# Patient Record
Sex: Female | Born: 1985
Health system: Southern US, Community
[De-identification: ages and names within clinical notes are randomized; demographics above are authoritative.]

## PROBLEM LIST (undated history)

## (undated) DIAGNOSIS — T7840XA Allergy, unspecified, initial encounter: Secondary | ICD-10-CM

## (undated) DIAGNOSIS — C801 Malignant (primary) neoplasm, unspecified: Secondary | ICD-10-CM

## (undated) DIAGNOSIS — M2550 Pain in unspecified joint: Secondary | ICD-10-CM

## (undated) DIAGNOSIS — G43109 Migraine with aura, not intractable, without status migrainosus: Secondary | ICD-10-CM

## (undated) DIAGNOSIS — A64 Unspecified sexually transmitted disease: Secondary | ICD-10-CM

## (undated) DIAGNOSIS — Z862 Personal history of diseases of the blood and blood-forming organs and certain disorders involving the immune mechanism: Secondary | ICD-10-CM

## (undated) DIAGNOSIS — F419 Anxiety disorder, unspecified: Secondary | ICD-10-CM

## (undated) DIAGNOSIS — Z973 Presence of spectacles and contact lenses: Secondary | ICD-10-CM

## (undated) DIAGNOSIS — D649 Anemia, unspecified: Secondary | ICD-10-CM

## (undated) DIAGNOSIS — K638219 Small intestinal bacterial overgrowth, unspecified: Secondary | ICD-10-CM

## (undated) DIAGNOSIS — R87619 Unspecified abnormal cytological findings in specimens from cervix uteri: Secondary | ICD-10-CM

## (undated) DIAGNOSIS — Z803 Family history of malignant neoplasm of breast: Secondary | ICD-10-CM

## (undated) DIAGNOSIS — R7989 Other specified abnormal findings of blood chemistry: Secondary | ICD-10-CM

## (undated) DIAGNOSIS — Q796 Ehlers-Danlos syndrome, unspecified: Secondary | ICD-10-CM

## (undated) DIAGNOSIS — N946 Dysmenorrhea, unspecified: Secondary | ICD-10-CM

## (undated) DIAGNOSIS — R7303 Prediabetes: Secondary | ICD-10-CM

## (undated) DIAGNOSIS — D72829 Elevated white blood cell count, unspecified: Secondary | ICD-10-CM

## (undated) DIAGNOSIS — F329 Major depressive disorder, single episode, unspecified: Secondary | ICD-10-CM

## (undated) DIAGNOSIS — F32A Depression, unspecified: Secondary | ICD-10-CM

## (undated) DIAGNOSIS — Z9189 Other specified personal risk factors, not elsewhere classified: Secondary | ICD-10-CM

## (undated) DIAGNOSIS — Z8051 Family history of malignant neoplasm of kidney: Secondary | ICD-10-CM

## (undated) DIAGNOSIS — D3910 Neoplasm of uncertain behavior of unspecified ovary: Secondary | ICD-10-CM

## (undated) DIAGNOSIS — S73191A Other sprain of right hip, initial encounter: Secondary | ICD-10-CM

## (undated) DIAGNOSIS — N83209 Unspecified ovarian cyst, unspecified side: Secondary | ICD-10-CM

## (undated) DIAGNOSIS — J45909 Unspecified asthma, uncomplicated: Secondary | ICD-10-CM

## (undated) HISTORY — DX: Anxiety disorder, unspecified: F41.9

## (undated) HISTORY — DX: Unspecified sexually transmitted disease: A64

## (undated) HISTORY — DX: Major depressive disorder, single episode, unspecified: F32.9

## (undated) HISTORY — DX: Family history of malignant neoplasm of breast: Z80.3

## (undated) HISTORY — DX: Allergy, unspecified, initial encounter: T78.40XA

## (undated) HISTORY — DX: Malignant (primary) neoplasm, unspecified: C80.1

## (undated) HISTORY — DX: Migraine with aura, not intractable, without status migrainosus: G43.109

## (undated) HISTORY — PX: ADENOIDECTOMY: SUR15

## (undated) HISTORY — DX: Ehlers-Danlos syndrome, unspecified: Q79.60

## (undated) HISTORY — PX: TONSILLECTOMY: SUR1361

## (undated) HISTORY — DX: Other specified abnormal findings of blood chemistry: R79.89

## (undated) HISTORY — DX: Family history of malignant neoplasm of kidney: Z80.51

## (undated) HISTORY — DX: Anemia, unspecified: D64.9

## (undated) HISTORY — DX: Depression, unspecified: F32.A

## (undated) HISTORY — PX: UPPER GI ENDOSCOPY: SHX6162

## (undated) HISTORY — DX: Unspecified abnormal cytological findings in specimens from cervix uteri: R87.619

## (undated) HISTORY — PX: COLPOSCOPY: SHX161

## (undated) HISTORY — DX: Neoplasm of uncertain behavior of unspecified ovary: D39.10

## (undated) HISTORY — DX: Small intestinal bacterial overgrowth, unspecified: K63.8219

## (undated) HISTORY — DX: Other sprain of right hip, initial encounter: S73.191A

## (undated) HISTORY — DX: Other specified personal risk factors, not elsewhere classified: Z91.89

---
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
## 2004-04-03 ENCOUNTER — Other Ambulatory Visit: Admission: RE | Admit: 2004-04-03 | Discharge: 2004-04-03 | Payer: Self-pay | Admitting: Obstetrics and Gynecology

## 2005-06-25 ENCOUNTER — Emergency Department (HOSPITAL_COMMUNITY): Admission: EM | Admit: 2005-06-25 | Discharge: 2005-06-25 | Payer: Self-pay | Admitting: Emergency Medicine

## 2005-07-15 ENCOUNTER — Other Ambulatory Visit: Admission: RE | Admit: 2005-07-15 | Discharge: 2005-07-15 | Payer: Self-pay | Admitting: Obstetrics and Gynecology

## 2006-09-19 ENCOUNTER — Other Ambulatory Visit: Admission: RE | Admit: 2006-09-19 | Discharge: 2006-09-19 | Payer: Self-pay | Admitting: Obstetrics & Gynecology

## 2007-06-13 ENCOUNTER — Encounter: Admission: RE | Admit: 2007-06-13 | Discharge: 2007-06-13 | Payer: Self-pay | Admitting: Gastroenterology

## 2007-06-14 ENCOUNTER — Encounter (INDEPENDENT_AMBULATORY_CARE_PROVIDER_SITE_OTHER): Payer: Self-pay | Admitting: Gastroenterology

## 2007-08-23 ENCOUNTER — Emergency Department (HOSPITAL_COMMUNITY): Admission: EM | Admit: 2007-08-23 | Discharge: 2007-08-23 | Payer: Self-pay | Admitting: Emergency Medicine

## 2007-09-25 ENCOUNTER — Other Ambulatory Visit: Admission: RE | Admit: 2007-09-25 | Discharge: 2007-09-25 | Payer: Self-pay | Admitting: Obstetrics and Gynecology

## 2007-11-27 ENCOUNTER — Other Ambulatory Visit: Admission: RE | Admit: 2007-11-27 | Discharge: 2007-11-27 | Payer: Self-pay | Admitting: Obstetrics and Gynecology

## 2008-11-26 ENCOUNTER — Other Ambulatory Visit: Admission: RE | Admit: 2008-11-26 | Discharge: 2008-11-26 | Payer: Self-pay | Admitting: Obstetrics and Gynecology

## 2012-01-03 DIAGNOSIS — J189 Pneumonia, unspecified organism: Secondary | ICD-10-CM

## 2012-01-03 HISTORY — DX: Pneumonia, unspecified organism: J18.9

## 2012-01-05 ENCOUNTER — Ambulatory Visit: Payer: Self-pay | Admitting: Emergency Medicine

## 2012-01-05 ENCOUNTER — Ambulatory Visit: Payer: Self-pay

## 2012-01-05 VITALS — BP 106/61 | HR 94 | Temp 99.6°F | Resp 16 | Ht 68.0 in | Wt 137.0 lb

## 2012-01-05 DIAGNOSIS — R509 Fever, unspecified: Secondary | ICD-10-CM

## 2012-01-05 DIAGNOSIS — R059 Cough, unspecified: Secondary | ICD-10-CM

## 2012-01-05 DIAGNOSIS — R05 Cough: Secondary | ICD-10-CM

## 2012-01-05 DIAGNOSIS — J189 Pneumonia, unspecified organism: Secondary | ICD-10-CM

## 2012-01-05 DIAGNOSIS — J158 Pneumonia due to other specified bacteria: Secondary | ICD-10-CM

## 2012-01-05 LAB — POCT CBC
Granulocyte percent: 75.5 %G (ref 37–80)
HCT, POC: 35.4 % — AB (ref 37.7–47.9)
Hemoglobin: 11.8 g/dL — AB (ref 12.2–16.2)
Lymph, poc: 4.5 — AB (ref 0.6–3.4)
MCH, POC: 28.4 pg (ref 27–31.2)
MCHC: 33.3 g/dL (ref 31.8–35.4)
MCV: 85 fL (ref 80–97)
MID (cbc): 2.1 — AB (ref 0–0.9)
MPV: 9 fL (ref 0–99.8)
POC Granulocyte: 20.5 — AB (ref 2–6.9)
POC LYMPH PERCENT: 16.6 %L (ref 10–50)
POC MID %: 7.9 %M (ref 0–12)
Platelet Count, POC: 234 10*3/uL (ref 142–424)
RBC: 4.16 M/uL (ref 4.04–5.48)
RDW, POC: 13.9 %
WBC: 27.1 10*3/uL — AB (ref 4.6–10.2)

## 2012-01-05 MED ORDER — CEFTRIAXONE SODIUM 1 G IJ SOLR: 1.0000 g | Freq: Once | INTRAMUSCULAR | Status: AC

## 2012-01-05 MED ORDER — LEVOFLOXACIN 750 MG PO TABS: 750.0000 mg | ORAL_TABLET | Freq: Every day | ORAL | Status: AC

## 2012-01-05 NOTE — Progress Notes (Signed)
  Subjective:    Patient ID: Kathryn Park, female    DOB: 12-26-1985, 26 y.o.   MRN: [id number]  HPI 26 y/o WF presents with two week history of productive cough.  Now with fever Tmax 101F today.  Feels achy, tired.  Pain in left rib cage when breathing, but without dyspnea or wheeze.  No other URI symptoms.  Recent airplane travel to [place] for work.  Denies leg pain, swelling or redness.  Not on OCPs.  No FHx of DVT or clotting abnormality.     Review of Systems  Constitutional: Positive for fever and chills.  HENT: Positive for ear pain (fullness). Negative for nosebleeds, congestion and rhinorrhea.   Respiratory: Positive for cough. Negative for shortness of breath and wheezing.   Cardiovascular: Positive for chest pain (left posterior ribs when breathing).  Musculoskeletal: Negative.   Neurological: Negative.   Hematological: Negative.        Objective:   Physical Exam  Constitutional:       Appears acutely ill, skin warm, febrile  HENT:  Head: Normocephalic and atraumatic.  Right Ear: External ear normal.  Left Ear: External ear normal.  Mouth/Throat: Oropharynx is clear and moist.  Eyes: Pupils are equal, round, and reactive to light.  Neck: Normal range of motion. Neck supple.  Cardiovascular: Regular rhythm.   Pulmonary/Chest: Effort normal. No respiratory distress. She has no wheezes. She has rales (LLL).  Lymphadenopathy:    She has cervical adenopathy (shotty PC lymphadenopathy).    Results for orders placed in visit on 01/05/12  POCT CBC      Component Value Range   WBC 27.1 (*) 4.6 - 10.2 (K/uL)   Lymph, poc 4.5 (*) 0.6 - 3.4    POC LYMPH PERCENT 16.6  10 - 50 (%L)   MID (cbc) 2.1 (*) 0 - 0.9    POC MID % 7.9  0 - 12 (%M)   POC Granulocyte 20.5 (*) 2 - 6.9    Granulocyte percent 75.5  37 - 80 (%G)   RBC 4.16  4.04 - 5.48 (M/uL)   Hemoglobin 11.8 (*) 12.2 - 16.2 (g/dL)   HCT, POC 40.9 (*) 81.1 - 47.9 (%)   MCV 85.0  80 - 97 (fL)   MCH, POC 28.4  27 -  31.2 (pg)   MCHC 33.3  31.8 - 35.4 (g/dL)   RDW, POC 91.4     Platelet Count, POC 234  142 - 424 (K/uL)   MPV 9.0  0 - 99.8 (fL)   [facility] reading (PRIMARY) by  Dr Cleta Alberts [facility] Infiltrate       Assessment & Plan:  Cough.  Fever.  Myalgias.  Leukocytosis. Likely pneumococcal pneumonia.  Seen and discussed with Dr. Cleta Alberts. Treat with rocephin 1g IM now.  Levaquin 750mg  daily for seven days.  Return to clinic in 48 hours for recheck with Dr. Cleta Alberts, sooner if symptoms worsen.

## 2012-01-05 NOTE — Patient Instructions (Signed)

## 2012-01-07 ENCOUNTER — Ambulatory Visit: Payer: Self-pay | Admitting: Emergency Medicine

## 2012-01-07 VITALS — BP 114/73 | HR 67 | Temp 97.9°F | Resp 16 | Ht 65.5 in | Wt 138.2 lb

## 2012-01-07 DIAGNOSIS — J189 Pneumonia, unspecified organism: Secondary | ICD-10-CM

## 2012-01-07 NOTE — Progress Notes (Signed)
  Subjective:    Patient ID: Kathryn Park, female    DOB: 05/11/1986, 26 y.o.   MRN: [id number]  HPI she had followup pneumonia of her left lower lobe. A white count was significantly elevated. Her chest x-ray showed a consolidated left lower lobe pneumonia. She's feeling better. She has decreased the color of the sputum. She is not running fever today.    Review of Systems     Objective:   Physical Exam physical exam neck was supple chest exam reveals a few faint rales in the left base but much improved from 2 days ago. Cardiac exam is unremarkable.        Assessment & Plan:  Assessment is lateral pneumonia improved after Rocephin and Levaquin. We'll recheck x-ray in 10-14 days.

## 2013-06-18 ENCOUNTER — Emergency Department (INDEPENDENT_AMBULATORY_CARE_PROVIDER_SITE_OTHER)
Admission: EM | Admit: 2013-06-18 | Discharge: 2013-06-18 | Disposition: A | Discharge status: Home / Self Care | Payer: Self-pay | Source: Home / Self Care | Attending: Family Medicine | Admitting: Family Medicine

## 2013-06-18 ENCOUNTER — Encounter (HOSPITAL_COMMUNITY): Payer: Self-pay | Admitting: Emergency Medicine

## 2013-06-18 DIAGNOSIS — B009 Herpesviral infection, unspecified: Secondary | ICD-10-CM

## 2013-06-18 DIAGNOSIS — B001 Herpesviral vesicular dermatitis: Secondary | ICD-10-CM

## 2013-06-18 MED ORDER — VALACYCLOVIR HCL 1 G PO TABS: ORAL_TABLET | ORAL | Status: DC

## 2013-06-18 NOTE — ED Notes (Signed)
C/o recurrent cold sores over the past month. States this is the third outbreak. Pt has tried abreva with no relief in symptoms.

## 2013-06-18 NOTE — ED Provider Notes (Signed)
CSN: [id number]     Arrival date & time 06/18/13  0912 History   First MD Initiated Contact with Patient 06/18/13 [phone]     Chief Complaint  Patient presents with  . Mouth Lesions    recurrent over the past month. abreeva not working.    (Consider location/radiation/quality/duration/timing/severity/associated sxs/prior Treatment) HPI Comments: 27 year old female presents complaining of recurrent cold sores over the past 2 months. She has a long history of cold sores that she usually treats by taking oral lysine and by covering the sore with Abreva. She has been doing this each time. She usually has this only one time yearly, but she has had 3 times over the past 2 months and has just another one pop up this morning. She is a small blister on her upper lip and she feels all her facial nerves tingling. She states this is always the beginning of what leads to a cold sore. She states these are no different from a cold sore she has ever had in the past. In the past, she is taking Valtrex which was helpful. She says she is here specifically because she has a cold sore, but specifically because she keeps having recurrent cold sores. She's never had this happen in the past. She denies any other symptoms apart from a cold sores.  Patient is a 27 y.o. female presenting with mouth sores.  Mouth Lesions Associated symptoms: no fever and no rash     History reviewed. No pertinent past medical history. History reviewed. No pertinent past surgical history. History reviewed. No pertinent family history. History  Substance Use Topics  . Smoking status: Current Some Day Smoker -- 1.00 packs/day    Types: Cigarettes  . Smokeless tobacco: Not on file  . Alcohol Use: Yes   OB History   Grav Para Term Preterm Abortions TAB SAB Ect Mult Living                 Review of Systems  Constitutional: Negative for fever and chills.  HENT: Positive for mouth sores.   Eyes: Negative for visual disturbance.    Respiratory: Negative for cough and shortness of breath.   Cardiovascular: Negative for chest pain, palpitations and leg swelling.  Gastrointestinal: Negative for nausea, vomiting and abdominal pain.  Endocrine: Negative for polydipsia and polyuria.  Genitourinary: Negative for dysuria, urgency and frequency.  Musculoskeletal: Negative for myalgias and arthralgias.  Skin: Negative for rash.  Neurological: Negative for dizziness, weakness and light-headedness.    Allergies  Latex and Sulfa antibiotics  Home Medications   Current Outpatient Rx  Name  Route  Sig  Dispense  Refill  . valACYclovir (VALTREX) 1000 MG tablet      1 tab PO TID for 7 days, then 1 tab PO QD for suppression   42 tablet   0    BP 117/75  Pulse 68  Temp(Src) 98.2 F (36.8 C)  Resp 16  SpO2 100%  LMP 06/16/2013 Physical Exam  Nursing note and vitals reviewed. Constitutional: She is oriented to person, place, and time. Vital signs are normal. She appears well-developed and well-nourished. No distress.  HENT:  Head: Normocephalic and atraumatic.  Mouth/Throat:    Pulmonary/Chest: Effort normal. No respiratory distress.  Neurological: She is alert and oriented to person, place, and time. She has normal strength. Coordination normal.  Skin: Skin is warm and dry. No rash noted. She is not diaphoretic.  Psychiatric: She has a normal mood and affect. Judgment normal.  ED Course  Procedures (including critical care time) Labs Review Labs Reviewed - No data to display Imaging Review No results found.  MDM   1. Herpes simplex labialis    Treating this with Valtrex 3 times a day, then continue Valtrex for 3 more weeks once daily for suppression. Continue to use Abreva and lysine. If this does not result in resolution of her recurrent cold sores, she will followup with her primary care doctor for evaluation, possible immune deficiency.   Meds ordered this encounter  Medications  . valACYclovir  (VALTREX) 1000 MG tablet    Sig: 1 tab PO TID for 7 days, then 1 tab PO QD for suppression    Dispense:  42 tablet    Refill:  0       Graylon Good, PA-C 06/18/13 1045

## 2013-06-19 NOTE — ED Provider Notes (Signed)
Medical screening examination/treatment/procedure(s) were performed by a resident physician or non-physician practitioner and as the supervising physician I was immediately available for consultation/collaboration.  Evan Corey, MD    Evan S Corey, MD 06/19/13 1335 

## 2013-10-04 DIAGNOSIS — R7303 Prediabetes: Secondary | ICD-10-CM

## 2013-10-04 HISTORY — DX: Prediabetes: R73.03

## 2018-03-28 ENCOUNTER — Other Ambulatory Visit (HOSPITAL_COMMUNITY)
Admission: RE | Admit: 2018-03-28 | Discharge: 2018-03-28 | Disposition: A | Discharge status: Home / Self Care | Payer: No Typology Code available for payment source | Source: Ambulatory Visit | Attending: Obstetrics and Gynecology | Admitting: Obstetrics and Gynecology

## 2018-03-28 ENCOUNTER — Ambulatory Visit: Payer: No Typology Code available for payment source | Admitting: Obstetrics and Gynecology

## 2018-03-28 ENCOUNTER — Other Ambulatory Visit: Payer: Self-pay

## 2018-03-28 ENCOUNTER — Encounter: Payer: Self-pay | Admitting: Obstetrics and Gynecology

## 2018-03-28 VITALS — BP 120/68 | HR 70 | Resp 16 | Ht 67.25 in | Wt 121.0 lb

## 2018-03-28 DIAGNOSIS — Z832 Family history of diseases of the blood and blood-forming organs and certain disorders involving the immune mechanism: Secondary | ICD-10-CM | POA: Diagnosis not present

## 2018-03-28 DIAGNOSIS — Z01419 Encounter for gynecological examination (general) (routine) without abnormal findings: Secondary | ICD-10-CM | POA: Diagnosis not present

## 2018-03-28 DIAGNOSIS — N6310 Unspecified lump in the right breast, unspecified quadrant: Secondary | ICD-10-CM

## 2018-03-28 MED ORDER — VALACYCLOVIR HCL 1 G PO TABS: ORAL_TABLET | ORAL | 1 refills | Status: DC

## 2018-03-28 NOTE — Progress Notes (Signed)
32 y.o. G15P0000 Married Caucasian female here for annual exam.    Lump in her right breast for one week. No pain where the lump is but has pain on the other side of the same breast.  Hx right breast cyst since 2014, but this is in a different location.   Felt sad when she took OCPs in the past.   Lives in [place] for 3 years.   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   PCP:   None.   Patient's last menstrual period was 03/04/2018 (exact date).           Sexually active: Yes.    The current method of family planning is condoms all the time.  Satisfied with this.  Exercising: Yes.    yoga Smoker:  vapes  Health Maintenance: Pap:  2015 neg? History of abnormal Pap:  In the past.  Biopsy done and thinks it was maybe low grade. Follow ups normal.   MMG:  none Colonoscopy:  none BMD:   n/a  Result  n/a TDaP:  2017 Gardasil:   yes HIV: not done Hep C: not done Screening Labs: ---   reports that she has quit smoking. Her smoking use included cigarettes. She smoked 1.00 pack per day. She has never used smokeless tobacco. She reports that she drinks about 2.4 oz of alcohol per week. She reports that she does not use drugs.  Past Medical History:  Diagnosis Date  . Abnormal Pap smear of cervix   . Anxiety   . Depression   . STD (sexually transmitted disease)    HSV1    Past Surgical History:  Procedure Laterality Date  . COLPOSCOPY    . TONSILLECTOMY      Current Outpatient Medications  Medication Sig Dispense Refill  . valACYclovir (VALTREX) 1000 MG tablet 1 tab PO TID for 7 days, then 1 tab PO QD for suppression 42 tablet 0   No current facility-administered medications for this visit.     Family History  Problem Relation Age of Onset  . Diabetes Mother   . Hypertension Mother   . Stroke Mother        TIA  . Factor V Leiden deficiency Mother   . Other Father        kidney tumor  . Cancer Father        cancerous tumor  . Crohn's disease  Father   . Breast cancer Maternal Grandmother   . Stroke Maternal Grandfather     Review of Systems  Constitutional: Negative.   HENT: Negative.   Eyes: Negative.   Respiratory: Negative.   Cardiovascular: Negative.   Gastrointestinal: Negative.   Endocrine: Negative.   Genitourinary: Negative.   Musculoskeletal: Negative.   Skin:       Breast lump rt side  Allergic/Immunologic: Negative.   Neurological: Negative.   Hematological: Negative.   Psychiatric/Behavioral: Negative.     Exam:   BP 120/68   Pulse 70   Resp 16   Ht 5' 7.25" (1.708 m)   Wt 121 lb (54.9 kg)   LMP 03/04/2018 (Exact Date)   BMI 18.81 kg/m     General appearance: alert, cooperative and appears stated age Head: Normocephalic, without obvious abnormality, atraumatic Neck: no adenopathy, supple, symmetrical, trachea midline and thyroid normal to inspection and palpation Lungs: clear to auscultation bilaterally Breasts: normal appearance, no masses or tenderness on left.  Questionable right breast mass at 10:00 1.5 cm, mobile, No nipple retraction  or dimpling, No nipple discharge or bleeding, No axillary or supraclavicular adenopathy Heart: regular rate and rhythm Abdomen: soft, non-tender; no masses, no organomegaly Extremities: extremities normal, atraumatic, no cyanosis or edema Skin: Skin color, texture, turgor normal. No rashes or lesions Lymph nodes: Cervical, supraclavicular, and axillary nodes normal. No abnormal inguinal nodes palpated Neurologic: Grossly normal  Pelvic: External genitalia:  no lesions              Urethra:  normal appearing urethra with no masses, tenderness or lesions              Bartholins and Skenes: normal                 Vagina: normal appearing vagina with normal color and discharge, no lesions              Cervix: no lesions              Pap taken: Yes.   Bimanual Exam:  Uterus:  normal size, contour, position, consistency, mobility, non-tender              Adnexa:  no mass, fullness, tenderness             Chaperone was present for exam.  Assessment:   Well woman visit with normal exam. Questionable right breat lump. Hx abnormal pap in remote past.  HSV 1. Mother with Factor V Leiden.  Patient has not been tested.   Plan: Mammogram bilateral dx and right breast US.  Recommended self breast awareness. Pap and HR HPV as above. Guidelines for Calcium, Vitamin D, regular exercise program including cardiovascular and weight bearing exercise. Routine labs.  Refill of Valtrex.  Referral to see Hematology for Factor V Leiden testing.  Follow up annually and prn.   After visit summary provided.

## 2018-03-28 NOTE — Patient Instructions (Signed)

## 2018-03-28 NOTE — Progress Notes (Signed)
Scheduled patient while in office for bilateral diagnostic mammogram and [facility] at 1 pm. Patient is agreeable to date and time. Placed in mammogram hold.

## 2018-03-29 ENCOUNTER — Telehealth: Payer: Self-pay | Admitting: *Deleted

## 2018-03-29 ENCOUNTER — Ambulatory Visit
Admission: RE | Admit: 2018-03-29 | Discharge: 2018-03-29 | Disposition: A | Discharge status: Home / Self Care | Payer: No Typology Code available for payment source | Source: Ambulatory Visit | Attending: Obstetrics and Gynecology | Admitting: Obstetrics and Gynecology

## 2018-03-29 ENCOUNTER — Other Ambulatory Visit: Payer: Self-pay | Admitting: Obstetrics and Gynecology

## 2018-03-29 DIAGNOSIS — N6310 Unspecified lump in the right breast, unspecified quadrant: Secondary | ICD-10-CM

## 2018-03-29 DIAGNOSIS — R928 Other abnormal and inconclusive findings on diagnostic imaging of breast: Secondary | ICD-10-CM

## 2018-03-29 HISTORY — DX: Elevated white blood cell count, unspecified: D72.829

## 2018-03-29 LAB — COMPREHENSIVE METABOLIC PANEL
ALT: 9 IU/L (ref 0–32)
AST: 16 IU/L (ref 0–40)
Albumin/Globulin Ratio: 2.2 (ref 1.2–2.2)
Albumin: 4.6 g/dL (ref 3.5–5.5)
Alkaline Phosphatase: 47 IU/L (ref 39–117)
BUN/Creatinine Ratio: 23 (ref 9–23)
BUN: 15 mg/dL (ref 6–20)
Bilirubin Total: 0.2 mg/dL (ref 0.0–1.2)
CO2: 20 mmol/L (ref 20–29)
Calcium: 9.2 mg/dL (ref 8.7–10.2)
Chloride: 106 mmol/L (ref 96–106)
Creatinine, Ser: 0.65 mg/dL (ref 0.57–1.00)
GFR calc Af Amer: 136 mL/min/{1.73_m2} (ref >59)
GFR calc non Af Amer: 118 mL/min/{1.73_m2} (ref >59)
Globulin, Total: 2.1 g/dL (ref 1.5–4.5)
Glucose: 96 mg/dL (ref 65–99)
Potassium: 4.6 mmol/L (ref 3.5–5.2)
Sodium: 138 mmol/L (ref 134–144)
Total Protein: 6.7 g/dL (ref 6.0–8.5)

## 2018-03-29 LAB — LIPID PANEL
Chol/HDL Ratio: 2 ratio (ref 0.0–4.4)
Cholesterol, Total: 138 mg/dL (ref 100–199)
HDL: 69 mg/dL (ref >39)
LDL Calculated: 51 mg/dL (ref 0–99)
Triglycerides: 88 mg/dL (ref 0–149)
VLDL Cholesterol Cal: 18 mg/dL (ref 5–40)

## 2018-03-29 LAB — CBC
Hematocrit: 40.2 % (ref 34.0–46.6)
Hemoglobin: 13.7 g/dL (ref 11.1–15.9)
MCH: 29.7 pg (ref 26.6–33.0)
MCHC: 34.1 g/dL (ref 31.5–35.7)
MCV: 87 fL (ref 79–97)
Platelets: 222 10*3/uL (ref 150–450)
RBC: 4.61 x10E6/uL (ref 3.77–5.28)
RDW: 14.1 % (ref 12.3–15.4)
WBC: 11.2 10*3/uL — ABNORMAL HIGH (ref 3.4–10.8)

## 2018-03-29 NOTE — Telephone Encounter (Signed)
-----   Message from Nunzio Cobbs, MD sent at 03/29/2018  5:21 PM EDT ----- Please report results of testing to patient.  She had mildly elevated white blood cells.  This was quite elevated many years ago on chart review, and I am uncertain of the reason.   Otherwise, her blood work show normal blood chemistries and cholesterol. I have placed a referral for the patient to see a hematologist for her FH of factor V Leiden mutation.  At the same time, the hematologist can recheck her CBC.   Her pap is pending.

## 2018-03-29 NOTE — Telephone Encounter (Signed)
Call to patient to review lab results. Per ROI can leave message on voice mail. Call log confirms correct number. Left message calling to review results. Nothing wrong, no emergency. Call in am and ask for triage nurse.

## 2018-03-30 LAB — CYTOLOGY - PAP
Diagnosis: NEGATIVE
HPV: NOT DETECTED

## 2018-03-30 NOTE — Telephone Encounter (Signed)
Call to patient. Advised of test results as directed by Dr Quincy Simmonds. Hematology referral is in place. Advised to call back if no appointment info received in one week.  Patient reports that breast ultrasound revealed inflamed lymph node and perhaps this is related to elevated WBC. Advised will update Dr Quincy Simmonds. Proceed with referral as planned.   Routing to provider for final review.  Will close encounter.

## 2018-03-30 NOTE — Telephone Encounter (Signed)
Patient returning call for triage nurse.

## 2018-04-10 ENCOUNTER — Telehealth: Payer: Self-pay | Admitting: Obstetrics and Gynecology

## 2018-04-10 DIAGNOSIS — Z832 Family history of diseases of the blood and blood-forming organs and certain disorders involving the immune mechanism: Secondary | ICD-10-CM

## 2018-04-10 NOTE — Telephone Encounter (Signed)
Left message to call Jaevin Medearis at [phone].  

## 2018-04-10 NOTE — Telephone Encounter (Signed)
Spoke with patient, advised as seen below per Dr. Quincy Simmonds. Patient request to return to [facility] for labs, has not established new PCP in [place]. Lab appt scheduled for 04/17/18 at 11:30am. Patient verbalizes understanding and is agreeable.   Future lab order placed for Factor V Leiden and pended.   Dr. Quincy Simmonds -please review pended lab.

## 2018-04-10 NOTE — Telephone Encounter (Signed)
Ok for testing in our office for Factor V Leiden. Please send through the order, and I will sign it.

## 2018-04-10 NOTE — Telephone Encounter (Signed)
Please let patient know that Dr. Marin Olp from Hematology/Oncology is recommending she have Factor V Leiden testing done through her PCP.  I am happy to have [place] do it here, but she lives in [place].  She may wish to have a PCP do it there.  If she tests positive, then a consultation with the Hematologist/Oncologist would be indicated.

## 2018-04-11 NOTE — Telephone Encounter (Signed)
Lab order placed. Encounter closed.

## 2018-04-17 ENCOUNTER — Other Ambulatory Visit (INDEPENDENT_AMBULATORY_CARE_PROVIDER_SITE_OTHER): Payer: No Typology Code available for payment source

## 2018-04-17 DIAGNOSIS — Z832 Family history of diseases of the blood and blood-forming organs and certain disorders involving the immune mechanism: Secondary | ICD-10-CM

## 2018-04-21 LAB — FACTOR 5 LEIDEN

## 2018-04-23 ENCOUNTER — Encounter: Payer: Self-pay | Admitting: Obstetrics and Gynecology

## 2018-05-11 ENCOUNTER — Telehealth: Payer: Self-pay | Admitting: Obstetrics and Gynecology

## 2018-05-11 NOTE — Telephone Encounter (Signed)
Left message for patient to reschedule appointment.

## 2018-10-10 ENCOUNTER — Ambulatory Visit
Admission: RE | Admit: 2018-10-10 | Discharge: 2018-10-10 | Disposition: A | Discharge status: Home / Self Care | Payer: No Typology Code available for payment source | Source: Ambulatory Visit | Attending: Obstetrics and Gynecology | Admitting: Obstetrics and Gynecology

## 2018-10-10 DIAGNOSIS — R928 Other abnormal and inconclusive findings on diagnostic imaging of breast: Secondary | ICD-10-CM

## 2019-04-12 ENCOUNTER — Ambulatory Visit: Payer: No Typology Code available for payment source | Admitting: Obstetrics and Gynecology

## 2019-08-28 ENCOUNTER — Telehealth: Payer: Self-pay

## 2019-08-28 NOTE — Telephone Encounter (Signed)
Please contact patient to schedule 6 month breast imaging. Recall date of 04/08/19.

## 2019-09-12 NOTE — Telephone Encounter (Signed)
Left message to call Enslee Bibbins, CMA. °

## 2019-09-20 ENCOUNTER — Other Ambulatory Visit: Payer: Self-pay | Admitting: Obstetrics and Gynecology

## 2019-09-20 DIAGNOSIS — N6489 Other specified disorders of breast: Secondary | ICD-10-CM

## 2019-09-20 NOTE — Telephone Encounter (Signed)
Spoke with patient, she will call today to schedule follow up MMG. She states she hasn't had insurance, but will have new policy after [id number]. She is having other health issues and would like to see Dr.Silva the same day of MMG since she lives in [place], [place]. She will call back after has date of MMG and we will try to schedule appt.with Dr.Silva for the same day.

## 2019-09-20 NOTE — Telephone Encounter (Signed)
Patient returned call

## 2019-09-21 NOTE — Telephone Encounter (Signed)
Per review of Epic, patient is scheduled for Dx breast imaging on 10/11/19 at 11am.   Call to patient, AEX scheduled for 10/11/19 at 9:30am with Dr. Quincy Simmonds. [id number] precautions reviewed. Patient verbalizes understanding and is agreeable.   Routing to provider for final review. Patient is agreeable to disposition. Will close encounter.

## 2019-09-21 NOTE — Telephone Encounter (Signed)
Orders faxed to [facility].

## 2019-09-21 NOTE — Telephone Encounter (Signed)
Please contact patient in follow up to her breast imaging order I just signed.   She also needs to schedule an annual exam with me.

## 2019-09-26 NOTE — Telephone Encounter (Signed)
Moved from current recall to 04/2019 MMG recall.BS/TF/ald

## 2019-10-05 DIAGNOSIS — C569 Malignant neoplasm of unspecified ovary: Secondary | ICD-10-CM

## 2019-10-05 HISTORY — DX: Malignant neoplasm of unspecified ovary: C56.9

## 2019-10-10 ENCOUNTER — Other Ambulatory Visit: Payer: Self-pay

## 2019-10-10 NOTE — Progress Notes (Signed)
34 y.o. G0P0000 Married [place] female here for annual exam.    She has noticed some lymph nodes in her groin.  She is having joint pain.  Lost hair.  Dry skin, mouth, and eye. Having mood swings and feeling very low, like she felt on birth control.  Not have HSV oral outbreaks.   Menses are regular but have changed.  Bleeds for a total of 6 days.  Has one day of debilitating cramps. She vomits.  Use heating pad.  Ibuprofen helps some.  Denies painful intercourse.  Not working for the last year.   Mom passed 01/2019 from brain bleed.  Patient lives in [place].   PCP: None  Patient's last menstrual period was 10/02/2019 (exact date).           Sexually active: Yes.    The current method of family planning is condoms everytime.    Exercising: Yes.    yoga Smoker:  no  Health Maintenance: Pap: 03-28-18 Neg:Neg HR HPV, 2015 ?Neg History of abnormal Pap:  Yes.in her early 20's had colposcopy but no treatment. MMG: 10-10-18 Rt.Diag./density D/stable benign-appearing Rt.Br.asymmetry./Neg/rec.Bil.Diag in 6 mo.to demonstrate 1 yr.stability/BiRads3--appt.10-11-19 Colonoscopy:  n/a BMD:   n/a  Result  n/a TDaP:  2017 Gardasil:   yes HIV:no Hep C:no Screening Labs:  Today.  Flu vaccine:  Completed.   reports that she has quit smoking. Her smoking use included cigarettes. She smoked 1.00 pack per day. She has never used smokeless tobacco. She reports previous alcohol use. She reports that she does not use drugs.  Past Medical History:  Diagnosis Date  . Abnormal Pap smear of cervix   . Anxiety   . Depression   . Leukocytosis   . STD (sexually transmitted disease)    HSV1    Past Surgical History:  Procedure Laterality Date  . COLPOSCOPY    . TONSILLECTOMY      Current Outpatient Medications  Medication Sig Dispense Refill  . valACYclovir (VALTREX) 1000 MG tablet Take 2 tablets (2000 mg) by mouth twice a day for 24 hours. 30 tablet 1   No current facility-administered  medications for this visit.    Family History  Problem Relation Age of Onset  . Diabetes Mother   . Hypertension Mother   . Stroke Mother        TIA  . Factor V Leiden deficiency Mother   . Other Father        kidney tumor  . Cancer Father        cancerous tumor of kidney  . Crohn's disease Father   . Breast cancer Maternal Grandmother   . Stroke Maternal Grandfather     Review of Systems  Constitutional:       Hair loss Increased acne on face, chest and back  Eyes:       Dry eyes  Gastrointestinal: Positive for constipation.       Bloating  Genitourinary:       Slow to start stream to urinate  Musculoskeletal: Positive for arthralgias.       Joint aches  Skin:       Dry skin    Exam:   BP 100/60   Pulse 66   Temp (!) 97.1 F (36.2 C) (Temporal)   Resp 20   Ht 5\' 7"  (1.702 m)   Wt 133 lb 9.6 oz (60.6 kg)   LMP 10/02/2019 (Exact Date)   BMI 20.92 kg/m     General appearance: alert, cooperative and appears  stated age Head: normocephalic, without obvious abnormality, atraumatic Neck: no adenopathy, supple, symmetrical, trachea midline and thyroid normal to inspection and palpation Lungs: clear to auscultation bilaterally Breasts: normal appearance, no masses or tenderness, No nipple retraction or dimpling, No nipple discharge or bleeding, No axillary adenopathy Heart: regular rate and rhythm Abdomen: soft, non-tender; no masses, no organomegaly Extremities: extremities normal, atraumatic, no cyanosis or edema Skin: skin color, texture, turgor normal. No rashes or lesions Lymph nodes: cervical, supraclavicular, and axillary nodes normal. Neurologic: grossly normal Inguinal nodes:  Bilateral node on each side about 7 mm, nontender.  Pelvic: External genitalia:  no lesions              No abnormal inguinal nodes palpated.              Urethra:  normal appearing urethra with no masses, tenderness or lesions              Bartholins and Skenes: normal                  Vagina: normal appearing vagina with normal color and discharge, no lesions              Cervix: no lesions              Pap taken: No. Bimanual Exam:  Uterus:  normal size, contour, position, consistency, mobility, non-tender              Adnexa: no mass, fullness, tenderness on right.  Mass in cul de sac, feels heavy, tender to touch.                 Chaperone was present for exam.  Assessment:   Well woman visit with normal exam. Malaise.  Dysmenorrhea.  Pelvic mass. Hx HSV.  FH Factor V Leiden.  Patient is negative. Bereavement.   Plan: Mammogram today. Self breast awareness reviewed. Pap and HR HPV as above. Guidelines for Calcium, Vitamin D, regular exercise program including cardiovascular and weight bearing exercise. Routine comprehensive labs.  Return for pelvic US.  We did discuss ovarian cysts and fibroids as a cause for a pelvic mass. Support give for the loss of the patient's mother.  We discussed Hospice a resource for her. Follow up annually and prn.   After visit summary provided.

## 2019-10-11 ENCOUNTER — Ambulatory Visit: Payer: BC Managed Care – PPO | Admitting: Obstetrics and Gynecology

## 2019-10-11 ENCOUNTER — Ambulatory Visit
Admission: RE | Admit: 2019-10-11 | Discharge: 2019-10-11 | Disposition: A | Discharge status: Home / Self Care | Payer: BC Managed Care – PPO | Source: Ambulatory Visit | Attending: Obstetrics and Gynecology | Admitting: Obstetrics and Gynecology

## 2019-10-11 ENCOUNTER — Ambulatory Visit: Payer: Self-pay

## 2019-10-11 ENCOUNTER — Encounter: Payer: Self-pay | Admitting: Obstetrics and Gynecology

## 2019-10-11 ENCOUNTER — Telehealth: Payer: Self-pay | Admitting: Obstetrics and Gynecology

## 2019-10-11 VITALS — BP 100/60 | HR 66 | Temp 97.1°F | Resp 20 | Ht 67.0 in | Wt 133.6 lb

## 2019-10-11 DIAGNOSIS — N946 Dysmenorrhea, unspecified: Secondary | ICD-10-CM

## 2019-10-11 DIAGNOSIS — N6489 Other specified disorders of breast: Secondary | ICD-10-CM

## 2019-10-11 DIAGNOSIS — Z01419 Encounter for gynecological examination (general) (routine) without abnormal findings: Secondary | ICD-10-CM | POA: Diagnosis not present

## 2019-10-11 DIAGNOSIS — R1900 Intra-abdominal and pelvic swelling, mass and lump, unspecified site: Secondary | ICD-10-CM | POA: Diagnosis not present

## 2019-10-11 NOTE — Telephone Encounter (Signed)
Call placed to patient to review benefit and schedule recommended ultrasound with Dr Quincy Simmonds. Left voicemail message requesting a return call.

## 2019-10-11 NOTE — Telephone Encounter (Signed)
Patient returned call. Reviewed benefit for recommended ultrasound. Patient acknowledges understanding of information presented. Patient is scheduled 10/18/2019 with Dr Quincy Simmonds. Patient is aware of appointment date, arrival time and cancellation policy. No further questions. Will close encounter

## 2019-10-11 NOTE — Patient Instructions (Signed)

## 2019-10-12 LAB — CBC WITH DIFFERENTIAL/PLATELET
Basophils Absolute: 0 10*3/uL (ref 0.0–0.2)
Basos: 1 %
EOS (ABSOLUTE): 0.2 10*3/uL (ref 0.0–0.4)
Eos: 2 %
Hematocrit: 41.2 % (ref 34.0–46.6)
Hemoglobin: 14.2 g/dL (ref 11.1–15.9)
Immature Grans (Abs): 0 10*3/uL (ref 0.0–0.1)
Immature Granulocytes: 0 %
Lymphocytes Absolute: 2.7 10*3/uL (ref 0.7–3.1)
Lymphs: 42 %
MCH: 29.8 pg (ref 26.6–33.0)
MCHC: 34.5 g/dL (ref 31.5–35.7)
MCV: 86 fL (ref 79–97)
Monocytes Absolute: 0.5 10*3/uL (ref 0.1–0.9)
Monocytes: 7 %
Neutrophils Absolute: 3 10*3/uL (ref 1.4–7.0)
Neutrophils: 48 %
Platelets: 232 10*3/uL (ref 150–450)
RBC: 4.77 x10E6/uL (ref 3.77–5.28)
RDW: 11.6 % — ABNORMAL LOW (ref 11.7–15.4)
WBC: 6.3 10*3/uL (ref 3.4–10.8)

## 2019-10-12 LAB — COMPREHENSIVE METABOLIC PANEL
ALT: 9 IU/L (ref 0–32)
AST: 11 IU/L (ref 0–40)
Albumin/Globulin Ratio: 2.1 (ref 1.2–2.2)
Albumin: 4.6 g/dL (ref 3.8–4.8)
Alkaline Phosphatase: 54 IU/L (ref 39–117)
BUN/Creatinine Ratio: 14 (ref 9–23)
BUN: 12 mg/dL (ref 6–20)
Bilirubin Total: 0.2 mg/dL (ref 0.0–1.2)
CO2: 23 mmol/L (ref 20–29)
Calcium: 9.2 mg/dL (ref 8.7–10.2)
Chloride: 103 mmol/L (ref 96–106)
Creatinine, Ser: 0.83 mg/dL (ref 0.57–1.00)
GFR calc Af Amer: 106 mL/min/{1.73_m2} (ref >59)
GFR calc non Af Amer: 92 mL/min/{1.73_m2} (ref >59)
Globulin, Total: 2.2 g/dL (ref 1.5–4.5)
Glucose: 93 mg/dL (ref 65–99)
Potassium: 4.4 mmol/L (ref 3.5–5.2)
Sodium: 139 mmol/L (ref 134–144)
Total Protein: 6.8 g/dL (ref 6.0–8.5)

## 2019-10-12 LAB — LIPID PANEL
Chol/HDL Ratio: 2.2 ratio (ref 0.0–4.4)
Cholesterol, Total: 142 mg/dL (ref 100–199)
HDL: 64 mg/dL (ref >39)
LDL Chol Calc (NIH): 70 mg/dL (ref 0–99)
Triglycerides: 29 mg/dL (ref 0–149)
VLDL Cholesterol Cal: 8 mg/dL (ref 5–40)

## 2019-10-12 LAB — VITAMIN D 25 HYDROXY (VIT D DEFICIENCY, FRACTURES): Vit D, 25-Hydroxy: 23.6 ng/mL — ABNORMAL LOW (ref 30.0–100.0)

## 2019-10-12 LAB — TSH: TSH: 2.38 u[IU]/mL (ref 0.450–4.500)

## 2019-10-14 ENCOUNTER — Encounter: Payer: Self-pay | Admitting: Obstetrics and Gynecology

## 2019-10-17 ENCOUNTER — Other Ambulatory Visit: Payer: Self-pay

## 2019-10-18 ENCOUNTER — Encounter: Payer: Self-pay | Admitting: Obstetrics and Gynecology

## 2019-10-18 ENCOUNTER — Other Ambulatory Visit: Payer: Self-pay

## 2019-10-18 ENCOUNTER — Ambulatory Visit (INDEPENDENT_AMBULATORY_CARE_PROVIDER_SITE_OTHER): Payer: BC Managed Care – PPO

## 2019-10-18 ENCOUNTER — Ambulatory Visit: Payer: BC Managed Care – PPO | Admitting: Obstetrics and Gynecology

## 2019-10-18 VITALS — BP 110/74 | HR 64 | Temp 97.3°F | Ht 67.0 in

## 2019-10-18 DIAGNOSIS — N941 Unspecified dyspareunia: Secondary | ICD-10-CM

## 2019-10-18 DIAGNOSIS — N946 Dysmenorrhea, unspecified: Secondary | ICD-10-CM

## 2019-10-18 DIAGNOSIS — N83202 Unspecified ovarian cyst, left side: Secondary | ICD-10-CM

## 2019-10-18 DIAGNOSIS — R1900 Intra-abdominal and pelvic swelling, mass and lump, unspecified site: Secondary | ICD-10-CM

## 2019-10-18 MED ORDER — NORETHINDRONE 0.35 MG PO TABS: 1.0000 | ORAL_TABLET | Freq: Every day | ORAL | 0 refills | Status: DC

## 2019-10-18 NOTE — Progress Notes (Signed)
GYNECOLOGY  VISIT   HPI: 34 y.o.   Married  Caucasian  female   [id number] with Patient's last menstrual period was 10/02/2019 (exact date).   here for pelvic ultrasound.    She had a cul de sac mass with her pelvic exam on 10/10/18.  Her pelvic exam was very uncomfortable.  She has dysmenorrhea.   She has symptoms for a year.  Has pressure with intercourse, which is now painful in most positions.  She has some bladder retention for some months.   She has painful periods for one year.   Not planning on carrying a pregnancy.   She used birth control pills in the past during her teens and 50s.  She does better with lose dose pills.   Quit smoking 9 - 10 months ago.   Living in [place].  Recently obtained insurance.  Recovering from the death of her mother.  GYNECOLOGIC HISTORY: Patient's last menstrual period was 10/02/2019 (exact date). Contraception: condoms everytime Menopausal hormone therapy:  none Last mammogram:  10-10-18 Rt.Diag./density D/stable benign-appearing Rt.Br.asymmetry./Neg/rec.Bil.Diag in 6 mo.to demonstrate 1 yr.stability/BiRads3--appt.10-11-19 Last pap smear:  03-28-18 Neg:Neg HR HPV, 2015 ?Neg        OB History    Gravida  0   Para  0   Term  0   Preterm  0   AB  0   Living  0     SAB  0   TAB  0   Ectopic  0   Multiple  0   Live Births  0              There are no problems to display for this patient.   Past Medical History:  Diagnosis Date  . Abnormal Pap smear of cervix   . Anxiety   . Depression   . Leukocytosis   . Low vitamin D level   . Migraine with aura   . STD (sexually transmitted disease)    HSV1    Past Surgical History:  Procedure Laterality Date  . COLPOSCOPY    . TONSILLECTOMY      Current Outpatient Medications  Medication Sig Dispense Refill  . valACYclovir (VALTREX) 1000 MG tablet Take 2 tablets (2000 mg) by mouth twice a day for 24 hours. 30 tablet 1   No current facility-administered medications for  this visit.     ALLERGIES: Latex and Sulfa antibiotics  Family History  Problem Relation Age of Onset  . Diabetes Mother   . Hypertension Mother   . Stroke Mother        TIA  . Factor V Leiden deficiency Mother   . Other Father        kidney tumor  . Cancer Father        cancerous tumor of kidney  . Crohn's disease Father   . Breast cancer Maternal Grandmother   . Stroke Maternal Grandfather     Social History   Socioeconomic History  . Marital status: Married    Spouse name: Not on file  . Number of children: Not on file  . Years of education: Not on file  . Highest education level: Not on file  Occupational History  . Not on file  Tobacco Use  . Smoking status: Former Smoker    Packs/day: 1.00    Types: Cigarettes  . Smokeless tobacco: Never Used  . Tobacco comment: vaping now occ  Substance and Sexual Activity  . Alcohol use: Not Currently  . Drug use: No  .  Sexual activity: Yes    Partners: Male    Birth control/protection: Condom    Comment: everytime  Other Topics Concern  . Not on file  Social History Narrative  . Not on file   Social Determinants of Health   Financial Resource Strain:   . Difficulty of Paying Living Expenses: Not on file  Food Insecurity:   . Worried About Charity fundraiser in the Last Year: Not on file  . Ran Out of Food in the Last Year: Not on file  Transportation Needs:   . Lack of Transportation (Medical): Not on file  . Lack of Transportation (Non-Medical): Not on file  Physical Activity:   . Days of Exercise per Week: Not on file  . Minutes of Exercise per Session: Not on file  Stress:   . Feeling of Stress : Not on file  Social Connections:   . Frequency of Communication with Friends and Family: Not on file  . Frequency of Social Gatherings with Friends and Family: Not on file  . Attends Religious Services: Not on file  . Active Member of Clubs or Organizations: Not on file  . Attends Archivist  Meetings: Not on file  . Marital Status: Not on file  Intimate Partner Violence:   . Fear of Current or Ex-Partner: Not on file  . Emotionally Abused: Not on file  . Physically Abused: Not on file  . Sexually Abused: Not on file    Review of Systems  All other systems reviewed and are negative.   PHYSICAL EXAMINATION:    BP 110/74   Pulse 64   Temp (!) 97.3 F (36.3 C) (Temporal)   Ht 5\' 7"  (1.702 m)   LMP 10/02/2019 (Exact Date)   BMI 20.92 kg/m     General appearance: alert, cooperative and appears stated age  Pelvic US  Uterus no masses.  EMS 11.40 mm. Left ovary with 11 x 7 x 9 cm cyst with thin septation.  Right ovary with small CL.  No free fluid.  ASSESSMENT  Dysmenorrhea.  Dyspareunia. Large left ovarian cyst versus paratubal cyst. Vaping. FH factor V Leiden.  Patient is personally negative.   PLAN  We dicussed her large cyst and potential endometriosis as causes for her dyspareunia and dysmenorrhea.  I reviewed signs and symptoms of torsion.  Micronor.  Repeat US in  6 weeks.  I discussed laparoscopic ovarian cystectomy and possible oophorectomy with collection of pelvic washings. Risks and benefits reviewed.  She will consider her options and plan for at least a follow up US as above.    An After Visit Summary was printed and given to the patient.  __25____ minutes face to face time of which over 50% was spent in counseling.

## 2019-10-18 NOTE — Patient Instructions (Signed)
Ovarian Cyst     An ovarian cyst is a fluid-filled sac that forms on an ovary. The ovaries are small organs that produce eggs in women. Various types of cysts can form on the ovaries. Some may cause symptoms and require treatment. Most ovarian cysts go away on their own, are not cancerous (are benign), and do not cause problems. Common types of ovarian cysts include:  Functional (follicle) cysts. ? Occur during the menstrual cycle, and usually go away with the next menstrual cycle if you do not get pregnant. ? Usually cause no symptoms.  Endometriomas. ? Are cysts that form from the tissue that lines the uterus (endometrium). ? Are sometimes called "chocolate cysts" because they become filled with blood that turns brown. ? Can cause pain in the lower abdomen during intercourse and during your period.  Cystadenoma cysts. ? Develop from cells on the outside surface of the ovary. ? Can get very large and cause lower abdomen pain and pain with intercourse. ? Can cause severe pain if they twist or break open (rupture).  Dermoid cysts. ? Are sometimes found in both ovaries. ? May contain different kinds of body tissue, such as skin, teeth, hair, or cartilage. ? Usually do not cause symptoms unless they get very big.  Theca lutein cysts. ? Occur when too much of a certain hormone (human chorionic gonadotropin) is produced and overstimulates the ovaries to produce an egg. ? Are most common after having procedures used to assist with the conception of a baby (in vitro fertilization). What are the causes? Ovarian cysts may be caused by:  Ovarian hyperstimulation syndrome. This is a condition that can develop from taking fertility medicines. It causes multiple large ovarian cysts to form.  Polycystic ovarian syndrome (PCOS). This is a common hormonal disorder that can cause ovarian cysts, as well as problems with your period or fertility. What increases the risk? The following factors may  make you more likely to develop ovarian cysts:  Being overweight or obese.  Taking fertility medicines.  Taking certain forms of hormonal birth control.  Smoking. What are the signs or symptoms? Many ovarian cysts do not cause symptoms. If symptoms are present, they may include:  Pelvic pain or pressure.  Pain in the lower abdomen.  Pain during sex.  Abdominal swelling.  Abnormal menstrual periods.  Increasing pain with menstrual periods. How is this diagnosed? These cysts are commonly found during a routine pelvic exam. You may have tests to find out more about the cyst, such as:  Ultrasound.  X-ray of the pelvis.  CT scan.  MRI.  Blood tests. How is this treated? Many ovarian cysts go away on their own without treatment. Your health care provider may want to check your cyst regularly for 2-3 months to see if it changes. If you are in menopause, it is especially important to have your cyst monitored closely because menopausal women have a higher rate of ovarian cancer. When treatment is needed, it may include:  Medicines to help relieve pain.  A procedure to drain the cyst (aspiration).  Surgery to remove the whole cyst.  Hormone treatment or birth control pills. These methods are sometimes used to help dissolve a cyst. Follow these instructions at home:  Take over-the-counter and prescription medicines only as told by your health care provider.  Do not drive or use heavy machinery while taking prescription pain medicine.  Get regular pelvic exams and Pap tests as often as told by your health care provider.    Return to your normal activities as told by your health care provider. Ask your health care provider what activities are safe for you.  Do not use any products that contain nicotine or tobacco, such as cigarettes and e-cigarettes. If you need help quitting, ask your health care provider.  Keep all follow-up visits as told by your health care provider.  This is important. Contact a health care provider if:  Your periods are late, irregular, or painful, or they stop.  You have pelvic pain that does not go away.  You have pressure on your bladder or trouble emptying your bladder completely.  You have pain during sex.  You have any of the following in your abdomen: ? A feeling of fullness. ? Pressure. ? Discomfort. ? Pain that does not go away. ? Swelling.  You feel generally ill.  You become constipated.  You lose your appetite.  You develop severe acne.  You start to have more body hair and facial hair.  You are gaining weight or losing weight without changing your exercise and eating habits.  You think you may be pregnant. Get help right away if:  You have abdominal pain that is severe or gets worse.  You cannot eat or drink without vomiting.  You suddenly develop a fever.  Your menstrual period is much heavier than usual. This information is not intended to replace advice given to you by your health care provider. Make sure you discuss any questions you have with your health care provider. Document Revised: 12/19/2017 Document Reviewed: 02/22/2016 Elsevier Patient Education  2020 [place]. Diagnostic Laparoscopy Diagnostic laparoscopy is a procedure to diagnose diseases in the abdomen. It might be done for a variety of reasons, such as to look for scar tissue, cancer, or a reason for abdomen (abdominal) pain. During the procedure, a thin, flexible tube that has a light and a camera on the end (laparoscope) is inserted through an incision in the abdomen. The image from the camera is shown on a monitor to help your surgeon see inside your body. Tell a health care provider about:  Any allergies you have.  All medicines you are taking, including vitamins, herbs, eye drops, creams, and over-the-counter medicines.  Any problems you or family members have had with anesthetic medicines.  Any blood disorders you  have.  Any surgeries you have had.  Any medical conditions you have. What are the risks? Generally, this is a safe procedure. However, problems may occur, including:  Infection.  Bleeding.  Allergic reactions to medicines or dyes.  Damage to abdominal structures or organs, such as the intestines, liver, stomach, or spleen. What happens before the procedure? Medicines  Ask your health care provider about: ? Changing or stopping your regular medicines. This is especially important if you are taking diabetes medicines or blood thinners. ? Taking medicines such as aspirin and ibuprofen. These medicines can thin your blood. Do not take these medicines unless your health care provider tells you to take them. ? Taking over-the-counter medicines, vitamins, herbs, and supplements.  You may be given antibiotic medicine to help prevent infection. Staying hydrated Follow instructions from your health care provider about hydration, which may include:  Up to 2 hours before the procedure - you may continue to drink clear liquids, such as water, clear fruit juice, black coffee, and plain tea. Eating and drinking restrictions Follow instructions from your health care provider about eating and drinking, which may include:  8 hours before the procedure - stop eating  heavy meals or foods such as meat, fried foods, or fatty foods.  6 hours before the procedure - stop eating light meals or foods, such as toast or cereal.  6 hours before the procedure - stop drinking milk or drinks that contain milk.  2 hours before the procedure - stop drinking clear liquids. General instructions  Ask your health care provider how your surgical site will be marked or identified.  You may be asked to shower with a germ-killing soap.  Plan to have someone take you home from the hospital or clinic.  Plan to have a responsible adult care for you for at least 24 hours after you leave the hospital or clinic. This  is important. What happens during the procedure?   To lower your risk of infection: ? Your health care team will wash or sanitize their hands. ? Hair may be removed from the surgical area. ? Your skin will be washed with soap.  An IV will be inserted into one of your veins.  You will be given a medicine to make you fall asleep (general anesthetic). You may also be given a medicine to help you relax (sedative).  A breathing tube will be placed down your throat to help you breathe during the procedure.  Your abdomen will be filled with an air-like gas so it expands. This will give the surgeon more room to operate and will make your organs easier to see.  Many small incisions will be made in your abdomen.  A laparoscope and other surgical instruments will be inserted into your abdomen through the incisions.  A tissue sample may be removed from an organ for examination (biopsy). This will depend on the reason why you are having this procedure.  The laparoscope and other instruments will be removed from your abdomen.  The gas will be released.  Your incisions will be closed with stitches (sutures) and covered with a bandage (dressing).  Your breathing tube will be removed. The procedure may vary among health care providers and hospitals. What happens after the procedure?   Your blood pressure, heart rate, breathing rate, and blood oxygen level will be monitored until the medicines you were given have worn off.  Do not drive for 24 hours if you were given a sedative during your procedure.  It is up to you to get the results of your procedure. Ask your health care provider, or the department that is doing the procedure, when your results will be ready. Summary  Diagnostic laparoscopy is a way to look for problems in the abdomen using small incisions.  Follow instructions from your health care provider about how to prepare for the procedure.  Plan to have a responsible adult care  for you for at least 24 hours after you leave the hospital or clinic. This is important. This information is not intended to replace advice given to you by your health care provider. Make sure you discuss any questions you have with your health care provider. Document Revised: 09/02/2017 Document Reviewed: 03/16/2017 Elsevier Patient Education  [place].

## 2019-10-18 NOTE — Progress Notes (Signed)
Encounter reviewed by Dr. Emre Stock Amundson C. Silva.  

## 2019-10-23 ENCOUNTER — Telehealth: Payer: Self-pay | Admitting: Obstetrics and Gynecology

## 2019-10-23 NOTE — Telephone Encounter (Signed)
Patient returned call

## 2019-10-23 NOTE — Telephone Encounter (Signed)
Thank you for the update.   The procedure will be left ovarian cystectomy, possible left oophorectomy, and collection of pelvic washings.

## 2019-10-23 NOTE — Telephone Encounter (Signed)
Spoke with patient. Patient request to proceed with laparoscopic ovarian cystectomy and possible oophorectomy with collection of pelvic washings discussed at 10/18/19 OV with Dr. Quincy Simmonds.   Advised patient I will update Dr. Quincy Simmonds of her decision. Our business office will be contact regarding benefits and Verline Lema, RN will return call to discuss scheduling options. Patient verbalizes understanding and is agreeable.   Routing to Dr. Quincy Simmonds  Cc: Lerry Liner, Reesa Chew, RN

## 2019-10-23 NOTE — Telephone Encounter (Signed)
Thank you for the update!

## 2019-10-23 NOTE — Telephone Encounter (Signed)
Patient is following up on scheduling a procedure she discussed with Dr Quincy Simmonds.

## 2019-10-23 NOTE — Telephone Encounter (Signed)
Spoke with patient. Patient would like to proceed with surgery on 11/13/2019 if possible. Advised will tentatively place her for 11/13/2019. Will have Suzy review her benefits and contact her. Once this is complete we can proceed with scheduling.

## 2019-10-23 NOTE — Telephone Encounter (Signed)
Left message to call Sharee Pimple, RN at [place].    OV on 10/18/19 with Dr. Quincy Simmonds

## 2019-10-25 NOTE — Telephone Encounter (Signed)
Spoke with patient. Surgery scheduled for 11/13/2019 at [facility] for 10:30 am with 8:30 am arrival. COVID test scheduled for 11/09/2019 at 10:35 am at [facility] location. 2 week post op scheduled for 11/20/2019 at 10 am with Dr.Silva. Patient is agreeable to all appointment dates and times. Surgery instructions reviewed and mailed to patient. Patient verbalizes understanding.  Routing to provider and will close encounter.

## 2019-10-25 NOTE — Telephone Encounter (Signed)
Spoke with patient. Patient would like to proceed with scheduling surgery on 11/13/2019. Advised will request this with [facility] and return call once this date and time have been confirmed. At that time we will review pre surgery instructions and schedule COVID testing and post op appointment.

## 2019-10-25 NOTE — Telephone Encounter (Signed)
Spoke with patient in regards to benefits for surgery. Patient acknowledges understanding of information presented. Patient is aware this is the professional benefit only. Patient is aware once the surgery is scheduled, she will be contacted by the hospital with their benefit information. Patient is aware of the surgery cancellation policy. Patient has confirmed and is ready to proceed with scheduling. Forwarding to Conservation officer, historic buildings for scheduling.  Routing to [employer], Therapist, sports

## 2019-10-26 NOTE — Telephone Encounter (Signed)
Please add possible left salpingectomy to her surgical procedure.  Thank you.   I am highlighting this message so you know that I added additional information.

## 2019-10-29 NOTE — Telephone Encounter (Signed)
Spoke with Central Scheduling this has been added to the patient's case for 11/13/2019.  Cc: Lerry Liner

## 2019-10-30 ENCOUNTER — Telehealth: Payer: Self-pay | Admitting: Obstetrics and Gynecology

## 2019-10-30 NOTE — Telephone Encounter (Signed)
Spoke to pt. Pt informed of Dr Quincy Simmonds recommendations on Micronor. Pt aware and will let Dr Quincy Simmonds know if she takes or not.   Will route to Dr Quincy Simmonds for review and will close encounter.

## 2019-10-30 NOTE — Telephone Encounter (Signed)
Patient choice if she would like to take the Micronor for pregnancy prevention.  This medication is not necessary for her to take prior to surgery.

## 2019-10-30 NOTE — Telephone Encounter (Signed)
Patient is calling with questions regarding procedure.

## 2019-10-30 NOTE — Telephone Encounter (Signed)
Spoke to pt. Pt wanting to know if should start taking Micronor before surgery ? Pt has not starting taking since PUS on 10/18/2019. Should pt start now and take til surgery date? Will review with Dr Quincy Simmonds and return call to pt. Pt agreeable.   Pt also wanting to know if needs to call [facility] for pre op appt or will they call her? Surgery with Dr Quincy Simmonds scheduled for 11/13/2019. Pt informed that she will be called in a week out from surgery from [facility] to scheduled pre op appt. Pt verbalized understanding.   Routing to Dr Quincy Simmonds for recommendations on Micronor Rx.

## 2019-11-01 NOTE — H&P (Signed)
Office Visit  10/18/2019 [place] Silva, Everardo All, MD Obstetrics and Gynecology  Left ovarian cyst +2 more Dx  Pelvic ultrasound  Reason for Visit  Additional Documentation  Vitals:    BP 110/74  Pulse 64  Temp 97.3 F (36.3 C)  (Temporal)  Ht 5\' 7"  (1.702 m)  LMP 10/02/2019 (Exact Date)  BMI 20.92 kg/m  BSA 1.69 m    More Vitals  Flowsheets:    NEWS,  MEWS Score,  Method of Visit    Encounter Info:   Billing Info,  History,  Allergies,  Detailed Report    All Notes   Progress Notes by Nunzio Cobbs, MD at 10/18/2019 11:00 AM Author: Nunzio Cobbs, MD Author Type: Physician Filed: 10/21/2019 10:06 PM  Note Status: Signed Cosign: Cosign Not Required Encounter Date: 10/18/2019  Editor: Nunzio Cobbs, MD (Physician)  Prior Versions: 1. Orion Crook, RDMS (Technician) at 10/18/2019 11:13 AM - Sign when Signing Visit    Encounter reviewed by Dr. Aundria Rud.              Progress Notes by Nunzio Cobbs, MD at 10/18/2019 11:00 AM Author: Nunzio Cobbs, MD Author Type: Physician Filed: 10/21/2019 10:06 PM  Note Status: Signed Cosign: Cosign Not Required Encounter Date: 10/18/2019  Editor: Nunzio Cobbs, MD (Physician)  Prior Versions: 1. Nunzio Cobbs, MD (Physician) at 10/18/2019 11:43 AM - Sign when Signing Visit   2. Lowella Fairy, CMA (Certified Medical Assistant) at 10/18/2019 11:30 AM - Incomplete    GYNECOLOGY  VISIT   HPI: 34 y.o.   Married  Caucasian  female   [id number] with Patient's last menstrual period was 10/02/2019 (exact date).   here for pelvic ultrasound.     She had a cul de sac mass with her pelvic exam on 10/10/18.  Her pelvic exam was very uncomfortable.  She has dysmenorrhea.    She has symptoms for a year.  Has pressure with intercourse, which is now painful in most positions.  She has some  bladder retention for some months.    She has painful periods for one year.    Not planning on carrying a pregnancy.    She used birth control pills in the past during her teens and 62s.  She does better with lose dose pills.    Quit smoking 9 - 10 months ago.   Living in [place].  Recently obtained insurance.  Recovering from the death of her mother.   GYNECOLOGIC HISTORY: Patient's last menstrual period was 10/02/2019 (exact date). Contraception: condoms everytime Menopausal hormone therapy:  none Last mammogram:  10-10-18 Rt.Diag./density D/stable benign-appearing Rt.Br.asymmetry./Neg/rec.Bil.Diag in 6 mo.to demonstrate 1 yr.stability/BiRads3--appt.10-11-19 Last pap smear:  03-28-18 Neg:Neg HR HPV, 2015 ?Neg                OB History     Gravida  0   Para  0   Term  0   Preterm  0   AB  0   Living  0      SAB  0   TAB  0   Ectopic  0   Multiple  0   Live Births  0                 There are no problems to display for this patient.  Past Medical History:  Diagnosis Date  . Abnormal Pap smear of cervix    . Anxiety    . Depression    . Leukocytosis    . Low vitamin D level    . Migraine with aura    . STD (sexually transmitted disease)      HSV1           Past Surgical History:  Procedure Laterality Date  . COLPOSCOPY      . TONSILLECTOMY                Current Outpatient Medications  Medication Sig Dispense Refill  . valACYclovir (VALTREX) 1000 MG tablet Take 2 tablets (2000 mg) by mouth twice a day for 24 hours. 30 tablet 1    No current facility-administered medications for this visit.      ALLERGIES: Latex and Sulfa antibiotics        Family History  Problem Relation Age of Onset  . Diabetes Mother    . Hypertension Mother    . Stroke Mother          TIA  . Factor V Leiden deficiency Mother    . Other Father          kidney tumor  . Cancer Father          cancerous tumor of kidney  . Crohn's disease Father    .  Breast cancer Maternal Grandmother    . Stroke Maternal Grandfather        Social History         Socioeconomic History  . Marital status: Married      Spouse name: Not on file  . Number of children: Not on file  . Years of education: Not on file  . Highest education level: Not on file  Occupational History  . Not on file  Tobacco Use  . Smoking status: Former Smoker      Packs/day: 1.00      Types: Cigarettes  . Smokeless tobacco: Never Used  . Tobacco comment: vaping now occ  Substance and Sexual Activity  . Alcohol use: Not Currently  . Drug use: No  . Sexual activity: Yes      Partners: Male      Birth control/protection: Condom      Comment: everytime  Other Topics Concern  . Not on file  Social History Narrative  . Not on file    Social Determinants of Health       Financial Resource Strain:   . Difficulty of Paying Living Expenses: Not on file  Food Insecurity:   . Worried About Charity fundraiser in the Last Year: Not on file  . Ran Out of Food in the Last Year: Not on file  Transportation Needs:   . Lack of Transportation (Medical): Not on file  . Lack of Transportation (Non-Medical): Not on file  Physical Activity:   . Days of Exercise per Week: Not on file  . Minutes of Exercise per Session: Not on file  Stress:   . Feeling of Stress : Not on file  Social Connections:   . Frequency of Communication with Friends and Family: Not on file  . Frequency of Social Gatherings with Friends and Family: Not on file  . Attends Religious Services: Not on file  . Active Member of Clubs or Organizations: Not on file  . Attends Archivist Meetings: Not on file  . Marital Status: Not on file  Intimate  Partner Violence:   . Fear of Current or Ex-Partner: Not on file  . Emotionally Abused: Not on file  . Physically Abused: Not on file  . Sexually Abused: Not on file      Review of Systems  All other systems reviewed and are negative.     PHYSICAL  EXAMINATION:     BP 110/74   Pulse 64   Temp (!) 97.3 F (36.3 C) (Temporal)   Ht 5\' 7"  (1.702 m)   LMP 10/02/2019 (Exact Date)   BMI 20.92 kg/m     General appearance: alert, cooperative and appears stated age   Pelvic US  Uterus no masses.  EMS 11.40 mm. Left ovary with 11 x 7 x 9 cm cyst with thin septation.  Right ovary with small CL.  No free fluid.   ASSESSMENT   Dysmenorrhea.  Dyspareunia. Large left ovarian cyst versus paratubal cyst. Vaping. FH factor V Leiden.  Patient is personally negative.    PLAN   We dicussed her large cyst and potential endometriosis as causes for her dyspareunia and dysmenorrhea.  I reviewed signs and symptoms of torsion.  Micronor.  Repeat US in  6 weeks.  I discussed laparoscopic ovarian cystectomy and possible oophorectomy with collection of pelvic washings. Risks and benefits reviewed.  She will consider her options and plan for at least a follow up US as above.    An After Visit Summary was printed and given to the patient.   __25____ minutes face to face time of which over 50% was spent in counseling.

## 2019-11-05 HISTORY — PX: LEFT OOPHORECTOMY: SHX1961

## 2019-11-07 ENCOUNTER — Other Ambulatory Visit: Payer: Self-pay

## 2019-11-07 ENCOUNTER — Encounter (HOSPITAL_BASED_OUTPATIENT_CLINIC_OR_DEPARTMENT_OTHER): Payer: Self-pay | Admitting: Obstetrics and Gynecology

## 2019-11-07 NOTE — Progress Notes (Signed)
PCP - N/A Cardiologist -N/A   Chest x-ray - N/A EKG - N/A Stress Test - N/A ECHO - N/A Cardiac Cath - N/A  Sleep Study -N/A  CPAP - N/A  Fasting Blood Sugar - N/A Checks Blood Sugar __N/A___ times a day  Blood Thinner Instructions:  N/A Aspirin Instructions: N/A Last Dose:  N/A  Anesthesia review: N/A  Patient denies shortness of breath, fever, cough and chest pain at PAT appointment   Patient verbalized understanding of instructions that were given to them at the PAT appointment. Patient was also instructed that they will need to review over the PAT instructions again at home before surgery.

## 2019-11-07 NOTE — Progress Notes (Signed)
Spoke with: Ailene Ravel NPO:  After Midnight, no gum, candy, or mints  Arrival time: 0815 AM Labs: UPT (CBC, T&S 11/09/2019) AM medications: None Pre op orders: Yes Ride home: Wilfred Lacy (husband) [phone]

## 2019-11-09 ENCOUNTER — Other Ambulatory Visit (HOSPITAL_COMMUNITY)
Admission: RE | Admit: 2019-11-09 | Discharge: 2019-11-09 | Disposition: A | Discharge status: Home / Self Care | Payer: BC Managed Care – PPO | Source: Ambulatory Visit | Attending: Obstetrics and Gynecology | Admitting: Obstetrics and Gynecology

## 2019-11-09 ENCOUNTER — Encounter (HOSPITAL_COMMUNITY)
Admission: RE | Admit: 2019-11-09 | Discharge: 2019-11-09 | Disposition: A | Discharge status: Home / Self Care | Payer: BC Managed Care – PPO | Source: Ambulatory Visit | Attending: Obstetrics and Gynecology | Admitting: Obstetrics and Gynecology

## 2019-11-09 ENCOUNTER — Other Ambulatory Visit: Payer: Self-pay

## 2019-11-09 DIAGNOSIS — Z01812 Encounter for preprocedural laboratory examination: Secondary | ICD-10-CM | POA: Insufficient documentation

## 2019-11-09 DIAGNOSIS — Z20822 Contact with and (suspected) exposure to covid-19: Secondary | ICD-10-CM | POA: Diagnosis not present

## 2019-11-09 LAB — CBC
HCT: 43.3 % (ref 36.0–46.0)
Hemoglobin: 14 g/dL (ref 12.0–15.0)
MCH: 28.9 pg (ref 26.0–34.0)
MCHC: 32.3 g/dL (ref 30.0–36.0)
MCV: 89.5 fL (ref 80.0–100.0)
Platelets: 237 10*3/uL (ref 150–400)
RBC: 4.84 MIL/uL (ref 3.87–5.11)
RDW: 12.6 % (ref 11.5–15.5)
WBC: 7.8 10*3/uL (ref 4.0–10.5)
nRBC: 0 % (ref 0.0–0.2)

## 2019-11-09 LAB — ABO/RH: ABO/RH(D): O POS

## 2019-11-09 LAB — SARS CORONAVIRUS 2 (TAT 6-24 HRS): SARS Coronavirus 2: NEGATIVE

## 2019-11-13 ENCOUNTER — Encounter (HOSPITAL_BASED_OUTPATIENT_CLINIC_OR_DEPARTMENT_OTHER)
Admission: RE | Disposition: A | Discharge status: Home / Self Care | Payer: Self-pay | Source: Home / Self Care | Attending: Obstetrics and Gynecology

## 2019-11-13 ENCOUNTER — Ambulatory Visit (HOSPITAL_BASED_OUTPATIENT_CLINIC_OR_DEPARTMENT_OTHER)
Admission: RE | Admit: 2019-11-13 | Discharge: 2019-11-13 | Disposition: A | Discharge status: Home / Self Care | Payer: BC Managed Care – PPO | Attending: Obstetrics and Gynecology | Admitting: Obstetrics and Gynecology

## 2019-11-13 ENCOUNTER — Other Ambulatory Visit: Payer: Self-pay

## 2019-11-13 ENCOUNTER — Ambulatory Visit (HOSPITAL_BASED_OUTPATIENT_CLINIC_OR_DEPARTMENT_OTHER): Payer: BC Managed Care – PPO | Admitting: Anesthesiology

## 2019-11-13 ENCOUNTER — Encounter (HOSPITAL_BASED_OUTPATIENT_CLINIC_OR_DEPARTMENT_OTHER): Payer: Self-pay | Admitting: Obstetrics and Gynecology

## 2019-11-13 DIAGNOSIS — N838 Other noninflammatory disorders of ovary, fallopian tube and broad ligament: Secondary | ICD-10-CM | POA: Insufficient documentation

## 2019-11-13 DIAGNOSIS — K660 Peritoneal adhesions (postprocedural) (postinfection): Secondary | ICD-10-CM | POA: Diagnosis not present

## 2019-11-13 DIAGNOSIS — D3912 Neoplasm of uncertain behavior of left ovary: Secondary | ICD-10-CM | POA: Insufficient documentation

## 2019-11-13 DIAGNOSIS — Z91040 Latex allergy status: Secondary | ICD-10-CM | POA: Diagnosis not present

## 2019-11-13 DIAGNOSIS — N946 Dysmenorrhea, unspecified: Secondary | ICD-10-CM | POA: Diagnosis not present

## 2019-11-13 DIAGNOSIS — Z87891 Personal history of nicotine dependence: Secondary | ICD-10-CM | POA: Insufficient documentation

## 2019-11-13 DIAGNOSIS — N941 Unspecified dyspareunia: Secondary | ICD-10-CM | POA: Diagnosis not present

## 2019-11-13 DIAGNOSIS — Z79899 Other long term (current) drug therapy: Secondary | ICD-10-CM | POA: Insufficient documentation

## 2019-11-13 DIAGNOSIS — Z882 Allergy status to sulfonamides status: Secondary | ICD-10-CM | POA: Diagnosis not present

## 2019-11-13 DIAGNOSIS — D271 Benign neoplasm of left ovary: Secondary | ICD-10-CM

## 2019-11-13 DIAGNOSIS — R339 Retention of urine, unspecified: Secondary | ICD-10-CM | POA: Insufficient documentation

## 2019-11-13 DIAGNOSIS — Z881 Allergy status to other antibiotic agents status: Secondary | ICD-10-CM | POA: Diagnosis not present

## 2019-11-13 DIAGNOSIS — N83202 Unspecified ovarian cyst, left side: Secondary | ICD-10-CM | POA: Diagnosis not present

## 2019-11-13 DIAGNOSIS — N736 Female pelvic peritoneal adhesions (postinfective): Secondary | ICD-10-CM

## 2019-11-13 HISTORY — DX: Dysmenorrhea, unspecified: N94.6

## 2019-11-13 HISTORY — DX: Presence of spectacles and contact lenses: Z97.3

## 2019-11-13 HISTORY — DX: Pain in unspecified joint: M25.50

## 2019-11-13 HISTORY — PX: LAPAROSCOPIC OVARIAN CYSTECTOMY: SHX6248

## 2019-11-13 HISTORY — DX: Unspecified asthma, uncomplicated: J45.909

## 2019-11-13 HISTORY — DX: Personal history of diseases of the blood and blood-forming organs and certain disorders involving the immune mechanism: Z86.2

## 2019-11-13 HISTORY — DX: Unspecified ovarian cyst, unspecified side: N83.209

## 2019-11-13 LAB — TYPE AND SCREEN
ABO/RH(D): O POS
Antibody Screen: NEGATIVE

## 2019-11-13 LAB — PREGNANCY, URINE: Preg Test, Ur: NEGATIVE

## 2019-11-13 LAB — POCT PREGNANCY, URINE: Preg Test, Ur: NEGATIVE

## 2019-11-13 SURGERY — EXCISION, CYST, OVARY, LAPAROSCOPIC
Anesthesia: General | Site: Abdomen | Laterality: Left

## 2019-11-13 MED ORDER — FENTANYL CITRATE (PF) 250 MCG/5ML IJ SOLN
INTRAMUSCULAR | Status: AC
  Filled 2019-11-13: qty 5

## 2019-11-13 MED ORDER — MEPERIDINE HCL 25 MG/ML IJ SOLN
6.2500 mg | INTRAMUSCULAR | Status: DC | PRN
  Filled 2019-11-13: qty 1

## 2019-11-13 MED ORDER — MIDAZOLAM HCL 2 MG/2ML IJ SOLN
INTRAMUSCULAR | Status: AC
  Filled 2019-11-13: qty 2

## 2019-11-13 MED ORDER — LIDOCAINE 2% (20 MG/ML) 5 ML SYRINGE
INTRAMUSCULAR | Status: AC
  Filled 2019-11-13: qty 5

## 2019-11-13 MED ORDER — PROMETHAZINE HCL 25 MG/ML IJ SOLN
6.2500 mg | INTRAMUSCULAR | Status: DC | PRN
  Filled 2019-11-13: qty 1

## 2019-11-13 MED ORDER — KETOROLAC TROMETHAMINE 30 MG/ML IJ SOLN
INTRAMUSCULAR | Status: DC | PRN
  Administered 2019-11-13: 30 mg via INTRAVENOUS

## 2019-11-13 MED ORDER — ONDANSETRON HCL 4 MG/2ML IJ SOLN
INTRAMUSCULAR | Status: DC | PRN
  Administered 2019-11-13: 4 mg via INTRAVENOUS

## 2019-11-13 MED ORDER — DEXMEDETOMIDINE HCL IN NACL 200 MCG/50ML IV SOLN
INTRAVENOUS | Status: AC
  Filled 2019-11-13: qty 50

## 2019-11-13 MED ORDER — ROCURONIUM BROMIDE 10 MG/ML (PF) SYRINGE
PREFILLED_SYRINGE | INTRAVENOUS | Status: DC | PRN
  Administered 2019-11-13: 10 mg via INTRAVENOUS
  Administered 2019-11-13: 50 mg via INTRAVENOUS

## 2019-11-13 MED ORDER — HYDROCODONE-ACETAMINOPHEN 5-325 MG PO TABS: 1.0000 | ORAL_TABLET | ORAL | 0 refills | Status: DC | PRN

## 2019-11-13 MED ORDER — SUGAMMADEX SODIUM 200 MG/2ML IV SOLN
INTRAVENOUS | Status: DC | PRN
  Administered 2019-11-13: 150 mg via INTRAVENOUS

## 2019-11-13 MED ORDER — SODIUM CHLORIDE 0.9 % IR SOLN
Status: DC | PRN
  Administered 2019-11-13: 3000 mL

## 2019-11-13 MED ORDER — ROCURONIUM BROMIDE 10 MG/ML (PF) SYRINGE
PREFILLED_SYRINGE | INTRAVENOUS | Status: AC
  Filled 2019-11-13: qty 10

## 2019-11-13 MED ORDER — PROPOFOL 10 MG/ML IV BOLUS
INTRAVENOUS | Status: DC | PRN
  Administered 2019-11-13: 200 mg via INTRAVENOUS

## 2019-11-13 MED ORDER — DEXAMETHASONE SODIUM PHOSPHATE 10 MG/ML IJ SOLN
INTRAMUSCULAR | Status: AC
  Filled 2019-11-13: qty 1

## 2019-11-13 MED ORDER — HYDROMORPHONE HCL 1 MG/ML IJ SOLN
0.2500 mg | INTRAMUSCULAR | Status: DC | PRN
  Filled 2019-11-13: qty 0.5

## 2019-11-13 MED ORDER — LIDOCAINE 2% (20 MG/ML) 5 ML SYRINGE
INTRAMUSCULAR | Status: DC | PRN
  Administered 2019-11-13: 40 mg via INTRAVENOUS
  Administered 2019-11-13: 60 mg via INTRAVENOUS

## 2019-11-13 MED ORDER — GLYCOPYRROLATE PF 0.2 MG/ML IJ SOSY
PREFILLED_SYRINGE | INTRAMUSCULAR | Status: DC | PRN
  Administered 2019-11-13: .2 mg via INTRAVENOUS

## 2019-11-13 MED ORDER — FENTANYL CITRATE (PF) 100 MCG/2ML IJ SOLN
INTRAMUSCULAR | Status: DC | PRN
  Administered 2019-11-13 (×3): 50 ug via INTRAVENOUS
  Administered 2019-11-13: 25 ug via INTRAVENOUS

## 2019-11-13 MED ORDER — ACETAMINOPHEN 500 MG PO TABS
ORAL_TABLET | ORAL | Status: AC
  Filled 2019-11-13: qty 2

## 2019-11-13 MED ORDER — MIDAZOLAM HCL 2 MG/2ML IJ SOLN
INTRAMUSCULAR | Status: DC | PRN
  Administered 2019-11-13: 2 mg via INTRAVENOUS

## 2019-11-13 MED ORDER — IBUPROFEN 800 MG PO TABS: 800.0000 mg | ORAL_TABLET | Freq: Three times a day (TID) | ORAL | 1 refills | Status: DC | PRN

## 2019-11-13 MED ORDER — ACETAMINOPHEN 500 MG PO TABS
1000.0000 mg | ORAL_TABLET | Freq: Once | ORAL | Status: AC
  Administered 2019-11-13: 1000 mg via ORAL
  Filled 2019-11-13: qty 2

## 2019-11-13 MED ORDER — OXYCODONE HCL 5 MG PO TABS
5.0000 mg | ORAL_TABLET | Freq: Once | ORAL | Status: DC | PRN
  Filled 2019-11-13: qty 1

## 2019-11-13 MED ORDER — OXYCODONE HCL 5 MG/5ML PO SOLN
5.0000 mg | Freq: Once | ORAL | Status: DC | PRN
  Filled 2019-11-13: qty 5

## 2019-11-13 MED ORDER — SCOPOLAMINE 1 MG/3DAYS TD PT72
1.0000 | MEDICATED_PATCH | TRANSDERMAL | Status: DC
  Administered 2019-11-13: 1.5 mg via TRANSDERMAL
  Filled 2019-11-13: qty 1

## 2019-11-13 MED ORDER — DEXMEDETOMIDINE HCL 200 MCG/2ML IV SOLN
INTRAVENOUS | Status: DC | PRN
  Administered 2019-11-13: 4 ug via INTRAVENOUS
  Administered 2019-11-13: 8 ug via INTRAVENOUS
  Administered 2019-11-13: 4 ug via INTRAVENOUS

## 2019-11-13 MED ORDER — BUPIVACAINE HCL (PF) 0.25 % IJ SOLN
INTRAMUSCULAR | Status: DC | PRN
  Administered 2019-11-13: 10 mL

## 2019-11-13 MED ORDER — LACTATED RINGERS IV SOLN
INTRAVENOUS | Status: DC
  Administered 2019-11-13: 1000 mL via INTRAVENOUS
  Filled 2019-11-13: qty 1000

## 2019-11-13 MED ORDER — DEXAMETHASONE SODIUM PHOSPHATE 10 MG/ML IJ SOLN
INTRAMUSCULAR | Status: DC | PRN
  Administered 2019-11-13: 8 mg via INTRAVENOUS

## 2019-11-13 MED ORDER — NITROFURANTOIN MONOHYD MACRO 100 MG PO CAPS: 100.0000 mg | ORAL_CAPSULE | Freq: Two times a day (BID) | ORAL | 0 refills | Status: AC

## 2019-11-13 MED ORDER — KETOROLAC TROMETHAMINE 30 MG/ML IJ SOLN
INTRAMUSCULAR | Status: AC
  Filled 2019-11-13: qty 1

## 2019-11-13 MED ORDER — KETOROLAC TROMETHAMINE 30 MG/ML IJ SOLN
30.0000 mg | Freq: Once | INTRAMUSCULAR | Status: DC | PRN
  Filled 2019-11-13: qty 1

## 2019-11-13 MED ORDER — PROPOFOL 10 MG/ML IV BOLUS
INTRAVENOUS | Status: AC
  Filled 2019-11-13: qty 20

## 2019-11-13 MED ORDER — ONDANSETRON HCL 4 MG/2ML IJ SOLN
INTRAMUSCULAR | Status: AC
  Filled 2019-11-13: qty 2

## 2019-11-13 MED ORDER — SCOPOLAMINE 1 MG/3DAYS TD PT72
MEDICATED_PATCH | TRANSDERMAL | Status: AC
  Filled 2019-11-13: qty 1

## 2019-11-13 SURGICAL SUPPLY — 49 items
ADH SKN CLS APL DERMABOND .7 (GAUZE/BANDAGES/DRESSINGS) ×2
BAG LAPAROSCOPIC 12 15 PORT 16 (BASKET) ×1 IMPLANT
BAG RETRIEVAL 10MM (BASKET)
BAG RETRIEVAL 12/15 (BASKET) ×3
BAG RETRIEVAL 12/15MM (BASKET) ×1
CABLE HIGH FREQUENCY MONO STRZ (ELECTRODE) IMPLANT
CANISTER SUCT 3000ML PPV (MISCELLANEOUS) ×4 IMPLANT
COVER WAND RF STERILE (DRAPES) ×4 IMPLANT
DERMABOND ADVANCED (GAUZE/BANDAGES/DRESSINGS) ×2
DERMABOND ADVANCED .7 DNX12 (GAUZE/BANDAGES/DRESSINGS) ×2 IMPLANT
DRSG COVADERM PLUS 2X2 (GAUZE/BANDAGES/DRESSINGS) IMPLANT
DRSG OPSITE POSTOP 3X4 (GAUZE/BANDAGES/DRESSINGS) ×6 IMPLANT
DURAPREP 26ML APPLICATOR (WOUND CARE) ×4 IMPLANT
GAUZE 4X4 16PLY RFD (DISPOSABLE) ×4 IMPLANT
GLOVE BIO SURGEON STRL SZ 6.5 (GLOVE) ×6 IMPLANT
GLOVE BIO SURGEONS STRL SZ 6.5 (GLOVE) ×2
GOWN STRL REUS W/TWL LRG LVL3 (GOWN DISPOSABLE) ×8 IMPLANT
GOWN STRL REUS W/TWL XL LVL3 (GOWN DISPOSABLE) ×4 IMPLANT
HOLDER FOLEY CATH W/STRAP (MISCELLANEOUS) ×4 IMPLANT
IV NS IRRIG 3000ML ARTHROMATIC (IV SOLUTION) ×3 IMPLANT
LIGASURE VESSEL 5MM BLUNT TIP (ELECTROSURGICAL) ×4 IMPLANT
NEEDLE INSUFFLATION 120MM (ENDOMECHANICALS) ×4 IMPLANT
NS IRRIG 1000ML POUR BTL (IV SOLUTION) ×4 IMPLANT
PACK LAPAROSCOPY BASIN (CUSTOM PROCEDURE TRAY) ×4 IMPLANT
PACK TRENDGUARD 450 HYBRID PRO (MISCELLANEOUS) ×2 IMPLANT
POUCH LAPAROSCOPIC INSTRUMENT (MISCELLANEOUS) ×4 IMPLANT
PROTECTOR NERVE ULNAR (MISCELLANEOUS) ×8 IMPLANT
SCISSORS LAP 5X35 DISP (ENDOMECHANICALS) IMPLANT
SET IRRIG TUBING LAPAROSCOPIC (IRRIGATION / IRRIGATOR) ×4 IMPLANT
SET TUBE SMOKE EVAC HIGH FLOW (TUBING) ×4 IMPLANT
SHEARS HARMONIC ACE PLUS 36CM (ENDOMECHANICALS) IMPLANT
SLEEVE ADV FIXATION 5X100MM (TROCAR) IMPLANT
SUT PLAIN 3 0 FS 2 27 (SUTURE) IMPLANT
SUT VIC AB 4-0 PS2 27 (SUTURE) ×3 IMPLANT
SUT VIC AB 4-0 SH 27 (SUTURE) ×4
SUT VIC AB 4-0 SH 27XANBCTRL (SUTURE) ×2 IMPLANT
SUT VICRYL 0 AB UR-6 (SUTURE) ×3 IMPLANT
SUT VICRYL 0 UR6 27IN ABS (SUTURE) IMPLANT
SYS BAG RETRIEVAL 10MM (BASKET)
SYSTEM BAG RETRIEVAL 10MM (BASKET) IMPLANT
SYSTEM CARTER THOMASON II (TROCAR) ×3 IMPLANT
TOWEL OR 17X26 10 PK STRL BLUE (TOWEL DISPOSABLE) ×5 IMPLANT
TRAY FOLEY W/BAG SLVR 14FR (SET/KITS/TRAYS/PACK) ×4 IMPLANT
TRENDGUARD 450 HYBRID PRO PACK (MISCELLANEOUS) ×4
TROCAR ADV FIXATION 5X100MM (TROCAR) ×4 IMPLANT
TROCAR BLADELESS 15MM (ENDOMECHANICALS) ×3 IMPLANT
TROCAR XCEL NON-BLD 11X100MML (ENDOMECHANICALS) IMPLANT
TROCAR XCEL NON-BLD 5MMX100MML (ENDOMECHANICALS) ×4 IMPLANT
WARMER LAPAROSCOPE (MISCELLANEOUS) ×4 IMPLANT

## 2019-11-13 NOTE — Discharge Instructions (Signed)
Kathryn Park,  I found a large cyst on your left ovary.  I did remove the entire ovary.  You had a small cyst on your right tube, and I drained that cyst.  I did remove some adhesions from your right large intestine.  Everything went well.   Your urine looked like you could have some current infection.  I recommend you start taking an antibiotic called Macrobid.  You will take this twice a day for 5 days.  I will let you know when your urine culture test has returned.   I will see you in the office in one week!  Josefa Half, MD   Diagnostic Laparoscopy, Care After This sheet gives you information about how to care for yourself after your procedure. Your health care provider may also give you more specific instructions. If you have problems or questions, contact your health care provider. What can I expect after the procedure? After the procedure, it is common to have:  Mild discomfort in the abdomen.  Sore throat. Women who have laparoscopy with pelvic examination may have mild cramping and fluid coming from the vagina for a few days after the procedure. Follow these instructions at home: Medicines  Take over-the-counter and prescription medicines only as told by your health care provider.  If you were prescribed an antibiotic medicine, take it as told by your health care provider. Do not stop taking the antibiotic even if you start to feel better. Driving  Do not drive for 24 hours if you were given a medicine to help you relax (sedative) during your procedure.  Do not drive or use heavy machinery while taking prescription pain medicine. Bathing  Do not take baths, swim, or use a hot tub until your health care provider approves. You may take showers. Incision care   Follow instructions from your health care provider about how to take care of your incisions. Make sure you: ? Wash your hands with soap and water before you change your bandage (dressing). If soap and water are not  available, use hand sanitizer. ? Change your dressing as told by your health care provider. ? Leave stitches (sutures), skin glue, or adhesive strips in place. These skin closures may need to stay in place for 2 weeks or longer. If adhesive strip edges start to loosen and curl up, you may trim the loose edges. Do not remove adhesive strips completely unless your health care provider tells you to do that.  Check your incision areas every day for signs of infection. Check for: ? Redness, swelling, or pain. ? Fluid or blood. ? Warmth. ? Pus or a bad smell. Activity  Return to your normal activities as told by your health care provider. Ask your health care provider what activities are safe for you.  Do not lift anything that is heavier than 10 lb (4.5 kg), or the limit that you are told, until your health care provider says that it is safe. General instructions  To prevent or treat constipation while you are taking prescription pain medicine, your health care provider may recommend that you: ? Drink enough fluid to keep your urine pale yellow. ? Take over-the-counter or prescription medicines. ? Eat foods that are high in fiber, such as fresh fruits and vegetables, whole grains, and beans. ? Limit foods that are high in fat and processed sugars, such as fried and sweet foods.  Do not use any products that contain nicotine or tobacco, such as cigarettes and e-cigarettes. If you need  help quitting, ask your health care provider.  Keep all follow-up visits as told by your health care provider. This is important. Contact a health care provider if:  You develop shoulder pain.  You feel lightheaded or faint.  You are unable to pass gas or have a bowel movement.  You feel nauseous or you vomit.  You develop a rash.  You have redness, swelling, or pain around any incision.  You have fluid or blood coming from any incision.  Any incision feels warm to the touch.  You have pus or a bad  smell coming from any incision.  You have a fever or chills. Get help right away if:  You have severe pain.  You have vomiting that does not go away.  You have heavy bleeding from the vagina.  Any incision opens.  You have trouble breathing.  You have chest pain. Summary  After the procedure, it is common to have mild discomfort in the abdomen and a sore throat.  Check your incision areas every day for signs of infection.  Return to your normal activities as told by your health care provider. Ask your health care provider what activities are safe for you. This information is not intended to replace advice given to you by your health care provider. Make sure you discuss any questions you have with your health care provider. Document Revised: 09/02/2017 Document Reviewed: 03/16/2017   Post Anesthesia Home Care Instructions  Activity: Get plenty of rest for the remainder of the day. A responsible adult should stay with you for 24 hours following the procedure.  For the next 24 hours, DO NOT: -Drive a car -Paediatric nurse -Drink alcoholic beverages -Take any medication unless instructed by your physician -Make any legal decisions or sign important papers.  Meals: Start with liquid foods such as gelatin or soup. Progress to regular foods as tolerated. Avoid greasy, spicy, heavy foods. If nausea and/or vomiting occur, drink only clear liquids until the nausea and/or vomiting subsides. Call your physician if vomiting continues.  Special Instructions/Symptoms: Your throat may feel dry or sore from the anesthesia or the breathing tube placed in your throat during surgery. If this causes discomfort, gargle with warm salt water. The discomfort should disappear within 24 hours.  If you had a scopolamine patch placed behind your ear for the management of post- operative nausea and/or vomiting:  1. The medication in the patch is effective for 72 hours, after which it should be  removed.  Wrap patch in a tissue and discard in the trash. Wash hands thoroughly with soap and water. 2. You may remove the patch earlier than 72 hours if you experience unpleasant side effects which may include dry mouth, dizziness or visual disturbances. 3. Avoid touching the patch. Wash your hands with soap and water after contact with the patch.    Elsevier Patient Education  [employer].

## 2019-11-13 NOTE — Anesthesia Postprocedure Evaluation (Signed)
Anesthesia Post Note  Patient: Kathryn Park  Procedure(s) Performed: LAPAROSCOPIC OVARIAN CYSTECTOMY/POSSIBLE LEFT OOPHORECTOMY/ COLLECTION OF PELVIC WASHINGS/ks (Left Abdomen)     Patient location during evaluation: PACU Anesthesia Type: General Level of consciousness: awake and alert, oriented and patient cooperative Pain management: pain level controlled Vital Signs Assessment: post-procedure vital signs reviewed and stable Respiratory status: spontaneous breathing, nonlabored ventilation and respiratory function stable Cardiovascular status: blood pressure returned to baseline and stable Postop Assessment: no apparent nausea or vomiting Anesthetic complications: no    Last Vitals:  Vitals:   11/13/19 0936 11/13/19 1300  BP: 119/72 119/72  Pulse: 63 72  Resp: 14 15  Temp: 36.8 C 36.4 C  SpO2: 100% 100%    Last Pain:  Vitals:   11/13/19 1300  TempSrc:   PainSc: [place]

## 2019-11-13 NOTE — Progress Notes (Signed)
Update to History and Physical  Patient wishes to do laparoscopic removal of left pelvic mass, thought to be a large left ovarian cyst from her office ultrasound.  Will proceed with a laparoscopy with collection of pelvic washings, left ovarian cystectomy, possible left oophorectomy, possible left salpingectomy.   Risks, benefits, and alternatives reviewed with the patient who wishes to proceed.  Surgical care and recovery expectations reviewed.  Patient examined.  OK to proceed with surgery.

## 2019-11-13 NOTE — Transfer of Care (Signed)
   Last Vitals:  Vitals Value Taken Time  BP 119/72 11/13/19 1300  Temp    Pulse 65 11/13/19 1303  Resp 17 11/13/19 1303  SpO2 100 % 11/13/19 1303  Vitals shown include unvalidated device data.  Last Pain:  Vitals:   11/13/19 0936  TempSrc: Oral  PainSc: 2       Patients Stated Pain Goal: 5 (11/13/19 0936)  Immediate Anesthesia Transfer of Care Note  Patient: Kathryn Park  Procedure(s) Performed: Procedure(s) (LRB): LAPAROSCOPIC OVARIAN CYSTECTOMY/POSSIBLE LEFT OOPHORECTOMY/ COLLECTION OF PELVIC WASHINGS/ks (Left)  Patient Location: PACU  Anesthesia Type: General  Level of Consciousness: awake, alert  and oriented  Airway & Oxygen Therapy: Patient Spontanous Breathing and Patient connected to nasal cannula oxygen  Post-op Assessment: Report given to PACU RN and Post -op Vital signs reviewed and stable  Post vital signs: Reviewed and stable  Complications: No apparent anesthesia complications

## 2019-11-13 NOTE — Anesthesia Procedure Notes (Signed)
Procedure Name: Intubation Date/Time: 11/13/2019 11:33 AM Performed by: Mechele Claude, CRNA Pre-anesthesia Checklist: Patient identified, Emergency Drugs available, Suction available and Patient being monitored Patient Re-evaluated:Patient Re-evaluated prior to induction Oxygen Delivery Method: Circle system utilized Preoxygenation: Pre-oxygenation with 100% oxygen Induction Type: IV induction Ventilation: Mask ventilation without difficulty Laryngoscope Size: Mac and 3 Grade View: Grade I Tube type: Oral Tube size: 7.0 mm Number of attempts: 1 Airway Equipment and Method: Stylet and Oral airway Placement Confirmation: ETT inserted through vocal cords under direct vision,  positive ETCO2 and breath sounds checked- equal and bilateral Secured at: 21 cm Tube secured with: Tape Dental Injury: Teeth and Oropharynx as per pre-operative assessment

## 2019-11-13 NOTE — Op Note (Signed)
OPERATIVE REPORT   PREOPERATIVE DIAGNOSES:   Left ovarian cyst.  POSTOPERATIVE DIAGNOSES:   Left ovarian cyst, ascending colon adhesions.   PROCEDURES:   Laparoscopic left oophorectomy with collection of pelvic washings, lysis of adhesions.  SURGEON:  Lenard Galloway, M.D.  ASSISTANT:    Megan Salon, M.D.  ANESTHESIA:  General endotracheal, local with 0.5 % Marcaine  EBL:  10 cc.   URINE OUTPUT:   150 cc.   IV FLUIDS:   1000 cc.   COMPLICATIONS:  None.  INDICATIONS FOR THE PROCEDURE:  The patient is a 34 year old Gravida 29 Caucasian female who presented with a one year history of dysmenorrhea and dyspareunia.  She had a large pelvic mass on examination and pelvic ultrasound confirmed the presence of left ovarian cyst measuring 11 x 7 x 9 cm with a thin septation.    The  patient desires surgical care, and a plan is made to proceed now with a laparoscopic left ovarian cystectomy, possible left oophorectomy, and possible left salpingectomy with collection of pelvic washings.  Risks, benefits, and alternatives are reviewed with the patient, who wishes to proceed.  FINDINGS:  Examination under anesthesia revealed large pelvic mass filling the posterior cul de sac.   Laparoscopy demonstrated a 12 cm left ovarian cyst.  The left fallopian tube, right ovary and uterus were normal.  The right tube contained 2 paratubal cysts, one that was 5 mm and one that was 1.5 cm. The 1.5 cm paratubal cyst was drained.  The liver was adherent to the falciform ligament and the left chest wall.  The gall bladder, appendix, omentum, and peritoneal surfaces were normal. The ascending colon was adherent to the left anterior and left sidewall.  There was no evidence of ascites.  The ureters were noted to peristalse along the left pelvic sidewall.   SPECIMENS:  Pelvic washing were sent to pathology as one specimen, and the left ovary was sent as a second specimen.  A urine culture was sent from the urine in  the Foley bag.  DESCRIPTION OF PROCEDURE:  The patient was reidentified in the preoperative hold area.  She received TED hose and PAS stockings for DVT prophylaxis.  The patient was transferred to the operating room where she was placed in the dorsal lithotomy position with Allen stirrups.  General endotracheal anesthesia was induced.  The Trendgard was used to support the patient on the OR table.   An examination under anesthesia was performed.  See the findings above.  The patient's lower abdomen, vagina and perineum were then sterilely prepped and she was draped.  A Foley catheter was sterilely placed inside the bladder and left to gravity drainage throughout the procedure and was removed at the termination of the procedure.  A single tooth tenaculum was placed on the anterior cervical lip, and then a Hulka tenaculum was placed in the uterus and was removed at the end of the case.  Attention was turned to the abdomen where the umbilical region was injected with 0.5 % Marcaine and a small incision created.  Penetrating towel clips were used to raise the anterior abdominal wall.  A Veress needle was then used to insufflate the abdomen with CO2 gas after a saline drop test was performed and the fluid flowed freely.   A 5 mm camera port was then placed using the Optiview.  A 10 mm incision was created in the left lower quadrant, and a 5 mm incision was created in the right  lower abdomen after the skin was injected locally with 0.5 % Marcaine. Respective trocars were then placed under visualization of the laparoscope.      The patient was placed in Trendelenburg position.  An inspection of the abdomen and pelvis was performed. The findings are as noted above.   Pelvic washings were performed with the Nezhat suction irrigator and sent to pathology.  The left ureter was identified along the left pelvic sidewall.  The left fallopian tube was dissected away from the left ovary by using the Harmonic  scalpel.  The left utero-ovarian ligament was then grasped, cauterized, and bisected with the Ligasure.   The left left infundibulopelvic ligament was then similarly grasped,cauterized, and cut with the same instrument.    The left lower quadrant incision was enlarged with a scalpel, and a 15 mm laparoscopic port was placed under visualization of the laparoscope. The laparoscopic endosurgical bag was placed inside the peritoneal cavity, and the ovary as placed inside the bag in an intact state. The bag and trocar were then pulled up through the incision.  The ovary was incised inside the bag, and the Nezhat device was used the suction out the cyst fluid.  The ovary was removed from the bag and sent to pathology.    The trocar was replaced under direct visualization of the laparoscope.   Attention was turned to the patient's right fallopian tube, and the larger Hydatid cyst was drained with the Harmonic scalpel.  The adhesions which held the ascending colon to the right anterior abdominal wall were lysed with the Harmonic scalpel.  The bowel was respected during this dissection.   The pneumoperitoneum was decreased, and hemostasis was good.    The right lower abdominal trocar was removed and the fascia was closed with a running suture of 0/0 Vicryl.  The laparoscope was removed.  The pneumoperitoneum was released, and the patient received manual breaths to remove any remaining CO2 gas.  The remaining trocars were removed.   The trocar site incisions were closed with subcuticular sutures of 4/0 Vicryl.  All incisions were then closed with Dermabond.   The Hulka tenaculum was removed from the uterus, and the Foley catheter was removed.  The urine was noted to be cloudy, and this was sent for a urine culture from the operating room.   The patient was awakened and extubated, and escorted to the recovery room in stable condition.  There were no complications.  All needle, instrument, and sponge counts  were correct.   Lenard Galloway, M.D.

## 2019-11-13 NOTE — Anesthesia Preprocedure Evaluation (Addendum)
Anesthesia Evaluation  Patient identified by MRN, date of birth, ID band Patient awake  General Assessment Comment:Admits to some emergence delirium in the past   Reviewed: Allergy & Precautions, NPO status , Patient's Chart, lab work & pertinent test results  Airway Mallampati: II  TM Distance: >3 FB Neck ROM: Full    Dental no notable dental hx.    Pulmonary asthma , former smoker,  Asthma as a child   Pulmonary exam normal breath sounds clear to auscultation       Cardiovascular negative cardio ROS Normal cardiovascular exam Rhythm:Regular Rate:Normal     Neuro/Psych  Headaches, PSYCHIATRIC DISORDERS Anxiety Depression    GI/Hepatic negative GI ROS, Neg liver ROS,   Endo/Other  negative endocrine ROS  Renal/GU negative Renal ROS  Female GU complaint Left ovarian cyst    Musculoskeletal negative musculoskeletal ROS (+)   Abdominal   Peds negative pediatric ROS (+)  Hematology negative hematology ROS (+)   Anesthesia Other Findings   Reproductive/Obstetrics negative OB ROS                            Anesthesia Physical Anesthesia Plan  ASA: II  Anesthesia Plan: General   Post-op Pain Management:    Induction: Intravenous  PONV Risk Score and Plan: 4 or greater and Ondansetron, Dexamethasone, Midazolam, Scopolamine patch - Pre-op and Treatment may vary due to age or medical condition  Airway Management Planned: Oral ETT  Additional Equipment: None  Intra-op Plan:   Post-operative Plan: Extubation in OR  Informed Consent: I have reviewed the patients History and Physical, chart, labs and discussed the procedure including the risks, benefits and alternatives for the proposed anesthesia with the patient or authorized representative who has indicated his/her understanding and acceptance.     Dental advisory given  Plan Discussed with: CRNA  Anesthesia Plan Comments:  (precedex for emergence delirium )       Anesthesia Quick Evaluation

## 2019-11-14 LAB — URINE CULTURE: Culture: NO GROWTH

## 2019-11-14 LAB — CYTOLOGY - NON PAP

## 2019-11-15 ENCOUNTER — Telehealth: Payer: Self-pay | Admitting: Obstetrics and Gynecology

## 2019-11-15 ENCOUNTER — Telehealth: Payer: Self-pay

## 2019-11-15 DIAGNOSIS — D271 Benign neoplasm of left ovary: Secondary | ICD-10-CM

## 2019-11-15 DIAGNOSIS — D3912 Neoplasm of uncertain behavior of left ovary: Secondary | ICD-10-CM

## 2019-11-15 LAB — SURGICAL PATHOLOGY

## 2019-11-15 NOTE — Telephone Encounter (Signed)
Patient is returning call to Jill. °

## 2019-11-15 NOTE — Telephone Encounter (Signed)
-----   Message from Nunzio Cobbs, MD sent at 11/15/2019  3:20 PM EST ----- Results discussed with the patient personally by phone.  Please set up referral to [place], first available at the [facility].  Hi Kathryn Park,   We just discussed results by phone.  I will be referring you to a subspecialist called a gynecologic oncologist at the [facility].   I will see you for your post op visit next week.   Please contact the office for any questions.   Josefa Half, MD

## 2019-11-15 NOTE — Telephone Encounter (Signed)
Left message to call Kemper Heupel, RN at [facility] [phone].   

## 2019-11-15 NOTE — Telephone Encounter (Signed)
Patient want to discuss wound care and when she can take a shower.

## 2019-11-15 NOTE — Telephone Encounter (Signed)
Spoke with patient. Patient is s/p LAPAROSCOPIC OVARIAN CYSTECTOMY/POSSIBLE LEFT OOPHORECTOMY/ COLLECTION OF PELVIC WASHINGS on 11/13/19. Patient is asking when she can shower?   Advised patient she may shower now. Use mild soap such as Dial, no scrubbing, pat dry incisions. No tub baths until f/u with Dr. Quincy Simmonds on 2/16. Questions answered.   Routing to provider for final review. Patient is agreeable to disposition. Will close encounter.

## 2019-11-16 ENCOUNTER — Telehealth: Payer: Self-pay | Admitting: *Deleted

## 2019-11-16 NOTE — Telephone Encounter (Signed)
Called the patient and scheduled a new patient appt. Gave the address and phone number of the clinic. Gave the policy for mask, visitors and parking

## 2019-11-16 NOTE — Telephone Encounter (Signed)
Spoke with Kathryn Park Dr.Rossi's office. Records sent to provider for review and scheduling. Patient will be notified of appointment date and time by Dr.Rossi's office. Kathryn Park will notify our office of appointment date and time once this is scheduled. Patient has been notified of this and is agreeable.  Cc: Kathryn Park

## 2019-11-19 ENCOUNTER — Other Ambulatory Visit: Payer: Self-pay

## 2019-11-19 NOTE — Progress Notes (Signed)
GYNECOLOGY  VISIT   HPI: 34 y.o.   Married  Caucasian  female   [id number] with Patient's last menstrual period was 11/05/2019 (exact date).   here for 1 week follow up LAPAROSCOPIC OVARIAN CYSTECTOMY/POSSIBLE LEFT OOPHORECTOMY/ COLLECTION OF PELVIC WASHINGS/ks (Left Abdomen).  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  Pelvic washings negative.   Feeling tired but otherwise ok.   She will see Dr. Berline Lopes, GYN ONC, in 2 days.     GYNECOLOGIC HISTORY: Patient's last menstrual period was 11/05/2019 (exact date). Contraception:  None. Menopausal hormone therapy:  none Last mammogram: 10-11-19 Diag.Bil./Stable probably benign right breast asymmetry/density D/bil.Diag.in 82Yr./BiRads3 Last pap smear: 03-28-18 Neg:Neg HR HPV, 2015 ?Neg        OB History    Gravida  0   Para  0   Term  0   Preterm  0   AB  0   Living  0     SAB  0   TAB  0   Ectopic  0   Multiple  0   Live Births  0              There are no problems to display for this patient.   Past Medical History:  Diagnosis Date  . Abnormal Pap smear of cervix   . Anxiety   . Asthma    childhood  . Depression   . Dysmenorrhea   . History of iron deficiency anemia    age 43  . Joint pain   . Leukocytosis   . Low vitamin D level   . Migraine with aura   . Ovarian cyst    Left  . Pneumonia 01/2012    left lower lobe  . Pre-diabetes   . STD (sexually transmitted disease)    HSV1  . Wears contact lenses   . Wears glasses     Past Surgical History:  Procedure Laterality Date  . COLPOSCOPY    . LAPAROSCOPIC OVARIAN CYSTECTOMY Left 11/13/2019   Procedure: LAPAROSCOPIC OVARIAN CYSTECTOMY/POSSIBLE LEFT OOPHORECTOMY/ COLLECTION OF PELVIC WASHINGS/ks;  Surgeon: Nunzio Cobbs, MD;  Location: [facility];  Service: Gynecology;  Laterality: Left;  Possible left oophorectomy Colection of pelvic washings To follow first case of the day  . TONSILLECTOMY     . UPPER GI ENDOSCOPY      Current Outpatient Medications  Medication Sig Dispense Refill  . Cholecalciferol (VITAMIN D-3) 25 MCG (1000 UT) CAPS Take by mouth. 50 mcg    . ibuprofen (ADVIL) 800 MG tablet Take 1 tablet (800 mg total) by mouth every 8 (eight) hours as needed for mild pain. 30 tablet 1  . nitrofurantoin, macrocrystal-monohydrate, (MACROBID) 100 MG capsule Take 1 capsule (100 mg total) by mouth 2 (two) times daily for 7 days. 10 capsule 0  . valACYclovir (VALTREX) 1000 MG tablet Take 2 tablets (2000 mg) by mouth twice a day for 24 hours. (Patient taking differently: as needed. Take 2 tablets (2000 mg) by mouth twice a day for 24 hours.) 30 tablet 1  . HYDROcodone-acetaminophen (NORCO/VICODIN) 5-325 MG tablet Take 1 tablet by mouth every 4 (four) hours as needed for moderate pain. (Patient not taking: Reported on 11/20/2019) 20 tablet 0   No current facility-administered medications for this visit.     ALLERGIES: Shellfish allergy, Latex, and Sulfa antibiotics  Family History  Problem Relation Age of Onset  . Diabetes Mother   . Hypertension Mother   .  Stroke Mother        TIA  . Factor V Leiden deficiency Mother   . Other Father        kidney tumor  . Cancer Father        cancerous tumor of kidney  . Crohn's disease Father   . Breast cancer Maternal Grandmother   . Stroke Maternal Grandfather     Social History   Socioeconomic History  . Marital status: Married    Spouse name: Not on file  . Number of children: Not on file  . Years of education: Not on file  . Highest education level: Not on file  Occupational History  . Not on file  Tobacco Use  . Smoking status: Former Smoker    Packs/day: 1.00    Types: Cigarettes  . Smokeless tobacco: Never Used  Substance and Sexual Activity  . Alcohol use: Not Currently  . Drug use: No  . Sexual activity: Yes    Partners: Male    Birth control/protection: Condom, Pill    Comment: everytime  Other Topics  Concern  . Not on file  Social History Narrative  . Not on file   Social Determinants of Health   Financial Resource Strain:   . Difficulty of Paying Living Expenses: Not on file  Food Insecurity:   . Worried About Charity fundraiser in the Last Year: Not on file  . Ran Out of Food in the Last Year: Not on file  Transportation Needs:   . Lack of Transportation (Medical): Not on file  . Lack of Transportation (Non-Medical): Not on file  Physical Activity:   . Days of Exercise per Week: Not on file  . Minutes of Exercise per Session: Not on file  Stress:   . Feeling of Stress : Not on file  Social Connections:   . Frequency of Communication with Friends and Family: Not on file  . Frequency of Social Gatherings with Friends and Family: Not on file  . Attends Religious Services: Not on file  . Active Member of Clubs or Organizations: Not on file  . Attends Archivist Meetings: Not on file  . Marital Status: Not on file  Intimate Partner Violence:   . Fear of Current or Ex-Partner: Not on file  . Emotionally Abused: Not on file  . Physically Abused: Not on file  . Sexually Abused: Not on file    Review of Systems  All other systems reviewed and are negative.   PHYSICAL EXAMINATION:    BP 104/62   Pulse 88   Temp (!) 97.2 F (36.2 C) (Temporal)   Ht 5\' 7"  (1.702 m)   Wt 134 lb 3.2 oz (60.9 kg)   LMP 11/05/2019 (Exact Date)   BMI 21.02 kg/m     General appearance: alert, cooperative and appears stated age   Abdomen: soft, non-tender, no masses,  no organomegaly.  Incisions intact.    Pelvic: External genitalia:  no lesions              Urethra:  normal appearing urethra with no masses, tenderness or lesions              Bartholins and Skenes: normal                 Vagina: normal appearing vagina with normal color and discharge, no lesions              Cervix: no lesions  Bimanual Exam:  Uterus:  normal size, contour, position, consistency,  mobility, non-tender              Adnexa: no mass, fullness, tenderness              Chaperone was present for exam.  ASSESSMENT  Status post laparoscopic right oophorectomy, drainage of right paratubal cyst, collection of pelvic washings, lysis of adhesions.  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   PLAN  We reviewed her surgical findings, care,  and pathology report.  She will se GYN ONC this week.  No lifting over 10 - 15 pounds for at least 4 weeks.  Fu for annual exam and prn.    An After Visit Summary was printed and given to the patient.

## 2019-11-19 NOTE — Progress Notes (Signed)
GYNECOLOGIC ONCOLOGY NEW PATIENT CONSULTATION   Patient Name: Kathryn Park  Patient Age: 34 y.o. Date of Service: 10/22/19 Referring Provider: No referring provider defined for this encounter.   Primary Care Provider: Patient, No Pcp Per Consulting Provider: Jeral Pinch, MD   Assessment/Plan:  [phone] with surgically unstaged granulosa cell tumor of the left ovary.  I spent some time discussing with Aaima and her husband the diagnosis and typical treatment of GCT. While she did not undergo staging at the time of her oophorectomy, based on normal pelvic/abdominal survey as well as negative washings, I am presuming that she has stage IA disease. We discussed the rarity of this tumor type and the overall excellent survival for stage I disease.   Given the low risk of lymph node involvement, routine lymphadenectomy is not recommended if LNs are not enlarged or abnormal appearing. I think that the likelihood of peritoneal biopsies or omentectomy will show metastatic disease is low, especially in the setting of normal imaging. I recommend that we proceed with CT of the abdomen and pelvis. If negative for any evidence of metastatic disease, I think the morbidity of a second surgery outweighs the benefit of completing traditional staging (omentectomy and peritoneal biopsies).   Giulia and her husband had previously decided not to pursue pregnancy which was confirmed in our conversation today. While the surgical treatment in older patients would include BSO and hysterectomy, I recommend that we delay removal of the other ovary and uterus until Anastasiya is 40-45. While she understands the risk of recurrence, I increased morbidity and mortality associated with putting her into surgical menopause at such a young age. Given the possible production of estrogen by GCT, we reviewed the importance of letting me know if she has changes in her menses (which would prompt endometrial sampling). Malay  also asked about the increased area of density in her breast (being followed radiographically) - given that GCT commonly secrete estrogen, we discussed that it is possible that an increase in estrogen could have cause or contributed to these breast changes.   Although inhibin B has a very short half life, we will plan to get a baseline inhibin B and AMH today, which can both be elevated and useful tumor markers in granulosa cell tumor.   I will call Azumi with the results of her CT and we will plan for surveillance visits every 3 months as long as there are no concerning findings on her imaging.    A copy of this note was sent to the patient's referring provider.   55 minutes of total time was spent for this patient encounter, including preparation, face-to-face counseling with the patient and coordination of care, and documentation of the encounter.   Jeral Pinch, MD  Division of Gynecologic Oncology  Department of Obstetrics and Gynecology  [facility]  ___________________________________________  Chief Complaint: Chief Complaint  Patient presents with  . Granulosa cell tumor of left ovary    History of Present Illness:  Kathryn Park is a 34 y.o. y.o. female who is seen in consultation at the request of No ref. provider found for an evaluation of granulosa cell tumor diagnosed on final pathology after the patient underwent laparoscopic left oophorectomy for a symptomatic adnexal mass.  Kathryn Park reports overall doing well since surgery.  She saw Dr. Quincy Simmonds on Tuesday for follow-up.  She endorses a good appetite without nausea or emesis now that she is off of Vicodin.  She is continuing to have  some constipation for which she uses MiraLAX.  She denies any urinary symptoms.  She denies any fevers or chills.  Prior to surgery, her mother got sick in November 2019 and she was with her helping to care for her until her death in 02-17-19.  Sometime  during that 73-month time period,  She began to have urinary retention, frequency, incomplete bladder emptying, and a heaviness in her pelvis.  She also began noticing abdominal bloating.  Additionally, for at least the last year, she has had increasing dysmenorrhea every other month with associated nausea and emesis.  She has had some change to the length of her bleeding but denies any intermenstrual bleeding.  Her periods continue to be regular.  PAST MEDICAL HISTORY:  Past Medical History:  Diagnosis Date  . Abnormal Pap smear of cervix   . Anxiety   . Asthma    childhood  . Depression   . Dysmenorrhea   . History of iron deficiency anemia    age 83  . Joint pain   . Leukocytosis   . Low vitamin D level   . Migraine with aura   . Ovarian cyst    Left  . Pneumonia 02/17/2012    left lower lobe  . Pre-diabetes   . STD (sexually transmitted disease)    HSV1  . Wears contact lenses   . Wears glasses      PAST SURGICAL HISTORY:  Past Surgical History:  Procedure Laterality Date  . COLPOSCOPY    . LAPAROSCOPIC OVARIAN CYSTECTOMY Left 11/13/2019   Procedure: LAPAROSCOPIC OVARIAN CYSTECTOMY/POSSIBLE LEFT OOPHORECTOMY/ COLLECTION OF PELVIC WASHINGS/ks;  Surgeon: Nunzio Cobbs, MD;  Location[facility];  Service: Gynecology;  Laterality: Left;  Possible left oophorectomy Colection of pelvic washings To follow first case of the day  . TONSILLECTOMY    . UPPER GI ENDOSCOPY      OB/GYN HISTORY:  OB History  Gravida Para Term Preterm AB Living  0 0 0 0 0 0  SAB TAB Ectopic Multiple Live Births  0 0 0 0 0    Patient's last menstrual period was 11/05/2019 (exact date).  Age at menarche: 57  Age at menopause: n/a Hx of STDs: denies Last pap: 03/2018, negative  SCREENING STUDIES:  Last mammogram: 10/2019    MEDICATIONS: Outpatient Encounter Medications as of 11/22/2019  Medication Sig  . Cholecalciferol (VITAMIN D-3) 25 MCG (1000 UT) CAPS Take by  mouth. 50 mcg  . ibuprofen (ADVIL) 800 MG tablet Take 1 tablet (800 mg total) by mouth every 8 (eight) hours as needed for mild pain.  . valACYclovir (VALTREX) 1000 MG tablet Take 2 tablets (2000 mg) by mouth twice a day for 24 hours. (Patient taking differently: as needed. Take 2 tablets (2000 mg) by mouth twice a day for 24 hours.)  . [EXPIRED] nitrofurantoin, macrocrystal-monohydrate, (MACROBID) 100 MG capsule Take 1 capsule (100 mg total) by mouth 2 (two) times daily for 7 days.  . [DISCONTINUED] HYDROcodone-acetaminophen (NORCO/VICODIN) 5-325 MG tablet Take 1 tablet by mouth every 4 (four) hours as needed for moderate pain. (Patient not taking: Reported on 11/20/2019)   No facility-administered encounter medications on file as of 11/22/2019.    ALLERGIES:  Allergies  Allergen Reactions  . Shellfish Allergy Anaphylaxis    Throat closes  . Latex Rash  . Sulfa Antibiotics Rash     FAMILY HISTORY:  Family History  Problem Relation Age of Onset  . Diabetes Mother   .  Hypertension Mother   . Stroke Mother        TIA  . Factor V Leiden deficiency Mother   . Other Father        kidney tumor  . Cancer Father        cancerous tumor of kidney  . Crohn's disease Father   . Breast cancer Maternal Grandmother   . Stroke Maternal Grandfather   . Ovarian cancer Neg Hx   . Endometrial cancer Neg Hx   . Colon cancer Neg Hx      SOCIAL HISTORY:    Social Connections:   . Frequency of Communication with Friends and Family: Not on file  . Frequency of Social Gatherings with Friends and Family: Not on file  . Attends Religious Services: Not on file  . Active Member of Clubs or Organizations: Not on file  . Attends Archivist Meetings: Not on file  . Marital Status: Not on file    REVIEW OF SYSTEMS:  + anxiety, fatigue Denies appetite changes, fevers, chills, unexplained weight changes. Denies hearing loss, neck lumps or masses, mouth sores, ringing in ears or voice  changes. Denies cough or wheezing.  Denies shortness of breath. Denies chest pain or palpitations. Denies leg swelling. Denies abdominal distention, pain, blood in stools, constipation, diarrhea, nausea, vomiting, or early satiety. Denies pain with intercourse, dysuria, frequency, hematuria or incontinence. Denies hot flashes, pelvic pain, vaginal bleeding or vaginal discharge.   Denies joint pain, back pain or muscle pain/cramps. Denies itching, rash, or wounds. Denies dizziness, headaches, numbness or seizures. Denies swollen lymph nodes or glands, denies easy bruising or bleeding. Denies depression, confusion, or decreased concentration.  Physical Exam:  Vital Signs for this encounter:  Blood pressure (!) 159/92, pulse 78, temperature 97.8 F (36.6 C), temperature source Temporal, resp. rate 20, height 5\' 7"  (1.702 m), weight 140 lb (63.5 kg), last menstrual period 11/05/2019, SpO2 100 %. Body mass index is 21.93 kg/m. General: Alert, oriented, no acute distress.  HEENT: Normocephalic, atraumatic. Sclera anicteric.  Chest: Clear to auscultation bilaterally. No wheezes, rhonchi, or rales. Cardiovascular: Regular rate and rhythm, no murmurs, rubs, or gallops.  Extremities: Grossly normal range of motion. Warm, well perfused. No edema bilaterally.  Skin: No rashes or lesions.  GU: Deferred.  LABORATORY AND RADIOLOGIC DATA:  Outside medical records were reviewed to synthesize the above history, along with the history and physical obtained during the visit.   Lab Results  Component Value Date   WBC 7.8 11/09/2019   HGB 14.0 11/09/2019   HCT 43.3 11/09/2019   PLT 237 11/09/2019   GLUCOSE 93 10/11/2019   CHOL 142 10/11/2019   TRIG 29 10/11/2019   HDL 64 10/11/2019   LDLCALC 70 10/11/2019   ALT 9 10/11/2019   AST 11 10/11/2019   NA 139 10/11/2019   K 4.4 10/11/2019   CL 103 10/11/2019   CREATININE 0.83 10/11/2019   BUN 12 10/11/2019   CO2 23 10/11/2019   TSH 2.380  10/11/2019   11/13/19: FINAL MICROSCOPIC DIAGNOSIS:   A. OVARY, LEFT, OOPHORECTOMY:  - Granulosa cell tumor, 2.1 cm.  - Cystic mucinous neoplasm consistent with mucinous cystadenoma.  - See oncology table and comment.   ONCOLOGY TABLE:  OVARY or FALLOPIAN TUBE or PRIMARY PERITONEUM:  Procedure: Left oophorectomy.  Specimen Integrity: Intact, collapsed cyst.   Tumor Site: Left ovary.  Ovarian Surface Involvement: Not identified.  Tumor Size: 2.1 x 1.4 x 1 cm.  Histologic Type: Granulosa cell tumor,  adult type.  Histologic Grade: Low-grade.  Other Tissue/ Organ Involvement: Not identified.  Peritoneal/Ascitic Fluid: No malignant cells.  Treatment Effect: No known presurgical therapy.  Regional Lymph Nodes: No lymph nodes submitted.  Pathologic Stage Classification (pTNM, AJCC 8th Edition): pT1a, pNX  Representative Tumor Block: A5, A6 and A7.   Comment(s): The ovary shows a large collapsed cystic mucinous neoplasm  with features consistent with mucinous cystadenoma. In the wall of the  cyst there is a 2.1 cm neoplasm characterized by cells with scant  cytoplasm and angulated nuclei with nuclear grooves. There are a few  foci with Call-Exner bodies. Immunohistochemistry shows the tumor is  positive with inhibin, cytokeratin AE1/AE3 and CD56 with focal  positivity for WT1. Calretinin shows focal, patchy positivity. The  tumor is negative with cytokeratin 7, cytokeratin 20, chromogranin,  synaptophysin and CDX2. The morphology and immunophenotype are  consistent with adult type granulosa cell tumor.    Pelvic ultrasound from [facility] on 1/14: Uterus 10 x 5.7 x 4.33 cm with endometrial thickness of 11.59mm. Left ovary measures 8.7 x 7.6 x 7.1 cm. Cyst measures 11.2 x 7.1 x 9.3 cm. Cyst is avascular, echo free with thin septation.  Right ovary measures 3.5 x 3.5 x 1.5 cm. No free fluid seen.

## 2019-11-20 ENCOUNTER — Ambulatory Visit (INDEPENDENT_AMBULATORY_CARE_PROVIDER_SITE_OTHER): Payer: BC Managed Care – PPO | Admitting: Obstetrics and Gynecology

## 2019-11-20 ENCOUNTER — Encounter: Payer: Self-pay | Admitting: Obstetrics and Gynecology

## 2019-11-20 VITALS — BP 104/62 | HR 88 | Temp 97.2°F | Ht 67.0 in | Wt 134.2 lb

## 2019-11-20 DIAGNOSIS — D271 Benign neoplasm of left ovary: Secondary | ICD-10-CM

## 2019-11-20 DIAGNOSIS — Z9889 Other specified postprocedural states: Secondary | ICD-10-CM

## 2019-11-20 DIAGNOSIS — D3912 Neoplasm of uncertain behavior of left ovary: Secondary | ICD-10-CM

## 2019-11-21 ENCOUNTER — Telehealth: Payer: Self-pay | Admitting: *Deleted

## 2019-11-21 NOTE — Telephone Encounter (Signed)
Called the patient and confirmed that she is keeping her appt for tomorrow. Explained that we may have to call tomorrow and reschedule if Dr Berline Lopes can't make it in.

## 2019-11-22 ENCOUNTER — Encounter: Payer: Self-pay | Admitting: Gynecologic Oncology

## 2019-11-22 ENCOUNTER — Inpatient Hospital Stay: Payer: BC Managed Care – PPO

## 2019-11-22 ENCOUNTER — Inpatient Hospital Stay: Payer: BC Managed Care – PPO | Attending: Gynecologic Oncology | Admitting: Gynecologic Oncology

## 2019-11-22 ENCOUNTER — Other Ambulatory Visit: Payer: Self-pay

## 2019-11-22 VITALS — BP 159/92 | HR 78 | Temp 97.8°F | Resp 20 | Ht 67.0 in | Wt 140.0 lb

## 2019-11-22 DIAGNOSIS — D3912 Neoplasm of uncertain behavior of left ovary: Secondary | ICD-10-CM

## 2019-11-22 NOTE — Patient Instructions (Signed)
It was great to meet you today.  I will call you with the results of your CT scan once they are back.  As long as there is nothing concerning on the imaging, I will plan to see you back in clinic in 3 months.  Please not hesitate to call if anything changes before then at [phone].

## 2019-11-26 ENCOUNTER — Other Ambulatory Visit: Payer: Self-pay | Admitting: Oncology

## 2019-11-26 NOTE — Progress Notes (Signed)
Gynecologic Oncology Multi-Disciplinary Disposition Conference Note  Date of the Conference: 11/26/2019  Patient Name: Kathryn Park  Referring Provider: Dr. Quincy Simmonds Primary GYN Oncologist: Dr. Berline Lopes  Stage/Disposition:  Presumed stage Ia granulosa cell of the left ovary. Disposition is to CT imaging and close surveillance.   This Multidisciplinary conference took place involving physicians from [place], [place], Radiation Oncology, Pathology, Radiology along with the Gynecologic Oncology Nurse Practitioner and RN.  Comprehensive assessment of the patient's malignancy, staging, need for surgery, chemotherapy, radiation therapy, and need for further testing were reviewed. Supportive measures, both inpatient and following discharge were also discussed. The recommended plan of care is documented. Greater than 35 minutes were spent correlating and coordinating this patient's care.

## 2019-11-27 LAB — INHIBIN B: Inhibin B: 83 pg/mL

## 2019-11-28 LAB — ANTI MULLERIAN HORMONE: ANTI-MULLERIAN HORMONE (AMH): 1.07 ng/mL

## 2019-11-29 ENCOUNTER — Other Ambulatory Visit: Payer: Self-pay

## 2019-11-29 ENCOUNTER — Telehealth: Payer: Self-pay | Admitting: Oncology

## 2019-11-29 ENCOUNTER — Ambulatory Visit (HOSPITAL_COMMUNITY)
Admission: RE | Admit: 2019-11-29 | Discharge: 2019-11-29 | Disposition: A | Discharge status: Home / Self Care | Payer: BC Managed Care – PPO | Source: Ambulatory Visit | Attending: Gynecologic Oncology | Admitting: Gynecologic Oncology

## 2019-11-29 DIAGNOSIS — D3912 Neoplasm of uncertain behavior of left ovary: Secondary | ICD-10-CM | POA: Diagnosis not present

## 2019-11-29 MED ORDER — IOHEXOL 300 MG/ML  SOLN
100.0000 mL | Freq: Once | INTRAMUSCULAR | Status: AC | PRN
  Administered 2019-11-29: 100 mL via INTRAVENOUS

## 2019-11-29 NOTE — Telephone Encounter (Signed)
Called [facility] and scheduled genetic counseling for 12/04/19 at 9 am.

## 2019-12-04 ENCOUNTER — Ambulatory Visit (HOSPITAL_BASED_OUTPATIENT_CLINIC_OR_DEPARTMENT_OTHER): Payer: BC Managed Care – PPO | Admitting: Genetic Counselor

## 2019-12-04 ENCOUNTER — Encounter: Payer: Self-pay | Admitting: Genetic Counselor

## 2019-12-04 DIAGNOSIS — Z8051 Family history of malignant neoplasm of kidney: Secondary | ICD-10-CM

## 2019-12-04 DIAGNOSIS — Z803 Family history of malignant neoplasm of breast: Secondary | ICD-10-CM

## 2019-12-04 DIAGNOSIS — Z1379 Encounter for other screening for genetic and chromosomal anomalies: Secondary | ICD-10-CM

## 2019-12-04 DIAGNOSIS — D3912 Neoplasm of uncertain behavior of left ovary: Secondary | ICD-10-CM

## 2019-12-04 NOTE — Progress Notes (Signed)
REFERRING PROVIDER: Dorothyann Gibbs, NP [place],  [place] [address]  PRIMARY PROVIDER:  Patient, No Pcp Per  PRIMARY REASON FOR VISIT:  1. Granulosa cell tumor of left ovary   2. Family history of breast cancer   3. Family history of kidney cancer      I connected with Kathryn Park on 12/04/2019 at 9:00am EDT by [place] video conference and verified that I am speaking with the correct person using two identifiers.   Patient location: Home Provider location: [facility] office  HISTORY OF PRESENT ILLNESS:   Kathryn Park, a 34 y.o. female, was seen for a [place] cancer genetics consultation at the request of Dr. Elinor Parkinson due to a personal history of granulosa cell tumor of the left ovary, and a family history of breast and kidney cancer.  Kathryn Park presents to clinic today to discuss the possibility of a hereditary predisposition to cancer, genetic testing, and to further clarify her future cancer risks, as well as potential cancer risks for family members.   In 2021, at the age of 34, Kathryn Park was diagnosed with granulosa cell tumor, adult type, of the left ovary. The treatment plan included a left oophorectomy, which was performed on 11/13/2019.    CANCER HISTORY:  Oncology History   No history exists.     RISK FACTORS:  Menarche was at age 31.  No live births.  OCP use for approximately 6-7 years.  Ovaries intact: no - left ovary removed 11/13/19.  Hysterectomy: no.  Menopausal status: premenopausal.  HRT use: 0 years. Colonoscopy: no; upper endoscopy normal in 2008. Mammogram within the last year: yes. Number of breast biopsies: 0. Any excessive radiation exposure in the past: no  Past Medical History:  Diagnosis Date  . Abnormal Pap smear of cervix   . Anxiety   . Asthma    childhood  . Depression   . Dysmenorrhea   . Family history of breast cancer   . Family history of kidney cancer   . History of iron deficiency anemia    age 75  . Joint pain    . Leukocytosis   . Low vitamin D level   . Migraine with aura   . Ovarian cyst    Left  . Pneumonia 01/2012    left lower lobe  . Pre-diabetes   . STD (sexually transmitted disease)    HSV1  . Wears contact lenses   . Wears glasses     Past Surgical History:  Procedure Laterality Date  . COLPOSCOPY    . LAPAROSCOPIC OVARIAN CYSTECTOMY Left 11/13/2019   Procedure: LAPAROSCOPIC OVARIAN CYSTECTOMY/POSSIBLE LEFT OOPHORECTOMY/ COLLECTION OF PELVIC WASHINGS/ks;  Surgeon: Nunzio Cobbs, MD;  Location: [facility];  Service: Gynecology;  Laterality: Left;  Possible left oophorectomy Colection of pelvic washings To follow first case of the day  . TONSILLECTOMY    . UPPER GI ENDOSCOPY      Social History   Socioeconomic History  . Marital status: Married    Spouse name: Not on file  . Number of children: Not on file  . Years of education: Not on file  . Highest education level: Not on file  Occupational History  . Not on file  Tobacco Use  . Smoking status: Former Smoker    Packs/day: 1.00    Types: Cigarettes  . Smokeless tobacco: Never Used  Substance and Sexual Activity  . Alcohol use: Not Currently  . Drug use: No  .  Sexual activity: Yes    Partners: Male    Birth control/protection: Condom, Pill    Comment: everytime  Other Topics Concern  . Not on file  Social History Narrative  . Not on file   Social Determinants of Health   Financial Resource Strain:   . Difficulty of Paying Living Expenses: Not on file  Food Insecurity:   . Worried About Charity fundraiser in the Last Year: Not on file  . Ran Out of Food in the Last Year: Not on file  Transportation Needs:   . Lack of Transportation (Medical): Not on file  . Lack of Transportation (Non-Medical): Not on file  Physical Activity:   . Days of Exercise per Week: Not on file  . Minutes of Exercise per Session: Not on file  Stress:   . Feeling of Stress : Not on file  Social  Connections:   . Frequency of Communication with Friends and Family: Not on file  . Frequency of Social Gatherings with Friends and Family: Not on file  . Attends Religious Services: Not on file  . Active Member of Clubs or Organizations: Not on file  . Attends Archivist Meetings: Not on file  . Marital Status: Not on file     FAMILY HISTORY:  We obtained a detailed, 4-generation family history.  Significant diagnoses are listed below: Family History  Problem Relation Age of Onset  . Diabetes Mother   . Hypertension Mother   . Stroke Mother        TIA  . Factor V Leiden deficiency Mother   . Other Mother        pituitary tumor  . Crohn's disease Father   . Kidney cancer Father        cancerous tumor of kidney  . Breast cancer Maternal Grandmother        dx. 59s  . Stroke Maternal Grandfather   . Other Brother        ear tumor, dx. age 77-6  . Ovarian cancer Neg Hx   . Endometrial cancer Neg Hx   . Colon cancer Neg Hx    Kathryn Park does not have children. She has one brother who is 9 and had a history of a tumor in his left ear removed when he was 56 or 35 years old, but no other tumors or cancer since.  Kathryn Park's mother died in Feb 05, 2019 at the age of 24. She did not have cancer, although she did have a pituitary tumor identified around the age of 29. Kathryn Park has one maternal uncle who is approximately 50 and has not had cancer. Her maternal grandmother is currently living in her 49s and has a history of breast cancer diagnosed in her early 32s, which was treated with surgery. Her maternal grandfather died in his 44s and did not have cancer, although he did have a history of multiple strokes.  Kathryn Park's father Herbie [place] "Karn Pickler" Fore) is currently living at age 30, and had a history of kidney cancer diagnosed a couple years ago at age 41. This kidney cancer was confined to one kidney and Kathryn Park is unaware of its histology type. Kathryn Park  does not have any paternal aunts or uncles. Her paternal grandmother died in her 40s, and her grandfather died in his 18s. Neither had a history of cancer or other tumors.  Kathryn Park is unaware of previous family history of genetic testing for hereditary cancer risks. Her maternal  ancestors are of [place] descent, and paternal ancestors are of [place] and [place] descent. There is no reported Ashkenazi Jewish ancestry. There is no known consanguinity.  GENETIC COUNSELING ASSESSMENT: Ms. Garno is a 34 y.o. female with a personal history of granulosa cell tumor of the ovary and a family history of breast and kidney cancers diagnosed at older ages, which is not strongly suggestive of a hereditary cancer syndrome. We, therefore, discussed and recommended the following at today's visit.   DISCUSSION: We discussed that, in general, most cancers and tumors are not inherited in families, but instead is sporadic or familial. Sporadic cancers occur by chance and typically happen at older ages (>50 years) as this type of cancer is caused by genetic changes acquired during an individual's lifetime. Approximately 5-10% of cancer is hereditary, meaning that it is due to a mutation in a single gene that is passed down from generation to generation in a family. We discussed that granulosa cell tumors of the ovaries can be associated with a rare genetic condition called Peutz-Jeghers syndrome; however, there are typically other features present in individuals with this syndrome. Characteristic features of Peutz-Jeghers syndrome include hamartomatous gastrointestinal polyps and mucocutaneous pigmentation. The absence of these features in Kathryn Park greatly reduces the likelihood that she has Peutz-Jeghers syndrome.  We reviewed the characteristics, features and inheritance patterns of hereditary cancer syndromes. We discussed with Kathryn Park that, based on what we currently know about her family history of cancer  and tumors, the family history does not meet insurance or NCCN criteria for genetic testing. Therefore, her personal and family history is not highly consistent with a familial hereditary cancer syndrome.  We feel she is at low risk to harbor a gene mutation associated with such a condition. Thus, we did not recommend any genetic testing at this time, and recommended Kathryn Park continue to follow the cancer screening guidelines given by her primary healthcare provider.  Based on the patient's personal and family history, a statistical model Midwife) was used to estimate her risk of developing breast cancer. This estimates her lifetime risk of developing breast cancer to be approximately 19.0%. This estimation does not consider any genetic testing results.  The patient's lifetime breast cancer risk is a preliminary estimate based on available information using one of several models endorsed by the [place] (ACS). The ACS recommends consideration of breast MRI screening as an adjunct to mammography for patients at high risk (defined as 20% or greater lifetime risk).     PLAN:  Kathryn Park will try to obtain a copy of her father's pathology results to better determine if her family would benefit from genetic testing for hereditary cancer risks. We will then review these results and discuss whether genetic testing may be beneficial at that time. In the meantime, we recommend Kathryn Park continue to follow the cancer screening guidelines given by her primary healthcare provider.  Kathryn Park's questions were answered to her satisfaction today. Our contact information was provided should additional questions or concerns arise. Thank you for the referral and allowing [place] to share in the care of your patient.   Clint Guy, MS, [facility] Genetic Counselor [email or web address] Phone: [phone]  The patient was seen for a total of 30 minutes in face-to-face genetic  counseling.  This patient was discussed with Drs. Magrinat, Lindi Adie and/or Burr Medico who agrees with the above.    _______________________________________________________________________ For Office Staff:  Number of people involved in session: 1 Was an Intern/ student involved with  case: no

## 2020-01-15 ENCOUNTER — Telehealth: Payer: Self-pay

## 2020-01-15 NOTE — Telephone Encounter (Signed)
Left ovarian cyst and ovary removed 11/13/19 by Dr Quincy Simmonds due to Granulosa cell tumor dx by Dr Berline Lopes from pathology. AEX 10/11/19 with Dr Quincy Simmonds. Family hx of breast and kidney cancer.   Spoke with pt. Pt reports having intermittent cramping x 1 month, bladder retention with incomplete urine emptying and lower abd/hip pressure and urinary urgency for last 2-3 days and mainly on right side. Pt states " feels like it did when cyst was on left side"  Denies fever, chills, nausea, vomiting, vaginal bleeding or pain.   Pt has next follow up with Dr Berline Lopes for 3 month follow up on 02/20/2020.Advised pt to have OV for further evaluation. Pt agreeable. Pt scheduled to see Dr Talbert Nan on 01/16/2020 at 8 am in Dr Elza Rafter absence. Pt verbalized understanding of date and time of appt. Pt declines earlier work-in appt. Pt not in distress. ER precautions given. Pt agreeable. CPS neg.   Routing to Dr Talbert Nan for review, covering provider for Dr Quincy Simmonds.  Encounter closed.

## 2020-01-15 NOTE — Telephone Encounter (Signed)
Patient called regarding symptoms she is having. Patient stated it is the same symptoms she had before her surgery of cyst removal that contained tumor. Patient would like advice on who to schedule an appointment with to be seen [facility] or oncologist.

## 2020-01-16 ENCOUNTER — Ambulatory Visit: Payer: BC Managed Care – PPO | Admitting: Obstetrics and Gynecology

## 2020-01-16 ENCOUNTER — Other Ambulatory Visit: Payer: Self-pay

## 2020-01-16 ENCOUNTER — Encounter: Payer: Self-pay | Admitting: Obstetrics and Gynecology

## 2020-01-16 VITALS — BP 138/80 | HR 68 | Temp 97.9°F | Ht 66.0 in | Wt 132.0 lb

## 2020-01-16 DIAGNOSIS — R339 Retention of urine, unspecified: Secondary | ICD-10-CM

## 2020-01-16 DIAGNOSIS — R3915 Urgency of urination: Secondary | ICD-10-CM

## 2020-01-16 DIAGNOSIS — R102 Pelvic and perineal pain: Secondary | ICD-10-CM

## 2020-01-16 DIAGNOSIS — N949 Unspecified condition associated with female genital organs and menstrual cycle: Secondary | ICD-10-CM | POA: Diagnosis not present

## 2020-01-16 LAB — POCT URINALYSIS DIPSTICK
Bilirubin, UA: NEGATIVE
Blood, UA: NEGATIVE
Glucose, UA: NEGATIVE
Ketones, UA: NEGATIVE
Nitrite, UA: NEGATIVE
Protein, UA: NEGATIVE
Urobilinogen, UA: NEGATIVE E.U./dL — AB
pH, UA: 7 (ref 5.0–8.0)

## 2020-01-16 NOTE — Progress Notes (Signed)
GYNECOLOGY  VISIT   HPI: 34 y.o.   Married White or Caucasian Not Hispanic or Latino  female   [id number] with Patient's last menstrual period was 01/01/2020.   here for  having intermittent cramping x 1 month but is feeling better.  Bladder retention with incomplete urine emptying and lower abd/hip pressure mainly on right side. Urinary urgency for last 2-3 days and  Pt states " feels like it did when cyst was on left side"  Denies fever, chills, nausea, vomiting, vaginal bleeding or pain.   In 2/21 she had a laparoscopic left oophorectomy. 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 She has seen GYN oncology and had a negative pelvic/abdominal CT.   Back in January she was having issues with the sensation of bladder retention, it got better after her surgery in 2/21. It's never been completely normal since January, worse in the last few days. She is having urinary urgency and frequency. No dysuria. She has pressure in her bladder and in her right pelvis. She is voiding small to large amounts.   She had a day of intermittent cramping in the left pelvis a few days ago. Currently gone.  Normal BM qam.     GYNECOLOGIC HISTORY: Patient's last menstrual period was 01/01/2020. Contraception: condoms Menopausal hormone therapy: none         OB History    Gravida  0   Para  0   Term  0   Preterm  0   AB  0   Living  0     SAB  0   TAB  0   Ectopic  0   Multiple  0   Live Births  0              Patient Active Problem List   Diagnosis Date Noted  . Family history of breast cancer   . Family history of kidney cancer   . 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    Past Medical History:  Diagnosis Date  . Abnormal Pap smear of cervix   . Anxiety   . Asthma    childhood  . Depression   . Dysmenorrhea   . Family history of breast cancer   . Family history of kidney cancer   . History of iron deficiency anemia    age 63  . Joint pain   .  Leukocytosis   . Low vitamin D level   . Migraine with aura   . Ovarian cyst    Left  . Pneumonia 01/2012    left lower lobe  . Pre-diabetes   . STD (sexually transmitted disease)    HSV1  . Wears contact lenses   . Wears glasses     Past Surgical History:  Procedure Laterality Date  . COLPOSCOPY    . LAPAROSCOPIC OVARIAN CYSTECTOMY Left 11/13/2019   Procedure: LAPAROSCOPIC OVARIAN CYSTECTOMY/POSSIBLE LEFT OOPHORECTOMY/ COLLECTION OF PELVIC WASHINGS/ks;  Surgeon: Nunzio Cobbs, MD;  Location: [facility];  Service: Gynecology;  Laterality: Left;  Possible left oophorectomy Colection of pelvic washings To follow first case of the day  . TONSILLECTOMY    . UPPER GI ENDOSCOPY      Current Outpatient Medications  Medication Sig Dispense Refill  . Cholecalciferol (VITAMIN D-3) 25 MCG (1000 UT) CAPS Take by mouth. 50 mcg    . ibuprofen (ADVIL) 800 MG tablet Take 1 tablet (800 mg total) by mouth every 8 (eight) hours as needed  for mild pain. 30 tablet 1  . valACYclovir (VALTREX) 1000 MG tablet Take 2 tablets (2000 mg) by mouth twice a day for 24 hours. (Patient taking differently: as needed. Take 2 tablets (2000 mg) by mouth twice a day for 24 hours.) 30 tablet 1   No current facility-administered medications for this visit.     ALLERGIES: Shellfish allergy, Latex, and Sulfa antibiotics  Family History  Problem Relation Age of Onset  . Diabetes Mother   . Hypertension Mother   . Stroke Mother        TIA  . Factor V Leiden deficiency Mother   . Other Mother        pituitary tumor  . Crohn's disease Father   . Kidney cancer Father        cancerous tumor of kidney  . Breast cancer Maternal Grandmother        dx. 3s  . Stroke Maternal Grandfather   . Other Brother        ear tumor, dx. age 17-6  . Ovarian cancer Neg Hx   . Endometrial cancer Neg Hx   . Colon cancer Neg Hx     Social History   Socioeconomic History  . Marital status: Married     Spouse name: Not on file  . Number of children: Not on file  . Years of education: Not on file  . Highest education level: Not on file  Occupational History  . Not on file  Tobacco Use  . Smoking status: Former Smoker    Packs/day: 1.00    Types: Cigarettes  . Smokeless tobacco: Never Used  Substance and Sexual Activity  . Alcohol use: Not Currently  . Drug use: No  . Sexual activity: Yes    Partners: Male    Birth control/protection: Condom, Pill    Comment: everytime  Other Topics Concern  . Not on file  Social History Narrative  . Not on file   Social Determinants of Health   Financial Resource Strain:   . Difficulty of Paying Living Expenses:   Food Insecurity:   . Worried About Charity fundraiser in the Last Year:   . Arboriculturist in the Last Year:   Transportation Needs:   . Film/video editor (Medical):   Marland Kitchen Lack of Transportation (Non-Medical):   Physical Activity:   . Days of Exercise per Week:   . Minutes of Exercise per Session:   Stress:   . Feeling of Stress :   Social Connections:   . Frequency of Communication with Friends and Family:   . Frequency of Social Gatherings with Friends and Family:   . Attends Religious Services:   . Active Member of Clubs or Organizations:   . Attends Archivist Meetings:   Marland Kitchen Marital Status:   Intimate Partner Violence:   . Fear of Current or Ex-Partner:   . Emotionally Abused:   Marland Kitchen Physically Abused:   . Sexually Abused:     Review of Systems  Genitourinary: Positive for frequency and urgency.    PHYSICAL EXAMINATION:    BP 138/80   Pulse 68   Temp 97.9 F (36.6 C)   Ht 5\' 6"  (1.676 m)   Wt 132 lb (59.9 kg)   LMP 01/01/2020   SpO2 100%   BMI 21.31 kg/m     General appearance: alert, cooperative and appears stated age Abdomen: soft, mildly tender in the region of the ascending colon, stool palpated in her  ascending colon. Mild suprapubic tenderness.  Non distended, no masses,  no  organomegaly  Pelvic: External genitalia:  no lesions              Urethra:  normal appearing urethra with no masses, tenderness or lesions              Bartholins and Skenes: normal                 Cervix: no cervical motion tenderness              Bimanual Exam:  Uterus:  normal size, contour, position, consistency, mobility, non-tender and anteverted              Adnexa: fullness and tenderness on the right, suspect stool               Chaperone was present for exam.  Urine dip: trace leuk.  St cath ua performed in the usual sterile fashion. PVR 60. Dip again with trace leuk.  ASSESSMENT Urinary frequency/urgency. No dysuria Pelvic pressure/discomort. Most prominent in the region of the ascending colon Adnexal fullness on the right, suspect stool Sensation of urinary retention, PVR slightly over normal at 60 cc    PLAN Send urine for ua, c&s Can try over the counter AZO for 1-2 days. Call with worsening symptoms Return early next week for a repeat pelvic exam, if still with adnexal fullness will set up an ultrasound She can try tylenol or ibuprofen as needed.  If bladder symptoms persist will refer to Urology

## 2020-01-17 LAB — URINALYSIS, MICROSCOPIC ONLY
Bacteria, UA: NONE SEEN
Casts: NONE SEEN /lpf
RBC, Urine: NONE SEEN /hpf (ref 0–2)
WBC, UA: NONE SEEN /hpf (ref 0–5)

## 2020-01-18 LAB — URINE CULTURE: Organism ID, Bacteria: NO GROWTH

## 2020-01-21 ENCOUNTER — Other Ambulatory Visit: Payer: Self-pay

## 2020-01-22 ENCOUNTER — Encounter: Payer: Self-pay | Admitting: Obstetrics and Gynecology

## 2020-01-22 ENCOUNTER — Ambulatory Visit: Payer: BC Managed Care – PPO | Admitting: Obstetrics and Gynecology

## 2020-01-22 VITALS — BP 110/64 | HR 74 | Temp 98.2°F | Ht 66.0 in | Wt 133.2 lb

## 2020-01-22 DIAGNOSIS — R339 Retention of urine, unspecified: Secondary | ICD-10-CM | POA: Diagnosis not present

## 2020-01-22 DIAGNOSIS — R3915 Urgency of urination: Secondary | ICD-10-CM

## 2020-01-22 DIAGNOSIS — Z0000 Encounter for general adult medical examination without abnormal findings: Secondary | ICD-10-CM

## 2020-01-22 NOTE — Progress Notes (Signed)
GYNECOLOGY  VISIT   HPI: 34 y.o.   Married White or Caucasian Not Hispanic or Latino  female   G0P0000 with Patient's last menstrual period was 01/01/2020.   here for follow up exam.  She was seen last week with urinary c/o and right pelvic discomfort. She had a negative urine culture, a PVR of 60 and fullness was noted in the right adnexa (suspected stool). Patient states that she continues to have urinary symptoms, but her right pelvic discomfort has improved.  She c/o a mild constant bladder pressure and sensation that she has to void and isn't emptying (symptoms for over 4 months). She has avoided caffeine for a few days, no help. Tried Azo and didn't help.   In 2/21 she had a laparoscopic left oophorectomy. 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 She has seen GYN oncology and had a negative pelvic/abdominal CT.   GYNECOLOGIC HISTORY: Patient's last menstrual period was 01/01/2020. Contraception: condoms  Menopausal hormone therapy: none         OB History    Gravida  0   Para  0   Term  0   Preterm  0   AB  0   Living  0     SAB  0   TAB  0   Ectopic  0   Multiple  0   Live Births  0              Patient Active Problem List   Diagnosis Date Noted  . Family history of breast cancer   . Family history of kidney cancer   . 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    Past Medical History:  Diagnosis Date  . Abnormal Pap smear of cervix   . Anxiety   . Asthma    childhood  . Depression   . Dysmenorrhea   . Family history of breast cancer   . Family history of kidney cancer   . History of iron deficiency anemia    age 43  . Joint pain   . Leukocytosis   . Low vitamin D level   . Migraine with aura   . Ovarian cyst    Left  . Pneumonia 01/2012    left lower lobe  . Pre-diabetes   . STD (sexually transmitted disease)    HSV1  . Wears contact lenses   . Wears glasses     Past Surgical History:  Procedure  Laterality Date  . COLPOSCOPY    . LAPAROSCOPIC OVARIAN CYSTECTOMY Left 11/13/2019   Procedure: LAPAROSCOPIC OVARIAN CYSTECTOMY/POSSIBLE LEFT OOPHORECTOMY/ COLLECTION OF PELVIC WASHINGS/ks;  Surgeon: Nunzio Cobbs, MD;  Location: [facility];  Service: Gynecology;  Laterality: Left;  Possible left oophorectomy Colection of pelvic washings To follow first case of the day  . TONSILLECTOMY    . UPPER GI ENDOSCOPY      Current Outpatient Medications  Medication Sig Dispense Refill  . Cholecalciferol (VITAMIN D-3) 25 MCG (1000 UT) CAPS Take by mouth. 50 mcg    . Fexofenadine-Pseudoephedrine (ALLEGRA-D 24 HOUR PO)     . ibuprofen (ADVIL) 800 MG tablet Take 1 tablet (800 mg total) by mouth every 8 (eight) hours as needed for mild pain. 30 tablet 1  . valACYclovir (VALTREX) 1000 MG tablet Take 2 tablets (2000 mg) by mouth twice a day for 24 hours. (Patient taking differently: as needed. Take 2 tablets (2000 mg) by mouth twice a day for 24 hours.)  30 tablet 1   No current facility-administered medications for this visit.     ALLERGIES: Shellfish allergy, Latex, and Sulfa antibiotics  Family History  Problem Relation Age of Onset  . Diabetes Mother   . Hypertension Mother   . Stroke Mother        TIA  . Factor V Leiden deficiency Mother   . Other Mother        pituitary tumor  . Crohn's disease Father   . Kidney cancer Father        cancerous tumor of kidney  . Breast cancer Maternal Grandmother        dx. 3s  . Stroke Maternal Grandfather   . Other Brother        ear tumor, dx. age 25-6  . Ovarian cancer Neg Hx   . Endometrial cancer Neg Hx   . Colon cancer Neg Hx     Social History   Socioeconomic History  . Marital status: Married    Spouse name: Not on file  . Number of children: Not on file  . Years of education: Not on file  . Highest education level: Not on file  Occupational History  . Not on file  Tobacco Use  . Smoking status: Former  Smoker    Packs/day: 1.00    Types: Cigarettes  . Smokeless tobacco: Never Used  Substance and Sexual Activity  . Alcohol use: Not Currently  . Drug use: No  . Sexual activity: Yes    Partners: Male    Birth control/protection: Condom, Pill    Comment: everytime  Other Topics Concern  . Not on file  Social History Narrative  . Not on file   Social Determinants of Health   Financial Resource Strain:   . Difficulty of Paying Living Expenses:   Food Insecurity:   . Worried About Charity fundraiser in the Last Year:   . Arboriculturist in the Last Year:   Transportation Needs:   . Film/video editor (Medical):   Marland Kitchen Lack of Transportation (Non-Medical):   Physical Activity:   . Days of Exercise per Week:   . Minutes of Exercise per Session:   Stress:   . Feeling of Stress :   Social Connections:   . Frequency of Communication with Friends and Family:   . Frequency of Social Gatherings with Friends and Family:   . Attends Religious Services:   . Active Member of Clubs or Organizations:   . Attends Archivist Meetings:   Marland Kitchen Marital Status:   Intimate Partner Violence:   . Fear of Current or Ex-Partner:   . Emotionally Abused:   Marland Kitchen Physically Abused:   . Sexually Abused:     Review of Systems  Constitutional: Negative.   HENT: Negative.   Eyes: Negative.   Respiratory: Negative.   Cardiovascular: Negative.   Gastrointestinal: Negative.   Genitourinary: Positive for urgency.  Musculoskeletal: Negative.   Skin: Negative.   Neurological: Negative.   Endo/Heme/Allergies: Positive for environmental allergies.  Psychiatric/Behavioral: Negative.     PHYSICAL EXAMINATION:    BP 110/64   Pulse 74   Temp 98.2 F (36.8 C)   Ht 5\' 6"  (1.676 m)   Wt 133 lb 3.2 oz (60.4 kg)   LMP 01/01/2020   SpO2 100%   BMI 21.50 kg/m     General appearance: alert, cooperative and appears stated age   Pelvic: External genitalia:  no lesions  Urethra:   normal appearing urethra with no masses, tenderness or lesions              Bartholins and Skenes: normal                 Cervix: no cervical motion tenderness              Bimanual Exam:  Uterus:  normal size, contour, position, consistency, mobility, non-tender              Adnexa: no mass, fullness, tenderness              Rectovaginal: Yes.  .  Confirms.              Anus:  normal sphincter tone, no lesions  Chaperone was present for exam.  ASSESSMENT Adnexal fullness has resolved Continued urinary c/o. PVR from last week was 60 cc, urine culture was negative    PLAN Recommended referral to Urology She is living in [place] and will try and find a Urologist there. She will contact our office if she needs [place] to place a referral for her.

## 2020-01-23 ENCOUNTER — Telehealth: Payer: Self-pay

## 2020-01-23 DIAGNOSIS — R3915 Urgency of urination: Secondary | ICD-10-CM

## 2020-01-23 DIAGNOSIS — N949 Unspecified condition associated with female genital organs and menstrual cycle: Secondary | ICD-10-CM

## 2020-01-23 DIAGNOSIS — R339 Retention of urine, unspecified: Secondary | ICD-10-CM

## 2020-01-23 DIAGNOSIS — R102 Pelvic and perineal pain: Secondary | ICD-10-CM

## 2020-01-23 NOTE — Telephone Encounter (Signed)
Spoke with pt. Pt calling to give update from OV to Dr Talbert Nan on referral to urologist.  ** Recommended referral to Urology She is living in [place] and will try and find a Urologist there. She will contact our office if she needs [place] to place a referral for her.   Pt states would like to come to [place] to be seen, no female providers in [place] where she lives.   Referral placed to Dr Claudia Desanctis at [facility].   Routing to Dr Talbert Nan for review. Encounter closed.  Cc: Rosa for referral.

## 2020-01-23 NOTE — Telephone Encounter (Signed)
Patient is calling in regards to wanting a referral to a Urologist per Dr. Talbert Nan suggestion on (01/22/20). Patient prefers a female Dealer and is okay with traveling to [place].

## 2020-02-19 ENCOUNTER — Ambulatory Visit: Payer: BC Managed Care – PPO | Admitting: Gynecologic Oncology

## 2020-02-19 NOTE — Progress Notes (Signed)
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  No free fluid.  Right ovary normal in appearance.   11/13/2019 Surgery   Laparoscopic left oophorectomy with collection of pelvic washings, lysis of adhesion.  Findings at the time of surgery were a 12 cm left ovarian cyst.  No ascites or intra-abdominal/pelvic disease.  Ovary was removed in a contained fashion and in an Endo Catch bag.   11/13/2019 Pathology Results   Left ovary showing granulosa cell tumor, 2.1 cm.  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 Pelvic washings negative for malignant cells.   11/13/2019 Initial Diagnosis   Granulosa cell tumor of left ovary   11/29/2019 Imaging   CT A/P: No acute findings in the abdomen or pelvis.  Specifically, no evidence for metastatic disease in the abdomen or pelvis. Trace free fluid in the cul-de-sac.  This can be physiologic in a premenopausal female.   12/27/2019 Tumor Marker   Patient's blood was tested for the following markers: Inh B, AMH Results of the tumor marker test revealed: 83, 1.07.     Interval History: Overall doing well. Has been seen several times by her GYN's office for urinary symptoms - is scheduled to see Dr. Claudia Desanctis (Urology) in mid-June. She describes that early fall of 2020 having bladder symptoms including retention and abdominal pressure. These symptoms fluctuate based on the day and week to week. After surgery, she thinks her symptoms improved some but did not resolve. She is unsure whether this is because she was distracted by pain and different symptoms that she experienced in the post-operative period. She has now started tracking her fluid intake, times she voids and  the day of her menstrual cycle. She avoided caffeine for 2 weeks and did not notice any difference. She has not noticed a relation to intake of certain foods. Her work-up has included urine studies to r/o a UTI and an I&O catheterization to check for PVR. She took Azo for a couple of days without change in her symptoms.  Since surgery, she denies pelvic pain. Her cycles have continued to be normal, but are less painful. She denies any intermenstrual bleeding. She has a good appetite without nausea. Her bowel function has been normal until the last few weeks, but she thinks this is related to change in diet.  Past Medical/Surgical History: Past Medical History:  Diagnosis Date  . Abnormal Pap smear of cervix   . Anxiety   . Asthma    childhood  . Depression   . Dysmenorrhea   . Family history of breast cancer   . Family history of kidney cancer   . History of iron deficiency anemia    age 34  . Joint pain   . Leukocytosis   . Low vitamin D level   . Migraine with aura   . Ovarian cyst    Left  . Pneumonia 01/2012    left lower lobe  . Pre-diabetes   . STD (sexually transmitted disease)    HSV1  . Wears contact lenses   . Wears glasses     Past Surgical History:  Procedure Laterality Date  . COLPOSCOPY    . LAPAROSCOPIC OVARIAN CYSTECTOMY Left 11/13/2019  Procedure: LAPAROSCOPIC OVARIAN CYSTECTOMY/POSSIBLE LEFT OOPHORECTOMY/ COLLECTION OF PELVIC WASHINGS/ks;  Surgeon: Nunzio Cobbs, MD;  Location: [facility];  Service: Gynecology;  Laterality: Left;  Possible left oophorectomy Colection of pelvic washings To follow first case of the day  . TONSILLECTOMY    . UPPER GI ENDOSCOPY      Family History  Problem Relation Age of Onset  . Diabetes Mother   . Hypertension Mother   . Stroke Mother        TIA  . Factor V Leiden deficiency Mother   . Other Mother        pituitary tumor  . Crohn's disease Father   . Kidney cancer Father         cancerous tumor of kidney  . Breast cancer Maternal Grandmother        dx. 2s  . Stroke Maternal Grandfather   . Other Brother        ear tumor, dx. age 28-6  . Ovarian cancer Neg Hx   . Endometrial cancer Neg Hx   . Colon cancer Neg Hx     Social History   Socioeconomic History  . Marital status: Married    Spouse name: Not on file  . Number of children: Not on file  . Years of education: Not on file  . Highest education level: Not on file  Occupational History  . Not on file  Tobacco Use  . Smoking status: Former Smoker    Packs/day: 1.00    Types: Cigarettes  . Smokeless tobacco: Never Used  Substance and Sexual Activity  . Alcohol use: Not Currently  . Drug use: No  . Sexual activity: Yes    Partners: Male    Birth control/protection: Condom, Pill    Comment: everytime  Other Topics Concern  . Not on file  Social History Narrative  . Not on file   Social Determinants of Health   Financial Resource Strain:   . Difficulty of Paying Living Expenses:   Food Insecurity:   . Worried About Charity fundraiser in the Last Year:   . Arboriculturist in the Last Year:   Transportation Needs:   . Film/video editor (Medical):   Marland Kitchen Lack of Transportation (Non-Medical):   Physical Activity:   . Days of Exercise per Week:   . Minutes of Exercise per Session:   Stress:   . Feeling of Stress :   Social Connections:   . Frequency of Communication with Friends and Family:   . Frequency of Social Gatherings with Friends and Family:   . Attends Religious Services:   . Active Member of Clubs or Organizations:   . Attends Archivist Meetings:   Marland Kitchen Marital Status:     Current Medications:  Current Outpatient Medications:  .  Cholecalciferol (VITAMIN D-3) 25 MCG (1000 UT) CAPS, Take by mouth. 50 mcg, Disp: , Rfl:  .  Fexofenadine-Pseudoephedrine (ALLEGRA-D 24 HOUR PO), , Disp: , Rfl:  .  ibuprofen (ADVIL) 800 MG tablet, Take 1 tablet (800 mg total) by mouth  every 8 (eight) hours as needed for mild pain., Disp: 30 tablet, Rfl: 1 .  valACYclovir (VALTREX) 1000 MG tablet, Take 2 tablets (2000 mg) by mouth twice a day for 24 hours. (Patient taking differently: as needed. Take 2 tablets (2000 mg) by mouth twice a day for 24 hours.), Disp: 30 tablet, Rfl: 1  Review of Systems: +urinary symptoms including frequency, recently swollen  lymph nodes associated with ingrown vulvar hair Denies appetite changes, fevers, chills, fatigue, unexplained weight changes. Denies hearing loss, neck lumps or masses, mouth sores, ringing in ears or voice changes. Denies cough or wheezing.  Denies shortness of breath. Denies chest pain or palpitations. Denies leg swelling. Denies abdominal distention, pain, blood in stools, constipation, diarrhea, nausea, vomiting, or early satiety. Denies pain with intercourse, dysuria, hematuria or incontinence. Denies hot flashes, pelvic pain, vaginal bleeding or vaginal discharge.   Denies joint pain, back pain or muscle pain/cramps. Denies itching, rash, or wounds. Denies dizziness, headaches, numbness or seizures. Denies easy bruising or bleeding. Denies anxiety, depression, confusion, or decreased concentration.  Physical Exam: BP 133/84 (BP Location: Left Arm, Patient Position: Sitting)   Pulse 90   Temp 98.7 F (37.1 C) (Oral)   Resp 16   Ht 5\' 6"  (1.676 m)   Wt 130 lb (59 kg)   SpO2 100%   BMI 20.98 kg/m  General: Alert, oriented, no acute distress. HEENT: Normocephalic, sclera anicteric. Chest: Unlabored breathing on room air.  Abdomen: soft, nontender.  Normoactive bowel sounds.  No masses or hepatosplenomegaly appreciated.  Well-healed lsc incisions. Extremities: Grossly normal range of motion.  Warm, well perfused.  No edema bilaterally. Skin: No rashes or lesions noted. Lymphatics: No cervical, supraclavicular, or inguinal adenopathy. GU: Normal appearing external genitalia without erythema, excoriation, or  lesions.  Speculum exam reveals normal cervix without lesions.  Bimanual exam reveals small mobile uterus, no adnexal masses appreciated.    Laboratory & Radiologic Studies: None new  Assessment & Plan: Kathryn Park is a 34 y.o. 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  Discussed her urinary symptoms and although it seems that they are perhaps unrelated to her prior mass, the timing temporal association raises the possibility that they may be. I don't feel an adnexal mass on exam, but I recommend getting a pelvic ultrasound to further evaluate. Her CT in February was negative for any pathology.  If imaging negative, she will see Urology in June. We discussed details of the diagnosis again and I answered questions that she had about recurrence risk, time for recurrence, and treatment strategies if she were to have a recurrence. We also discussed again the possibility of completion surgery once she is closer to natural menopause.  Per SGO surveillance recommendations, we will plan on q 6 month follow-up visits and labs (Inh B and AMH - unknown if either is a marker for her given incidental diagnosis after surgery). We would image if symptoms or exam prompted further evaluation.  25 minutes of total time was spent for this patient encounter, including preparation, face-to-face counseling with the patient and coordination of care, and documentation of the encounter.  Jeral Pinch, MD  Division of Gynecologic Oncology  Department of Obstetrics and Gynecology  [facility]

## 2020-02-20 ENCOUNTER — Encounter: Payer: Self-pay | Admitting: Gynecologic Oncology

## 2020-02-20 ENCOUNTER — Other Ambulatory Visit: Payer: Self-pay

## 2020-02-20 ENCOUNTER — Other Ambulatory Visit: Payer: BC Managed Care – PPO

## 2020-02-20 ENCOUNTER — Inpatient Hospital Stay: Payer: BC Managed Care – PPO | Attending: Gynecologic Oncology | Admitting: Gynecologic Oncology

## 2020-02-20 ENCOUNTER — Inpatient Hospital Stay: Payer: BC Managed Care – PPO

## 2020-02-20 VITALS — BP 133/84 | HR 90 | Temp 98.7°F | Resp 16 | Ht 66.0 in | Wt 130.0 lb

## 2020-02-20 DIAGNOSIS — Z833 Family history of diabetes mellitus: Secondary | ICD-10-CM | POA: Insufficient documentation

## 2020-02-20 DIAGNOSIS — Z803 Family history of malignant neoplasm of breast: Secondary | ICD-10-CM | POA: Diagnosis not present

## 2020-02-20 DIAGNOSIS — Z79899 Other long term (current) drug therapy: Secondary | ICD-10-CM | POA: Insufficient documentation

## 2020-02-20 DIAGNOSIS — D3912 Neoplasm of uncertain behavior of left ovary: Secondary | ICD-10-CM

## 2020-02-20 DIAGNOSIS — Z8051 Family history of malignant neoplasm of kidney: Secondary | ICD-10-CM | POA: Diagnosis not present

## 2020-02-20 DIAGNOSIS — Z87891 Personal history of nicotine dependence: Secondary | ICD-10-CM | POA: Insufficient documentation

## 2020-02-20 DIAGNOSIS — Z90721 Acquired absence of ovaries, unilateral: Secondary | ICD-10-CM | POA: Diagnosis not present

## 2020-02-20 DIAGNOSIS — J45909 Unspecified asthma, uncomplicated: Secondary | ICD-10-CM | POA: Insufficient documentation

## 2020-02-20 DIAGNOSIS — Z8249 Family history of ischemic heart disease and other diseases of the circulatory system: Secondary | ICD-10-CM | POA: Diagnosis not present

## 2020-02-20 NOTE — Patient Instructions (Signed)
Things are very reassuring on your exam today.  I will release the lab tests once they are back either later today or tomorrow.  I will also contact you with the results of the ultrasound after that has been done.  If all of the above are normal, then I will see you in 6 months for follow-up.  If you develop any new symptoms in the meantime, please do not hesitate to call me at [phone].

## 2020-02-22 ENCOUNTER — Telehealth: Payer: Self-pay | Admitting: *Deleted

## 2020-02-22 NOTE — Telephone Encounter (Signed)
Patient called to confirm her appt for Monday and make sure she is to enter through the main hospital. Patient also wanted to make sure it was ok to still have the [place] since she started her menses. Explained that is was ok to still have her [place] scan and to enter through the main hospital.

## 2020-02-25 ENCOUNTER — Ambulatory Visit (HOSPITAL_COMMUNITY)
Admission: RE | Admit: 2020-02-25 | Discharge: 2020-02-25 | Disposition: A | Discharge status: Home / Self Care | Payer: BC Managed Care – PPO | Source: Ambulatory Visit | Attending: Gynecologic Oncology | Admitting: Gynecologic Oncology

## 2020-02-25 ENCOUNTER — Other Ambulatory Visit: Payer: Self-pay

## 2020-02-25 DIAGNOSIS — D3912 Neoplasm of uncertain behavior of left ovary: Secondary | ICD-10-CM | POA: Diagnosis present

## 2020-02-25 LAB — ANTI MULLERIAN HORMONE: ANTI-MULLERIAN HORMONE (AMH): 2.08 ng/mL

## 2020-02-26 LAB — INHIBIN B: Inhibin B: 14.9 pg/mL

## 2020-03-20 ENCOUNTER — Telehealth: Payer: Self-pay

## 2020-03-20 NOTE — Telephone Encounter (Signed)
Patient returned call and had concerns about brown spotting 8 days before her cycle.  She reports that it lasted until her cycle started and cycle began 2 days earlier than usual.  Per MD, no reason for concern at this time.  Monitor cycles and if new issues arise call the office or send message through my chart.  Patient voiced understanding of above.

## 2020-03-20 NOTE — Telephone Encounter (Signed)
Attempted to reach patient returning her call.  Left voice mail on personal mail box to call us back.

## 2020-04-03 HISTORY — PX: BREAST BIOPSY: SHX20

## 2020-04-14 ENCOUNTER — Telehealth: Payer: Self-pay | Admitting: Obstetrics and Gynecology

## 2020-04-14 NOTE — Progress Notes (Signed)
GYNECOLOGY  VISIT   HPI: 34 y.o.   Married  Caucasian  female   G0P0000 with Patient's last menstrual period was 04/15/2020 (exact date).   here for Rt.breast lump x2 weeks. It feels like the exact same thing she had 2 years ago and had imaging.    She is having a little discomfort and pain in the same area.  She has a history of benign intra mammary lymph node 7 mm at 10:00 position.  She also has a cystic area in the lower, midline breast.   She will see Dr. Berline Lopes in November, 2021 for follow up of her granulosa cell tumor of the ovary.   Moving back to [place].   She had her Covid vaccine in March and April.  Moderna.  Left arm.   GYNECOLOGIC HISTORY: Patient's last menstrual period was 04/15/2020 (exact date). Contraception:  condoms Menopausal hormone therapy:  none Last mammogram: 10-11-19 Diag.Bil./Stable probably benign right breast asymmetry/density D/Bil.Diag.in 1-2 years to ensure stability/BiRads3 Last pap smear: 03-28-18 Neg:Neg HR HPV, 2015 ?Neg        OB History    Gravida  0   Para  0   Term  0   Preterm  0   AB  0   Living  0     SAB  0   TAB  0   Ectopic  0   Multiple  0   Live Births  0              Patient Active Problem List   Diagnosis Date Noted  . Family history of breast cancer   . Family history of kidney cancer   . 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    Past Medical History:  Diagnosis Date  . Abnormal Pap smear of cervix   . Anxiety   . Asthma    childhood  . Depression   . Dysmenorrhea   . Family history of breast cancer   . Family history of kidney cancer   . History of iron deficiency anemia    age 80  . Joint pain   . Leukocytosis   . Low vitamin D level   . Migraine with aura   . Ovarian cyst    Left  . Pneumonia 01/2012    left lower lobe  . Pre-diabetes   . STD (sexually transmitted disease)    HSV1  . Wears contact lenses   . Wears glasses     Past Surgical History:  Procedure Laterality  Date  . COLPOSCOPY    . LAPAROSCOPIC OVARIAN CYSTECTOMY Left 11/13/2019   Procedure: LAPAROSCOPIC OVARIAN CYSTECTOMY/POSSIBLE LEFT OOPHORECTOMY/ COLLECTION OF PELVIC WASHINGS/ks;  Surgeon: Nunzio Cobbs, MD;  Location: [facility];  Service: Gynecology;  Laterality: Left;  Possible left oophorectomy Colection of pelvic washings To follow first case of the day  . TONSILLECTOMY    . UPPER GI ENDOSCOPY      Current Outpatient Medications  Medication Sig Dispense Refill  . Cholecalciferol (VITAMIN D-3) 25 MCG (1000 UT) CAPS Take by mouth. 50 mcg    . Fexofenadine-Pseudoephedrine (ALLEGRA-D 24 HOUR PO)     . ibuprofen (ADVIL) 800 MG tablet Take 1 tablet (800 mg total) by mouth every 8 (eight) hours as needed for mild pain. 30 tablet 1  . valACYclovir (VALTREX) 1000 MG tablet Take 2 tablets (2000 mg) by mouth twice a day for 24 hours. (Patient taking differently: as needed. Take 2 tablets (2000  mg) by mouth twice a day for 24 hours.) 30 tablet 1   No current facility-administered medications for this visit.     ALLERGIES: Shellfish allergy, Latex, and Sulfa antibiotics  Family History  Problem Relation Age of Onset  . Diabetes Mother   . Hypertension Mother   . Stroke Mother        TIA  . Factor V Leiden deficiency Mother   . Other Mother        pituitary tumor  . Crohn's disease Father   . Kidney cancer Father        cancerous tumor of kidney  . Breast cancer Maternal Grandmother        dx. 3s  . Stroke Maternal Grandfather   . Other Brother        ear tumor, dx. age 11-6  . Ovarian cancer Neg Hx   . Endometrial cancer Neg Hx   . Colon cancer Neg Hx     Social History   Socioeconomic History  . Marital status: Married    Spouse name: Not on file  . Number of children: Not on file  . Years of education: Not on file  . Highest education level: Not on file  Occupational History  . Not on file  Tobacco Use  . Smoking status: Former Smoker     Packs/day: 1.00    Types: Cigarettes  . Smokeless tobacco: Never Used  Vaping Use  . Vaping Use: Never used  Substance and Sexual Activity  . Alcohol use: Not Currently  . Drug use: No  . Sexual activity: Yes    Partners: Male    Birth control/protection: Condom, Pill    Comment: everytime  Other Topics Concern  . Not on file  Social History Narrative  . Not on file   Social Determinants of Health   Financial Resource Strain:   . Difficulty of Paying Living Expenses:   Food Insecurity:   . Worried About Charity fundraiser in the Last Year:   . Arboriculturist in the Last Year:   Transportation Needs:   . Film/video editor (Medical):   Marland Kitchen Lack of Transportation (Non-Medical):   Physical Activity:   . Days of Exercise per Week:   . Minutes of Exercise per Session:   Stress:   . Feeling of Stress :   Social Connections:   . Frequency of Communication with Friends and Family:   . Frequency of Social Gatherings with Friends and Family:   . Attends Religious Services:   . Active Member of Clubs or Organizations:   . Attends Archivist Meetings:   Marland Kitchen Marital Status:   Intimate Partner Violence:   . Fear of Current or Ex-Partner:   . Emotionally Abused:   Marland Kitchen Physically Abused:   . Sexually Abused:     Review of Systems  All other systems reviewed and are negative.   PHYSICAL EXAMINATION:    BP 102/66   Pulse 70   Ht 5\' 7"  (1.702 m)   Wt 123 lb 3.2 oz (55.9 kg)   LMP 04/15/2020 (Exact Date)   BMI 19.30 kg/m     General appearance: alert, cooperative and appears stated age   Breasts:  Left - normal appearance, no masses or tenderness, No nipple retraction or dimpling, No nipple discharge or bleeding, No axillary or supraclavicular adenopathy Right - normal appearance, 5 mm mass near axilla and 1 cm mass at 6:00 below the areola.   No  nipple retraction or dimpling, No nipple discharge or bleeding, No axillary or supraclavicular adenopathy  Chaperone  was present for exam.  ASSESSMENT  Right breast masses.  Hx right axillary intramammary node. FH breast cancer.  Maternal grandmother.  Granulosa cell tumor of the the left ovary.   PLAN  Will proceed with right dx mammogram and right breast and axillary [place].  FU with GYN ONC in November, 2021.   36 minutes consultation.

## 2020-04-14 NOTE — Telephone Encounter (Signed)
Spoke with patient. Patient reports lump on the side of her right breast, noticed it 1.5 -2wks ago. Non tender. Denies skin changes, nipple d/c, redness, swelling. No recent vaccines. Menses is due to start any day now. Patient will be traveling from [place].  Covid 19 prescreen completed, precautions reviewed.   OV scheduled for 7/13 at 11:30am with Dr. Quincy Simmonds. Advised if breast imaging is recommended, our office will assist with scheduling. Advised can not guarantee imaging can be completed on the same day. Patient verbalizes understanding and is agreeable.  Last AEX 10/11/19 Last MMG  -bilateral Dx MMG 10/11/19  Routing to provider for final review. Patient is agreeable to disposition. Will close encounter.

## 2020-04-14 NOTE — Telephone Encounter (Signed)
Patient has a right breast lump.

## 2020-04-15 ENCOUNTER — Encounter: Payer: Self-pay | Admitting: Obstetrics and Gynecology

## 2020-04-15 ENCOUNTER — Ambulatory Visit: Payer: BC Managed Care – PPO | Admitting: Obstetrics and Gynecology

## 2020-04-15 ENCOUNTER — Other Ambulatory Visit: Payer: Self-pay

## 2020-04-15 ENCOUNTER — Telehealth: Payer: Self-pay | Admitting: Obstetrics and Gynecology

## 2020-04-15 VITALS — BP 102/66 | HR 70 | Ht 67.0 in | Wt 123.2 lb

## 2020-04-15 DIAGNOSIS — D3912 Neoplasm of uncertain behavior of left ovary: Secondary | ICD-10-CM

## 2020-04-15 DIAGNOSIS — N6310 Unspecified lump in the right breast, unspecified quadrant: Secondary | ICD-10-CM

## 2020-04-15 DIAGNOSIS — Z803 Family history of malignant neoplasm of breast: Secondary | ICD-10-CM

## 2020-04-15 DIAGNOSIS — N63 Unspecified lump in unspecified breast: Secondary | ICD-10-CM

## 2020-04-15 NOTE — Telephone Encounter (Signed)
Spoke with Langley Gauss at [facility] Patient scheduled for right breast Dx MMG and [place], if needed, 04/16/20 at 2:50pm, arrive at 2:30pm. Patient is agreeable to date and time.  Langley Gauss will review with supervisor to determine if earlier appt is available, TBC will contact the patient directly if earlier appt is available, confirmed telephone number on file. Patient is agreeable to plan.   Patient placed in MMG hold.   Routing to provider for final review. Patient is agreeable to disposition. Will close encounter.

## 2020-04-15 NOTE — Telephone Encounter (Signed)
Please schedule a dx right mammogram and right breast and axillary Korea.   She has a 5 mm mass near the axilla and a 1 cm mass at 6:00 below the areola.   She has been treated for an ovarian granulosa tumor.

## 2020-04-16 ENCOUNTER — Ambulatory Visit
Admission: RE | Admit: 2020-04-16 | Discharge: 2020-04-16 | Disposition: A | Discharge status: Home / Self Care | Payer: BC Managed Care – PPO | Source: Ambulatory Visit | Attending: Obstetrics and Gynecology | Admitting: Obstetrics and Gynecology

## 2020-04-16 ENCOUNTER — Other Ambulatory Visit: Payer: BC Managed Care – PPO

## 2020-04-16 ENCOUNTER — Other Ambulatory Visit: Payer: Self-pay | Admitting: Obstetrics and Gynecology

## 2020-04-16 DIAGNOSIS — Z803 Family history of malignant neoplasm of breast: Secondary | ICD-10-CM

## 2020-04-16 DIAGNOSIS — N63 Unspecified lump in unspecified breast: Secondary | ICD-10-CM

## 2020-04-16 DIAGNOSIS — D3912 Neoplasm of uncertain behavior of left ovary: Secondary | ICD-10-CM

## 2020-04-17 ENCOUNTER — Ambulatory Visit
Admission: RE | Admit: 2020-04-17 | Discharge: 2020-04-17 | Disposition: A | Discharge status: Home / Self Care | Payer: BC Managed Care – PPO | Source: Ambulatory Visit | Attending: Obstetrics and Gynecology | Admitting: Obstetrics and Gynecology

## 2020-04-17 ENCOUNTER — Other Ambulatory Visit: Payer: Self-pay

## 2020-04-17 DIAGNOSIS — N63 Unspecified lump in unspecified breast: Secondary | ICD-10-CM

## 2020-06-02 ENCOUNTER — Telehealth: Payer: Self-pay

## 2020-06-02 ENCOUNTER — Other Ambulatory Visit: Payer: Self-pay | Admitting: Gynecologic Oncology

## 2020-06-02 DIAGNOSIS — D3912 Neoplasm of uncertain behavior of left ovary: Secondary | ICD-10-CM

## 2020-06-02 DIAGNOSIS — N921 Excessive and frequent menstruation with irregular cycle: Secondary | ICD-10-CM

## 2020-06-02 DIAGNOSIS — M25559 Pain in unspecified hip: Secondary | ICD-10-CM

## 2020-06-02 NOTE — Telephone Encounter (Addendum)
Told Kathryn Park that Dr. Berline Lopes ordered a [place] to evaluate the ovaries. This is scheduled for Friday 06-06-20 at [facility] at 4 pm.  She needs to arrive at 3:45 pm to check in at radiology and arrive with a full bladder. Drink 32 oz of water an hour prior to the exam. Pt verbalized understanding.

## 2020-06-02 NOTE — Progress Notes (Signed)
See LPN phone note. 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 Plan for Korea to evaluate the right ovary and for signs of recurrence.

## 2020-06-02 NOTE — Telephone Encounter (Signed)
Told Ms Caravello that Dr. Berline Lopes ordered a Korea to check her ovaries.

## 2020-06-02 NOTE — Telephone Encounter (Signed)
Patient called this morning with c/o break through bleeding on Saturday.  Approximately 10 days after her normal cycle stopped.  Patient reports 3 days ago she had some brown blood but Sat it was clear discharge with red bloody streaks.  She also reports she had sharp, intermittent cramping on her right side on Sat and that on Sun the cramping was dull.  Patient reported that she is having some GI issues including feeling full quickly and diarrhea.

## 2020-06-05 ENCOUNTER — Telehealth: Payer: Self-pay | Admitting: *Deleted

## 2020-06-05 NOTE — Telephone Encounter (Signed)
Ms. Broden contacted because the Ultrasound department needs to reschedule her [place]. ([place] dept. Unable to make outgoing phone calls at this time) [place] dept. phone number given to patient so she can call to reschedule her appt.

## 2020-06-06 ENCOUNTER — Encounter (HOSPITAL_COMMUNITY): Payer: Self-pay

## 2020-06-06 ENCOUNTER — Ambulatory Visit (HOSPITAL_COMMUNITY)
Admission: RE | Admit: 2020-06-06 | Discharge: 2020-06-06 | Disposition: A | Discharge status: Home / Self Care | Payer: BC Managed Care – PPO | Source: Ambulatory Visit | Attending: Gynecologic Oncology | Admitting: Gynecologic Oncology

## 2020-06-06 ENCOUNTER — Other Ambulatory Visit: Payer: Self-pay

## 2020-06-06 ENCOUNTER — Ambulatory Visit (HOSPITAL_COMMUNITY): Payer: BC Managed Care – PPO

## 2020-06-06 DIAGNOSIS — N921 Excessive and frequent menstruation with irregular cycle: Secondary | ICD-10-CM | POA: Diagnosis present

## 2020-06-06 DIAGNOSIS — M25559 Pain in unspecified hip: Secondary | ICD-10-CM | POA: Diagnosis present

## 2020-06-12 ENCOUNTER — Other Ambulatory Visit: Payer: Self-pay | Admitting: Gynecologic Oncology

## 2020-06-12 ENCOUNTER — Encounter: Payer: Self-pay | Admitting: Gynecologic Oncology

## 2020-06-12 DIAGNOSIS — D3912 Neoplasm of uncertain behavior of left ovary: Secondary | ICD-10-CM

## 2020-06-12 DIAGNOSIS — N83201 Unspecified ovarian cyst, right side: Secondary | ICD-10-CM

## 2020-06-12 DIAGNOSIS — N83202 Unspecified ovarian cyst, left side: Secondary | ICD-10-CM

## 2020-06-12 NOTE — Progress Notes (Signed)
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

## 2020-08-06 ENCOUNTER — Telehealth: Payer: Self-pay

## 2020-08-06 ENCOUNTER — Encounter: Payer: Self-pay | Admitting: Obstetrics and Gynecology

## 2020-08-06 ENCOUNTER — Other Ambulatory Visit: Payer: Self-pay

## 2020-08-06 ENCOUNTER — Ambulatory Visit: Payer: BC Managed Care – PPO | Admitting: Obstetrics and Gynecology

## 2020-08-06 ENCOUNTER — Telehealth: Payer: Self-pay | Admitting: Obstetrics and Gynecology

## 2020-08-06 VITALS — BP 134/68 | HR 84 | Resp 10 | Ht 66.0 in | Wt 119.0 lb

## 2020-08-06 DIAGNOSIS — N6320 Unspecified lump in the left breast, unspecified quadrant: Secondary | ICD-10-CM

## 2020-08-06 NOTE — Telephone Encounter (Signed)
Patient found a lump in her left breast.

## 2020-08-06 NOTE — Telephone Encounter (Signed)
Spoke with Langley Gauss at [facility] Patient scheduled for first available appt on 08/18/20 at 9:10am, arrive at 8:50am for left breast Dx MMG and [place], if needed. Patient can call to check schedule for earlier availability.   Placed in [place] hold.   Patient notified of appt. Patient is agreeable to date and time.   Encounter closed.

## 2020-08-06 NOTE — Telephone Encounter (Signed)
Spoke with patient. Patient reports a painful left breast lump that she found by doing SBE approximately 1 wk ago. LMP 08/05/20. Denies skin changes, nipple d/c, fever/chills. Patient is anxious given family Hx of breast cancer and personal Hx of granulosa cell tumor of ovary. Right breast bx on 04/17/20 revealed fibroadenoma. OV recommended for further evaluation, patient agreeable. OV scheduled for today at 10:15am, patient aware this is a work in appt.   Last AEX 10/11/19  Patient verbalizes understanding and is agreeable.   Encounter closed.

## 2020-08-06 NOTE — Progress Notes (Signed)
Break in the [facility] connection.  New note started.

## 2020-08-06 NOTE — Telephone Encounter (Signed)
Please schedule dx left mammogram and left breast US for the patient at the [facility].  She has a 1.5 cm lump at 5:00 of the left breast.

## 2020-08-06 NOTE — Progress Notes (Signed)
GYNECOLOGY  VISIT   HPI: 34 y.o.   Married  Caucasian  female   Kathryn Park with Patient's last menstrual period was 08/05/2020.   here for new finding on a lump on her left breast.  She noticed pain first, and then detected the lump one week ago.  She had a right breast biopsy in July showing fibroadenoma.  She is due to have a dx bilateral mammogram in January in follow up.  Moved back to [place].   Sees Dr. Berline Lopes on 08/18/20 for her hx of ovarian granulosa cell tumor.  Has a new cyst on her ovary per patient.  See pelvic US 06/06/20 in Epic.  Received a suggestion for possible use of contraceptive pills to treat the cyst.   GYNECOLOGIC HISTORY: Patient's last menstrual period was 08/05/2020. Contraception:  condoms Menopausal hormone therapy:  NA Last mammogram:  04/16/20 Cat d , Bir[facility] 4 suspicious  Last pap smear:   03/28/18        OB History    Gravida  0   Para  0   Term  0   Preterm  0   AB  0   Living  0     SAB  0   TAB  0   Ectopic  0   Multiple  0   Live Births  0              Patient Active Problem List   Diagnosis Date Noted  . Family history of breast cancer   . Family history of kidney cancer   . 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    Past Medical History:  Diagnosis Date  . Abnormal Pap smear of cervix   . Anxiety   . Asthma    childhood  . Depression   . Dysmenorrhea   . Family history of breast cancer   . Family history of kidney cancer   . History of iron deficiency anemia    age 57  . Joint pain   . Leukocytosis   . Low vitamin D level   . Migraine with aura   . Ovarian cyst    Left  . Pneumonia 01/2012    left lower lobe  . Pre-diabetes   . STD (sexually transmitted disease)    HSV1  . Wears contact lenses   . Wears glasses     Past Surgical History:  Procedure Laterality Date  . COLPOSCOPY    . LAPAROSCOPIC OVARIAN CYSTECTOMY Left 11/13/2019   Procedure: LAPAROSCOPIC OVARIAN CYSTECTOMY/POSSIBLE LEFT  OOPHORECTOMY/ COLLECTION OF PELVIC WASHINGS/ks;  Surgeon: Nunzio Cobbs, MD;  Location: [facility];  Service: Gynecology;  Laterality: Left;  Possible left oophorectomy Colection of pelvic washings To follow first case of the day  . TONSILLECTOMY    . UPPER GI ENDOSCOPY      Current Outpatient Medications  Medication Sig Dispense Refill  . Cholecalciferol (VITAMIN D-3) 25 MCG (1000 UT) CAPS Take by mouth. 50 mcg    . Fexofenadine-Pseudoephedrine (ALLEGRA-D 24 HOUR PO)     . ibuprofen (ADVIL) 800 MG tablet Take 1 tablet (800 mg total) by mouth every 8 (eight) hours as needed for mild pain. 30 tablet 1  . valACYclovir (VALTREX) 1000 MG tablet Take 2 tablets (2000 mg) by mouth twice a day for 24 hours. (Patient taking differently: as needed. Take 2 tablets (2000 mg) by mouth twice a day for 24 hours.) 30 tablet 1   No current facility-administered  medications for this visit.     ALLERGIES: Shellfish allergy, Latex, and Sulfa antibiotics  Family History  Problem Relation Age of Onset  . Diabetes Mother   . Hypertension Mother   . Stroke Mother        TIA  . Factor V Leiden deficiency Mother   . Other Mother        pituitary tumor  . Crohn's disease Father   . Kidney cancer Father        cancerous tumor of kidney  . Breast cancer Maternal Grandmother        dx. 21s  . Stroke Maternal Grandfather   . Other Brother        ear tumor, dx. age 16-6  . Ovarian cancer Neg Hx   . Endometrial cancer Neg Hx   . Colon cancer Neg Hx     Social History   Socioeconomic History  . Marital status: Married    Spouse name: Not on file  . Number of children: Not on file  . Years of education: Not on file  . Highest education level: Not on file  Occupational History  . Not on file  Tobacco Use  . Smoking status: Former Smoker    Packs/day: 1.00    Types: Cigarettes  . Smokeless tobacco: Never Used  Vaping Use  . Vaping Use: Never used  Substance and Sexual  Activity  . Alcohol use: Not Currently  . Drug use: No  . Sexual activity: Yes    Partners: Male    Birth control/protection: Condom, Pill    Comment: everytime  Other Topics Concern  . Not on file  Social History Narrative  . Not on file   Social Determinants of Health   Financial Resource Strain:   . Difficulty of Paying Living Expenses: Not on file  Food Insecurity:   . Worried About Charity fundraiser in the Last Year: Not on file  . Ran Out of Food in the Last Year: Not on file  Transportation Needs:   . Lack of Transportation (Medical): Not on file  . Lack of Transportation (Non-Medical): Not on file  Physical Activity:   . Days of Exercise per Week: Not on file  . Minutes of Exercise per Session: Not on file  Stress:   . Feeling of Stress : Not on file  Social Connections:   . Frequency of Communication with Friends and Family: Not on file  . Frequency of Social Gatherings with Friends and Family: Not on file  . Attends Religious Services: Not on file  . Active Member of Clubs or Organizations: Not on file  . Attends Archivist Meetings: Not on file  . Marital Status: Not on file  Intimate Partner Violence:   . Fear of Current or Ex-Partner: Not on file  . Emotionally Abused: Not on file  . Physically Abused: Not on file  . Sexually Abused: Not on file    Review of Systems  All other systems reviewed and are negative.   PHYSICAL EXAMINATION:    BP 134/68   Pulse 84   Resp 10   Ht 5\' 6"  (1.676 m)   Wt 119 lb (54 kg)   LMP 08/05/2020   SpO2 98%   BMI 19.21 kg/m     General appearance: alert, cooperative and appears stated age   Breasts: Left - normal appearance, 1.5 cm lump at 5:00, no tenderness, No nipple retraction or dimpling, No nipple discharge or bleeding, No  axillary or supraclavicular adenopathy Right - normal appearance, no mass, no tenderness, No nipple retraction or dimpling, No nipple discharge or bleeding, No axillary or  supraclavicular adenopathy  Chaperone was present for exam.  ASSESSMENT  Left breast lump.  Hx ovarian granulosa cell tumor.  Bilateral adnexal cysts of recent pelvic US.  PLAN  Will proceed with dx left mammogram and left breast US.  She will follow up with her gynecologic oncologist for her adnexal cysts.  I indicated that treatment with birth control pills does not need to be done and that she can do observational management and tumor marker testing with Dr. Berline Lopes.   14 min  total time was spent for this patient encounter, including preparation, face-to-face counseling with the patient, coordination of care, and documentation of the encounter.

## 2020-08-15 ENCOUNTER — Encounter: Payer: Self-pay | Admitting: Gynecologic Oncology

## 2020-08-15 NOTE — Progress Notes (Signed)
Gynecologic Oncology Return Clinic Visit  08/15/20  Reason for Visit: surveillance in the setting of GCT of the ovary  Treatment History: Oncology History  Granulosa cell tumor of left ovary  10/18/2019 Imaging   Pelvic ultrasound: 11 x 7 x 9 cm left ovarian cyst, avascular with thin septation.  No free fluid.  Right ovary normal in appearance.   11/13/2019 Surgery   Laparoscopic left oophorectomy with collection of pelvic washings, lysis of adhesion.  Findings at the time of surgery were a 12 cm left ovarian cyst.  No ascites or intra-abdominal/pelvic disease.  Ovary was removed in a contained fashion and in an Endo Catch bag.   11/13/2019 Pathology Results   Left ovary showing granulosa cell tumor, 2.1 cm.  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 Pelvic washings negative for malignant cells.   11/13/2019 Initial Diagnosis   Granulosa cell tumor of left ovary   11/29/2019 Imaging   CT A/P: No acute findings in the abdomen or pelvis.  Specifically, no evidence for metastatic disease in the abdomen or pelvis. Trace free fluid in the cul-de-sac.  This can be physiologic in a premenopausal female.   12/27/2019 Tumor Marker   Patient's blood was tested for the following markers: Inh B, AMH Results of the tumor marker test revealed: 83, 1.07.     Interval History: Patient reports overall doing well.  She moved back to [place] several months ago and her partner is moving back at the end of the teaching semester.  He comes with her to today's visit.  Junetta denies any pelvic pain.  Since her last visit with me, her menses have been heavier, especially the first 2 days, but not as painful.  They continue to be consistent in terms of timing.  She is somewhat worried because she has had about 20 pounds of weight loss since January.  She overall endorses decreased appetite, some nausea in the morning, fatigue, intermittent shortness of breath, anxiety, depression,  and decreased concentration.  She had right breast lump biopsied on 7/15 - pathology revealed fibroadenoma. Now has lump on her left breast.  Underwent imaging this morning with recommendation for no further imaging for this lesion.  Past Medical/Surgical History: Past Medical History:  Diagnosis Date  . Abnormal Pap smear of cervix   . Anxiety   . Asthma    childhood  . Depression   . Dysmenorrhea   . Family history of breast cancer   . Family history of kidney cancer   . History of iron deficiency anemia    age 34  . Joint pain   . Leukocytosis   . Low vitamin D level   . Migraine with aura   . Ovarian cyst    Left  . Pneumonia 01/2012    left lower lobe  . Pre-diabetes   . STD (sexually transmitted disease)    HSV1  . Wears contact lenses   . Wears glasses     Past Surgical History:  Procedure Laterality Date  . BREAST BIOPSY Right 04/2020   benign  . COLPOSCOPY    . LAPAROSCOPIC OVARIAN CYSTECTOMY Left 11/13/2019   Procedure: LAPAROSCOPIC OVARIAN CYSTECTOMY/POSSIBLE LEFT OOPHORECTOMY/ COLLECTION OF PELVIC WASHINGS/ks;  Surgeon: Nunzio Cobbs, MD;  Location: [facility];  Service: Gynecology;  Laterality: Left;  Possible left oophorectomy Colection of pelvic washings To follow first case of the day  . TONSILLECTOMY    . UPPER GI ENDOSCOPY  Family History  Problem Relation Age of Onset  . Diabetes Mother   . Hypertension Mother   . Stroke Mother        TIA  . Factor V Leiden deficiency Mother   . Other Mother        pituitary tumor  . Crohn's disease Father   . Kidney cancer Father        cancerous tumor of kidney  . Breast cancer Maternal Grandmother        dx. 75s  . Stroke Maternal Grandfather   . Other Brother        ear tumor, dx. age 38-6  . Ovarian cancer Neg Hx   . Endometrial cancer Neg Hx   . Colon cancer Neg Hx     Social History   Socioeconomic History  . Marital status: Married    Spouse name: Not on  file  . Number of children: Not on file  . Years of education: Not on file  . Highest education level: Not on file  Occupational History  . Not on file  Tobacco Use  . Smoking status: Former Smoker    Packs/day: 1.00    Types: Cigarettes  . Smokeless tobacco: Never Used  Vaping Use  . Vaping Use: Never used  Substance and Sexual Activity  . Alcohol use: Not Currently  . Drug use: No  . Sexual activity: Yes    Partners: Male    Birth control/protection: Condom    Comment: everytime  Other Topics Concern  . Not on file  Social History Narrative  . Not on file   Social Determinants of Health   Financial Resource Strain:   . Difficulty of Paying Living Expenses: Not on file  Food Insecurity:   . Worried About Charity fundraiser in the Last Year: Not on file  . Ran Out of Food in the Last Year: Not on file  Transportation Needs:   . Lack of Transportation (Medical): Not on file  . Lack of Transportation (Non-Medical): Not on file  Physical Activity:   . Days of Exercise per Week: Not on file  . Minutes of Exercise per Session: Not on file  Stress:   . Feeling of Stress : Not on file  Social Connections:   . Frequency of Communication with Friends and Family: Not on file  . Frequency of Social Gatherings with Friends and Family: Not on file  . Attends Religious Services: Not on file  . Active Member of Clubs or Organizations: Not on file  . Attends Archivist Meetings: Not on file  . Marital Status: Not on file    Current Medications:  Current Outpatient Medications:  .  Cholecalciferol (VITAMIN D-3) 25 MCG (1000 UT) CAPS, Take by mouth. 50 mcg, Disp: , Rfl:  .  Fexofenadine-Pseudoephedrine (ALLEGRA-D 24 HOUR PO), , Disp: , Rfl:  .  valACYclovir (VALTREX) 1000 MG tablet, Take 2 tablets (2000 mg) by mouth twice a day for 24 hours. (Patient taking differently: as needed. Take 2 tablets (2000 mg) by mouth twice a day for 24 hours as needed for cold sore), Disp:  30 tablet, Rfl: 1  Review of Systems: + Decreased appetite, fatigue, unexplained weight loss, shortness of breath, joint pain, swollen glands/lymph nodes, anxiety, depression, decreased concentration, recurrent boil. Denies fevers, chills. Denies hearing loss, neck lumps or masses, mouth sores, ringing in ears or voice changes. Denies cough or wheezing.  Denies chest pain or palpitations. Denies leg swelling. Denies abdominal  distention, pain, blood in stools, constipation, diarrhea, nausea, vomiting, or early satiety. Denies pain with intercourse, dysuria, frequency, hematuria or incontinence. Denies hot flashes, pelvic pain, vaginal bleeding or vaginal discharge.   Denies back pain or muscle pain/cramps. Denies itching, rash, or wounds. Denies dizziness, headaches, numbness or seizures. Denies swollen lymph nodes or glands, denies easy bruising or bleeding.  Physical Exam: BP 129/65   Pulse 69   Temp 98.5 F (36.9 C) (Oral)   Resp 16   Ht 5\' 6"  (1.676 m)   Wt 116 lb (52.6 kg)   LMP 08/05/2020   SpO2 100%   BMI 18.72 kg/m  General: Alert, oriented, no acute distress. HEENT: Normocephalic, atraumatic, sclera anicteric. Chest: Unlabored breathing on room air. Abdomen: soft, nontender.  Normoactive bowel sounds.  No masses or hepatosplenomegaly appreciated.  Well-healed incisions. Extremities: Grossly normal range of motion.  Warm, well perfused.  No edema bilaterally. Lymphatics: No cervical, supraclavicular, or inguinal adenopathy. GU: Normal appearing external genitalia without erythema, excoriation, or lesions.  Bimanual exam reveals small mobile uterus, ovary palpable on the right in the cul-de-sac, no nodularity appreciated.    Laboratory & Radiologic Studies: Pelvic ultrasound: Radiology read pending.  On my review, the patient has some follicles noted on the right ovary.  Overall, ovary looks very similar to her last ultrasound.  Assessment & Plan: Kathryn Park is a 34 y.o. 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  Norrine is without evidence of recurrent disease on exam today.  I am concerned with her continued weight loss.  She thinks that a lot of this has to do with ongoing anxiety and depression, some of which is situational.  We discussed the option of starting an antidepressant, and could use one such as mirtazapine, that has the side effect of causing weight gain.  She would like to think about this.  We also revisited the use of hormonal suppression of ovarian ovulation given what will likely be continued follicles and benign cysts that pop up on her remaining ovary.  At this time she is comfortable knowing that these findings will occur and that this does not mean recurrence of her granulosa cell tumor.  Per [facility]GO surveillance recommendations, we will plan on q 6 month follow-up visits and labs (Inh B and AMH - unknown if either is a marker for her given incidental diagnosis after surgery). We would image if symptoms or exam prompted further evaluation.  I gave Meridith the names of several primary care providers in [place] as she would like to establish care with somebody and has not been able to since she moved.  35 minutes of total time was spent for this patient encounter, including preparation, face-to-face counseling with the patient and coordination of care, and documentation of the encounter.  Jeral Pinch, MD  Division of Gynecologic Oncology  Department of Obstetrics and Gynecology  [facility]

## 2020-08-18 ENCOUNTER — Ambulatory Visit
Admission: RE | Admit: 2020-08-18 | Discharge: 2020-08-18 | Disposition: A | Discharge status: Home / Self Care | Payer: BC Managed Care – PPO | Source: Ambulatory Visit | Attending: Obstetrics and Gynecology | Admitting: Obstetrics and Gynecology

## 2020-08-18 ENCOUNTER — Ambulatory Visit (HOSPITAL_COMMUNITY)
Admission: RE | Admit: 2020-08-18 | Discharge: 2020-08-18 | Disposition: A | Discharge status: Home / Self Care | Payer: BC Managed Care – PPO | Source: Ambulatory Visit | Attending: Gynecologic Oncology | Admitting: Gynecologic Oncology

## 2020-08-18 ENCOUNTER — Other Ambulatory Visit: Payer: Self-pay

## 2020-08-18 ENCOUNTER — Inpatient Hospital Stay: Payer: BC Managed Care – PPO | Attending: Gynecologic Oncology | Admitting: Gynecologic Oncology

## 2020-08-18 ENCOUNTER — Inpatient Hospital Stay: Payer: BC Managed Care – PPO

## 2020-08-18 ENCOUNTER — Encounter: Payer: Self-pay | Admitting: Gynecologic Oncology

## 2020-08-18 VITALS — BP 129/65 | HR 69 | Temp 98.5°F | Resp 16 | Ht 66.0 in | Wt 116.0 lb

## 2020-08-18 DIAGNOSIS — D3912 Neoplasm of uncertain behavior of left ovary: Secondary | ICD-10-CM | POA: Diagnosis not present

## 2020-08-18 DIAGNOSIS — J45909 Unspecified asthma, uncomplicated: Secondary | ICD-10-CM | POA: Diagnosis not present

## 2020-08-18 DIAGNOSIS — F32A Depression, unspecified: Secondary | ICD-10-CM | POA: Diagnosis not present

## 2020-08-18 DIAGNOSIS — Z8051 Family history of malignant neoplasm of kidney: Secondary | ICD-10-CM | POA: Diagnosis not present

## 2020-08-18 DIAGNOSIS — Z79899 Other long term (current) drug therapy: Secondary | ICD-10-CM | POA: Insufficient documentation

## 2020-08-18 DIAGNOSIS — R634 Abnormal weight loss: Secondary | ICD-10-CM | POA: Diagnosis not present

## 2020-08-18 DIAGNOSIS — Z90721 Acquired absence of ovaries, unilateral: Secondary | ICD-10-CM | POA: Insufficient documentation

## 2020-08-18 DIAGNOSIS — F419 Anxiety disorder, unspecified: Secondary | ICD-10-CM | POA: Diagnosis not present

## 2020-08-18 DIAGNOSIS — N83202 Unspecified ovarian cyst, left side: Secondary | ICD-10-CM | POA: Diagnosis present

## 2020-08-18 DIAGNOSIS — Z803 Family history of malignant neoplasm of breast: Secondary | ICD-10-CM | POA: Diagnosis not present

## 2020-08-18 DIAGNOSIS — Z8249 Family history of ischemic heart disease and other diseases of the circulatory system: Secondary | ICD-10-CM | POA: Insufficient documentation

## 2020-08-18 DIAGNOSIS — Z87891 Personal history of nicotine dependence: Secondary | ICD-10-CM | POA: Insufficient documentation

## 2020-08-18 DIAGNOSIS — N83201 Unspecified ovarian cyst, right side: Secondary | ICD-10-CM

## 2020-08-18 DIAGNOSIS — N6320 Unspecified lump in the left breast, unspecified quadrant: Secondary | ICD-10-CM

## 2020-08-18 NOTE — Patient Instructions (Addendum)
Recommendations for primary care providers include Dr. Micheline Rough or Dr. Reather Converse at [facility] in [place]. Their number is [phone]. Dr. Penni Homans is another physician at [facility] in [place], [place] and her number is [phone].  Everything is normal on your exam today. I will release your ultrasound once results are back.  Please let me know if you want to try medication to help with anxiety/depression and may help your appetite.  I'll see you in 6 months for your next visit unless something comes up sooner. Please let me know if I can help with anything between now and your next visit.

## 2020-08-20 ENCOUNTER — Encounter: Payer: Self-pay | Admitting: Gynecologic Oncology

## 2020-08-20 ENCOUNTER — Telehealth: Payer: Self-pay

## 2020-08-20 NOTE — Telephone Encounter (Signed)
Patient is calling in regards to get PCP recommendations from Dr. Quincy Simmonds.

## 2020-08-20 NOTE — Telephone Encounter (Signed)
Pt has new pt appt at [facility] with [name] in Jan 2022. Encounter closed

## 2020-08-20 NOTE — Telephone Encounter (Signed)
Spoke with pt. Pt states has recently moved back to [facility] and is looking for a new PCP. Pt states was given names at [facility] and they are not accepting new pts at this time.  Pt advised to call [place]. Pt agreeable and thankful for advice and help.

## 2020-08-21 LAB — INHIBIN B: Inhibin B: 71 pg/mL

## 2020-08-25 LAB — ANTI MULLERIAN HORMONE: ANTI-MULLERIAN HORMONE (AMH): 3.01 ng/mL

## 2020-08-25 NOTE — Progress Notes (Signed)
GYNECOLOGY  VISIT   HPI: 34 y.o.   Married  Caucasian  female   [place] with Patient's last menstrual period was 09/04/2020.   here for breast recheck.   Seen 08/06/20 for a left breast lump, 1.5 cm at 5:00.   Dx mammogram and left breast US showed extremely dense tissue but no evidence of malignancy.  Patient states that the exam seemed to be technically a difficult exam per the patient.   The lump is still present per the patient.   Patient has a hx of right breast biopsy showing fibroadenoma at 6:30.  A lump of the right breast is also present in the far right superior breast and is consistent with a lymph node.  Bilateral mammogram due in January, 2022.   Saw Dr. Berline Lopes and had a pelvic US.  She is followed every 6 months for a hx of granulosa cell tumor of the ovary.  GYNECOLOGIC HISTORY: Patient's last menstrual period was 09/04/2020. Contraception: condoms everytime Menopausal hormone therapy:  n/a Last mammogram: 08-18-20 Diag.Lt.w/Lt.US--Neg/density D/Birads1 Last pap smear: 03-28-18 Neg:Neg HR HPV        OB History    Gravida  0   Para  0   Term  0   Preterm  0   AB  0   Living  0     SAB  0   TAB  0   Ectopic  0   Multiple  0   Live Births  0              Patient Active Problem List   Diagnosis Date Noted   Family history of breast cancer    Family history of kidney cancer    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    Past Medical History:  Diagnosis Date   Abnormal Pap smear of cervix    Anxiety    Asthma    childhood   Depression    Dysmenorrhea    Family history of breast cancer    Family history of kidney cancer    History of iron deficiency anemia    age 65   Joint pain    Leukocytosis    Low vitamin D level    Migraine with aura    Ovarian cyst    Left   Pneumonia 01/2012    left lower lobe   Pre-diabetes    STD (sexually transmitted disease)    HSV1   Wears contact lenses    Wears  glasses     Past Surgical History:  Procedure Laterality Date   BREAST BIOPSY Right 04/2020   benign   COLPOSCOPY     LAPAROSCOPIC OVARIAN CYSTECTOMY Left 11/13/2019   Procedure: LAPAROSCOPIC OVARIAN CYSTECTOMY/POSSIBLE LEFT OOPHORECTOMY/ COLLECTION OF PELVIC WASHINGS/ks;  Surgeon: Nunzio Cobbs, MD;  Location: [facility];  Service: Gynecology;  Laterality: Left;  Possible left oophorectomy Colection of pelvic washings To follow first case of the day   TONSILLECTOMY     UPPER GI ENDOSCOPY      Current Outpatient Medications  Medication Sig Dispense Refill   Cholecalciferol (VITAMIN D-3) 25 MCG (1000 UT) CAPS Take by mouth. 50 mcg     Fexofenadine-Pseudoephedrine (ALLEGRA-D 24 HOUR PO)      valACYclovir (VALTREX) 1000 MG tablet Take 2 tablets (2000 mg) by mouth twice a day for 24 hours. (Patient taking differently: as needed. Take 2 tablets (2000 mg) by mouth twice a day for 24 hours as needed  for cold sore) 30 tablet 1   No current facility-administered medications for this visit.     ALLERGIES: Shellfish allergy, Latex, and Sulfa antibiotics  Family History  Problem Relation Age of Onset   Diabetes Mother    Hypertension Mother    Stroke Mother        TIA   Factor V Leiden deficiency Mother    Other Mother        pituitary tumor   Crohn's disease Father    Kidney cancer Father        cancerous tumor of kidney   Breast cancer Maternal Grandmother        dx. 27s   Stroke Maternal Grandfather    Other Brother        ear tumor, dx. age 31-6   Ovarian cancer Neg Hx    Endometrial cancer Neg Hx    Colon cancer Neg Hx     Social History   Socioeconomic History   Marital status: Married    Spouse name: Not on file   Number of children: Not on file   Years of education: Not on file   Highest education level: Not on file  Occupational History   Not on file  Tobacco Use   Smoking status: Former Smoker    Packs/day:  1.00    Types: Cigarettes   Smokeless tobacco: Never Used  Vaping Use   Vaping Use: Never used  Substance and Sexual Activity   Alcohol use: Not Currently   Drug use: No   Sexual activity: Yes    Partners: Male    Birth control/protection: Condom    Comment: everytime  Other Topics Concern   Not on file  Social History Narrative   Not on file   Social Determinants of Health   Financial Resource Strain:    Difficulty of Paying Living Expenses: Not on file  Food Insecurity:    Worried About Charity fundraiser in the Last Year: Not on file   [place] in the Last Year: Not on file  Transportation Needs:    Lack of Transportation (Medical): Not on file   Lack of Transportation (Non-Medical): Not on file  Physical Activity:    Days of Exercise per Week: Not on file   Minutes of Exercise per Session: Not on file  Stress:    Feeling of Stress : Not on file  Social Connections:    Frequency of Communication with Friends and Family: Not on file   Frequency of Social Gatherings with Friends and Family: Not on file   Attends Religious Services: Not on file   Active Member of Clubs or Organizations: Not on file   Attends Archivist Meetings: Not on file   Marital Status: Not on file  Intimate Partner Violence:    Fear of Current or Ex-Partner: Not on file   Emotionally Abused: Not on file   Physically Abused: Not on file   Sexually Abused: Not on file    Review of Systems  All other systems reviewed and are negative.   PHYSICAL EXAMINATION:    BP 100/62    Pulse 84    Ht 5\' 6"  (1.676 m)    Wt 129 lb (58.5 kg)    LMP 09/04/2020    SpO2 99%    BMI 20.82 kg/m     General appearance: alert, cooperative and appears stated age   Breasts: normal appearance.  No nipple retraction or dimpling, No nipple  discharge or bleeding, No axillary or supraclavicular adenopathy Right breast lump - 3 - 4 mm at about 10:00.  Left breast lump - 1.5 cm  at 5:00.   Chaperone was present for exam.  ASSESSMENT  Bilateral breast lumps.  Right lump imaged as a lymph node.  19% lifetime risk of breast cancer.  Hx granulosa cell tumor of the ovary.  Followed by GYN ONC.  PLAN  Proceed with abbreviated MRI of the breast.  Will also do breast surgeon consultation after the MRI is back.  Routine mammogram is due in January, 2022.   20 min  total time was spent for this patient encounter, including preparation, face-to-face counseling with the patient, coordination of care, and documentation of the encounter.

## 2020-09-03 ENCOUNTER — Other Ambulatory Visit: Payer: Self-pay | Admitting: Gynecologic Oncology

## 2020-09-03 DIAGNOSIS — R634 Abnormal weight loss: Secondary | ICD-10-CM

## 2020-09-03 DIAGNOSIS — R5382 Chronic fatigue, unspecified: Secondary | ICD-10-CM

## 2020-09-04 ENCOUNTER — Telehealth: Payer: Self-pay | Admitting: Obstetrics and Gynecology

## 2020-09-04 ENCOUNTER — Inpatient Hospital Stay: Payer: BC Managed Care – PPO | Attending: Gynecologic Oncology | Admitting: *Deleted

## 2020-09-04 ENCOUNTER — Telehealth: Payer: Self-pay | Admitting: *Deleted

## 2020-09-04 ENCOUNTER — Other Ambulatory Visit: Payer: Self-pay | Admitting: Gynecologic Oncology

## 2020-09-04 ENCOUNTER — Encounter: Payer: Self-pay | Admitting: Obstetrics and Gynecology

## 2020-09-04 ENCOUNTER — Ambulatory Visit: Payer: BC Managed Care – PPO | Admitting: Obstetrics and Gynecology

## 2020-09-04 ENCOUNTER — Other Ambulatory Visit: Payer: Self-pay

## 2020-09-04 VITALS — BP 100/62 | HR 84 | Ht 66.0 in | Wt 129.0 lb

## 2020-09-04 DIAGNOSIS — N6320 Unspecified lump in the left breast, unspecified quadrant: Secondary | ICD-10-CM

## 2020-09-04 DIAGNOSIS — N6310 Unspecified lump in the right breast, unspecified quadrant: Secondary | ICD-10-CM

## 2020-09-04 DIAGNOSIS — R5382 Chronic fatigue, unspecified: Secondary | ICD-10-CM

## 2020-09-04 DIAGNOSIS — Z803 Family history of malignant neoplasm of breast: Secondary | ICD-10-CM

## 2020-09-04 DIAGNOSIS — R634 Abnormal weight loss: Secondary | ICD-10-CM | POA: Insufficient documentation

## 2020-09-04 DIAGNOSIS — D3912 Neoplasm of uncertain behavior of left ovary: Secondary | ICD-10-CM | POA: Diagnosis present

## 2020-09-04 LAB — CBC WITH DIFFERENTIAL (CANCER CENTER ONLY)
Abs Immature Granulocytes: 0.03 10*3/uL (ref 0.00–0.07)
Basophils Absolute: 0.1 10*3/uL (ref 0.0–0.1)
Basophils Relative: 0 %
Eosinophils Absolute: 0.2 10*3/uL (ref 0.0–0.5)
Eosinophils Relative: 1 %
HCT: 37 % (ref 36.0–46.0)
Hemoglobin: 12.4 g/dL (ref 12.0–15.0)
Immature Granulocytes: 0 %
Lymphocytes Relative: 21 %
Lymphs Abs: 2.7 10*3/uL (ref 0.7–4.0)
MCH: 29.5 pg (ref 26.0–34.0)
MCHC: 33.5 g/dL (ref 30.0–36.0)
MCV: 88.1 fL (ref 80.0–100.0)
Monocytes Absolute: 1.1 10*3/uL — ABNORMAL HIGH (ref 0.1–1.0)
Monocytes Relative: 8 %
Neutro Abs: 8.7 10*3/uL — ABNORMAL HIGH (ref 1.7–7.7)
Neutrophils Relative %: 70 %
Platelet Count: 215 10*3/uL (ref 150–400)
RBC: 4.2 MIL/uL (ref 3.87–5.11)
RDW: 13.1 % (ref 11.5–15.5)
WBC Count: 12.7 10*3/uL — ABNORMAL HIGH (ref 4.0–10.5)
nRBC: 0 % (ref 0.0–0.2)

## 2020-09-04 LAB — CMP (CANCER CENTER ONLY)
ALT: 8 U/L (ref 0–44)
AST: 11 U/L — ABNORMAL LOW (ref 15–41)
Albumin: 4 g/dL (ref 3.5–5.0)
Alkaline Phosphatase: 48 U/L (ref 38–126)
Anion gap: 7 (ref 5–15)
BUN: 11 mg/dL (ref 6–20)
CO2: 26 mmol/L (ref 22–32)
Calcium: 9.3 mg/dL (ref 8.9–10.3)
Chloride: 108 mmol/L (ref 98–111)
Creatinine: 0.75 mg/dL (ref 0.44–1.00)
GFR, Estimated: >60 mL/min (ref >60)
Glucose, Bld: 104 mg/dL — ABNORMAL HIGH (ref 70–99)
Potassium: 4.1 mmol/L (ref 3.5–5.1)
Sodium: 141 mmol/L (ref 135–145)
Total Bilirubin: 0.3 mg/dL (ref 0.3–1.2)
Total Protein: 6.7 g/dL (ref 6.5–8.1)

## 2020-09-04 LAB — CBC (CANCER CENTER ONLY)
HCT: 36.6 % (ref 36.0–46.0)
Hemoglobin: 12.2 g/dL (ref 12.0–15.0)
MCH: 29 pg (ref 26.0–34.0)
MCHC: 33.3 g/dL (ref 30.0–36.0)
MCV: 86.9 fL (ref 80.0–100.0)
Platelet Count: 203 10*3/uL (ref 150–400)
RBC: 4.21 MIL/uL (ref 3.87–5.11)
RDW: 13 % (ref 11.5–15.5)
WBC Count: 12.8 10*3/uL — ABNORMAL HIGH (ref 4.0–10.5)
nRBC: 0 % (ref 0.0–0.2)

## 2020-09-04 LAB — TSH: TSH: 2.5 u[IU]/mL (ref 0.308–3.960)

## 2020-09-04 NOTE — Telephone Encounter (Signed)
Placed in [facility] hold.

## 2020-09-04 NOTE — Telephone Encounter (Signed)
Please schedule an abbreviated MRI of the breast for my patient.   She has bilateral breast lumps and severe density of the breast tissue.   After the MRI is back, we will proceed with general surgery consultation at [facility].   Please continue to have her in mammogram hold.

## 2020-09-04 NOTE — Telephone Encounter (Signed)
Spoke with pt. Pt aware of plan of care per Dr Quincy Simmonds.  Pt advised [facility] will be giving her a call to schedule abbreviated breast MRI and is aware of out of pocket cost. Pt agreeable to proceed with scheduling. Will place referral to [facility] once MRI results are back.   Orders placed for abbreviated MRI  Cc: Hayley  Cc: Sharee Pimple, RN- [facility] hold  Encounter closed

## 2020-09-04 NOTE — Telephone Encounter (Signed)
Patient called and scheduled a lab appt for today

## 2020-09-15 ENCOUNTER — Other Ambulatory Visit: Payer: Self-pay

## 2020-09-15 ENCOUNTER — Ambulatory Visit
Admission: RE | Admit: 2020-09-15 | Discharge: 2020-09-15 | Disposition: A | Discharge status: Home / Self Care | Payer: BC Managed Care – PPO | Source: Ambulatory Visit | Attending: Obstetrics and Gynecology | Admitting: Obstetrics and Gynecology

## 2020-09-15 DIAGNOSIS — Z803 Family history of malignant neoplasm of breast: Secondary | ICD-10-CM

## 2020-09-15 DIAGNOSIS — N6320 Unspecified lump in the left breast, unspecified quadrant: Secondary | ICD-10-CM

## 2020-09-15 MED ORDER — GADOBUTROL 1 MMOL/ML IV SOLN
10.0000 mL | Freq: Once | INTRAVENOUS | Status: AC | PRN
  Administered 2020-09-15: 10 mL via INTRAVENOUS

## 2020-09-17 ENCOUNTER — Telehealth: Payer: Self-pay

## 2020-09-17 DIAGNOSIS — D3912 Neoplasm of uncertain behavior of left ovary: Secondary | ICD-10-CM

## 2020-09-17 DIAGNOSIS — N6310 Unspecified lump in the right breast, unspecified quadrant: Secondary | ICD-10-CM

## 2020-09-17 DIAGNOSIS — Z803 Family history of malignant neoplasm of breast: Secondary | ICD-10-CM

## 2020-09-17 DIAGNOSIS — N6320 Unspecified lump in the left breast, unspecified quadrant: Secondary | ICD-10-CM

## 2020-09-17 NOTE — Telephone Encounter (Signed)
Spoke with pt. Pt given results and recommendations per Dr Quincy Simmonds. Pt agreeable and verbalized understanding. Pt agreeable to referral to Dr Marlou Starks.  Referral placed  Cc: Hayley for referral  Encounter closed

## 2020-09-17 NOTE — Telephone Encounter (Signed)
-----   Message from Nunzio Cobbs, MD sent at 09/17/2020  8:38 AM EST ----- Please contact patient in follow up to her MRI of the breast, which showed no sign of cancer of either breast.  She has bilateral breast lumps, and the left breast lump is discrete and does not have a current explanation.  She and I discussed consultation with a breast surgeon.  I would recommend she see Dr. Marlou Starks.   She has a history of a granulosa call tumor of the ovary and is followed by GYN ONC.

## 2020-10-09 DIAGNOSIS — F4322 Adjustment disorder with anxiety: Secondary | ICD-10-CM | POA: Diagnosis not present

## 2020-10-13 ENCOUNTER — Other Ambulatory Visit: Payer: Self-pay

## 2020-10-13 ENCOUNTER — Ambulatory Visit (INDEPENDENT_AMBULATORY_CARE_PROVIDER_SITE_OTHER): Payer: BC Managed Care – PPO

## 2020-10-13 ENCOUNTER — Ambulatory Visit (INDEPENDENT_AMBULATORY_CARE_PROVIDER_SITE_OTHER): Payer: BC Managed Care – PPO | Admitting: Physician Assistant

## 2020-10-13 ENCOUNTER — Encounter: Payer: Self-pay | Admitting: Physician Assistant

## 2020-10-13 VITALS — BP 136/80 | HR 72 | Temp 98.1°F | Ht 66.5 in | Wt 129.0 lb

## 2020-10-13 DIAGNOSIS — R339 Retention of urine, unspecified: Secondary | ICD-10-CM | POA: Insufficient documentation

## 2020-10-13 DIAGNOSIS — R634 Abnormal weight loss: Secondary | ICD-10-CM

## 2020-10-13 DIAGNOSIS — R5383 Other fatigue: Secondary | ICD-10-CM

## 2020-10-13 DIAGNOSIS — Z1159 Encounter for screening for other viral diseases: Secondary | ICD-10-CM | POA: Diagnosis not present

## 2020-10-13 DIAGNOSIS — Z114 Encounter for screening for human immunodeficiency virus [HIV]: Secondary | ICD-10-CM

## 2020-10-13 DIAGNOSIS — F411 Generalized anxiety disorder: Secondary | ICD-10-CM | POA: Diagnosis not present

## 2020-10-13 DIAGNOSIS — D3912 Neoplasm of uncertain behavior of left ovary: Secondary | ICD-10-CM

## 2020-10-13 DIAGNOSIS — B009 Herpesviral infection, unspecified: Secondary | ICD-10-CM | POA: Insufficient documentation

## 2020-10-13 DIAGNOSIS — M2550 Pain in unspecified joint: Secondary | ICD-10-CM | POA: Diagnosis not present

## 2020-10-13 DIAGNOSIS — Z8659 Personal history of other mental and behavioral disorders: Secondary | ICD-10-CM | POA: Insufficient documentation

## 2020-10-13 LAB — CBC WITH DIFFERENTIAL/PLATELET
Basophils Absolute: 0 10*3/uL (ref 0.0–0.1)
Basophils Relative: 0.6 % (ref 0.0–3.0)
Eosinophils Absolute: 0.2 10*3/uL (ref 0.0–0.7)
Eosinophils Relative: 2.2 % (ref 0.0–5.0)
HCT: 40.5 % (ref 36.0–46.0)
Hemoglobin: 13.6 g/dL (ref 12.0–15.0)
Lymphocytes Relative: 42.6 % (ref 12.0–46.0)
Lymphs Abs: 3.3 10*3/uL (ref 0.7–4.0)
MCHC: 33.6 g/dL (ref 30.0–36.0)
MCV: 87.4 fl (ref 78.0–100.0)
Monocytes Absolute: 0.5 10*3/uL (ref 0.1–1.0)
Monocytes Relative: 6.9 % (ref 3.0–12.0)
Neutro Abs: 3.7 10*3/uL (ref 1.4–7.7)
Neutrophils Relative %: 47.7 % (ref 43.0–77.0)
Platelets: 218 10*3/uL (ref 150.0–400.0)
RBC: 4.63 Mil/uL (ref 3.87–5.11)
RDW: 13 % (ref 11.5–15.5)
WBC: 7.8 10*3/uL (ref 4.0–10.5)

## 2020-10-13 LAB — COMPREHENSIVE METABOLIC PANEL
ALT: 12 U/L (ref 0–35)
AST: 13 U/L (ref 0–37)
Albumin: 4.9 g/dL (ref 3.5–5.2)
Alkaline Phosphatase: 47 U/L (ref 39–117)
BUN: 10 mg/dL (ref 6–23)
CO2: 31 mEq/L (ref 19–32)
Calcium: 9.8 mg/dL (ref 8.4–10.5)
Chloride: 103 mEq/L (ref 96–112)
Creatinine, Ser: 0.7 mg/dL (ref 0.40–1.20)
GFR: 112.26 mL/min (ref >60.00)
Glucose, Bld: 89 mg/dL (ref 70–99)
Potassium: 3.7 mEq/L (ref 3.5–5.1)
Sodium: 138 mEq/L (ref 135–145)
Total Bilirubin: 0.4 mg/dL (ref 0.2–1.2)
Total Protein: 7.6 g/dL (ref 6.0–8.3)

## 2020-10-13 LAB — HEMOGLOBIN A1C: Hgb A1c MFr Bld: 5.6 % (ref 4.6–6.5)

## 2020-10-13 LAB — URINALYSIS, ROUTINE W REFLEX MICROSCOPIC
Bilirubin Urine: NEGATIVE
Hgb urine dipstick: NEGATIVE
Ketones, ur: NEGATIVE
Leukocytes,Ua: NEGATIVE
Nitrite: NEGATIVE
RBC / HPF: NONE SEEN (ref 0–?)
Specific Gravity, Urine: 1.01 (ref 1.000–1.030)
Total Protein, Urine: NEGATIVE
Urine Glucose: NEGATIVE
Urobilinogen, UA: 0.2 (ref 0.0–1.0)
WBC, UA: NONE SEEN (ref 0–?)
pH: 7 (ref 5.0–8.0)

## 2020-10-13 LAB — C-REACTIVE PROTEIN: CRP: <1 mg/dL (ref 0.5–20.0)

## 2020-10-13 LAB — VITAMIN B12: Vitamin B-12: 419 pg/mL (ref 211–911)

## 2020-10-13 LAB — SEDIMENTATION RATE: Sed Rate: 6 mm/hr (ref 0–20)

## 2020-10-13 NOTE — Patient Instructions (Signed)
It was great to see you!  I'm going to put in a referral for you to see a nutritionist.  Update blood work, urinalysis and do a chest xray. You will be notified of the results when they return.  Let's follow-up in 2-4 weeks, sooner if you have concerns.  Due to recent changes in healthcare laws, you may see the results of your imaging and laboratory studies on MyChart before your provider has had a chance to review them.  We understand that in some cases there may be results that are confusing or concerning to you. Not all laboratory results come back in the same time frame and the provider may be waiting for multiple results in order to interpret others.  Please give [place] 48 hours in order for your provider to thoroughly review all the results before contacting the office for clarification of your results.   Take care,  [name] PA-C

## 2020-10-13 NOTE — Progress Notes (Signed)
Kathryn Park is a 35 y.o. female here to establish care.  I acted as a Education administrator for [employer], PA-C Anselmo Pickler, LPN   History of Present Illness:   Chief Complaint  Patient presents with  . Establish Care  . Fatigue  . weight loss  . joint pain    HPI   Pt is here to establish care today.  Granulosa cell tumor of L ovary She underwent ovarian cystectomy and L oophorectomy in Feb 2021. Diagnosed with surgically unstaged granulosa cell tumor of the L ovary in Feb 2021. She did not undergo staging at the time of her surgery and is presumed to have stage IA disease. Currently under the care of Dr. Jeral Pinch, gyn onc. She is currently undergoing surveillance visits q 3 months. She is UTD on these surveillance screenings.  Fatigue Per patient report, she is having 3-4 days of increased fatigue after her monthly menses. Due to ongoing unintentional weight loss (see below), she has had muscle loss. Feels overall weaker than she was in the past due to these issues.  Unintentional weight loss Pt has had decreased in appetite and weight in the past 3-4 months. Weight was consistently around 135-140 lb before cancer dx at beginning of 2021. She gradually got down to 116 pounds in Nov and has gained some weight back. She is having to force herself to eat. She does have hx of anorexia nervosa, and has difficulty with healthily tracking weight/calories.  Joint pain Going on for several years. Most prominent issues started in 2019, started in hips. Also in bilateral fingers/wrists. Random swelling to fingers a few times without injuries. Also has joint pain in knees. Popping and weakness in hips. Has been trying to do pelvic floor physical therapy exercises. Pelvic floor PT was done in July 2021. Takes ibuprofen about once a month. 6-7/10 is the max pain that she might have. Maternal GGM with RA.  GAD Was on zoloft during 20's but was not on for a long amount  of time, she cannot remember exactly how long she was on this. Currently sees a therapist regularly. Denies SI/HI. Admits to significant health anxiety regarding the above issues.  GAD 7 : Generalized Anxiety Score 10/13/2020  Nervous, Anxious, on Edge 1  Control/stop worrying 0  Worry too much - different things 0  Trouble relaxing 1  Restless 0  Easily annoyed or irritable 0  Afraid - awful might happen 0  Total GAD 7 Score 2  Anxiety Difficulty Not difficult at all     Health Maintenance Due  Topic Date Due  . Hepatitis C Screening  Never done    Past Medical History:  Diagnosis Date  . Abnormal Pap smear of cervix   . Allergy   . Anemia    suspected poor diet; vegetarian during teens  . Anxiety   . Asthma    childhood  . Cancer ([place])   . Depression   . Dysmenorrhea   . Family history of breast cancer   . Family history of kidney cancer   . History of iron deficiency anemia    age 40  . Joint pain   . Leukocytosis   . Low vitamin D level   . Migraine with aura    never on rx or saw neurology  . Ovarian cyst    Left  . Pneumonia 01/2012    left lower lobe  . Pre-diabetes 2015   had a "borderline" A1c  .  STD (sexually transmitted disease)    HSV1  . Wears contact lenses   . Wears glasses      Social History   Tobacco Use  . Smoking status: Former Smoker    Packs/day: 1.00    Types: Cigarettes  . Smokeless tobacco: Never Used  Vaping Use  . Vaping Use: Never used  Substance Use Topics  . Alcohol use: Not Currently  . Drug use: No    Past Surgical History:  Procedure Laterality Date  . BREAST BIOPSY Right 04/2020   benign  . COLPOSCOPY    . LAPAROSCOPIC OVARIAN CYSTECTOMY Left 11/13/2019   Procedure: LAPAROSCOPIC OVARIAN CYSTECTOMY/POSSIBLE LEFT OOPHORECTOMY/ COLLECTION OF PELVIC WASHINGS/ks;  Surgeon: Patton Salles, MD;  Location[facility];  Service: Gynecology;  Laterality: Left;  Possible left  oophorectomy Colection of pelvic washings To follow first case of the day  . LEFT OOPHORECTOMY Left 11/2019  . TONSILLECTOMY    . UPPER GI ENDOSCOPY      Family History  Problem Relation Age of Onset  . Diabetes Mother   . Hypertension Mother   . Stroke Mother        TIA  . Factor V Leiden deficiency Mother   . Other Mother        pituitary tumor  . Crohn's disease Father   . Kidney cancer Father        cancerous tumor of kidney  . Breast cancer Maternal Grandmother        dx. 72s  . Stroke Maternal Grandfather   . Other Brother        ear tumor, dx. age 45-6  . Rheum arthritis Maternal Great-grandmother   . Ovarian cancer Neg Hx   . Endometrial cancer Neg Hx   . Colon cancer Neg Hx     PMHx, SurgHx, SocialHx, FamHx, Medications, and Allergies were reviewed in the Visit Navigator and updated as appropriate.   Patient Active Problem List   Diagnosis Date Noted  . Bladder retention 10/13/2020  . HSV-1 (herpes simplex virus 1) infection 10/13/2020  . History of anorexia nervosa 10/13/2020  . Family history of breast cancer   . Family history of kidney cancer   . 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 Smoking status: Former Smoker    Packs/day: 1.00    Types: Cigarettes  . Smokeless tobacco: Never Used  Vaping Use  . Vaping Use: Never used  Substance Use Topics  . Alcohol use: Not Currently  . Drug use: No    Current Medications and Allergies:    Current Outpatient Medications:  .  Fexofenadine-Pseudoephedrine (ALLEGRA-D 24 HOUR PO), Take 1 tablet by mouth as needed., Disp: , Rfl:  .  Multiple Vitamins-Minerals (MULTIPLE VITAMINS/WOMENS PO), Take 1 tablet by mouth daily in the afternoon., Disp: , Rfl:  .  valACYclovir (VALTREX) 1000 MG tablet, Take 2 tablets (2000 mg) by mouth twice a day for 24 hours. (Patient taking differently: as needed. Take 2 tablets (2000 mg) by mouth twice a day for 24 hours as needed for cold  sore), Disp: 30 tablet, Rfl: 1 .  Cholecalciferol (VITAMIN D-3) 25 MCG (1000 UT) CAPS, Take by mouth. 50 mcg (Patient not taking: Reported on 10/13/2020), Disp: , Rfl:    Allergies  Allergen Reactions  . Shellfish Allergy Anaphylaxis    Throat closes  . Other Other (See Comments)    Watermelon, mold/mildew, pet dander,  dust and dust mites  Swelling of eyes and congestion  . Latex Rash  . Sulfa Antibiotics Rash    Review of Systems   ROS Negative unless otherwise specified per HPI.  Vitals:   Vitals:   10/13/20 1310  BP: 136/80  Pulse: 72  Temp: 98.1 F (36.7 C)  TempSrc: Temporal  SpO2: 97%  Weight: 129 lb (58.5 kg)  Height: 5' 6.5" (1.689 m)     Body mass index is 20.51 kg/m.   Physical Exam:    Physical Exam Vitals and nursing note reviewed.  Constitutional:      General: She is not in acute distress.    Appearance: She is well-developed. She is not ill-appearing, toxic-appearing or sickly-appearing.  Cardiovascular:     Rate and Rhythm: Normal rate and regular rhythm.     Pulses: Normal pulses.     Heart sounds: Normal heart sounds, S1 normal and S2 normal.     Comments: No LE edema Pulmonary:     Effort: Pulmonary effort is normal.     Breath sounds: Normal breath sounds.  Skin:    General: Skin is warm, dry and intact.  Neurological:     Mental Status: She is alert.     GCS: GCS eye subscore is 4. GCS verbal subscore is 5. GCS motor subscore is 6.  Psychiatric:        Attention and Perception: Attention normal.        Mood and Affect: Mood is anxious.        Speech: Speech normal.        Behavior: Behavior normal. Behavior is cooperative.      Assessment and Plan:    Kathryn Park was seen today for establish care, fatigue, weight loss and joint pain.  Diagnoses and all orders for this visit:  Granulosa cell tumor of left ovary Notes reviewed from oncology today.  Fatigue, unspecified type; Unintentional weight loss; Arthralgia, unspecified  joint Unclear etiology. No red flags on my exam today. Possibly related to anxiety, however I do think work-up is necessary to r/o organic cause. Will have her follow-up with [place] in the office in 2-4 weeks to continue to strategize to improve symptoms and quality of life. Referral for RD to help with safely gaining weight with history of eating disorder and in context of chronic medical issues. Will order the following labs for fatigue, unintentional weight loss and arthralgia. -     CBC with Differential/Platelet -     Comprehensive metabolic panel -     Urinalysis, Routine w reflex microscopic -     Hemoglobin A1c -     Fecal occult blood, imunochemical; Future -     Sedimentation rate -     ANA -     Rheumatoid factor -     C-reactive protein -     DG Chest 2 View; Future -     Vitamin B12 -     Amb ref to Medical Nutrition Therapy-MNT  GAD (generalized anxiety disorder) GAD-7 score is 2 however patient admits to being quite uncontrolled. Continue talk therapy. Will address again at next visit. Consider medications based on results of labs and patient's clinical symptoms.  Encounter for screening for other viral diseases -     Hepatitis C Antibody  Screening for HIV (human immunodeficiency virus) -     HIV Antibody (routine testing w rflx)   CMA or LPN served as scribe during this visit. History, Physical, and Plan performed by  medical provider. The above documentation has been reviewed and is accurate and complete.  Time spent with patient today was 65 minutes which consisted of chart review, discussing diagnosis, work up, treatment answering questions and documentation.   [name], PA-C [place], [place] 10/13/2020  Follow-up: No follow-ups on file.

## 2020-10-14 ENCOUNTER — Other Ambulatory Visit: Payer: Self-pay

## 2020-10-14 ENCOUNTER — Encounter: Payer: BC Managed Care – PPO | Attending: Physician Assistant | Admitting: Registered"

## 2020-10-14 ENCOUNTER — Encounter: Payer: Self-pay | Admitting: Registered"

## 2020-10-14 DIAGNOSIS — R634 Abnormal weight loss: Secondary | ICD-10-CM | POA: Diagnosis not present

## 2020-10-14 NOTE — Patient Instructions (Signed)
-   Aim to have 3 meals + 3 snacks a day eating about every 3 hours.   - Set alarms on phone to help with remembering to eat:  6:30 am - Breakfast   9:30 am - Morning snack (Carnation Instant Breakfast + whole milk)  12:30 - 1 pm - Lunch  3:30 - 4 pm - Afternoon snack  (Carnation Instant Breakfast + whole    milk)  6:30 - 7 pm - Dinner   9:30 - 10 pm - Evening snack  (Carnation Instant Breakfast + whole    Milk)  - Balance each meal with a source of fruit + vegetable + protein + dairy + starch/grain. Choose whole grains when possible.

## 2020-10-14 NOTE — Progress Notes (Signed)
Appointment start time: 1:58  Appointment end time: 2:56  Patient was seen on 10/14/20 for nutrition counseling pertaining to disordered eating  Primary care provider: [name] Therapist: N/A  ROI: N/A Any other medical team members: oncology   Assessment  Pt arrives states she has been struggling with weight since ovarian cancer diagnosis in 11/2019. States she wants to get the reigns back on weight loss. Reports she was initially having a lot of bloating and unsure if cause was poor dieting or from her cyst. States she has participated in Whole 30. Noticed oatmeal was causing a lot of bloating for her as well. Reports in 08/2020 she was weighing 116 lbs; which is one of her lowest weights since recovering from anorexia nervosa. States she was told her oncologist to track daily weight and food intake; triggering for her related to her previous eating disorder. Reports her eating disorder was health triggered. Initiated around age [age] as vegetarian starting college with limited food choices that honored her dietary preferences. States she was drinking Ensure Max Protein after realizing weight was 116 lbs. Noticed her weight stopped dropping but discontinued nutritional supplements because of expense.   Reports her recent weight for today was 123 lbs. States she has had muscle loss and increased fatigue.   States she does not typically drink milk. Will consume yogurt and cheese as dairy options. States she feels bloated after eating a lot of carbohydrates. States she and her husband will use vegetable noodles and zoodles instead of regular pasta options.  States sometimes she forgets to eat lunch due to time blindness.   Reports history of ADHD, currently not taking medication for it. Also reports anxiety and discussing medication options with PCP.   Growth Metrics: Goal weight range based on growth chart data: 130-140 Goal rate of weight gain:  0.5-1.0 lb/week  Eating history: Length  of time: AN not current; began at age [age] Previous treatments: none Goals for RD meetings: improve cold intolerance, shakiness in the morning  Weight history:  Highest weight: 140   Lowest weight: 116 (98 lbs At age 43 when AN was present) Most consistent weight: 120-122 What would you like to weigh: How has weight changed in the past year: weight loss  Medical Information:  Changes in hair, skin, nails since ED started: dry, brittle hair Chewing/swallowing difficulties: no Reflux or heartburn: no Trouble with teeth: no LMP without the use of hormones: 1/2  Weight at that point: 120-122 Constipation, diarrhea: no, once a day Dizziness/lightheadedness: no; will be shaky sometimes in the mornings Headaches/body aches: no Heart racing/chest pain: heart feels jolty sometimes Mood: anxious sometimes, overall good Sleep: great; sleeps 7-9 hrs/night Focus/concentration: ADHD Cold intolerance: yes Vision changes: some blurry vision; may need adjusting   Mental health diagnosis: previous AN   Allergies: shellfish Dietary assessment: A typical day consists of 3 meals and 0-3 snacks  Safe foods include: fruit, vegetables, nuts,   Avoided foods include: beef, pork, fish  24 hour recall:  B: 1 c hot lemon water + smoothie (greens, hemp protein, flaxseed mill, spirallina, fruit) S: sometimes PB toast  L: [place] wrap (sprouted tortilla, [place], vegetables) + apple S: sometimes fruit or pistachios D: salad + frozen pizza (margherita pizza) S: sometimes ice cream or tea or nothing Beverages: hot lemon water (8 oz), tea, smoothies, water (1*32 oz; 32 oz), seltzer water   Physical activity: walking 3 mi 3-5 days/week, yoga 20-30 min, 6-7 days/week  What Methods Do You Use To  Control Your Weight (Compensatory behaviors)?  None   Estimated energy intake: ~1000-1100 kcal  Estimated energy needs: 2000-2200 kcal 275-300 g CHO 100-110 g pro 67-73 g fat  Nutrition Diagnosis: NB-1.1  Food and nutrition-related knowledge deficit As related to lack of prior nutrition-related education.  As evidenced by verablizes incomplete information.  Intervention/Goals: Pt was educated and counseled on eating to nourish the body, signs/symptoms of not being adequately nourished, ways to increase nourishment, Rule of 3's, and meal planning. Discussed how to have 3 snacks a day and what to include. Discussed potentially feeling bloated, gastroparesis, abdominal distention, and feelings of fullness when increasing intake. Pt was in agreement with goals listed. Goals: - Aim to have 3 meals + 3 snacks a day eating about every 3 hours.  - Set alarms on phone to help with remembering to eat:  6:30 am - Breakfast   9:30 am - Morning snack (Carnation Instant Breakfast +  whole milk)  12:30 - 1 pm - Lunch  3:30 - 4 pm - Afternoon snack  (Carnation Instant  Breakfast + whole milk)  6:30 - 7 pm - Dinner   9:30 - 10 pm - Evening snack  (Carnation Instant  Breakfast + whole milk) - Balance each meal with a source of fruit + vegetable + protein + dairy + starch/grain. Choose whole grains when possible.    Meal plan:    3 meals    3 snacks  Monitoring and Evaluation: Patient will follow up prn after next PCP visit in 2-4 weeks.

## 2020-10-15 LAB — HIV ANTIBODY (ROUTINE TESTING W REFLEX): HIV 1&2 Ab, 4th Generation: NONREACTIVE

## 2020-10-15 LAB — RHEUMATOID FACTOR: Rheumatoid fact SerPl-aCnc: <14 IU/mL (ref <14)

## 2020-10-15 LAB — ANTI-NUCLEAR AB-TITER (ANA TITER): ANA Titer 1: 1:80 {titer} — ABNORMAL HIGH

## 2020-10-15 LAB — HEPATITIS C ANTIBODY
Hepatitis C Ab: NONREACTIVE
SIGNAL TO CUT-OFF: 0.01 (ref <1.00)

## 2020-10-15 LAB — ANA: Anti Nuclear Antibody (ANA): POSITIVE — AB

## 2020-10-17 ENCOUNTER — Other Ambulatory Visit: Payer: BC Managed Care – PPO

## 2020-10-17 ENCOUNTER — Other Ambulatory Visit (INDEPENDENT_AMBULATORY_CARE_PROVIDER_SITE_OTHER): Payer: BC Managed Care – PPO

## 2020-10-17 DIAGNOSIS — M2550 Pain in unspecified joint: Secondary | ICD-10-CM | POA: Diagnosis not present

## 2020-10-17 DIAGNOSIS — R5383 Other fatigue: Secondary | ICD-10-CM | POA: Diagnosis not present

## 2020-10-17 DIAGNOSIS — R634 Abnormal weight loss: Secondary | ICD-10-CM | POA: Diagnosis not present

## 2020-10-17 LAB — FECAL OCCULT BLOOD, IMMUNOCHEMICAL: Fecal Occult Bld: NEGATIVE

## 2020-10-20 ENCOUNTER — Encounter: Payer: Self-pay | Admitting: Physician Assistant

## 2020-10-20 DIAGNOSIS — R768 Other specified abnormal immunological findings in serum: Secondary | ICD-10-CM

## 2020-10-23 DIAGNOSIS — F4322 Adjustment disorder with anxiety: Secondary | ICD-10-CM | POA: Diagnosis not present

## 2020-10-28 DIAGNOSIS — N644 Mastodynia: Secondary | ICD-10-CM | POA: Diagnosis not present

## 2020-11-04 ENCOUNTER — Telehealth: Payer: Self-pay | Admitting: Genetic Counselor

## 2020-11-04 NOTE — Telephone Encounter (Signed)
Returned Kathryn Park's call regarding her recent referral to genetics by Dr. Marlou Starks. We reviewed our discussion from our genetic counseling appointment on 12/04/2019. Kathryn Park had also reviewed the note from this visit in [place] and does not have any follow-up questions. The only new family history information includes a maternal great-aunt diagnosed with breast cancer in her 70s. This does not change our recommendations regarding genetic testing. Kathryn Park expressed that her main concern at this time is learning about breast cancer risk and risk-reduction given her recent breast concerns.   We reviewed breast cancer risk models with her, including the Oleh Genin (see data inputs and calculations below). The Baker Janus model estimates her 5-year breast cancer risk to be 0.6%. We discussed that consideration for chemoprevention is recommended for women who have a 5-year breast cancer risk of 1.7% or higher.   Tyrer-Cuzick estimates her lifetime risk of developing breast cancer to be approximately 23.3%. This lifetime breast cancer risk is a preliminary estimate based on available information using one of several models endorsed by the [place] (ACS). The ACS recommends consideration of breast MRI screening as an adjunct to mammography for patients at high risk (defined as 20% or greater lifetime risk). This risk estimate can change over time and that this calculation may be repeated to reflect new information in her personal or family history in the future.  Based on an estimated lifetime breast cancer risk of 23.3%, Kathryn Park has been determined to be at high risk for breast cancer. Therefore, we discussed that it is reasonable for her to be followed by a high-risk breast cancer clinic. She is interested and this referral will be placed.    Tyrer-Cuzick:    Baker Janus Model:

## 2020-11-05 ENCOUNTER — Encounter: Payer: Self-pay | Admitting: Physician Assistant

## 2020-11-05 ENCOUNTER — Other Ambulatory Visit: Payer: Self-pay | Admitting: Physician Assistant

## 2020-11-05 ENCOUNTER — Encounter: Payer: Self-pay | Admitting: Gynecologic Oncology

## 2020-11-05 ENCOUNTER — Other Ambulatory Visit: Payer: Self-pay

## 2020-11-05 ENCOUNTER — Ambulatory Visit (INDEPENDENT_AMBULATORY_CARE_PROVIDER_SITE_OTHER): Payer: BC Managed Care – PPO | Admitting: Physician Assistant

## 2020-11-05 ENCOUNTER — Ambulatory Visit (HOSPITAL_COMMUNITY)
Admission: RE | Admit: 2020-11-05 | Discharge: 2020-11-05 | Disposition: A | Discharge status: Home / Self Care | Payer: BC Managed Care – PPO | Source: Ambulatory Visit | Attending: Physician Assistant | Admitting: Physician Assistant

## 2020-11-05 VITALS — BP 120/70 | HR 96 | Temp 99.4°F | Ht 66.5 in | Wt 127.0 lb

## 2020-11-05 DIAGNOSIS — R591 Generalized enlarged lymph nodes: Secondary | ICD-10-CM

## 2020-11-05 DIAGNOSIS — R599 Enlarged lymph nodes, unspecified: Secondary | ICD-10-CM | POA: Diagnosis not present

## 2020-11-05 DIAGNOSIS — R634 Abnormal weight loss: Secondary | ICD-10-CM | POA: Diagnosis not present

## 2020-11-05 DIAGNOSIS — F411 Generalized anxiety disorder: Secondary | ICD-10-CM | POA: Diagnosis not present

## 2020-11-05 MED ORDER — BUSPIRONE HCL 5 MG PO TABS: 5.0000 mg | ORAL_TABLET | Freq: Three times a day (TID) | ORAL | 1 refills | Status: DC | PRN

## 2020-11-05 NOTE — Patient Instructions (Signed)
It was great to see you!  Start buspar 5 mg three times daily as needed to see if this helps with your anxiety.  I will reach out to Dr. Berline Lopes and her nurse about your lymphnode to see what they recommend. If you do not hear from me or them in a week regarding this, please message me!  Let's follow-up in 1 month to see how buspar is going and we can talk about adding in low dose ADHD medication to help with work performance if needed.  Take care,  [name] PA-C

## 2020-11-05 NOTE — Progress Notes (Signed)
Kathryn Park is a 35 y.o. female is here for follow up.  I acted as a Education administrator for [employer], PA-C Anselmo Pickler, LPN   History of Present Illness:   Chief Complaint  Patient presents with  . Weight Loss  . Fatigue  . Anxiety    HPI    Unintentional weight loss Pt is following up today, saw dietitian 3 weeks ago and has been following the diet she was given, going good. Has set alarms on her phone to do snacks 3 times daily, this is helping.  Wt Readings from Last 3 Encounters:  11/05/20 127 lb (57.6 kg)  10/13/20 129 lb (58.5 kg)  09/04/20 129 lb (58.5 kg)    Anxiety, Situational Pt has been seeing therapist and has said that her anxiety seems very situational. If she has doctor appt, awaiting lab results she gets anxious and it can take her several days until she can feel better/more relaxed. 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 Has tried Zoloft and Prozac in the past. Prozac was working at one point and then no longer became effective. Zoloft was possibly too sedating.   ADHD Uncontrolled. She reports that she was on a very low dose of adderall at one point and this was effective. She is hesitant about starting meds due to appetite suppression but she is also wanting better relief of her ADHD.    LAD Has R groin LAD that has been persistent x at least 6 months. At one point it was thought to be related to ingrown hair. It was tender and larger at one point but she is concerned because it has persisted.    There are no preventive care reminders to display for this patient.  Past Medical History:  Diagnosis Date  . Abnormal Pap smear of cervix   . Allergy   . Anemia    suspected poor diet; vegetarian during teens  . Anxiety   . Asthma    childhood  . Cancer ([place])   . Depression   . Dysmenorrhea   . Family history of breast cancer   . Family history of kidney cancer    . History of iron deficiency anemia    age 71  . Joint pain   . Leukocytosis   . Low vitamin D level   . Migraine with aura    never on rx or saw neurology  . Ovarian cyst    Left  . Pneumonia 01/2012    left lower lobe  . Pre-diabetes 2015   had a "borderline" A1c  . STD (sexually transmitted disease)    HSV1  . Wears contact lenses   . Wears glasses      Social History   Tobacco Use  . Smoking status: Former Smoker    Packs/day: 1.00    Types: Cigarettes  . Smokeless tobacco: Never Used  Vaping Use  . Vaping Use: Never used  Substance Use Topics  . Alcohol use: Not Currently  . Drug use: No    Past Surgical History:  Procedure Laterality Date  . BREAST BIOPSY Right 04/2020   benign  . COLPOSCOPY    . LAPAROSCOPIC OVARIAN CYSTECTOMY Left 11/13/2019   Procedure: LAPAROSCOPIC OVARIAN CYSTECTOMY/POSSIBLE LEFT OOPHORECTOMY/ COLLECTION OF PELVIC WASHINGS/ks;  Surgeon: Nunzio Cobbs, MD;  Location: [facility];  Service: Gynecology;  Laterality: Left;  Possible left oophorectomy Colection of pelvic  washings To follow first case of the day  . LEFT OOPHORECTOMY Left 11/2019  . TONSILLECTOMY    . UPPER GI ENDOSCOPY      Family History  Problem Relation Age of Onset  . Diabetes Mother   . Hypertension Mother   . Stroke Mother        TIA  . Factor V Leiden deficiency Mother   . Other Mother        pituitary tumor  . Crohn's disease Father   . Kidney cancer Father        cancerous tumor of kidney  . Breast cancer Maternal Grandmother        dx. 20s  . Stroke Maternal Grandfather   . Other Brother        ear tumor, dx. age 74-6  . Rheum arthritis Maternal Great-grandmother   . Cancer Other   . Ovarian cancer Neg Hx   . Endometrial cancer Neg Hx   . Colon cancer Neg Hx     PMHx, SurgHx, SocialHx, FamHx, Medications, and Allergies were reviewed in the Visit Navigator and updated as appropriate.   Patient Active Problem List    Diagnosis Date Noted  . Bladder retention 10/13/2020  . HSV-1 (herpes simplex virus 1) infection 10/13/2020  . History of anorexia nervosa 10/13/2020  . GAD (generalized anxiety disorder) 10/13/2020  . Family history of breast cancer   . Family history of kidney cancer   . 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 Smoking status: Former Smoker    Packs/day: 1.00    Types: Cigarettes  . Smokeless tobacco: Never Used  Vaping Use  . Vaping Use: Never used  Substance Use Topics  . Alcohol use: Not Currently  . Drug use: No    Current Medications and Allergies:    Current Outpatient Medications:  .  busPIRone (BUSPAR) 5 MG tablet, Take 1 tablet (5 mg total) by mouth 3 (three) times daily as needed (anxiety)., Disp: 90 tablet, Rfl: 1 .  Cholecalciferol (VITAMIN D-3) 25 MCG (1000 UT) CAPS, Take by mouth. 50 mcg, Disp: , Rfl:  .  Fexofenadine-Pseudoephedrine (ALLEGRA-D 24 HOUR PO), Take 1 tablet by mouth as needed., Disp: , Rfl:  .  Multiple Vitamins-Minerals (MULTIPLE VITAMINS/WOMENS PO), Take 1 tablet by mouth daily in the afternoon., Disp: , Rfl:  .  valACYclovir (VALTREX) 1000 MG tablet, Take 2 tablets (2000 mg) by mouth twice a day for 24 hours. (Patient taking differently: as needed. Take 2 tablets (2000 mg) by mouth twice a day for 24 hours as needed for cold sore), Disp: 30 tablet, Rfl: 1   Allergies  Allergen Reactions  . Shellfish Allergy Anaphylaxis    Throat closes  . Other Other (See Comments)    Watermelon, mold/mildew, pet dander, dust and dust mites  Swelling of eyes and congestion  . Latex Rash  . Sulfa Antibiotics Rash    Review of Systems   ROS  Negative unless otherwise specified per HPI.  Vitals:   Vitals:   11/05/20 1039  BP: 120/70  Pulse: 96  Temp: 99.4 F (37.4 C)  TempSrc: Temporal  SpO2: 97%  Weight: 127 lb (57.6 kg)  Height: 5' 6.5" (1.689 m)     Body mass index is 20.19 kg/m.   Physical  Exam:    Physical Exam Vitals and nursing note reviewed.  Constitutional:      General: She is not in acute distress.  Appearance: She is well-developed. She is not ill-appearing, toxic-appearing or sickly-appearing.  Cardiovascular:     Rate and Rhythm: Normal rate and regular rhythm.     Pulses: Normal pulses.     Heart sounds: Normal heart sounds, S1 normal and S2 normal.     Comments: No LE edema Pulmonary:     Effort: Pulmonary effort is normal.     Breath sounds: Normal breath sounds.  Lymphadenopathy:     Lower Body: Right inguinal adenopathy present.  Skin:    General: Skin is warm, dry and intact.  Neurological:     Mental Status: She is alert.     GCS: GCS eye subscore is 4. GCS verbal subscore is 5. GCS motor subscore is 6.  Psychiatric:        Mood and Affect: Mood and affect normal.        Speech: Speech normal.        Behavior: Behavior normal. Behavior is cooperative.      Assessment and Plan:    Kathryn Park was seen today for weight loss, fatigue and anxiety.  Diagnoses and all orders for this visit:  Unintentional weight loss Weight down 2 lb since I saw her a month ago, however she feels improved now that she has a plan with the RD. I'm hoping improvement of anxiety and ADHD will help with symptoms and she can work on weight maintenance. Consider remeron if needed in future. Follow-up in 1 month, sooner if concern.  GAD (generalized anxiety disorder) Improved but uncontrolled. Start buspar 5 mg TID prn. Follow-up in 1 month, sooner if concerns.  ADHD Uncontrolled. I am hesitant to start meds at this time since we are starting buspar. Trial buspar for anxiety and follow-up in 1 month, consider low dose stimulant.  Lymphadenopathy Prominent R inguinal lymph node on my exam. This is causing patient significant anxiety. I'm going to reach out to her oncologist to see what they recommend regarding next steps.  Other orders -     busPIRone (BUSPAR)  5 MG tablet; Take 1 tablet (5 mg total) by mouth 3 (three) times daily as needed (anxiety).  CMA or LPN served as scribe during this visit. History, Physical, and Plan performed by medical provider. The above documentation has been reviewed and is accurate and complete.   [name], PA-C [place], [place] 11/05/2020  Follow-up: No follow-ups on file.

## 2020-11-06 ENCOUNTER — Telehealth: Payer: Self-pay | Admitting: Hematology and Oncology

## 2020-11-06 NOTE — Telephone Encounter (Signed)
Received a referral from [facility] for Ms. Kathryn Park to be seen in the high risk breast clinic. Pt has been cld and scheduled to see Dr. Chryl Heck on 2/14 at 8am.

## 2020-11-17 ENCOUNTER — Inpatient Hospital Stay: Payer: BC Managed Care – PPO | Attending: Gynecologic Oncology | Admitting: Hematology and Oncology

## 2020-11-17 ENCOUNTER — Encounter: Payer: Self-pay | Admitting: Hematology and Oncology

## 2020-11-17 ENCOUNTER — Other Ambulatory Visit: Payer: Self-pay

## 2020-11-17 ENCOUNTER — Telehealth: Payer: Self-pay | Admitting: Hematology and Oncology

## 2020-11-17 VITALS — BP 140/81 | HR 89 | Temp 97.9°F | Resp 15 | Ht 66.5 in | Wt 128.9 lb

## 2020-11-17 DIAGNOSIS — Z1501 Genetic susceptibility to malignant neoplasm of breast: Secondary | ICD-10-CM | POA: Insufficient documentation

## 2020-11-17 DIAGNOSIS — Z90721 Acquired absence of ovaries, unilateral: Secondary | ICD-10-CM | POA: Diagnosis not present

## 2020-11-17 DIAGNOSIS — Z8543 Personal history of malignant neoplasm of ovary: Secondary | ICD-10-CM | POA: Insufficient documentation

## 2020-11-17 DIAGNOSIS — Z803 Family history of malignant neoplasm of breast: Secondary | ICD-10-CM | POA: Diagnosis not present

## 2020-11-17 DIAGNOSIS — Z809 Family history of malignant neoplasm, unspecified: Secondary | ICD-10-CM | POA: Insufficient documentation

## 2020-11-17 DIAGNOSIS — Z87891 Personal history of nicotine dependence: Secondary | ICD-10-CM | POA: Insufficient documentation

## 2020-11-17 NOTE — Telephone Encounter (Signed)
Scheduled per 02/14 los, patient will be updated per my chart. °

## 2020-11-17 NOTE — Progress Notes (Signed)
[facility] CONSULT NOTE  Patient Care Team: [name], [place] as PCP - General (Physician Assistant) Lafonda Mosses, MD as Consulting Physician (Gynecologic Oncology)  CHIEF COMPLAINTS/PURPOSE OF CONSULTATION:   High risk for breast cancer.  ASSESSMENT & PLAN:  No problem-specific Assessment & Plan notes found for this encounter.  Orders Placed This Encounter  Procedures  . MM DIAG BREAST TOMO BILATERAL    Standing Status:   Future    Standing Expiration Date:   11/17/2021    Order Specific Question:   Reason for Exam (SYMPTOM  OR DIAGNOSIS REQUIRED)    Answer:   Screening mammo alternating with abbreviated MRI, life time risk of breast cancer of 23%    Order Specific Question:   Is the patient pregnant?    Answer:   No    Order Specific Question:   Preferred imaging location?    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  As reviewed below, her 5-year risk of breast cancer by Gail's model is 0.6% and hence she does not qualify for tamoxifen prevention. Her lifetime risk of breast cancer per Tyer Cusick model is 23% and hence I agree with MRI annually alternating with mammogram for breast cancer screening.  I discussed that long-term risks of gadolinium deposition in the brain are not completely known at this time.  She agrees to proceed with annual MRIs knowing the above-mentioned risks. Last MRI is in December 2021 which was unremarkable. I have ordered a repeat mammogram in June 2022 Physical examination today without any overt concerns for breast cancer.  There is some density on right wrist which has been evaluated by imaging before.  No pathological lymphadenopathy palpable.  He also discussed lifestyle changes including maintaining a healthy weight, healthy diet, regular exercise, decreasing  alcohol intake.  We have in general discussed the risk factors for breast cancer including family history, early age of menarche, late menopause at age, nulliparity , smoking, alcohol and BMI. She will continue to FU with [place] every 6 months. No FH concerning for presence of BRCA or other high penetrance genes.    HISTORY OF PRESENTING ILLNESS:   Kathryn Park 35 y.o. female is here to discuss recommendations about high risk breast cancer.  This is a very pleasant 35 year old healthy female patient with past medical history significant for granulosa tumor of the left ovary  s/p laparoscopic oophorectomy, history of breast biopsies referred to high risk breast cancer clinic given her increased risk of breast cancer.  Kathryn Park arrived to the point today by herself.   She had a recent MRI in December 2021 without any obvious findings, prior to that had multiple mammograms and ultrasounds. Her right breast needle biopsy in July 2021 showed fibroadenoma. Imaging of all suspicious areas confirmed to be benign findings. She complains of breast tenderness in her right breast with menstrual cycles. Rest of the pertinent 10 point ROS reviewed and negative  REVIEW OF SYSTEMS:   Constitutional: Denies fevers, chills or abnormal night sweats Eyes: Denies blurriness of vision, double vision or watery eyes Ears, nose, mouth, throat, and face: Denies mucositis or sore throat Respiratory: Denies cough, dyspnea or wheezes Cardiovascular: Denies palpitation, chest discomfort or lower extremity swelling Gastrointestinal:  Denies nausea, heartburn or change in bowel habits Skin: Denies abnormal skin rashes Lymphatics: Denies new lymphadenopathy or  easy bruising Neurological:Denies numbness, tingling or new weaknesses Behavioral/Psych: Mood is stable, no new changes  All other systems were reviewed with the patient and are negative.  MEDICAL HISTORY:  Past Medical History:  Diagnosis  Date  . Abnormal Pap smear of cervix   . Allergy   . Anemia    suspected poor diet; vegetarian during teens  . Anxiety   . Asthma    childhood  . Cancer ([place])   . Depression   . Dysmenorrhea   . Family history of breast cancer   . Family history of kidney cancer   . History of iron deficiency anemia    age 71  . Joint pain   . Leukocytosis   . Low vitamin D level   . Migraine with aura    never on rx or saw neurology  . Ovarian cyst    Left  . Pneumonia 01/2012    left lower lobe  . Pre-diabetes 2015   had a "borderline" A1c  . STD (sexually transmitted disease)    HSV1  . Wears contact lenses   . Wears glasses     SURGICAL HISTORY: Past Surgical History:  Procedure Laterality Date  . BREAST BIOPSY Right 04/2020   benign  . COLPOSCOPY    . LAPAROSCOPIC OVARIAN CYSTECTOMY Left 11/13/2019   Procedure: LAPAROSCOPIC OVARIAN CYSTECTOMY/POSSIBLE LEFT OOPHORECTOMY/ COLLECTION OF PELVIC WASHINGS/ks;  Surgeon: Nunzio Cobbs, MD;  Location: [facility];  Service: Gynecology;  Laterality: Left;  Possible left oophorectomy Colection of pelvic washings To follow first case of the day  . LEFT OOPHORECTOMY Left 11/2019  . TONSILLECTOMY    . UPPER GI ENDOSCOPY      SOCIAL HISTORY: Social History   Socioeconomic History  . Marital status: Married    Spouse name: Not on file  . Number of children: Not on file  . Years of education: Not on file  . Highest education level: Not on file  Occupational History  . Not on file  Tobacco Use  . Smoking status: Former Smoker    Packs/day: 1.00    Types: Cigarettes  . Smokeless tobacco: Never Used  Vaping Use  . Vaping Use: Never used  Substance and Sexual Activity  . Alcohol use: Not Currently  . Drug use: No  . Sexual activity: Yes    Partners: Male    Birth control/protection: Condom    Comment: everytime  Other Topics Concern  . Not on file  Social History Narrative   [place]oved [place]   Married   Social Determinants of Health   Financial Resource Strain: Not on file  Food Insecurity: Not on file  Transportation Needs: Not on file  Physical Activity: Not on file  Stress: Not on file  Social Connections: Not on file  Intimate Partner Violence: Not on file    FAMILY HISTORY: Family History  Problem Relation Age of Onset  . Diabetes Mother   . Hypertension Mother   . Stroke Mother        TIA  . Factor V Leiden deficiency Mother   . Other Mother        pituitary tumor  . Crohn's disease Father   . Kidney cancer Father        cancerous tumor of kidney  . Breast cancer Maternal Grandmother        dx. 46s  . Stroke Maternal Grandfather   . Other Brother  ear tumor, dx. age 4-6  . Rheum arthritis Maternal Great-grandmother   . Cancer Other   . Ovarian cancer Neg Hx   . Endometrial cancer Neg Hx   . Colon cancer Neg Hx     ALLERGIES:  is allergic to shellfish allergy, other, latex, and sulfa antibiotics.  MEDICATIONS:  Current Outpatient Medications  Medication Sig Dispense Refill  . busPIRone (BUSPAR) 5 MG tablet Take 1 tablet (5 mg total) by mouth 3 (three) times daily as needed (anxiety). 90 tablet 1  . Cholecalciferol (VITAMIN D-3) 25 MCG (1000 UT) CAPS Take by mouth. 50 mcg    . Fexofenadine-Pseudoephedrine (ALLEGRA-D 24 HOUR PO) Take 1 tablet by mouth as needed.    . Multiple Vitamins-Minerals (MULTIPLE VITAMINS/WOMENS PO) Take 1 tablet by mouth daily in the afternoon.    . valACYclovir (VALTREX) 1000 MG tablet Take 2 tablets (2000 mg) by mouth twice a day for 24 hours. (Patient taking differently: as needed. Take 2 tablets (2000 mg) by mouth twice a day for 24 hours as needed for cold sore) 30 tablet 1   No current facility-administered medications for this visit.   We reviewed breast cancer risk models with her, including the Oleh Genin (see data inputs and calculations below). The Baker Janus model  estimates her 5-year breast cancer risk to be 0.6%. We discussed that consideration for chemoprevention is recommended for women who have a 5-year breast cancer risk of 1.7% or higher.   Tyrer-Cuzick estimates her lifetime risk of developing breast cancer to be approximately 23.3%. This lifetime breast cancer risk is a preliminary estimate based on available information using one of several models endorsed by the [place] (ACS). The ACS recommends consideration of breast MRI screening as an adjunct to mammography for patients at high risk (defined as 20% or greater lifetime risk). This risk estimate can change over time and that this calculation may be repeated to reflect new information in her personal or family history in the future.  Based on an estimated lifetime breast cancer risk of 23.3%, Kathryn Park has been determined to be at high risk for breast cancer.Therefore, we discussed that it is reasonable for her to be followed by a high-risk breast cancer clinic.  Gail Model:    PHYSICAL EXAMINATION:  ECOG PERFORMANCE STATUS: 0 - Asymptomatic  Vitals:   11/17/20 0814  BP: 140/81  Pulse: 89  Resp: 15  Temp: 97.9 F (36.6 C)  SpO2: 100%   Filed Weights   11/17/20 0814  Weight: 128 lb 14.4 oz (58.5 kg)    GENERAL:alert, no distress and comfortable SKIN: skin color, texture, turgor are normal, no rashes or significant lesions EYES: normal, conjunctiva are pink and non-injected, sclera clear OROPHARYNX:no exudate, no erythema and lips, buccal mucosa, and tongue normal  NECK: supple, thyroid normal size, non-tender, without nodularity LYMPH:  no palpable lymphadenopathy in the cervical, axillary or inguinal LUNGS: clear to auscultation and percussion with normal breathing effort HEART: regular rate & rhythm and no murmurs and no lower extremity edema ABDOMEN:abdomen soft, non-tender and normal bowel sounds Musculoskeletal:no cyanosis of digits and no clubbing   PSYCH: alert & oriented x 3 with fluent speech NEURO: no focal motor/sensory deficits Breast: Bilateral breasts examined. Right breast appears larger than left breast. There is an area of increased density on palpation in the right breast upper outer quadrant which has been evaluated according to patient. No pathologic regional lymphadenopathy  LABORATORY DATA:  I have reviewed the data as  listed Lab Results  Component Value Date   WBC 7.8 10/13/2020   HGB 13.6 10/13/2020   HCT 40.5 10/13/2020   MCV 87.4 10/13/2020   PLT 218.0 10/13/2020     Chemistry      Component Value Date/Time   NA 138 10/13/2020 1415   NA 139 10/11/2019 1013   K 3.7 10/13/2020 1415   CL 103 10/13/2020 1415   CO2 31 10/13/2020 1415   BUN 10 10/13/2020 1415   BUN 12 10/11/2019 1013   CREATININE 0.70 10/13/2020 1415   CREATININE 0.75 09/04/2020 1150      Component Value Date/Time   CALCIUM 9.8 10/13/2020 1415   ALKPHOS 47 10/13/2020 1415   AST 13 10/13/2020 1415   AST 11 (L) 09/04/2020 1150   ALT 12 10/13/2020 1415   ALT 8 09/04/2020 1150   BILITOT 0.4 10/13/2020 1415   BILITOT 0.3 09/04/2020 1150       RADIOGRAPHIC STUDIES: I have personally reviewed the radiological images as listed and agreed with the findings in the report. [place] RT LOWER EXTREM LTD SOFT TISSUE NON VASCULAR  Result Date: 11/05/2020 CLINICAL DATA:  Right groin lymphadenopathy for 6 months. History of left ovarian cancer. EXAM: ULTRASOUND OF right GROIN SOFT TISSUES TECHNIQUE: Ultrasound examination of the groin soft tissues was performed in the area of clinical concern. COMPARISON:  Abdominopelvic CT 11/29/2019 FINDINGS: Targeted sonographic evaluation of the right groin. There are 2 adjacent prominent lymph nodes in the right groin, 1.3 x 0.4 x 0.9 cm, 1.8 x 0.4 x 1.0 cm. These lymph nodes demonstrate normal renal form morphology without rounded contours, normal fatty hila. There is no evidence of hyperemia. No fluid collection or  extra nodal mass. IMPRESSION: Two prominent lymph nodes in the right groin that have benign sonographic imaging features, short axis dimension of both of these nodes is 4 mm. Recommend continued clinical follow-up. Compared to prior CT, there may be slight increase in size, however comparison is limited due to differences in modality. Electronically Signed   By: Keith Rake M.D.   On: 11/05/2020 15:50   All questions were answered. The patient knows to call the clinic with any problems, questions or concerns.  I spent 45 minutes in the care of this patient including H and P, review of records, counseling and coordination of care.     Benay Pike, MD 11/17/2020 9:44 AM

## 2020-11-20 ENCOUNTER — Other Ambulatory Visit: Payer: Self-pay | Admitting: Hematology and Oncology

## 2020-11-20 DIAGNOSIS — Z803 Family history of malignant neoplasm of breast: Secondary | ICD-10-CM

## 2020-11-24 NOTE — Progress Notes (Signed)
Office Visit Note  Patient: Kathryn Park             Date of Birth: 1986/04/29           MRN: [id number]             PCP: [name], PA Referring: [name], PA Visit Date: 11/25/2020   Subjective:  New Patient (Initial Visit) (Patient complains of bilateral hip pain and popping sensation as well as bilateral hand and wrist stiffness and pain. )   History of Present Illness: Kathryn Park is a 35 y.o. female with history of granulosa cell tumor of left ovary here for evaluation of fatigue, arthralgias, and weight loss with positive ANA.  She noticed significant pain in the hands and hips bilaterally since at least 2019.  She had noticed swelling and stiffness in the left third finger when symptoms were starting initially symptoms have been persistent on and off since then usually without large amount of swelling.  The hip pain is also chronic and accompanied by frequent popping sensation even with just regular movements.  She says further evaluation of this has been neglected on account of needing treatment for her left sided ovarian cancer now status post oophorectomy.  She has been doing yoga and take ibuprofen as needed for symptoms which are somewhat helpful.  Symptom severity varies from day-to-day with a few very bad days per month.  She has history of migraine headaches but only about once per year with associated aura.  Feels her sleep is usually uninterrupted and restful except some nights wakes up unable to return to sleep. No chronic diarrhea or constipation issues.  Labs reviewed 10/2020 ANA 1:80 speckled CBC wnl CMP wnl UA neg ESR 6 RF neg CRP <1 HIV neg HCV neg B12 wnl FOB neg  09/2020 TSH wnl  Activities of Daily Living:  Patient reports morning stiffness for a few minutes.   Patient Reports nocturnal pain.  Difficulty dressing/grooming: Denies Difficulty climbing stairs: Denies Difficulty getting out of chair:  Denies Difficulty using hands for taps, buttons, cutlery, and/or writing: Reports  Review of Systems  Constitutional: Positive for fatigue.  HENT: Positive for mouth sores and mouth dryness. Negative for nose dryness.   Eyes: Positive for photophobia. Negative for pain, itching, visual disturbance and dryness.  Respiratory: Positive for shortness of breath. Negative for cough, hemoptysis and difficulty breathing.   Cardiovascular: Negative for chest pain, palpitations and swelling in legs/feet.  Gastrointestinal: Negative for abdominal pain, blood in stool, constipation and diarrhea.  Endocrine: Negative for increased urination.  Genitourinary: Negative for painful urination.  Musculoskeletal: Positive for arthralgias, joint pain, joint swelling and morning stiffness. Negative for myalgias, muscle weakness, muscle tenderness and myalgias.  Skin: Positive for rash. Negative for color change and redness.  Allergic/Immunologic: Positive for susceptible to infections.  Neurological: Positive for weakness. Negative for dizziness, numbness, headaches and memory loss.  Hematological: Positive for swollen glands.  Psychiatric/Behavioral: Negative for confusion and sleep disturbance.    PMFS History:  Patient Active Problem List   Diagnosis Date Noted   Bilateral hip pain 11/25/2020   Positive ANA (antinuclear antibody) 11/25/2020   Other fatigue 11/25/2020   Bladder retention 10/13/2020   HSV-1 (herpes simplex virus 1) infection 10/13/2020   History of anorexia nervosa 10/13/2020   GAD (generalized anxiety disorder) 10/13/2020   Family history of breast cancer    Family history of kidney cancer    Granulosa cell tumor of left ovary  11/22/2019    Past Medical History:  Diagnosis Date   Abnormal Pap smear of cervix    Allergy    Anemia    suspected poor diet; vegetarian during teens   Anxiety    Asthma    childhood   Cancer ([place])    Depression    Dysmenorrhea     Family history of breast cancer    Family history of kidney cancer    History of iron deficiency anemia    age 48   Joint pain    Leukocytosis    Low vitamin D level    Migraine with aura    never on rx or saw neurology   Ovarian cyst    Left   Pneumonia 01/2012    left lower lobe   Pre-diabetes 2015   had a "borderline" A1c   STD (sexually transmitted disease)    HSV1   Wears contact lenses    Wears glasses     Family History  Problem Relation Age of Onset   Diabetes Mother    Hypertension Mother    Stroke Mother        TIA   Factor V Leiden deficiency Mother    Other Mother        pituitary tumor   Crohn's disease Father    Kidney cancer Father        cancerous tumor of kidney   Liver disease Father    Breast cancer Maternal Grandmother        dx. 72s   Stroke Maternal Grandfather    Other Brother        ear tumor, dx. age 38-6   Rheum arthritis Maternal Great-grandmother    Cancer Other    Ovarian cancer Neg Hx    Endometrial cancer Neg Hx    Colon cancer Neg Hx    Past Surgical History:  Procedure Laterality Date   BREAST BIOPSY Right 04/2020   benign   COLPOSCOPY     LAPAROSCOPIC OVARIAN CYSTECTOMY Left 11/13/2019   Procedure: LAPAROSCOPIC OVARIAN CYSTECTOMY/POSSIBLE LEFT OOPHORECTOMY/ COLLECTION OF PELVIC WASHINGS/ks;  Surgeon: Nunzio Cobbs, MD;  Location: [facility];  Service: Gynecology;  Laterality: Left;  Possible left oophorectomy Colection of pelvic washings To follow first case of the day   LEFT OOPHORECTOMY Left 11/2019   TONSILLECTOMY     UPPER GI ENDOSCOPY     Social History   Social History Narrative   Moved from [place]   Married   Immunization History  Administered Date(s) Administered   Influenza,inj,Quad PF,6+ Mos 07/09/2020   Moderna Sars-Covid-2 Vaccination 12/12/2019, 01/08/2020, 09/08/2020   Tdap 10/05/2015      Objective: Vital Signs: BP 114/77 (BP Location: Right Arm, Patient Position: Sitting, Cuff Size: Normal)    Pulse 66    Ht 5' 7.5" (1.715 m)    Wt 130 lb 6.4 oz (59.1 kg)    LMP 11/01/2020    BMI 20.12 kg/m    Physical Exam HENT:     Right Ear: External ear normal.     Left Ear: External ear normal.     Mouth/Throat:     Mouth: Mucous membranes are moist.     Pharynx: Oropharynx is clear.  Eyes:     Conjunctiva/sclera: Conjunctivae normal.  Cardiovascular:     Rate and Rhythm: Normal rate and regular rhythm.  Pulmonary:     Effort: Pulmonary effort is normal.  Breath sounds: Normal breath sounds.  Skin:    General: Skin is warm and dry.     Findings: No rash.     Comments: Normal nailfold capillaroscopy Mildly erythematous dry skin at MCP joints and distally  Neurological:     General: No focal deficit present.     Mental Status: She is alert.  Psychiatric:        Mood and Affect: Mood normal.     Musculoskeletal Exam:  Neck full ROM no tenderness Shoulders full ROM no tenderness or swelling Elbows full ROM no tenderness or swelling Wrists full ROM no tenderness or swelling Fingers full ROM no tenderness or swelling Some bilateral paraspinal muscle tenderness extending laterally around iliac crest Hip normal internal and external rotation, posterior pain provoked with pace maneuver, tenderness to palpation of greater trochanter Patient demonstrates clicking or snapping sound with hip flexion extension while in partially internally rotated position Knees full ROM no tenderness or swelling MTP no squeeze tenderness   Investigation: No additional findings.  Imaging: [place] RT LOWER EXTREM LTD SOFT TISSUE NON VASCULAR  Result Date: 11/05/2020 CLINICAL DATA:  Right groin lymphadenopathy for 6 months. History of left ovarian cancer. EXAM: ULTRASOUND OF right GROIN SOFT TISSUES TECHNIQUE: Ultrasound examination of the groin soft tissues was performed in the area of  clinical concern. COMPARISON:  Abdominopelvic CT 11/29/2019 FINDINGS: Targeted sonographic evaluation of the right groin. There are 2 adjacent prominent lymph nodes in the right groin, 1.3 x 0.4 x 0.9 cm, 1.8 x 0.4 x 1.0 cm. These lymph nodes demonstrate normal renal form morphology without rounded contours, normal fatty hila. There is no evidence of hyperemia. No fluid collection or extra nodal mass. IMPRESSION: Two prominent lymph nodes in the right groin that have benign sonographic imaging features, short axis dimension of both of these nodes is 4 mm. Recommend continued clinical follow-up. Compared to prior CT, there may be slight increase in size, however comparison is limited due to differences in modality. Electronically Signed   By: Keith Rake M.D.   On: 11/05/2020 15:50    Recent Labs: Lab Results  Component Value Date   WBC 7.8 10/13/2020   HGB 13.6 10/13/2020   PLT 218.0 10/13/2020   NA 138 10/13/2020   K 3.7 10/13/2020   CL 103 10/13/2020   CO2 31 10/13/2020   GLUCOSE 89 10/13/2020   BUN 10 10/13/2020   CREATININE 0.70 10/13/2020   BILITOT 0.4 10/13/2020   ALKPHOS 47 10/13/2020   AST 13 10/13/2020   ALT 12 10/13/2020   PROT 7.6 10/13/2020   ALBUMIN 4.9 10/13/2020   CALCIUM 9.8 10/13/2020   GFRAA 106 10/11/2019    Speciality Comments: No specialty comments available.  Procedures:  No procedures performed Allergies: Shellfish allergy, Other, Latex, and Sulfa antibiotics   Assessment / Plan:     Visit Diagnoses: Positive ANA (antinuclear antibody) - Plan: RNP Antibody, Anti-Smith antibody, Sjogrens syndrome-A extractable nuclear antibody, Sjogrens syndrome-B extractable nuclear antibody, Anti-DNA antibody, double-stranded, C3 and C4  Positive ANA without obvious inflammatory changes seen on exam or by history today.  We will check more specific ENA tests including RNP, Smith, SSA, SSB, dsDNA, and complements today.  Relatively low 1:80 titer combined with  nonspecific findings may not be a significant result.  If negative given the previous work-up as well low suspicious for underlying systemic disease and would also consider possibility of myofascial pain syndrome.  Bilateral hip pain  Hip pain is not severe no effect on  strength or range of motion.  Hip snapping with movement there is no significant abnormality on pelvic CT scan personally reviewed from last year so does not seem like femoral acetabular impingement or hip osteoarthritis.  Question tendinitis process, could consider evaluation with sports medicine clinic if becoming more problematic.  Other fatigue  Excessive fatigue exam review of systems not strongly suggestive for OSA and no medications other diagnoses to indicate central apnea.  History of anemia but currently normal CBC.  Orders: Orders Placed This Encounter  Procedures   RNP Antibody   Anti-Smith antibody   Sjogrens syndrome-A extractable nuclear antibody   Sjogrens syndrome-B extractable nuclear antibody   Anti-DNA antibody, double-stranded   C3 and C4   No orders of the defined types were placed in this encounter.   Follow-Up Instructions: No follow-ups on file.   Collier Salina, MD  Note - This record has been created using [employer].  Chart creation errors have been sought, but may not always  have been located. Such creation errors do not reflect on  the standard of medical care.

## 2020-11-25 ENCOUNTER — Ambulatory Visit: Payer: BC Managed Care – PPO | Admitting: Internal Medicine

## 2020-11-25 ENCOUNTER — Encounter: Payer: Self-pay | Admitting: Internal Medicine

## 2020-11-25 ENCOUNTER — Other Ambulatory Visit: Payer: Self-pay

## 2020-11-25 VITALS — BP 114/77 | HR 66 | Ht 67.5 in | Wt 130.4 lb

## 2020-11-25 DIAGNOSIS — R768 Other specified abnormal immunological findings in serum: Secondary | ICD-10-CM | POA: Diagnosis not present

## 2020-11-25 DIAGNOSIS — M25551 Pain in right hip: Secondary | ICD-10-CM | POA: Diagnosis not present

## 2020-11-25 DIAGNOSIS — R5383 Other fatigue: Secondary | ICD-10-CM | POA: Insufficient documentation

## 2020-11-25 DIAGNOSIS — M25552 Pain in left hip: Secondary | ICD-10-CM

## 2020-11-25 NOTE — Patient Instructions (Signed)
 Anti-DNA Antibody Test Why am I having this test? The anti-DNA antibody test helps with the diagnosis and follow-up of systemic lupus erythematosus (SLE). It is also used to monitor treatment of this condition as the antibody decreases with successful therapy. What is being tested? This test measures the amount of anti-DNA antibody in the blood. This antibody is found in 65-80% of patients with active SLE. This antibody is not as common in patients who have other diseases. What kind of sample is taken? A blood sample is required for this test. It is usually collected by inserting a needle into a blood vessel.   How are the results reported? Your test results will be reported as a value. Your test results may also be reported as positive, intermediate, or negative. Your health care provider will compare your results to normal ranges that were established after testing a large group of people (reference values). Reference values may vary among labs and hospitals. For this test, common reference values are:  Positive: 10 or more international units/mL.  Intermediate: 5-9 international units/mL.  Negative: Less than 5 international units/mL. What do the results mean? Positive results, which are associated with results that are higher than the reference values, may indicate:  Autoimmune disorders such as SLE.  Infectious mononucleosis.  Chronic liver conditions. Intermediate results mean that the anti-DNA antibody levels are higher than normal, but not high enough to be considered positive. Negative results mean that you do not have the anti-DNA antibody that is associated with these conditions. Talk with your health care provider about what your results mean. Questions to ask your health care provider Ask your health care provider, or the department that is doing the test:  When will my results be ready?  How will I get my results?  What are my treatment options?  What other tests  do I need?  What are my next steps? Summary  The anti-DNA antibody test helps with the diagnosis and follow-up of systemic lupus erythematosus (SLE). It is also used to monitor treatment of this condition as the antibody decreases with successful therapy.  This test measures the amount of anti-DNA antibody in the blood.  Elevated levels of anti-DNA antibody can be seen in patients with SLE and certain other conditions.    Complement Assay Test Why am I having this test? Complement refers to a group of proteins that are part of the body's disease-fighting system (immune system). A complement assay test provides information about some or all of these proteins. You may have this test:  To diagnose a lack, or deficiency, of certain complement proteins. Deficiencies can be passed from parent to child (inherited).  To monitor an infection or autoimmune disease.  If you have unexplained inflammation or swelling (edema).  If you have bacterial infections again and again. What is being tested? This test can be used to measure:  Total complement. This is the total number of protein complements in your blood.  The number of each kind of complement in your blood. The nine main kinds of complement are labeled C1 through C9. Some of these complements, such as C3 and C4, are especially important and have many functions in the body. Depending on why you are having the test, your health care provider may test your total complement or only some individual complements, such as C3 and C4. The total complement assay test may be done before individual complements are tested. What kind of sample is taken? A blood sample is required   for this test. It is usually collected by inserting a needle into a blood vessel.   Tell a health care provider about:  Any allergies you have.  All medicines you are taking, including vitamins, herbs, eye drops, creams, and over-the-counter medicines.  Any blood disorders  you have.  Any surgeries you have had.  Any medical conditions you have.  Whether you are pregnant or may be pregnant. How are the results reported? Your results will be reported as a value that tells you how much complement is in your blood. This will be given as units per milliliter of blood (units/mL) or as milligrams per deciliter of blood (mg/dL). Your results may be reported as total complement, or as individual complements, or both. Your health care provider will compare your results to normal ranges that were established after testing a large group of people (reference ranges). Reference ranges may vary among labs and hospitals. For this test, reference ranges for some of the most commonly measured complement assays may be:  Total complement: 30-75 units/mL.  C2: 1-4 mg/dL.  C3: 75-175 mg/dL.  C4: 22-45 units/mL. What do the results mean? Results within reference ranges are considered normal, which means you have a normal amount of complement in your blood. Results that are higher than the reference ranges may be caused by:  Inflammatory disease.  Heart attack.  Cancer. Complement deficiencies, or results lower than the reference ranges, may be caused by:  Certain inherited conditions.  Autoimmune disease.  Certain liver diseases.  Malnutrition.  Certain types of anemia that result in breakdown of red blood cells (hemolytic anemia). Talk with your health care provider about what your results mean. Questions to ask your health care provider Ask your health care provider, or the department that is doing the test:  When will my results be ready?  How will I get my results?  What are my treatment options?  What other tests do I need?  What are my next steps? Summary  Complement refers to a group of proteins that are part of the body's disease-fighting system (immune system). A complement assay test can provide information about some or all of these  proteins.  You may have a complement assay test to help diagnose a complement deficiency, and to monitor some infections or autoimmune disease.  Talk with your health care provider about what your results mean.

## 2020-11-26 LAB — ANTI-SMITH ANTIBODY: ENA SM Ab Ser-aCnc: <1 AI

## 2020-11-26 LAB — RNP ANTIBODY: Ribonucleic Protein(ENA) Antibody, IgG: <1 AI

## 2020-11-26 LAB — ANTI-DNA ANTIBODY, DOUBLE-STRANDED: ds DNA Ab: 1 IU/mL

## 2020-11-26 LAB — C3 AND C4
C3 Complement: 80 mg/dL — ABNORMAL LOW (ref 83–193)
C4 Complement: 23 mg/dL (ref 15–57)

## 2020-11-26 LAB — SJOGRENS SYNDROME-A EXTRACTABLE NUCLEAR ANTIBODY: SSA (Ro) (ENA) Antibody, IgG: <1 AI

## 2020-11-26 LAB — SJOGRENS SYNDROME-B EXTRACTABLE NUCLEAR ANTIBODY: SSB (La) (ENA) Antibody, IgG: <1 AI

## 2020-11-26 NOTE — Progress Notes (Signed)
Spoke with Kathryn Park regarding her lab results specific ENA tests checked were all negative.  Her complement C3 level is very slightly below normal at 80, with normal complement C4.  On review she denies any history of chronic urticaria or previous episodes of angioedema.  I think this is currently unlikely to represent a significant immunodeficiency.  For the bilateral hip symptoms this does seem pretty consistent with snapping hip syndrome which is a tendinitis or tracking/impingement problem over the head of the greater trochanter.  She might benefit with input from sports medicine evaluation as this is not an inflammatory disease process.  She plans to follow-up with primary care clinic for subsequent evaluation and management.

## 2020-11-28 ENCOUNTER — Encounter: Payer: Self-pay | Admitting: Gynecologic Oncology

## 2020-12-05 ENCOUNTER — Ambulatory Visit: Payer: BC Managed Care – PPO | Admitting: Physician Assistant

## 2020-12-05 ENCOUNTER — Other Ambulatory Visit: Payer: Self-pay

## 2020-12-05 ENCOUNTER — Encounter: Payer: Self-pay | Admitting: Physician Assistant

## 2020-12-05 VITALS — BP 120/78 | HR 82 | Temp 98.2°F | Ht 67.5 in | Wt 131.0 lb

## 2020-12-05 DIAGNOSIS — M25552 Pain in left hip: Secondary | ICD-10-CM | POA: Diagnosis not present

## 2020-12-05 DIAGNOSIS — M25551 Pain in right hip: Secondary | ICD-10-CM | POA: Diagnosis not present

## 2020-12-05 DIAGNOSIS — F411 Generalized anxiety disorder: Secondary | ICD-10-CM | POA: Diagnosis not present

## 2020-12-05 DIAGNOSIS — R634 Abnormal weight loss: Secondary | ICD-10-CM | POA: Diagnosis not present

## 2020-12-05 NOTE — Progress Notes (Signed)
Kathryn Park is a 35 y.o. female is here for follow up.  I acted as a Education administrator for [employer], PA-C Anselmo Pickler, LPN   History of Present Illness:   Chief Complaint  Patient presents with  . Unintentional weight loss  . Anxiety    HPI  Unintentional weight loss Pt following up today, weight is up 4 lbs since last month. Pt says she is trying to eat better, snacking 3-4 times a day. Clothes are getting tighter, not checking weight regularly.  Denies any unusual new symptoms, abdominal bloating, nausea, etc while sticking to RD recommendations. Did have some dietary indiscretion since last seeing me.  Wt Readings from Last 4 Encounters:  12/05/20 131 lb (59.4 kg)  11/25/20 130 lb 6.4 oz (59.1 kg)  11/17/20 128 lb 14.4 oz (58.5 kg)  11/05/20 127 lb (57.6 kg)    Anxiety Pt was started on Buspar 5 mg TID prn a month ago. Pt is using it daily in the AM and took it before a doctor appt thinks it is helping. Has not noticed any side effects or worsened anxiety. Planning to see therapist soon. Denies SI/HI.   Bilateral hip pain Ongoing. Saw Dr. Benjamine Mola who didn't think an autoimmune issue was at play. She is still having hip pain, would like sports medicine referral.  There are no preventive care reminders to display for this patient.  Past Medical History:  Diagnosis Date  . Abnormal Pap smear of cervix   . Allergy   . Anemia    suspected poor diet; vegetarian during teens  . Anxiety   . Asthma    childhood  . Cancer ([place])   . Depression   . Dysmenorrhea   . Family history of breast cancer   . Family history of kidney cancer   . History of iron deficiency anemia    age 50  . Joint pain   . Leukocytosis   . Low vitamin D level   . Migraine with aura    never on rx or saw neurology  . Ovarian cyst    Left  . Pneumonia 01/2012    left lower lobe  . Pre-diabetes 2015   had a "borderline" A1c  . STD (sexually transmitted disease)    HSV1   . Wears contact lenses   . Wears glasses      Social History   Tobacco Use  . Smoking status: Former Smoker    Packs/day: 1.00    Types: Cigarettes  . Smokeless tobacco: Never Used  Vaping Use  . Vaping Use: Never used  Substance Use Topics  . Alcohol use: Not Currently  . Drug use: No    Past Surgical History:  Procedure Laterality Date  . BREAST BIOPSY Right 04/2020   benign  . COLPOSCOPY    . LAPAROSCOPIC OVARIAN CYSTECTOMY Left 11/13/2019   Procedure: LAPAROSCOPIC OVARIAN CYSTECTOMY/POSSIBLE LEFT OOPHORECTOMY/ COLLECTION OF PELVIC WASHINGS/ks;  Surgeon: Nunzio Cobbs, MD;  Location: [facility];  Service: Gynecology;  Laterality: Left;  Possible left oophorectomy Colection of pelvic washings To follow first case of the day  . LEFT OOPHORECTOMY Left 11/2019  . TONSILLECTOMY    . UPPER GI ENDOSCOPY      Family History  Problem Relation Age of Onset  . Diabetes Mother   . Hypertension Mother   . Stroke Mother        TIA  . Factor V Leiden deficiency Mother   .  Other Mother        pituitary tumor  . Crohn's disease Father   . Kidney cancer Father        cancerous tumor of kidney  . Liver disease Father   . Breast cancer Maternal Grandmother        dx. 31s  . Stroke Maternal Grandfather   . Other Brother        ear tumor, dx. age 58-6  . Rheum arthritis Maternal Great-grandmother   . Cancer Other   . Ovarian cancer Neg Hx   . Endometrial cancer Neg Hx   . Colon cancer Neg Hx     PMHx, SurgHx, SocialHx, FamHx, Medications, and Allergies were reviewed in the Visit Navigator and updated as appropriate.   Patient Active Problem List   Diagnosis Date Noted  . Bilateral hip pain 11/25/2020  . Positive ANA (antinuclear antibody) 11/25/2020  . Other fatigue 11/25/2020  . Bladder retention 10/13/2020  . HSV-1 (herpes simplex virus 1) infection 10/13/2020  . History of anorexia nervosa 10/13/2020  . GAD (generalized anxiety disorder)  10/13/2020  . Family history of breast cancer   . Family history of kidney cancer   . 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 Smoking status: Former Smoker    Packs/day: 1.00    Types: Cigarettes  . Smokeless tobacco: Never Used  Vaping Use  . Vaping Use: Never used  Substance Use Topics  . Alcohol use: Not Currently  . Drug use: No    Current Medications and Allergies:    Current Outpatient Medications:  .  busPIRone (BUSPAR) 5 MG tablet, Take 1 tablet (5 mg total) by mouth 3 (three) times daily as needed (anxiety)., Disp: 90 tablet, Rfl: 1 .  Fexofenadine-Pseudoephedrine (ALLEGRA-D 24 HOUR PO), Take 1 tablet by mouth as needed., Disp: , Rfl:  .  Multiple Vitamins-Minerals (MULTIPLE VITAMINS/WOMENS PO), Take 1 tablet by mouth daily in the afternoon., Disp: , Rfl:  .  valACYclovir (VALTREX) 1000 MG tablet, Take 2 tablets (2000 mg) by mouth twice a day for 24 hours. (Patient taking differently: as needed. Take 2 tablets (2000 mg) by mouth twice a day for 24 hours as needed for cold sore), Disp: 30 tablet, Rfl: 1   Allergies  Allergen Reactions  . Shellfish Allergy Anaphylaxis    Throat closes  . Other Other (See Comments)    Watermelon, mold/mildew, pet dander, dust and dust mites  Swelling of eyes and congestion  . Latex Rash  . Sulfa Antibiotics Rash    Review of Systems   ROS Negative unless otherwise specified per HPI.  Vitals:   Vitals:   12/05/20 0930  BP: 120/78  Pulse: 82  Temp: 98.2 F (36.8 C)  TempSrc: Temporal  SpO2: 97%  Weight: 131 lb (59.4 kg)  Height: 5' 7.5" (1.715 m)     Body mass index is 20.21 kg/m.   Physical Exam:    Physical Exam Vitals and nursing note reviewed.  Constitutional:      General: She is not in acute distress.    Appearance: She is well-developed. She is not ill-appearing, toxic-appearing or sickly-appearing.  Cardiovascular:     Rate and Rhythm: Normal rate and regular  rhythm.     Pulses: Normal pulses.     Heart sounds: Normal heart sounds, S1 normal and S2 normal.     Comments: No LE edema Pulmonary:     Effort: Pulmonary effort  is normal.     Breath sounds: Normal breath sounds.  Skin:    General: Skin is warm, dry and intact.  Neurological:     Mental Status: She is alert.     GCS: GCS eye subscore is 4. GCS verbal subscore is 5. GCS motor subscore is 6.  Psychiatric:        Mood and Affect: Mood and affect normal.        Speech: Speech normal.        Behavior: Behavior normal. Behavior is cooperative.      Assessment and Plan:    Kathryn Park was seen today for unintentional weight loss and anxiety.  Diagnoses and all orders for this visit:  Bilateral hip pain Referral to sports medicine. -     Ambulatory referral to Sports Medicine  GAD (generalized anxiety disorder) Slightly improved. Discussed need to work on anxiety-provoking ruminating thoughts -- encouraged close follow-up with therapist. Encouraged regular use of buspar in am, may increase to 7.5 mg or 10 mg daily to see if she gets more benefit.  Unintentional weight loss Stabilized. I discussed with her that I'm not going to follow-up on this other than routine follow-ups but I would like for her reach out if she feels like her weight is starting to decline.  CMA or LPN served as scribe during this visit. History, Physical, and Plan performed by medical provider. The above documentation has been reviewed and is accurate and complete.  [name], PA-C [place] [place] 12/05/2020  Follow-up: No follow-ups on file.

## 2020-12-05 NOTE — Patient Instructions (Addendum)
It was great to see you!  You are doing great.  Increase buspar to 7.5 mg or 10 mg at one dose if needed. Max dose is 30 mg per day. You can always send me a mychart if you have concerns.  I agree with not checking weight regularly at this point. If clothes start to feel really loose, come see me. Continue high protein recommendations from [facility].  A referral has been placed for you to see Dr. [name] with [facility]. Someone from there office will be in touch soon regarding your appointment with him. His location: [place] at [facility] [address] on the 1st floor.   Phone number [phone], Fax [phone].  This location is across the street from the entrance to [employer] and in the same complex as the [facility] and [employer] bank  Take care,  [name] PA-C

## 2020-12-08 NOTE — Progress Notes (Signed)
Subjective:   I, Kathryn Park, LAT, Kathryn Park, am serving as scribe for Dr. Lynne Park.  I'm seeing this patient as a consultation for Kathryn Park, [place]. Note will be routed back to referring provider/PCP.  CC: Bilateral hip pain  HPI: Pt is a 35 y/o female c/o bilat hip pain, R >L, ongoing for several years w/ no known MOI.  She is also c/o B hand and wrist pain for which she's been referred to rhematology. Pt locates hip pain to her B post hip that radiates into her ant hip and groin. Pt had a prior evaluation and rheumatalogical work up by Dr. Benjamine Mola.   Low back pain: No Radiates: No Hip mechanical symptoms: Yes audible popping w/ stepping into her pants and butterfly stretch position LE Numbness/tingling: No LE Weakness: No Aggravates: walking; hiking; prolonged standing; prolonged sitting; driving Treatments tried: IBU; stretching; pelvic floor PT  Past medical history, Surgical history, Family history, Social history, Allergies, and medications have been entered into the medical record, reviewed.   Review of Systems: No new headache, visual changes, nausea, vomiting, diarrhea, constipation, dizziness, abdominal pain, skin rash, fevers, chills, night sweats, weight loss, swollen lymph nodes, body aches, joint swelling, muscle aches, chest pain, shortness of breath, mood changes, visual or auditory hallucinations.   Objective:    Vitals:   12/11/20 1456  BP: 102/68  Pulse: 90  SpO2: 98%   General: Well Developed, well nourished, and in no acute distress.  Neuro/Psych: Alert and oriented x3, extra-ocular muscles intact, able to move all 4 extremities, sensation grossly intact. Skin: Warm and dry, no rashes noted.  Respiratory: Not using accessory muscles, speaking in full sentences, trachea midline.  Cardiovascular: Pulses palpable, no extremity edema. Abdomen: Does not appear distended. MSK:  L-spine normal-appearing Nontender midline. Normal lumbar motion. Lower extremity  strength reflexes and sensation are intact except noted below.  Right hip normal-appearing Normal motion pain with flexion and internal rotation. Tender palpation greater trochanter. Hip abduction and external rotation strength are diminished 4/5 with some pain. Internal rotation and adduction strength intact.  Right hip normal-appearing Normal motion. Tender palpation greater trochanter. Hip abduction and external rotation strength are mildly diminished 4+/5 with some pain. External rotation and abduction strength are intact.  Lab and Radiology Results  X-ray images of hips visible on CT scan abdomen and pelvis February 2021 personally independently interpreted today showing some subchondral cyst right hip concerning for early labral tear and femoral acetabular impingement.  X-rays obtained bilateral hips today personally independently interpreted  Right hip: Acetabular rim extends concerning for pincer type femoral acetabular impingement.  No severe DJD noted.  No fractures noted.  Left hip: Acetabular rim also extends concerning for pincer type femoral acetabular impingement pattern.  No severe DJD noted.  No fractures noted.   Await formal radiology review  Impression and Recommendations:    Assessment and Plan: 35 y.o. female with to factorial hip pain right worse than left.  A large portion of pain is due to hip abductor tendinopathy or greater trochanteric bursitis.  Patient is a great candidate for physical therapy.  However she has some anterior hip pain especially in her right hip that is concerning for femoral acetabular impingement.  If not significantly improved with physical therapy will proceed to MRI arthrogram for further evaluation and potential hip scope.Marland Kitchen  PDMP not reviewed this encounter. Orders Placed This Encounter  Procedures  . Ambulatory referral to Physical Therapy    Referral Priority:   Routine  Referral Type:   Physical Medicine    Referral  Reason:   Specialty Services Required    Requested Specialty:   Physical Therapy   No orders of the defined types were placed in this encounter.   Discussed warning signs or symptoms. Please see discharge instructions. Patient expresses understanding.   The above documentation has been reviewed and is accurate and complete Kathryn Park, M.D.

## 2020-12-11 ENCOUNTER — Ambulatory Visit (INDEPENDENT_AMBULATORY_CARE_PROVIDER_SITE_OTHER): Payer: BC Managed Care – PPO

## 2020-12-11 ENCOUNTER — Encounter: Payer: Self-pay | Admitting: Family Medicine

## 2020-12-11 ENCOUNTER — Other Ambulatory Visit: Payer: Self-pay

## 2020-12-11 ENCOUNTER — Ambulatory Visit: Payer: Self-pay

## 2020-12-11 ENCOUNTER — Other Ambulatory Visit: Payer: Self-pay | Admitting: Family Medicine

## 2020-12-11 ENCOUNTER — Ambulatory Visit (INDEPENDENT_AMBULATORY_CARE_PROVIDER_SITE_OTHER): Payer: BC Managed Care – PPO | Admitting: Family Medicine

## 2020-12-11 VITALS — BP 102/68 | HR 90 | Ht 67.5 in | Wt 130.6 lb

## 2020-12-11 DIAGNOSIS — M25551 Pain in right hip: Secondary | ICD-10-CM

## 2020-12-11 DIAGNOSIS — M25552 Pain in left hip: Secondary | ICD-10-CM

## 2020-12-11 NOTE — Patient Instructions (Addendum)
Thank you for coming in today.  I've referred you to Physical Therapy.  Let us know if you don't hear from them in one week.  Recheck in 6-8 weeks.   I think you have hip abductor weakness and tendonitis.   I think you also may have a hip labrum tear or femoral acetabular impingement.

## 2020-12-12 ENCOUNTER — Encounter: Payer: Self-pay | Admitting: Family Medicine

## 2020-12-15 NOTE — Progress Notes (Signed)
Bilateral hip x-ray shows no acute findings.  If needed MRI arthrogram would be helpful.

## 2020-12-22 DIAGNOSIS — F4322 Adjustment disorder with anxiety: Secondary | ICD-10-CM | POA: Diagnosis not present

## 2020-12-24 ENCOUNTER — Encounter: Payer: Self-pay | Admitting: Physical Therapy

## 2020-12-24 ENCOUNTER — Other Ambulatory Visit: Payer: Self-pay

## 2020-12-24 ENCOUNTER — Ambulatory Visit: Payer: BC Managed Care – PPO | Admitting: Physical Therapy

## 2020-12-24 DIAGNOSIS — M6281 Muscle weakness (generalized): Secondary | ICD-10-CM

## 2020-12-24 DIAGNOSIS — M25552 Pain in left hip: Secondary | ICD-10-CM

## 2020-12-24 DIAGNOSIS — M25551 Pain in right hip: Secondary | ICD-10-CM

## 2020-12-24 NOTE — Patient Instructions (Signed)
Access Code: [email or web address] URL: [email or web address] Date: 12/24/2020 Prepared by: Daleen Bo  Exercises Supine Bridge - 1 x daily - 3-4 x weekly - 3 sets - 10 reps Sit to Stand with Resistance Around Legs - 1 x daily - 3-4 x weekly - 5 sets - 5 reps Side Stepping with Resistance at Thighs - 1 x daily - 3-4 x weekly - 2 sets - 1 reps - 43ft hold

## 2020-12-24 NOTE — Therapy (Signed)
[facility] [address] [place], [place], [address] Phone: [phone]   Fax:  [phone]  Physical Therapy Evaluation  Patient Details  Name: Kathryn Park MRN: [id number] Date of Birth: October 14, 1985 Referring Provider (PT): Dr. [name]   Encounter Date: 12/24/2020   PT End of Session - 12/24/20 0954    Visit Number 1    Number of Visits 16    Date for PT Re-Evaluation 02/18/21    Authorization Type BCBS    PT Start Time 0803    PT Stop Time 0846    PT Time Calculation (min) 43 min    Activity Tolerance Patient tolerated treatment well;Patient limited by pain;Patient limited by fatigue           Past Medical History:  Diagnosis Date  . Abnormal Pap smear of cervix   . Allergy   . Anemia    suspected poor diet; vegetarian during teens  . Anxiety   . Asthma    childhood  . Cancer ([place])   . Depression   . Dysmenorrhea   . Family history of breast cancer   . Family history of kidney cancer   . History of iron deficiency anemia    age 14  . Joint pain   . Leukocytosis   . Low vitamin D level   . Migraine with aura    never on rx or saw neurology  . Ovarian cyst    Left  . Pneumonia 01/2012    left lower lobe  . Pre-diabetes 2015   had a "borderline" A1c  . STD (sexually transmitted disease)    HSV1  . Wears contact lenses   . Wears glasses     Past Surgical History:  Procedure Laterality Date  . BREAST BIOPSY Right 04/2020   benign  . COLPOSCOPY    . LAPAROSCOPIC OVARIAN CYSTECTOMY Left 11/13/2019   Procedure: LAPAROSCOPIC OVARIAN CYSTECTOMY/POSSIBLE LEFT OOPHORECTOMY/ COLLECTION OF PELVIC WASHINGS/ks;  Surgeon: Nunzio Cobbs, MD;  Location: [facility];  Service: Gynecology;  Laterality: Left;  Possible left oophorectomy Colection of pelvic washings To follow first case of the day  . LEFT OOPHORECTOMY Left 11/2019  . TONSILLECTOMY    . UPPER GI ENDOSCOPY       There were no vitals filed for this visit.    Subjective Assessment - 12/24/20 0808    Subjective Pt states her hips have been bothering her for a few years. After a cancer diagnosis and sx in 2021 to remove a cyst, she thought the pain would go away but it has not changed. She went from being more active work to being more sedentary while taking care of her mother and while she was recovering from sx. She states she is trying to walk more recently. She is doing yoga about 3x a week. She notices moments of weakness and shakiness after prolonged standing, like while doing laundry. She states R>L pain with the L being "lounder" when popping. She states with dressing and butterfly stretch, the hips will pop. Pt denies catching or locking. Pt denies superificial pain. Aggs: legs crossed, sitting, walking, standing Eases: yoga, resting, changing position. 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 Denies NT. Denies drop attacks, BB, fever nausea vomitting. Pt states she had unknown weight loss back in Nov 2021 but has seen onoclogist  and nutritionist. Best 0/10, Worst /10 pain.    How long can you sit comfortably? 30 mins    How long can you stand comfortably? 30 mins    How long can you walk comfortably? 30 mins    Diagnostic tests Xray- shows potential bilateral pincer lesion    Patient Stated Goals alleviate pain and discomfort and get stronger. Pt wants to return to running.    Currently in Pain? Yes    Pain Score 3     Pain Location Hip    Pain Orientation Right;Left;Lateral;Posterior    Pain Descriptors / Indicators Aching;Sharp    Pain Type Chronic pain    Pain Radiating Towards anterior and post hip, none towards knees    Pain Onset More than a month ago    Pain Frequency Intermittent    Multiple Pain Sites No              [facility] PT Assessment - 12/24/20 0001      Assessment   Medical Diagnosis  bilateral hip pain    Referring Provider (PT) Dr. [name]    Prior Therapy Pelvic floor      Precautions   Precautions None      Restrictions   Weight Bearing Restrictions No      Balance Screen   Has the patient fallen in the past 6 months No      [place] residence      Prior Function   Level of Independence Independent      Cognition   Overall Cognitive Status Within Functional Limits for tasks assessed      Observation/Other Assessments   Other Surveys  Lower Extremity Functional Scale    Lower Extremity Functional Scale  57.5% function      Functional Tests   Functional tests Squat;Step up;Hopping;Step down      Squat   Comments decreased ROM, decreased eccentric lowering control, depth above parallel, dynamic valgus      Step Up   Comments Trendelenberg      Step Down   Comments dynamic valgus, knee wobble with eccentric      Hopping   Comments not tested due to pain      ROM / Strength   AROM / PROM / Strength AROM;PROM;Strength      AROM   Overall AROM  Within functional limits for tasks performed      PROM   Overall PROM  Within functional limits for tasks performed    Overall PROM Comments hypermobile into bilat hip, limited by pain at end range      Strength   Overall Strength Deficits    Overall Strength Comments 4-/5 throughout bilateral hips, with painful cavatation during ADD MMT that decreased after resting and joint mobilization      Flexibility   Soft Tissue Assessment /Muscle Length yes    Hamstrings WNL    Quadriceps WNL    Piriformis WNL      Palpation   SI assessment  L ant innominate rotation, corrected with SIJ shotgun MET    Palpation comment TTP bilateral gluteals and hip rotators      Special Tests    Special Tests Hip Special Tests    Hip Special Tests  Saralyn Pilar (FABER) Test;Hip Scouring;Anterior Hip Impingement Test      Saralyn Pilar [facility]) Test   Findings Positive    Side Right;Left       Hip Scouring   Findings Negative  Anterior Hip Impingement Test    Findings Positive    Side  Right      Transfers   Transfers Sit to Stand    Sit to Stand From elevated surface;With upper extremity assist      Ambulation/Gait   Ambulation/Gait Yes    Ambulation/Gait Assistance 7: Independent    Gait Pattern Within Functional Limits                      Objective measurements completed on examination: See above findings.       [facility] Adult PT Treatment/Exercise - 12/24/20 0001      Exercises   Exercises Knee/Hip      Knee/Hip Exercises: Standing   Functional Squat Limitations STS from raised table height, band around knees 10x    Other Standing Knee Exercises sidestepping band around knees red 40f      Knee/Hip Exercises: Supine   Bridges 2 sets;10 reps      Manual Therapy   Manual Therapy Joint mobilization    Joint Mobilization LAD grade I-II bilat hips                  PT Education - 12/24/20 0953    Education Details MOI, prognosis, diagnosis, SIJ anatomy, deconditioning, HEP, expectations with soreness, muscle firing patterns, POC    Person(s) Educated Patient    Methods Explanation;Demonstration;Handout    Comprehension Verbalized understanding;Returned demonstration            PT Short Term Goals - 12/24/20 0958      PT SHORT TERM GOAL #1   Title Pt will become independent with HEP in order to demonstrate synthesis of PT education.    Time 2    Period Weeks    Status New      PT SHORT TERM GOAL #2   Title Pt will be able to demonstrate full depth squat to floor without pain  in order to demonstrate functional improvement in LE function for self-care and house hold duties.    Time 4    Period Weeks    Status New             PT Long Term Goals - 12/24/20 0959      PT LONG TERM GOAL #1   Title Pt will have a >9pt increase in LEFS in order to demonstrate clinically signficant improvement in LE function for return  to full participation with occupation, ADL, and fitness.    Time 6    Period Weeks    Status New      PT LONG TERM GOAL #2   Title Pt will be able to squat and deadlift >45lbs  in order to demonstrate functional improvement in bilat UE function for return to exercise.    Time 6    Period Weeks      PT LONG TERM GOAL #3   Title Pt will be able to perform SL hopping >30s without pain and SL squat from chair in order to demonstrate functional improvement LE function for return to running.    Time 8    Period Weeks    Status New                  Plan - 12/24/20 0955    Clinical Impression Statement Pt is a 35y.o. female presenting to PT eval today for CC of bilateral hip pain. Pt presents with globalized LE and hip weakness, decreased motor control with functional  movement screen, and pain with end range motions. Pt's s/s appear consistent with SIJ related pain following deconditioning post sx and decrease in physical activity. Pt is limited by muscle endurance and strength with exercise rather than pain. Pt's impairments limit her from full participation with ADL, household activities, and exercise/physical fitness. Pt would benefit from continued skilled therapy in order to maximize bilat LE function and strength for full return to self care, ADL, and exercise.    Personal Factors and Comorbidities Fitness;Comorbidity 1;Time since onset of injury/illness/exacerbation    Examination-Activity Limitations Bathing;Dressing;Sit;Transfers;Bend;Lift;Squat;Carry;Locomotion Level;Stairs;Stand    Examination-Participation Restrictions Occupation;Volunteer;Cleaning;Laundry;Yard Work;Community Activity    Stability/Clinical Decision Making Stable/Uncomplicated    Clinical Decision Making Low    Rehab Potential Excellent    PT Frequency 1x / week   pt to schedule at 1x/week   PT Duration 8 weeks    PT Treatment/Interventions ADLs/Self Care Home Management;Aquatic Therapy;Biofeedback;Electrical  Stimulation;Iontophoresis 20m/ml Dexamethasone;Traction;Gait training;Stair training;Functional mobility training;Therapeutic activities;Therapeutic exercise;Balance training;Neuromuscular re-education;Patient/family education;Orthotic Fit/Training;Manual techniques;Passive range of motion;Dry needling;Energy conservation;Taping;Spinal Manipulations;Joint Manipulations    PT Next Visit Plan review HEP, bridge with band/march, step up front and lateral, RDL           Patient will benefit from skilled therapeutic intervention in order to improve the following deficits and impairments:  Decreased endurance,Hypermobility,Increased muscle spasms,Improper body mechanics,Pain,Decreased activity tolerance,Decreased strength  Visit Diagnosis: Bilateral hip pain  Muscle weakness (generalized)     Problem List Patient Active Problem List   Diagnosis Date Noted  . Bilateral hip pain 11/25/2020  . Positive ANA (antinuclear antibody) 11/25/2020  . Other fatigue 11/25/2020  . Bladder retention 10/13/2020  . HSV-1 (herpes simplex virus 1) infection 10/13/2020  . History of anorexia nervosa 10/13/2020  . GAD (generalized anxiety disorder) 10/13/2020  . Family history of breast cancer   . Family history of kidney cancer   . 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

## 2020-12-31 ENCOUNTER — Other Ambulatory Visit: Payer: Self-pay

## 2020-12-31 ENCOUNTER — Ambulatory Visit: Payer: BC Managed Care – PPO | Admitting: Physical Therapy

## 2020-12-31 ENCOUNTER — Encounter: Payer: Self-pay | Admitting: Physical Therapy

## 2020-12-31 DIAGNOSIS — M25551 Pain in right hip: Secondary | ICD-10-CM

## 2020-12-31 DIAGNOSIS — F4322 Adjustment disorder with anxiety: Secondary | ICD-10-CM | POA: Diagnosis not present

## 2020-12-31 DIAGNOSIS — M25552 Pain in left hip: Secondary | ICD-10-CM

## 2020-12-31 DIAGNOSIS — M6281 Muscle weakness (generalized): Secondary | ICD-10-CM

## 2020-12-31 NOTE — Therapy (Signed)
[facility] [address] [place], [place], [address] Phone: [phone]   Fax:  [phone]  Physical Therapy Treatment  Patient Details  Name: Kathryn Park MRN: [id number] Date of Birth: 1986/05/10 Referring Provider (PT): Dr. [name]   Encounter Date: 12/31/2020   PT End of Session - 12/31/20 0812    Visit Number 2    Number of Visits 16    Date for PT Re-Evaluation 02/18/21    Authorization Type BCBS    PT Start Time 0805    PT Stop Time 0845    PT Time Calculation (min) 40 min    Activity Tolerance Patient tolerated treatment well;Patient limited by pain;Patient limited by fatigue           Past Medical History:  Diagnosis Date  . Abnormal Pap smear of cervix   . Allergy   . Anemia    suspected poor diet; vegetarian during teens  . Anxiety   . Asthma    childhood  . Cancer ([place])   . Depression   . Dysmenorrhea   . Family history of breast cancer   . Family history of kidney cancer   . History of iron deficiency anemia    age 89  . Joint pain   . Leukocytosis   . Low vitamin D level   . Migraine with aura    never on rx or saw neurology  . Ovarian cyst    Left  . Pneumonia 01/2012    left lower lobe  . Pre-diabetes 2015   had a "borderline" A1c  . STD (sexually transmitted disease)    HSV1  . Wears contact lenses   . Wears glasses     Past Surgical History:  Procedure Laterality Date  . BREAST BIOPSY Right 04/2020   benign  . COLPOSCOPY    . LAPAROSCOPIC OVARIAN CYSTECTOMY Left 11/13/2019   Procedure: LAPAROSCOPIC OVARIAN CYSTECTOMY/POSSIBLE LEFT OOPHORECTOMY/ COLLECTION OF PELVIC WASHINGS/ks;  Surgeon: Nunzio Cobbs, MD;  Location: [facility];  Service: Gynecology;  Laterality: Left;  Possible left oophorectomy Colection of pelvic washings To follow first case of the day  . LEFT OOPHORECTOMY Left 11/2019  . TONSILLECTOMY    . UPPER GI ENDOSCOPY       There were no vitals filed for this visit.   Subjective Assessment - 12/31/20 0808    Subjective Pt states she has had decrease in frequency and intensity of pain. The HEP has gotten a little easier and she has not had pain while doing them. She still continues to have pain with sitting. Pt reports hips feel stiff.   How long can you sit comfortably? 30 mins    How long can you stand comfortably? 30 mins    How long can you walk comfortably? 30 mins    Diagnostic tests Xray- shows potential bilateral pincer lesion    Patient Stated Goals alleviate pain and discomfort and get stronger. Pt wants to return to running.    Currently in Pain? No/denies    Pain Score 0-No pain    Pain Onset More than a month ago                             [facility] Adult PT Treatment/Exercise - 12/31/20 0001      Transfers   Transfers Sit to Stand    Sit to Stand From elevated surface;With upper extremity assist  Ambulation/Gait   Ambulation/Gait Yes    Ambulation/Gait Assistance 7: Independent    Gait Pattern Within Functional Limits      Exercises   Exercises Knee/Hip      Knee/Hip Exercises: Standing   Forward Step Up 10 reps;2 sets    Functional Squat Limitations STS from raised table height, band around knees 10x    Other Standing Knee Exercises sidestepping band around knees red 62ft    Other Standing Knee Exercises hip hinge 10x at wall      Knee/Hip Exercises: Supine   Bridges 2 sets;10 reps      Manual Therapy   Manual Therapy Joint mobilization    Joint Mobilization LAD grade I-II bilat hips, hip inf scoop mob grade III                  PT Education - 12/31/20 0812    Education Details SIJ anatomy, deconditioning, HEP, expectations with exercise soreness, muscle firing patterns    Person(s) Educated Patient    Methods Explanation;Demonstration    Comprehension Verbalized understanding;Returned demonstration            PT Short Term Goals -  12/24/20 0958      PT SHORT TERM GOAL #1   Title Pt will become independent with HEP in order to demonstrate synthesis of PT education.    Time 2    Period Weeks    Status New      PT SHORT TERM GOAL #2   Title Pt will be able to demonstrate full depth squat to floor without pain  in order to demonstrate functional improvement in LE function for self-care and house hold duties.    Time 4    Period Weeks    Status New             PT Long Term Goals - 12/24/20 0959      PT LONG TERM GOAL #1   Title Pt will have a >9pt increase in LEFS in order to demonstrate clinically signficant improvement in LE function for return to full participation with occupation, ADL, and fitness.    Time 6    Period Weeks    Status New      PT LONG TERM GOAL #2   Title Pt will be able to squat and deadlift >45lbs  in order to demonstrate functional improvement in bilat UE function for return to exercise.    Time 6    Period Weeks      PT LONG TERM GOAL #3   Title Pt will be able to perform SL hopping >30s without pain and SL squat from chair in order to demonstrate functional improvement LE function for return to running.    Time 8    Period Weeks    Status New                 Plan - 12/31/20 0813    Clinical Impression Statement Pt presents with decreased pain at beginning of this session. Pt did respond well to inf hip mob with report of decreased stiffnes following manual therapy. Pt required increased cuing for hip hinge exercise at today's session but had improved tolerance and performance of previous HEP. Pt's does have occasional HA with STS transfer and was given cues for breathing that created minimal change. Pt reports it is low level and occurs with increased repetitions. Pt gave verbal understanding to pacing of HEP and monitoring HR to determine cause. Pt was able to  incoporate hip hinge at the wall but required cuing for lumbopelvic dissassociation. Progress strengthening as tol.  Pt would benefit from continued skilled therapy in order to maximize bilat LE function and strength for full return to self care, ADL, and exercise.    Personal Factors and Comorbidities Fitness;Comorbidity 1;Time since onset of injury/illness/exacerbation    Examination-Activity Limitations Bathing;Dressing;Sit;Transfers;Bend;Lift;Squat;Carry;Locomotion Level;Stairs;Stand    Examination-Participation Restrictions Occupation;Volunteer;Cleaning;Laundry;Yard Work;Community Activity    Stability/Clinical Decision Making Stable/Uncomplicated    Rehab Potential Excellent    PT Frequency 1x / week   pt to schedule at 1x/week   PT Duration 8 weeks    PT Treatment/Interventions ADLs/Self Care Home Management;Aquatic Therapy;Biofeedback;Electrical Stimulation;Iontophoresis 4mg /ml Dexamethasone;Traction;Gait training;Stair training;Functional mobility training;Therapeutic activities;Therapeutic exercise;Balance training;Neuromuscular re-education;Patient/family education;Orthotic Fit/Training;Manual techniques;Passive range of motion;Dry needling;Energy conservation;Taping;Spinal Manipulations;Joint Manipulations    PT Next Visit Plan review HEP, bridge with band/march,lateral, RDL           Patient will benefit from skilled therapeutic intervention in order to improve the following deficits and impairments:  Decreased endurance,Hypermobility,Increased muscle spasms,Improper body mechanics,Pain,Decreased activity tolerance,Decreased strength  Visit Diagnosis: Bilateral hip pain  Muscle weakness (generalized)     Problem List Patient Active Problem List   Diagnosis Date Noted  . Bilateral hip pain 11/25/2020  . Positive ANA (antinuclear antibody) 11/25/2020  . Other fatigue 11/25/2020  . Bladder retention 10/13/2020  . HSV-1 (herpes simplex virus 1) infection 10/13/2020  . History of anorexia nervosa 10/13/2020  . GAD (generalized anxiety disorder) 10/13/2020  . Family history of breast  cancer   . Family history of kidney cancer   . 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   Daleen Bo PT, DPT 12/31/20 12:52 PM   [facility] [place], [place], [address] Phone: [phone]   Fax:  [phone]  Name: [place] Sorn MRN: [id number] Date of Birth: 1985-12-04

## 2020-12-31 NOTE — Patient Instructions (Signed)
Access Code: [email or web address] URL: [email or web address] Date: 12/31/2020 Prepared by: Daleen Bo  Exercises Supine Bridge - 1 x daily - 3-4 x weekly - 3 sets - 10 reps Sit to Stand with Resistance Around Legs - 1 x daily - 3-4 x weekly - 5 sets - 5 reps Side Stepping with Resistance at Thighs - 1 x daily - 3-4 x weekly - 2 sets - 1 reps - 41ft hold Step Up - 1 x daily - 3-4 x weekly - 3 sets - 10 reps

## 2021-01-07 ENCOUNTER — Encounter: Payer: Self-pay | Admitting: Physical Therapy

## 2021-01-07 ENCOUNTER — Other Ambulatory Visit: Payer: Self-pay

## 2021-01-07 ENCOUNTER — Ambulatory Visit (INDEPENDENT_AMBULATORY_CARE_PROVIDER_SITE_OTHER): Payer: Commercial Managed Care - PPO | Admitting: Physical Therapy

## 2021-01-07 DIAGNOSIS — M6281 Muscle weakness (generalized): Secondary | ICD-10-CM | POA: Diagnosis not present

## 2021-01-07 DIAGNOSIS — M25551 Pain in right hip: Secondary | ICD-10-CM | POA: Diagnosis not present

## 2021-01-07 DIAGNOSIS — M25552 Pain in left hip: Secondary | ICD-10-CM | POA: Diagnosis not present

## 2021-01-07 NOTE — Patient Instructions (Signed)
Access Code: [email or web address] URL: [email or web address] Date: 01/07/2021 Prepared by: Daleen Bo  Exercises [place] Bend with Alternating Heel Lift - 1 x daily - 3-4 x weekly - 3 sets - 10 reps Sit to Stand with Resistance Around Legs - 1 x daily - 3-4 x weekly - 5 sets - 5 reps Side Stepping with Resistance at Thighs - 1 x daily - 3-4 x weekly - 2 sets - 1 reps - 57ft hold Step Up - 1 x daily - 3-4 x weekly - 3 sets - 10 reps DNS Bug Heel Touches - 1 x daily - 3-4 x weekly - 2 sets - 10 reps

## 2021-01-07 NOTE — Therapy (Signed)
[facility] [address] [place], [place], [address] Phone: [phone]   Fax:  [phone]  Physical Therapy Treatment  Patient Details  Name: Kathryn Park MRN: [id number] Date of Birth: Sep 22, 1986 Referring Provider (PT): Dr. [name]   Encounter Date: 01/07/2021   PT End of Session - 01/07/21 0845    Visit Number 3    Number of Visits 16    Date for PT Re-Evaluation 02/18/21    Authorization Type BCBS    PT Start Time 0803    PT Stop Time 0845    PT Time Calculation (min) 42 min    Activity Tolerance Patient tolerated treatment well;Patient limited by pain;Patient limited by fatigue           Past Medical History:  Diagnosis Date  . Abnormal Pap smear of cervix   . Allergy   . Anemia    suspected poor diet; vegetarian during teens  . Anxiety   . Asthma    childhood  . Cancer ([place])   . Depression   . Dysmenorrhea   . Family history of breast cancer   . Family history of kidney cancer   . History of iron deficiency anemia    age 35  . Joint pain   . Leukocytosis   . Low vitamin D level   . Migraine with aura    never on rx or saw neurology  . Ovarian cyst    Left  . Pneumonia 01/2012    left lower lobe  . Pre-diabetes 2015   had a "borderline" A1c  . STD (sexually transmitted disease)    HSV1  . Wears contact lenses   . Wears glasses     Past Surgical History:  Procedure Laterality Date  . BREAST BIOPSY Right 04/2020   benign  . COLPOSCOPY    . LAPAROSCOPIC OVARIAN CYSTECTOMY Left 11/13/2019   Procedure: LAPAROSCOPIC OVARIAN CYSTECTOMY/POSSIBLE LEFT OOPHORECTOMY/ COLLECTION OF PELVIC WASHINGS/ks;  Surgeon: Nunzio Cobbs, MD;  Location: [facility];  Service: Gynecology;  Laterality: Left;  Possible left oophorectomy Colection of pelvic washings To follow first case of the day  . LEFT OOPHORECTOMY Left 11/2019  . TONSILLECTOMY    . UPPER GI ENDOSCOPY       There were no vitals filed for this visit.   Subjective Assessment - 01/07/21 0806    Subjective Pt states she has had continual decrease in pain but she had a "bad hip day" on Saturday. This will occur about once a month where hips will hurt and she will experience fatigue as well. She states she is sore for about 24 hours after performing HEP. She reports mixed HEP compliance due to work schedule.    How long can you sit comfortably? 30 mins    How long can you stand comfortably? 30 mins    How long can you walk comfortably? 30 mins    Diagnostic tests Xray- shows potential bilateral pincer lesion    Patient Stated Goals alleviate pain and discomfort and get stronger. Pt wants to return to running.    Currently in Pain? No/denies    Pain Score 0-No pain    Pain Onset More than a month ago                             [facility] Adult PT Treatment/Exercise - 01/07/21 0001      Transfers  Transfers Sit to Stand    Sit to Stand From elevated surface;With upper extremity assist      Ambulation/Gait   Ambulation/Gait Yes    Ambulation/Gait Assistance 7: Independent    Gait Pattern Within Functional Limits      Exercises   Exercises Knee/Hip      Knee/Hip Exercises: Aerobic   Recumbent Bike Level 1 5 min      Knee/Hip Exercises: Standing   Forward Step Up 10 reps;2 sets    Functional Squat Limitations --    Other Standing Knee Exercises fwd step up 2x10 each    Other Standing Knee Exercises hip hinge 10x at wall      Knee/Hip Exercises: Supine   Bridges 2 sets;10 reps    Bridges Limitations with heel lift    Other Supine Knee/Hip Exercises LE 90/90 heel tap 2x10      Knee/Hip Exercises: Sidelying   Hip ADduction 10 reps      Manual Therapy   Manual Therapy Joint mobilization    Manual therapy comments L ant rotation shotgun MET    Joint Mobilization hip inf scoop mob grade III                  PT Education - 01/07/21 0844    Education  Details SIJ anatomy, deconditioning, HEP, expectations with exercise soreness, strength progression    Person(s) Educated Patient    Methods Explanation;Demonstration    Comprehension Verbalized understanding;Returned demonstration            PT Short Term Goals - 12/24/20 0958      PT SHORT TERM GOAL #1   Title Pt will become independent with HEP in order to demonstrate synthesis of PT education.    Time 2    Period Weeks    Status New      PT SHORT TERM GOAL #2   Title Pt will be able to demonstrate full depth squat to floor without pain  in order to demonstrate functional improvement in LE function for self-care and house hold duties.    Time 4    Period Weeks    Status New             PT Long Term Goals - 12/24/20 0959      PT LONG TERM GOAL #1   Title Pt will have a >9pt increase in LEFS in order to demonstrate clinically signficant improvement in LE function for return to full participation with occupation, ADL, and fitness.    Time 6    Period Weeks    Status New      PT LONG TERM GOAL #2   Title Pt will be able to squat and deadlift >45lbs  in order to demonstrate functional improvement in bilat UE function for return to exercise.    Time 6    Period Weeks      PT LONG TERM GOAL #3   Title Pt will be able to perform SL hopping >30s without pain and SL squat from chair in order to demonstrate functional improvement LE function for return to running.    Time 8    Period Weeks    Status New                 Plan - 01/07/21 0846    Clinical Impression Statement Pt had increased hip stiffness at beginning of today's session that was improved with hip joint mobilization and MET technique. Pt was able to progress bridging  exercise as well as show good performance of previously added HEP. Pt does fatigue quickly and has decreased muscular endurance. Pt still required frequent cuing for hip hinging and SL hip stability suggesting decreased motor control with  lumbopelvic disassociation. Pt had moderate difficulty with LE deadbug and required modification to short lever 90/90 heel tapping due to increased lower abdominal and hip flexor fatigue. Plan to progress strengthening to more SL and include adduction into HEP. Pt would benefit from continued skilled therapy in order to maximize bilat LE function and strength for full return to self care, ADL, and exercise.    Personal Factors and Comorbidities Fitness;Comorbidity 1;Time since onset of injury/illness/exacerbation    Examination-Activity Limitations Bathing;Dressing;Sit;Transfers;Bend;Lift;Squat;Carry;Locomotion Level;Stairs;Stand    Examination-Participation Restrictions Occupation;Volunteer;Cleaning;Laundry;Yard Work;Community Activity    Stability/Clinical Decision Making Stable/Uncomplicated    Rehab Potential Excellent    PT Frequency 1x / week   pt to schedule at 1x/week   PT Duration 8 weeks    PT Treatment/Interventions ADLs/Self Care Home Management;Aquatic Therapy;Biofeedback;Electrical Stimulation;Iontophoresis 21m/ml Dexamethasone;Traction;Gait training;Stair training;Functional mobility training;Therapeutic activities;Therapeutic exercise;Balance training;Neuromuscular re-education;Patient/family education;Orthotic Fit/Training;Manual techniques;Passive range of motion;Dry needling;Energy conservation;Taping;Spinal Manipulations;Joint Manipulations    PT Next Visit Plan review HEP, RDL, SLR adduction, bridge with band, deadbug    Consulted and Agree with Plan of Care Patient           Patient will benefit from skilled therapeutic intervention in order to improve the following deficits and impairments:  Decreased endurance,Hypermobility,Increased muscle spasms,Improper body mechanics,Pain,Decreased activity tolerance,Decreased strength  Visit Diagnosis: Bilateral hip pain  Muscle weakness (generalized)     Problem List Patient Active Problem List   Diagnosis Date Noted  .  Bilateral hip pain 11/25/2020  . Positive ANA (antinuclear antibody) 11/25/2020  . Other fatigue 11/25/2020  . Bladder retention 10/13/2020  . HSV-1 (herpes simplex virus 1) infection 10/13/2020  . History of anorexia nervosa 10/13/2020  . GAD (generalized anxiety disorder) 10/13/2020  . Family history of breast cancer   . Family history of kidney cancer   . 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   ADaleen BoPT, DPT 01/07/21 8:57 AM   [place] [address]Phone: [phone]  Fax:  [phone] Name: MAllyssia SkluzacekMRN: [id number] of Birth: 1March 29, 1987

## 2021-01-14 ENCOUNTER — Other Ambulatory Visit: Payer: Self-pay

## 2021-01-14 ENCOUNTER — Ambulatory Visit: Payer: Commercial Managed Care - PPO | Admitting: Physical Therapy

## 2021-01-14 ENCOUNTER — Encounter: Payer: Self-pay | Admitting: Physical Therapy

## 2021-01-14 DIAGNOSIS — M6281 Muscle weakness (generalized): Secondary | ICD-10-CM | POA: Diagnosis not present

## 2021-01-14 DIAGNOSIS — M25552 Pain in left hip: Secondary | ICD-10-CM

## 2021-01-14 DIAGNOSIS — M25551 Pain in right hip: Secondary | ICD-10-CM | POA: Diagnosis not present

## 2021-01-14 NOTE — Therapy (Addendum)
[facility] [address] [place], [place], [address] Phone: [phone]   Fax:  [phone]  Physical Therapy Discharge  Patient Details  Name: Kathryn Park MRN: [id number] Date of Birth: December 30, 1985 Referring Provider (PT): Dr. [name]   Encounter Date: 01/14/2021   PT End of Session - 01/14/21 0840    Visit Number 4    Number of Visits 16    Date for PT Re-Evaluation 02/18/21    Authorization Type BCBS    PT Start Time 0802    PT Stop Time 0843    PT Time Calculation (min) 41 min    Activity Tolerance Patient tolerated treatment well;Patient limited by pain;Patient limited by fatigue           Past Medical History:  Diagnosis Date  . Abnormal Pap smear of cervix   . Allergy   . Anemia    suspected poor diet; vegetarian during teens  . Anxiety   . Asthma    childhood  . Cancer ([place])   . Depression   . Dysmenorrhea   . Family history of breast cancer   . Family history of kidney cancer   . History of iron deficiency anemia    age 49  . Joint pain   . Leukocytosis   . Low vitamin D level   . Migraine with aura    never on rx or saw neurology  . Ovarian cyst    Left  . Pneumonia 01/2012    left lower lobe  . Pre-diabetes 2015   had a "borderline" A1c  . STD (sexually transmitted disease)    HSV1  . Wears contact lenses   . Wears glasses     Past Surgical History:  Procedure Laterality Date  . BREAST BIOPSY Right 04/2020   benign  . COLPOSCOPY    . LAPAROSCOPIC OVARIAN CYSTECTOMY Left 11/13/2019   Procedure: LAPAROSCOPIC OVARIAN CYSTECTOMY/POSSIBLE LEFT OOPHORECTOMY/ COLLECTION OF PELVIC WASHINGS/ks;  Surgeon: Nunzio Cobbs, MD;  Location: [facility];  Service: Gynecology;  Laterality: Left;  Possible left oophorectomy Colection of pelvic washings To follow first case of the day  . LEFT OOPHORECTOMY Left 11/2019  . TONSILLECTOMY    . UPPER GI ENDOSCOPY       There were no vitals filed for this visit.   Subjective Assessment - 01/14/21 0809    Subjective Pt reports she is feeling "more stable." She has decreased frequency and intensity of pain. She feels sore after the first day of HEP but it only lasts for about 1 day.    How long can you sit comfortably? 30 mins    How long can you stand comfortably? 30 mins    How long can you walk comfortably? 30 mins    Diagnostic tests Xray- shows potential bilateral pincer lesion    Patient Stated Goals alleviate pain and discomfort and get stronger. Pt wants to return to running.    Pain Onset More than a month ago                             [facility] Adult PT Treatment/Exercise - 01/14/21 0001      Transfers   Transfers Sit to Stand    Sit to Stand From elevated surface;With upper extremity assist      Ambulation/Gait   Ambulation/Gait Yes    Ambulation/Gait Assistance 7: Independent    Gait Pattern Within  Functional Limits      Exercises   Exercises Knee/Hip      Knee/Hip Exercises: Aerobic   Recumbent Bike Level 1 5 min      Knee/Hip Exercises: Standing   Forward Step Up --    Functional Squat Limitations 2x10 below parallel depth   2nd set with 10 lb goblet squat   Other Standing Knee Exercises runner's step up 2x10 each    Other Standing Knee Exercises hip hinge 10x with RTB pull through      Knee/Hip Exercises: Supine   Bridges 2 sets;10 reps    Bridges Limitations with marching    Other Supine Knee/Hip Exercises LE 90/90 extension 2x10      Knee/Hip Exercises: Sidelying   Hip ABduction 20 reps    Hip ABduction Limitations 3 lbs 2x10                                   PT Education - 01/14/21 0840    Education Details HEP, expectations with exercise soreness, strength progression    Person(s) Educated Patient    Methods Explanation;Demonstration    Comprehension Verbalized understanding;Returned demonstration            PT Short Term  Goals - 12/24/20 0958      PT SHORT TERM GOAL #1   Title Pt will become independent with HEP in order to demonstrate synthesis of PT education.    Time 2    Period Weeks    Status Achieved      PT SHORT TERM GOAL #2   Title Pt will be able to demonstrate full depth squat to floor without pain  in order to demonstrate functional improvement in LE function for self-care and house hold duties.    Time 4    Period Weeks    Status Not met            PT Long Term Goals - 12/24/20 0959      PT LONG TERM GOAL #1   Title Pt will have a >9pt increase in LEFS in order to demonstrate clinically signficant improvement in LE function for return to full participation with occupation, ADL, and fitness.    Time 6    Period Weeks    Status Not met     PT LONG TERM GOAL #2   Title Pt will be able to squat and deadlift >45lbs  in order to demonstrate functional improvement in bilat UE function for return to exercise.    Time 6    Period Not met      PT LONG TERM GOAL #3   Title Pt will be able to perform SL hopping >30s without pain and SL squat from chair in order to demonstrate functional improvement LE function for return to running.    Time 8    Period Weeks    Status Not met                Plan - 01/14/21 0841    Clinical Impression Statement Pt demonstrates improved hip joint ROM and mobility at today's session and did not required manual therapy. Pt was also able to progress to CKC squatting with external resistance as well as no external support. Pt required increased cueing for balance squatting position to decrease excess posterior weight shifting. Pt has improved ability to perform hip hinging and found good success today with external resistance to promote  improved posterior hip movement. Visual and tactile cuing was needed in order to demonstrate proper hip flexion movement. Pt is progressing well with strengthening program and is building/improving lumbopelvic control though  she does continue to show strength and muscular endurance deficits. Pt would benefit from continued skilled therapy in order to reach goals and maximize functional lumbopelvic and stability for full return to PLOF.    Personal Factors and Comorbidities Fitness;Comorbidity 1;Time since onset of injury/illness/exacerbation    Examination-Activity Limitations Bathing;Dressing;Sit;Transfers;Bend;Lift;Squat;Carry;Locomotion Level;Stairs;Stand    Examination-Participation Restrictions Occupation;Volunteer;Cleaning;Laundry;Yard Work;Community Activity    Stability/Clinical Decision Making Stable/Uncomplicated    Rehab Potential Excellent    PT Frequency 1x / week   pt to schedule at 1x/week   PT Duration 8 weeks    PT Treatment/Interventions ADLs/Self Care Home Management;Aquatic Therapy;Biofeedback;Electrical Stimulation;Iontophoresis 40m/ml Dexamethasone;Traction;Gait training;Stair training;Functional mobility training;Therapeutic activities;Therapeutic exercise;Balance training;Neuromuscular re-education;Patient/family education;Orthotic Fit/Training;Manual techniques;Passive range of motion;Dry needling;Energy conservation;Taping;Spinal Manipulations;Joint Manipulations    PT Next Visit Plan review HEP, RDL, SLR adduction, full deadbug, SLR with increased weight, back squat    Consulted and Agree with Plan of Care Patient           Patient will benefit from skilled therapeutic intervention in order to improve the following deficits and impairments:  Decreased endurance,Hypermobility,Increased muscle spasms,Improper body mechanics,Pain,Decreased activity tolerance,Decreased strength  Visit Diagnosis: Bilateral hip pain  Muscle weakness (generalized)     Problem List Patient Active Problem List   Diagnosis Date Noted  . Bilateral hip pain 11/25/2020  . Positive ANA (antinuclear antibody) 11/25/2020  . Other fatigue 11/25/2020  . Bladder retention 10/13/2020  . HSV-1 (herpes simplex virus  1) infection 10/13/2020  . History of anorexia nervosa 10/13/2020  . GAD (generalized anxiety disorder) 10/13/2020  . Family history of breast cancer   . Family history of kidney cancer   . 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   ADaleen BoPT, DPT 01/14/21 9:41 AM   [place] [address]Phone: [phone]  Fax:  [phone] Name: MTyeisha DinanMRN: [id number] of Birth: 1December 14, 1987  PHYSICAL THERAPY DISCHARGE SUMMARY  Visits from Start of Care: 4   Plan:                                                    Patient goals were not met. Patient is being discharged due to not returning since the last visit.  ?????

## 2021-01-14 NOTE — Patient Instructions (Addendum)
Access Code: [email or web address] URL: [email or web address] Date: 01/14/2021 Prepared by: Daleen Bo  Exercises Marching Bridge - 1 x daily - 3-4 x weekly - 3 sets - 10 reps Runner's Step Up/Down - 1 x daily - 3-4 x weekly - 3 sets - 10 reps Squat with Resistance at Thighs - 1 x daily - 3-4 x weekly - 3 sets - 10 reps Side Stepping with Resistance at Thighs - 1 x daily - 3-4 x weekly - 2 sets - 1 reps - 47ft hold DNS Bug Heel Touches - 1 x daily - 3-4 x weekly - 2 sets - 10 reps

## 2021-01-21 ENCOUNTER — Encounter: Payer: BC Managed Care – PPO | Admitting: Physical Therapy

## 2021-01-28 NOTE — Progress Notes (Signed)
   I, Kathryn Park, LAT, ATC, am serving as scribe for Dr. [name].  Kathryn Park is a 35 y.o. female who presents to [facility] today for f/u of B hip pain and mechanical symptoms, R>L.  She was last seen by Dr. [name] on 12/11/20 and was referred to PT of which she's completed 4 visits.  Since her last visit, pt reports that she is doing much better w/ PT, rating her improvement at 50-75%.  She is having much less pain w/ sitting, driving, standing and can typically alleviate any pain by getting up and moving/stretching.  Diagnostic testing: B hip XR- 12/11/20  Pertinent review of systems: No fevers or chills  Relevant historical information: Hypermobility   Exam:  BP 110/70 (BP Location: Right Arm, Patient Position: Sitting, Cuff Size: Normal)   Pulse 85   Ht 5' 7.5" (1.715 m)   Wt 130 lb (59 kg)   SpO2 98%   BMI 20.06 kg/m  General: Well Developed, well nourished, and in no acute distress.   MSK: Left hip normal-appearing Nontender. Hip abduction strength 4/5 external rotation strength 4/5. Right hip normal-appearing Nontender. External rotation strength 4/5 abduction strength 4/5.   Assessment and Plan: 35 y.o. female with hip pain right worse than left thought to be due to hip abductor weakness and tendinopathy.  She does have some anterior hip pain as well which may be due to some impingement.  Overall significantly improved however.  If needed could consider MRI arthrogram however as she is improving I do not think that will be necessary.  Continue out physical therapy and progress to home exercise program.  She may benefit from Pilates in the future.  Recheck back with me as needed.     Discussed warning signs or symptoms. Please see discharge instructions. Patient expresses understanding.   The above documentation has been reviewed and is accurate and complete [name], M.D.  Total encounter time 20 minutes  including face-to-face time with the patient and, reviewing past medical record, and charting on the date of service.   Discussed treatment plan and options going forward

## 2021-01-29 ENCOUNTER — Ambulatory Visit (INDEPENDENT_AMBULATORY_CARE_PROVIDER_SITE_OTHER): Payer: Commercial Managed Care - PPO | Admitting: Family Medicine

## 2021-01-29 ENCOUNTER — Other Ambulatory Visit: Payer: Self-pay

## 2021-01-29 ENCOUNTER — Encounter: Payer: Self-pay | Admitting: Family Medicine

## 2021-01-29 VITALS — BP 110/70 | HR 85 | Ht 67.5 in | Wt 130.0 lb

## 2021-01-29 DIAGNOSIS — M25552 Pain in left hip: Secondary | ICD-10-CM | POA: Diagnosis not present

## 2021-01-29 DIAGNOSIS — M25551 Pain in right hip: Secondary | ICD-10-CM

## 2021-01-29 NOTE — Patient Instructions (Signed)
Thank you for coming in today.  Continue the PT and home exercises.   You may benefit from Pilates in the future following PT.   Recheck with me as needed.

## 2021-02-08 ENCOUNTER — Encounter: Payer: Self-pay | Admitting: Hematology and Oncology

## 2021-02-11 IMAGING — MG DIGITAL DIAGNOSTIC BILAT W/ TOMO W/ CAD
8 series · 9 of 24 positions shown · non-contrast
Comparison: Previous exam(s).

CLINICAL DATA: Patient for short-term follow-up probably benign
right breast asymmetry.

EXAM:
DIGITAL DIAGNOSTIC BILATERAL MAMMOGRAM WITH CAD AND TOMO

[R CC synth-2D]
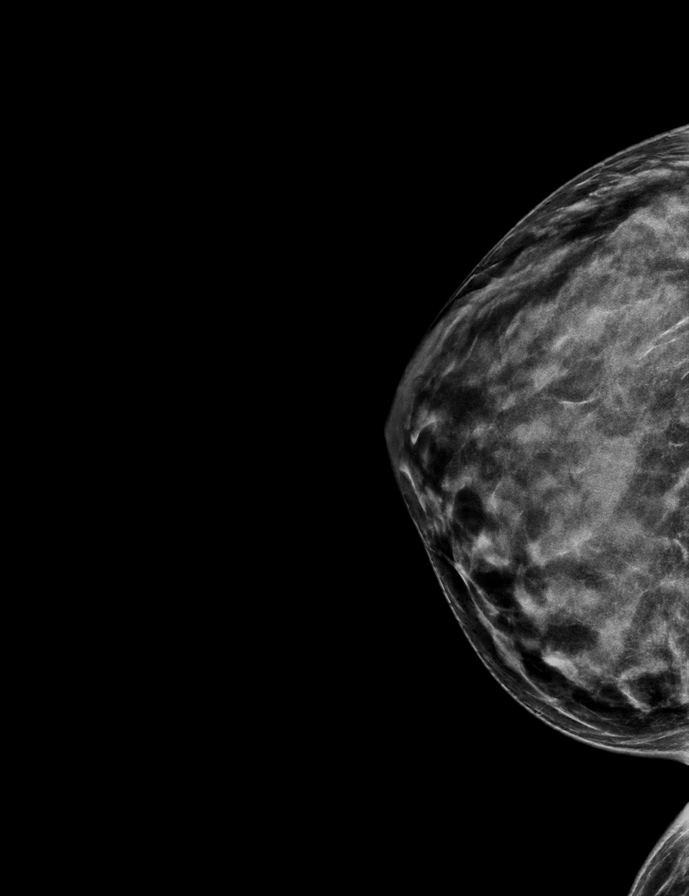

[L MLO synth-2D]
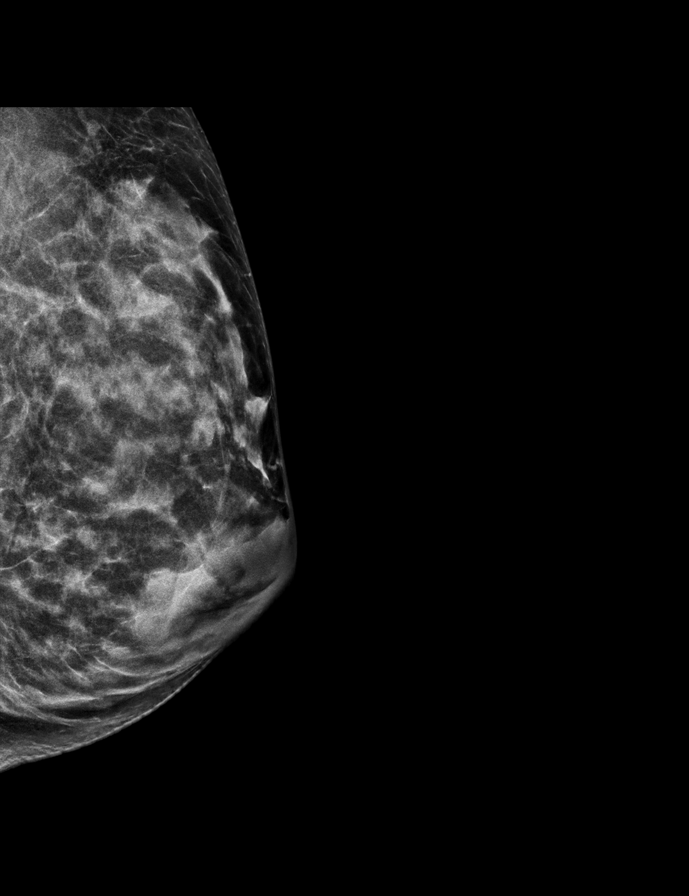

[L CC synth-2D]
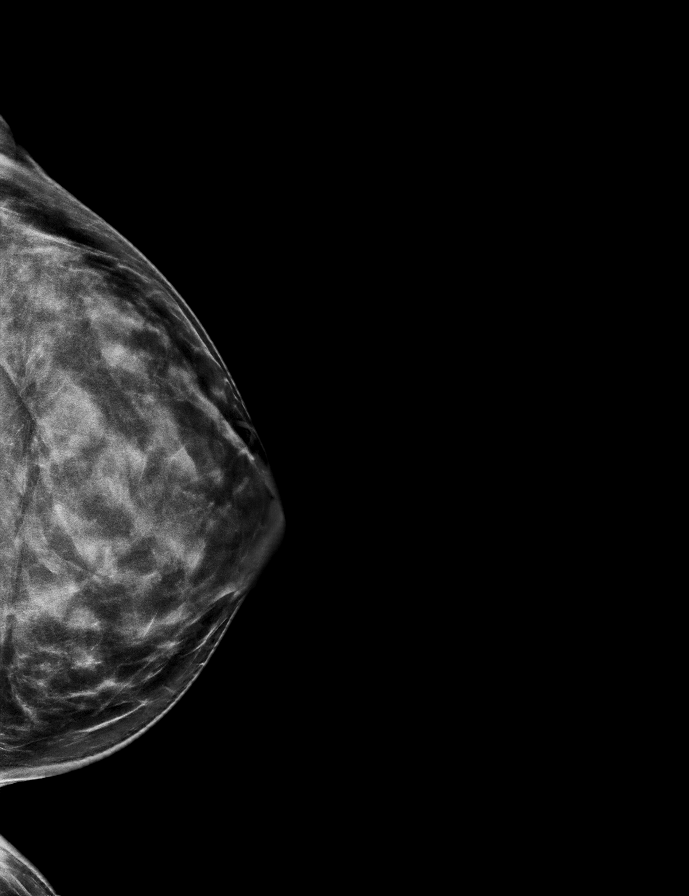

[R MLO synth-2D]
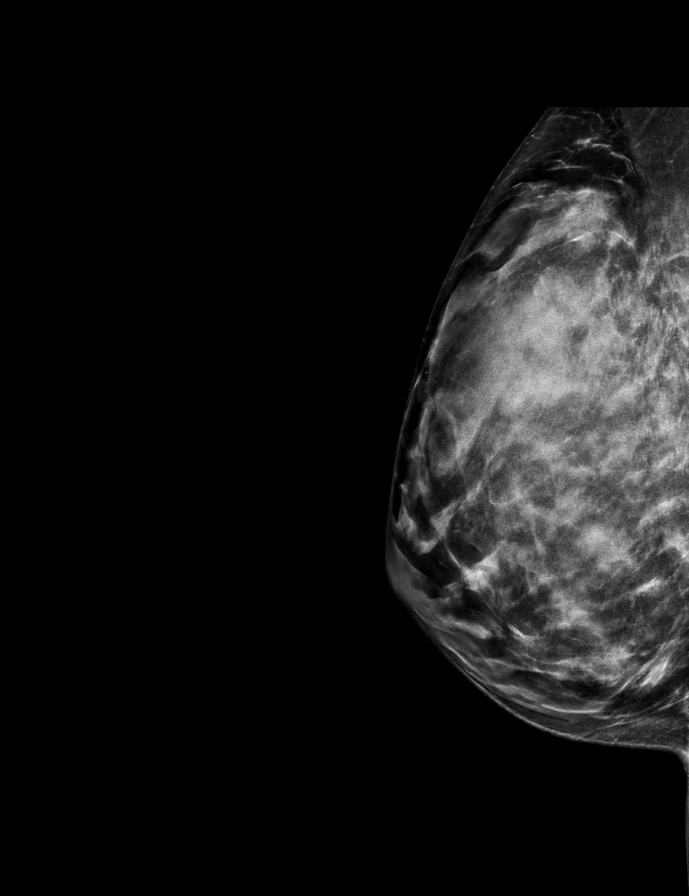

[L MLO tomo · 2 of 52 frames shown]
[frame 17/52]
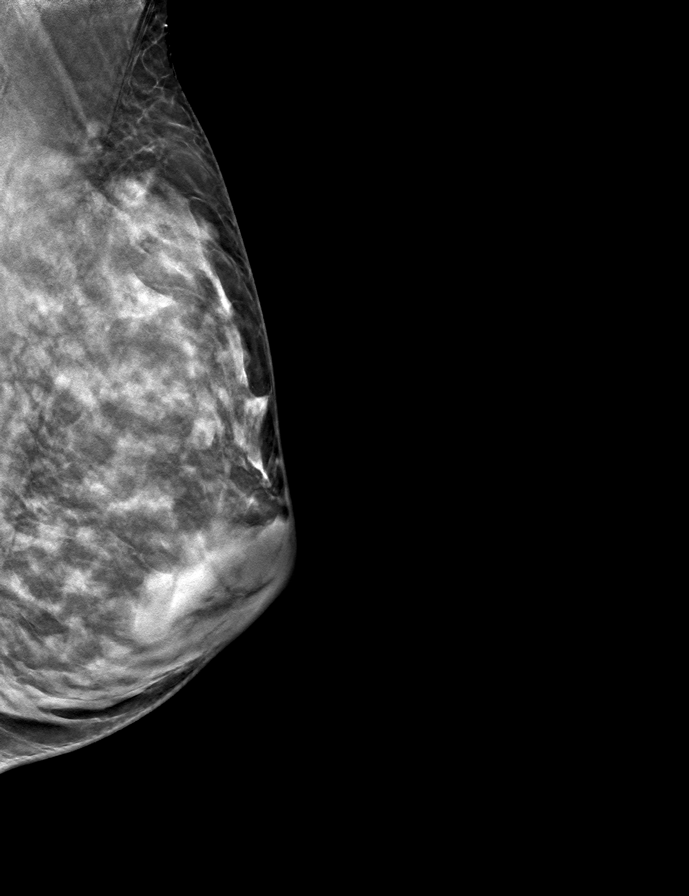
[frame 27/52]
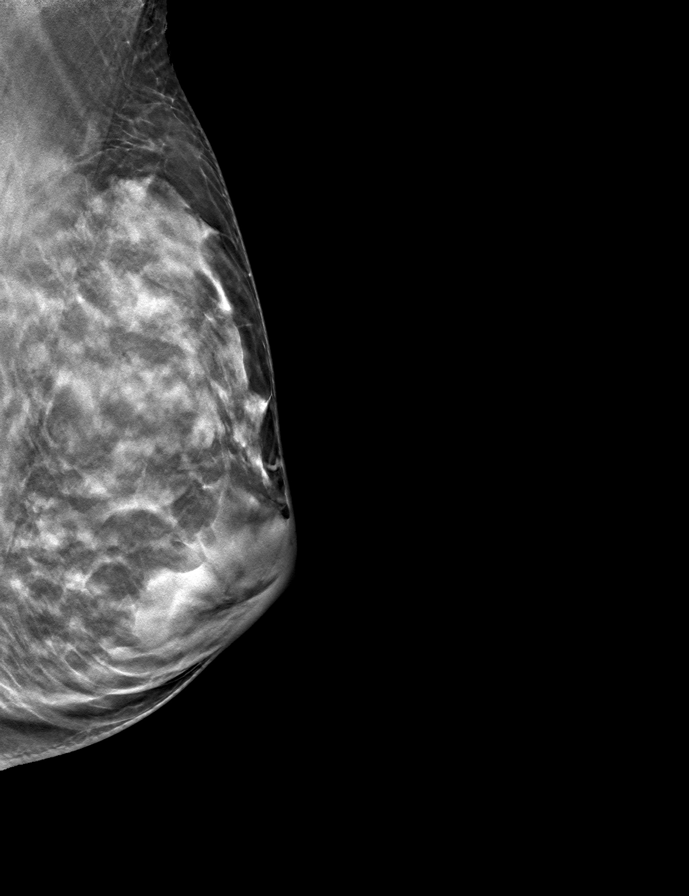

[R CC tomo · tomo slice 30/59.0]
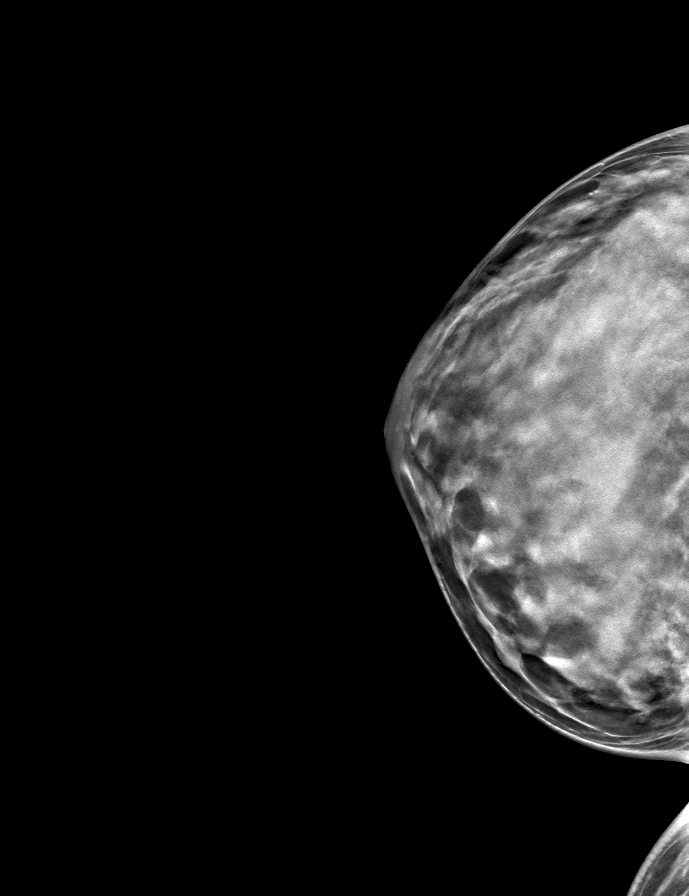

[R MLO tomo · tomo slice 31/61.0]
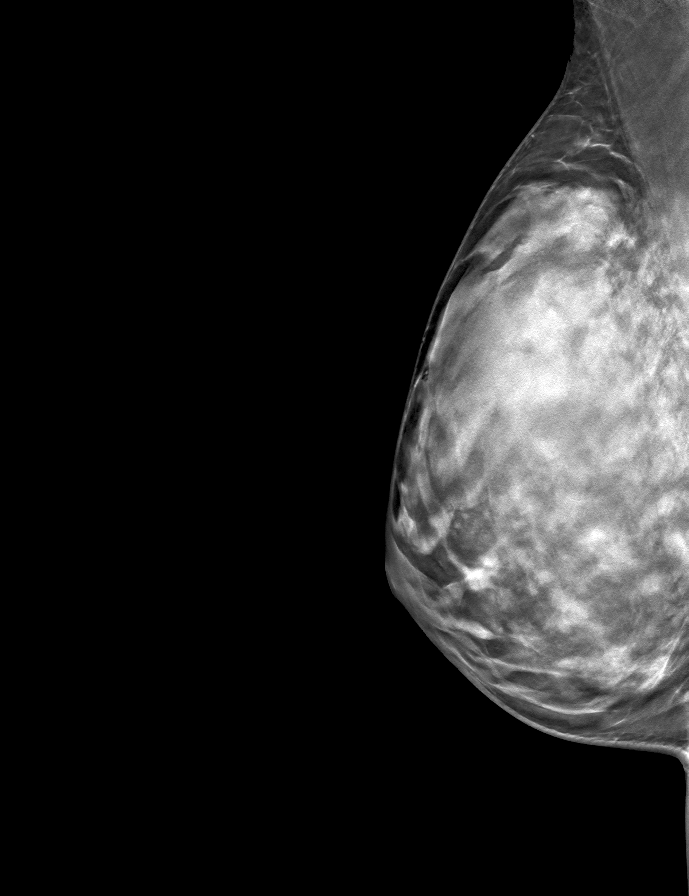

[L CC tomo · tomo slice 35/69.0]
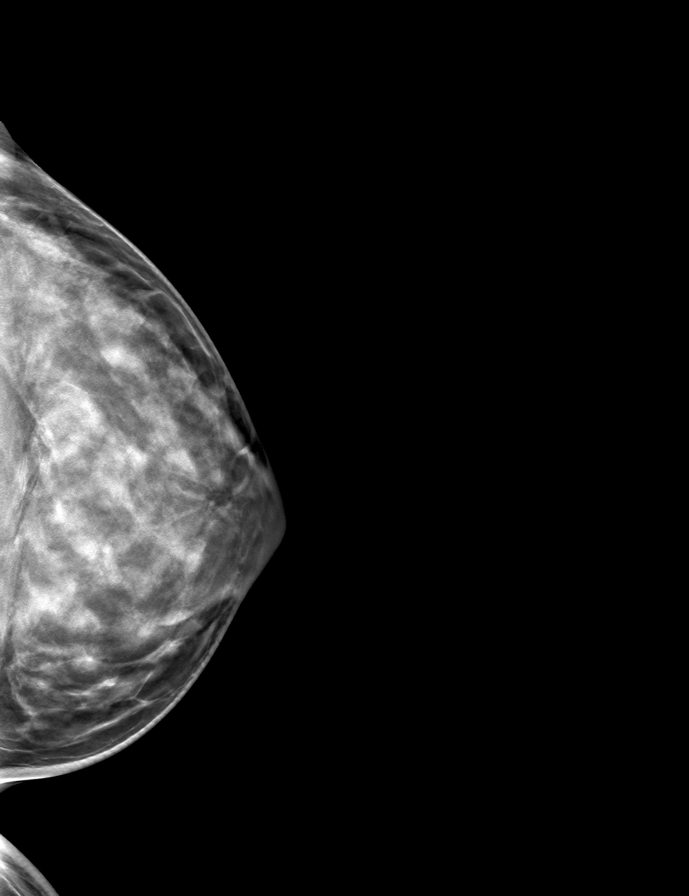

[9 of 24 positions shown; findings below may reference images not displayed]

ACR Breast Density Category d: The breast tissue is extremely dense,
which lowers the sensitivity of mammography.
FINDINGS: Asymmetry within the superior aspect of the right breast on the MLO
view is stable when compared to prior exams favored to represent
dense fibroglandular tissue. No new masses, calcifications or
distortion identified within either breast.

Mammographic images were processed with CAD.
IMPRESSION: Stable probably benign right breast asymmetry.

RECOMMENDATION:
Bilateral diagnostic mammography in 1 year to ensure 2 years of
stability.

I have discussed the findings and recommendations with the patient.
If applicable, a reminder letter will be sent to the patient
regarding the next appointment.

BI-RADS CATEGORY  3: Probably benign.

## 2021-02-12 ENCOUNTER — Encounter: Payer: Self-pay | Admitting: Hematology and Oncology

## 2021-02-12 ENCOUNTER — Other Ambulatory Visit: Payer: Self-pay

## 2021-02-12 ENCOUNTER — Other Ambulatory Visit: Payer: Self-pay | Admitting: *Deleted

## 2021-02-12 ENCOUNTER — Inpatient Hospital Stay: Payer: Commercial Managed Care - PPO | Attending: Gynecologic Oncology | Admitting: Hematology and Oncology

## 2021-02-12 DIAGNOSIS — N63 Unspecified lump in unspecified breast: Secondary | ICD-10-CM

## 2021-02-12 DIAGNOSIS — L700 Acne vulgaris: Secondary | ICD-10-CM | POA: Insufficient documentation

## 2021-02-12 DIAGNOSIS — R599 Enlarged lymph nodes, unspecified: Secondary | ICD-10-CM | POA: Diagnosis not present

## 2021-02-12 DIAGNOSIS — K5900 Constipation, unspecified: Secondary | ICD-10-CM | POA: Insufficient documentation

## 2021-02-12 DIAGNOSIS — Z90710 Acquired absence of both cervix and uterus: Secondary | ICD-10-CM | POA: Diagnosis not present

## 2021-02-12 DIAGNOSIS — D3912 Neoplasm of uncertain behavior of left ovary: Secondary | ICD-10-CM | POA: Diagnosis not present

## 2021-02-12 DIAGNOSIS — R102 Pelvic and perineal pain: Secondary | ICD-10-CM | POA: Diagnosis not present

## 2021-02-12 DIAGNOSIS — R197 Diarrhea, unspecified: Secondary | ICD-10-CM | POA: Diagnosis not present

## 2021-02-12 DIAGNOSIS — M2550 Pain in unspecified joint: Secondary | ICD-10-CM | POA: Insufficient documentation

## 2021-02-12 NOTE — Assessment & Plan Note (Signed)
Patient was previously seen in high risk breast clinic for increased life time risk. She is here for an interim visit with some palpable left axillary lump and increasing right breast changes.  She apparently also felt some swelling in her neck right in front of her left ear. Physical examination today, petite female in no acute distress.  I do not feel any cervical, supraclavicular or infraclavicular lymphadenopathy.  I did feel a left axillary lymph node but it is likely nonpathological, hard to distinguish on exam, it is deep-seated.  No breast changes in the left breast.  Right breast previously had noted upper outer quadrant changes which was thought to be an intramammary lymph node on prior evaluation, according to the patient, this has grown larger.  No right axillary lymphadenopathy.  She is previously scheduled for a mammogram in June, have sent a message to our nurse navigator to have this arranged for sooner date with possible ultrasound of the left axilla for further investigation.  Patient was asked to contact us if she does not hear back from imaging.  She expressed understanding.  Overall concern for an active malignancy is lower.

## 2021-02-12 NOTE — Progress Notes (Signed)
Kathryn Park FOLLOW UP NOTE  Patient Care Team: [name], [place] as PCP - General (Physician Assistant) Lafonda Mosses, MD as Consulting Physician (Gynecologic Oncology)  CHIEF COMPLAINTS/PURPOSE OF CONSULTATION:  Follow up for some breast lump.  ASSESSMENT & PLAN:  Breast lump Patient was previously seen in high risk breast clinic for increased life time risk. She is here for an interim visit with some palpable left axillary lump and increasing right breast changes.  She apparently also felt some swelling in her neck right in front of her left ear. Physical examination today, petite female in no acute distress.  I do not feel any cervical, supraclavicular or infraclavicular lymphadenopathy.  I did feel a left axillary lymph node but it is likely nonpathological, hard to distinguish on exam, it is deep-seated.  No breast changes in the left breast.  Right breast previously had noted upper outer quadrant changes which was thought to be an intramammary lymph node on prior evaluation, according to the patient, this has grown larger.  No right axillary lymphadenopathy.  She is previously scheduled for a mammogram in June, have sent a message to our nurse navigator to have this arranged for sooner date with possible ultrasound of the left axilla for further investigation.  Patient was asked to contact us if she does not hear back from imaging.  She expressed understanding.  Overall concern for an active malignancy is lower.  No orders of the defined types were placed in this encounter.    HISTORY OF PRESENTING ILLNESS:  Kathryn Park 35 y.o. female is here because of High risk for breast cancer  Oncology History  Granulosa cell tumor of left ovary  10/18/2019 Imaging   Pelvic ultrasound: 11 x 7 x 9 cm left ovarian cyst, avascular with thin septation.  No free fluid.  Right ovary normal in appearance.   11/13/2019 Surgery   Laparoscopic left oophorectomy  with collection of pelvic washings, lysis of adhesion.  Findings at the time of surgery were a 12 cm left ovarian cyst.  No ascites or intra-abdominal/pelvic disease.  Ovary was removed in a contained fashion and in an Endo Catch bag.   11/13/2019 Pathology Results   Left ovary showing granulosa cell tumor, 2.1 cm.  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 Pelvic washings negative for malignant cells.   11/13/2019 Initial Diagnosis   Granulosa cell tumor of left ovary   11/29/2019 Imaging   CT A/P: No acute findings in the abdomen or pelvis.  Specifically, no evidence for metastatic disease in the abdomen or pelvis. Trace free fluid in the cul-de-sac.  This can be physiologic in a premenopausal female.   12/27/2019 Tumor Marker   Patient's blood was tested for the following markers: Inh B, AMH Results of the tumor marker test revealed: 83, 1.07.    She was referred to breast clinic given her increased life time risk of breast cancer over 20 % based on TC model.  INTERIM HISTORY  Patient is here for a interim follow up because she felt something in her left arm pit and some breast changes. Her husband apparently felt something below left clavicle. She is understandably worried. Right breast abnormality previously noted has grown bigger according to the patient, She had a bad cold about 3 weeks ago, not COVID No other complaints.  REVIEW OF SYSTEMS:   Constitutional: Denies fevers, chills or abnormal night sweats Eyes: Denies blurriness of vision, double vision or watery eyes Ears,  nose, mouth, throat, and face: Denies mucositis or sore throat Respiratory: Denies cough, dyspnea or wheezes Cardiovascular: Denies palpitation, chest discomfort or lower extremity swelling Gastrointestinal:  Denies nausea, heartburn or change in bowel habits Skin: Denies abnormal skin rashes Lymphatics: Denies new lymphadenopathy or easy bruising Neurological:Denies numbness,  tingling or new weaknesses Behavioral/Psych: Mood is stable, no new changes  All other systems were reviewed with the patient and are negative.   MEDICAL HISTORY:  Past Medical History:  Diagnosis Date  . Abnormal Pap smear of cervix   . Allergy   . Anemia    suspected poor diet; vegetarian during teens  . Anxiety   . Asthma    childhood  . Cancer ([place])   . Depression   . Dysmenorrhea   . Family history of breast cancer   . Family history of kidney cancer   . History of iron deficiency anemia    age 28  . Joint pain   . Leukocytosis   . Low vitamin D level   . Migraine with aura    never on rx or saw neurology  . Ovarian cyst    Left  . Pneumonia 01/2012    left lower lobe  . Pre-diabetes 2015   had a "borderline" A1c  . STD (sexually transmitted disease)    HSV1  . Wears contact lenses   . Wears glasses     SURGICAL HISTORY: Past Surgical History:  Procedure Laterality Date  . BREAST BIOPSY Right 04/2020   benign  . COLPOSCOPY    . LAPAROSCOPIC OVARIAN CYSTECTOMY Left 11/13/2019   Procedure: LAPAROSCOPIC OVARIAN CYSTECTOMY/POSSIBLE LEFT OOPHORECTOMY/ COLLECTION OF PELVIC WASHINGS/ks;  Surgeon: Nunzio Cobbs, MD;  Location: [facility];  Service: Gynecology;  Laterality: Left;  Possible left oophorectomy Colection of pelvic washings To follow first case of the day  . LEFT OOPHORECTOMY Left 11/2019  . TONSILLECTOMY    . UPPER GI ENDOSCOPY      SOCIAL HISTORY: Social History   Socioeconomic History  . Marital status: Married    Spouse name: Not on file  . Number of children: Not on file  . Years of education: Not on file  . Highest education level: Not on file  Occupational History  . Not on file  Tobacco Use  . Smoking status: Former Smoker    Packs/day: 1.00    Types: Cigarettes  . Smokeless tobacco: Never Used  Vaping Use  . Vaping Use: Never used  Substance and Sexual Activity  . Alcohol use: Not Currently  . Drug  use: No  . Sexual activity: Yes    Partners: Male    Birth control/protection: Condom    Comment: everytime  Other Topics Concern  . Not on file  Social History Narrative   Moved from [place]   Married   Social Determinants of Health   Financial Resource Strain: Not on file  Food Insecurity: Not on file  Transportation Needs: Not on file  Physical Activity: Not on file  Stress: Not on file  Social Connections: Not on file  Intimate Partner Violence: Not on file    FAMILY HISTORY: Family History  Problem Relation Age of Onset  . Diabetes Mother   . Hypertension Mother   . Stroke Mother        TIA  . Factor V Leiden deficiency Mother   . Other Mother        pituitary tumor  .  Crohn's disease Father   . Kidney cancer Father        cancerous tumor of kidney  . Liver disease Father   . Breast cancer Maternal Grandmother        dx. 79s  . Stroke Maternal Grandfather   . Other Brother        ear tumor, dx. age 44-6  . Rheum arthritis Maternal Great-grandmother   . Cancer Other   . Ovarian cancer Neg Hx   . Endometrial cancer Neg Hx   . Colon cancer Neg Hx     ALLERGIES:  is allergic to shellfish allergy, other, latex, and sulfa antibiotics.  MEDICATIONS:  Current Outpatient Medications  Medication Sig Dispense Refill  . busPIRone (BUSPAR) 5 MG tablet Take 1 tablet (5 mg total) by mouth 3 (three) times daily as needed (anxiety). 90 tablet 1  . Fexofenadine-Pseudoephedrine (ALLEGRA-D 24 HOUR PO) Take 1 tablet by mouth as needed.    . Multiple Vitamins-Minerals (MULTIPLE VITAMINS/WOMENS PO) Take 1 tablet by mouth daily in the afternoon.    . valACYclovir (VALTREX) 1000 MG tablet Take 2 tablets (2000 mg) by mouth twice a day for 24 hours. (Patient taking differently: as needed. Take 2 tablets (2000 mg) by mouth twice a day for 24 hours as needed for cold sore) 30 tablet 1   No current facility-administered medications for this visit.     PHYSICAL EXAMINATION: ECOG PERFORMANCE STATUS: 0 - Asymptomatic  Vitals:   02/12/21 0950  BP: 125/84  Pulse: 78  Resp: 18  Temp: 98 F (36.7 C)  SpO2: 100%   Filed Weights   02/12/21 0950  Weight: 130 lb 6.4 oz (59.1 kg)    GENERAL:alert, no distress and comfortable Focused Breast exam: Bilateral breasts appear normal to inspection. No palpable cervical or pre auricular LN. No infraclavicular LN. Left axilla LN palpable, but hard to tell if its pathologic. No breast masses or skin changes left breast Right breast upper outer quadrant had a lump which according to patient has gotten bigger No right axillary LN.  LABORATORY DATA:  I have reviewed the data as listed Lab Results  Component Value Date   WBC 7.8 10/13/2020   HGB 13.6 10/13/2020   HCT 40.5 10/13/2020   MCV 87.4 10/13/2020   PLT 218.0 10/13/2020     Chemistry      Component Value Date/Time   NA 138 10/13/2020 1415   NA 139 10/11/2019 1013   K 3.7 10/13/2020 1415   CL 103 10/13/2020 1415   CO2 31 10/13/2020 1415   BUN 10 10/13/2020 1415   BUN 12 10/11/2019 1013   CREATININE 0.70 10/13/2020 1415   CREATININE 0.75 09/04/2020 1150      Component Value Date/Time   CALCIUM 9.8 10/13/2020 1415   ALKPHOS 47 10/13/2020 1415   AST 13 10/13/2020 1415   AST 11 (L) 09/04/2020 1150   ALT 12 10/13/2020 1415   ALT 8 09/04/2020 1150   BILITOT 0.4 10/13/2020 1415   BILITOT 0.3 09/04/2020 1150      RADIOGRAPHIC STUDIES: I have personally reviewed the radiological images as listed and agreed with the findings in the report. No results found.  All questions were answered. The patient knows to call the clinic with any problems, questions or concerns.    Kathryn Pike, MD 02/12/2021 10:31 AM

## 2021-02-13 ENCOUNTER — Encounter: Payer: Self-pay | Admitting: Gynecologic Oncology

## 2021-02-13 ENCOUNTER — Other Ambulatory Visit: Payer: Self-pay

## 2021-02-13 ENCOUNTER — Ambulatory Visit (HOSPITAL_COMMUNITY): Payer: BC Managed Care – PPO

## 2021-02-13 ENCOUNTER — Inpatient Hospital Stay: Payer: Commercial Managed Care - PPO | Admitting: Gynecologic Oncology

## 2021-02-13 ENCOUNTER — Inpatient Hospital Stay: Payer: Commercial Managed Care - PPO

## 2021-02-13 ENCOUNTER — Ambulatory Visit (HOSPITAL_COMMUNITY)
Admission: RE | Admit: 2021-02-13 | Discharge: 2021-02-13 | Disposition: A | Discharge status: Home / Self Care | Payer: Commercial Managed Care - PPO | Source: Ambulatory Visit | Attending: Gynecologic Oncology | Admitting: Gynecologic Oncology

## 2021-02-13 VITALS — BP 125/84 | HR 77 | Temp 97.5°F | Resp 16 | Ht 67.5 in | Wt 127.5 lb

## 2021-02-13 DIAGNOSIS — D3912 Neoplasm of uncertain behavior of left ovary: Secondary | ICD-10-CM | POA: Insufficient documentation

## 2021-02-13 NOTE — Patient Instructions (Signed)
It was great to see you today!  Glad you are feeling better.  Everything on your exam and your ultrasound today is very reassuring.  I will release your lab tests when they are back in my chart.  I will see you back in 6 months and we will repeat an ultrasound and lab work at that time.  If anything changes before then, such as you start having pelvic pain, changes to your menstrual cycle, unintentional weight loss, please call to see me sooner at [phone].

## 2021-02-13 NOTE — Progress Notes (Signed)
Gynecologic Oncology Return Clinic Visit  02/13/2021  Reason for Visit: surveillance in the setting of GCT of the ovary  Treatment History: Oncology History  Granulosa cell tumor of left ovary  10/18/2019 Imaging   Pelvic ultrasound: 11 x 7 x 9 cm left ovarian cyst, avascular with thin septation.  No free fluid.  Right ovary normal in appearance.   11/13/2019 Surgery   Laparoscopic left oophorectomy with collection of pelvic washings, lysis of adhesion.  Findings at the time of surgery were a 12 cm left ovarian cyst.  No ascites or intra-abdominal/pelvic disease.  Ovary was removed in a contained fashion and in an Endo Catch bag.   11/13/2019 Pathology Results   Left ovary showing granulosa cell tumor, 2.1 cm.  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 Pelvic washings negative for malignant cells.   11/13/2019 Initial Diagnosis   Granulosa cell tumor of left ovary   11/29/2019 Imaging   CT A/P: No acute findings in the abdomen or pelvis.  Specifically, no evidence for metastatic disease in the abdomen or pelvis. Trace free fluid in the cul-de-sac.  This can be physiologic in a premenopausal female.   12/27/2019 Tumor Marker   Patient's blood was tested for the following markers: Inh B, AMH Results of the tumor marker test revealed: 83, 1.07.     Interval History: Patient presents today for follow-up in the setting of granulosa tumor of the ovary.  She notes overall doing well.  She has occasional right-sided pelvic pain that she describes as radiating down her leg.  Otherwise she denies abdominal or pelvic pain.  She has had some change to her menses that now has been the same for her last 3 cycles.  Her menses have been shorter than normal lasting 4 days with 2 very heavy days in the middle with passage of clots.  Her cramping is overall improved from previous.  She endorses a good appetite without any nausea or emesis.  She has been able to gain some weight  and is working with a Microbiologist.  She is keeping better track of what she is eating and her weight now is between 125 and 130.  As she has gained weight, she has noticed that her fatigue has also improved.  Given her joint pain, she was referred to a sports medicine physician and ultimately was referred to physical therapy.  She has found this helpful and is having less joint pain.  Feels as though she is having some hormonal changes secondary to recent cystic acne on her neck, back, and chin, low sex drive, and feeling emotional and crying more frequently.  She also describes having some cramping with orgasm.  She and her partner now both live full-time in [place].  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  Past Medical/Surgical History: Past Medical History:  Diagnosis Date  . Abnormal Pap smear of cervix   . Allergy   . Anemia    suspected poor diet; vegetarian during teens  . Anxiety   . Asthma    childhood  . Cancer ([place])   . Depression   . Dysmenorrhea   . Family history of breast cancer   . Family history of kidney cancer   . History of iron deficiency anemia    age 68  . Joint pain   . Leukocytosis   . Low vitamin D level   . Migraine with aura    never on rx or saw neurology  .  Ovarian cyst    Left  . Pneumonia 01/2012    left lower lobe  . Pre-diabetes 2015   had a "borderline" A1c  . STD (sexually transmitted disease)    HSV1  . Wears contact lenses   . Wears glasses     Past Surgical History:  Procedure Laterality Date  . BREAST BIOPSY Right 04/2020   benign  . COLPOSCOPY    . LAPAROSCOPIC OVARIAN CYSTECTOMY Left 11/13/2019   Procedure: LAPAROSCOPIC OVARIAN CYSTECTOMY/POSSIBLE LEFT OOPHORECTOMY/ COLLECTION OF PELVIC WASHINGS/ks;  Surgeon: Nunzio Cobbs, MD;  Location: [facility];  Service: Gynecology;  Laterality: Left;  Possible left oophorectomy Colection of pelvic washings To follow first case of the day   . LEFT OOPHORECTOMY Left 11/2019  . TONSILLECTOMY    . UPPER GI ENDOSCOPY      Family History  Problem Relation Age of Onset  . Diabetes Mother   . Hypertension Mother   . Stroke Mother        TIA  . Factor V Leiden deficiency Mother   . Other Mother        pituitary tumor  . Crohn's disease Father   . Kidney cancer Father        cancerous tumor of kidney  . Liver disease Father   . Breast cancer Maternal Grandmother        dx. 36s  . Stroke Maternal Grandfather   . Other Brother        ear tumor, dx. age 74-6  . Rheum arthritis Maternal Great-grandmother   . Cancer Other   . Ovarian cancer Neg Hx   . Endometrial cancer Neg Hx   . Colon cancer Neg Hx     Social History   Socioeconomic History  . Marital status: Married    Spouse name: Not on file  . Number of children: Not on file  . Years of education: Not on file  . Highest education level: Not on file  Occupational History  . Not on file  Tobacco Use  . Smoking status: Former Smoker    Packs/day: 1.00    Types: Cigarettes  . Smokeless tobacco: Never Used  Vaping Use  . Vaping Use: Never used  Substance and Sexual Activity  . Alcohol use: Not Currently  . Drug use: No  . Sexual activity: Yes    Partners: Male    Birth control/protection: Condom    Comment: everytime  Other Topics Concern  . Not on file  Social History Narrative   Moved from [place]   Married   Social Determinants of Health   Financial Resource Strain: Not on file  Food Insecurity: Not on file  Transportation Needs: Not on file  Physical Activity: Not on file  Stress: Not on file  Social Connections: Not on file    Current Medications:  Current Outpatient Medications:  .  busPIRone (BUSPAR) 5 MG tablet, Take 1 tablet (5 mg total) by mouth 3 (three) times daily as needed (anxiety)., Disp: 90 tablet, Rfl: 1 .  Multiple Vitamins-Minerals (MULTIPLE VITAMINS/WOMENS PO), Take 1 tablet by mouth  daily in the afternoon., Disp: , Rfl:  .  valACYclovir (VALTREX) 1000 MG tablet, Take 2 tablets (2000 mg) by mouth twice a day for 24 hours. (Patient taking differently: as needed. Take 2 tablets (2000 mg) by mouth twice a day for 24 hours as needed for cold sore), Disp: 30 tablet, Rfl: 1 .  Fexofenadine-Pseudoephedrine (  ALLEGRA-D 24 HOUR PO), Take 1 tablet by mouth as needed. (Patient not taking: Reported on 02/13/2021), Disp: , Rfl:   Review of Systems: + Cystic acne on neck, back, and chin, constipation, diarrhea, urinary frequency, pelvic pain, swollen glands and lymph nodes. Denies appetite changes, fevers, chills, fatigue, unexplained weight changes. Denies hearing loss, neck lumps or masses, mouth sores, ringing in ears or voice changes. Denies cough or wheezing.  Denies shortness of breath. Denies chest pain or palpitations. Denies leg swelling. Denies abdominal distention, pain, blood in stools, ausea, vomiting, or early satiety. Denies pain with intercourse, dysuria, hematuria or incontinence. Denies hot flashes, vaginal bleeding or vaginal discharge.   Denies joint pain, back pain or muscle pain/cramps. Denies itching, rash, or wounds. Denies dizziness, headaches, numbness or seizures. Denies easy bruising or bleeding. Denies anxiety, depression, confusion, or decreased concentration.  Physical Exam: BP 125/84 (BP Location: Right Arm, Patient Position: Sitting)   Pulse 77   Temp (!) 97.5 F (36.4 C) (Tympanic)   Resp 16   Ht 5' 7.5" (1.715 m)   Wt 127 lb 8 oz (57.8 kg)   SpO2 98%   BMI 19.67 kg/m  General: Alert, oriented, no acute distress. HEENT: Atraumatic, normocephalic, sclera anicteric. Chest: Unlabored breathing on room air. Abdomen: soft, nontender.  Normoactive bowel sounds.  No masses or hepatosplenomegaly appreciated.  Well-healed incisions. Extremities: Grossly normal range of motion.  Warm, well perfused.  No edema bilaterally. Skin: No rashes or lesions  noted. Lymphatics: No cervical, supraclavicular, or inguinal adenopathy. GU: Normal appearing external genitalia without erythema, excoriation, or lesions.  Speculum exam reveals well rugated vaginal mucosa, cervix normal-appearing and posterior facing no lesions or masses.  Bimanual exam reveals small mobile uterus, no adnexal masses, no nodularity.    Laboratory & Radiologic Studies: Pelvic ultrasound on 02/13/2021: FINDINGS: Uterus  Measurements: 8.9 x 4.6 x 5.7 cm = volume: 124 mL. Anteverted. Normal morphology without mass  Endometrium  Thickness: 8 mm.  No endometrial fluid or focal abnormality.  Right ovary  Measurements: 3.3 x 1.8 x 3.1 cm = volume: 9.7 mL. Normal morphology without mass. Blood flow present within RIGHT ovary on color Doppler imaging. The  Left ovary  Surgically absent  Other findings  No free pelvic fluid.  No adnexal masses.  IMPRESSION: Post LEFT oophorectomy.  Otherwise normal exam.  Small cystic focus within LEFT adnexa on previous exam no longer identified.  Assessment & Plan: Kathryn Park is a 35 y.o. 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  [place] is NED on exam today and ultrasound is overall very reassuring without abnormality seen.  She has now established with a primary care provider here in [place] and has began working with a Microbiologist.  She has been able to gain some weight back.  Additionally, she is doing physical therapy and is have improvement in her joint pain.  We reviewed signs and symptoms again that would be concerning for disease recurrence.  I will release her inhibin B and anti-mllerian hormone levels when they result.  Per [facility] surveillance recommendations, we will continue on visits every 6 months.  She knows to call if she develops any symptoms before her next scheduled visit.  I will see her next in November.  28 minutes of total time  was spent for this patient encounter, including preparation, face-to-face counseling with the patient and coordination of care, and documentation of the encounter.  Jeral Pinch, MD  Division  of Gynecologic Oncology  Department of Obstetrics and Gynecology  [facility]

## 2021-02-16 ENCOUNTER — Other Ambulatory Visit: Payer: Self-pay | Admitting: Hematology and Oncology

## 2021-02-16 DIAGNOSIS — N6489 Other specified disorders of breast: Secondary | ICD-10-CM

## 2021-02-17 LAB — INHIBIN B: Inhibin B: 48 pg/mL

## 2021-02-19 ENCOUNTER — Ambulatory Visit
Admission: RE | Admit: 2021-02-19 | Discharge: 2021-02-19 | Disposition: A | Discharge status: Home / Self Care | Payer: Commercial Managed Care - PPO | Source: Ambulatory Visit | Attending: Hematology and Oncology | Admitting: Hematology and Oncology

## 2021-02-19 ENCOUNTER — Other Ambulatory Visit: Payer: Self-pay | Admitting: Hematology and Oncology

## 2021-02-19 ENCOUNTER — Other Ambulatory Visit: Payer: Self-pay

## 2021-02-19 DIAGNOSIS — N6489 Other specified disorders of breast: Secondary | ICD-10-CM

## 2021-02-19 DIAGNOSIS — Z803 Family history of malignant neoplasm of breast: Secondary | ICD-10-CM

## 2021-02-19 DIAGNOSIS — R2232 Localized swelling, mass and lump, left upper limb: Secondary | ICD-10-CM

## 2021-02-20 LAB — ANTI MULLERIAN HORMONE: ANTI-MULLERIAN HORMONE (AMH): 2.57 ng/mL

## 2021-03-04 ENCOUNTER — Other Ambulatory Visit: Payer: BC Managed Care – PPO

## 2021-04-16 ENCOUNTER — Encounter: Payer: Self-pay | Admitting: Hematology and Oncology

## 2021-04-16 ENCOUNTER — Inpatient Hospital Stay: Payer: Commercial Managed Care - PPO | Attending: Gynecologic Oncology | Admitting: Hematology and Oncology

## 2021-04-16 ENCOUNTER — Other Ambulatory Visit: Payer: Self-pay

## 2021-04-16 VITALS — BP 112/79 | HR 80 | Temp 98.1°F | Resp 18 | Wt 131.1 lb

## 2021-04-16 DIAGNOSIS — Z9189 Other specified personal risk factors, not elsewhere classified: Secondary | ICD-10-CM | POA: Diagnosis not present

## 2021-04-16 DIAGNOSIS — D3912 Neoplasm of uncertain behavior of left ovary: Secondary | ICD-10-CM | POA: Diagnosis present

## 2021-04-16 DIAGNOSIS — R590 Localized enlarged lymph nodes: Secondary | ICD-10-CM

## 2021-04-16 DIAGNOSIS — Z1501 Genetic susceptibility to malignant neoplasm of breast: Secondary | ICD-10-CM | POA: Diagnosis not present

## 2021-04-16 NOTE — Progress Notes (Signed)
[place] FOLLOW UP NOTE  Patient Care Team: [name], [place] as PCP - General (Physician Assistant) Lafonda Mosses, MD as Consulting Physician (Gynecologic Oncology)  CHIEF COMPLAINTS/PURPOSE OF CONSULTATION:  Follow up for some breast lump.  ASSESSMENT & PLAN:  No problem-specific Assessment & Plan notes found for this encounter.  Orders Placed This Encounter  Procedures   MR BREAST BILATERAL W WO CONTRAST INC CAD    Standing Status:   Future    Standing Expiration Date:   04/16/2022    Order Specific Question:   If indicated for the ordered procedure, I authorize the administration of contrast media per Radiology protocol    Answer:   Yes    Order Specific Question:   What is the patient's sedation requirement?    Answer:   No Sedation    Order Specific Question:   Does the patient have a pacemaker or implanted devices?    Answer:   No    Order Specific Question:   Preferred imaging location?    Answer:   [facility] (table limit-61lbs)    35 year old pleasant female patient with a past medical history significant for granulosa cell ovarian carcinoma status post left oophorectomy referred to high risk breast clinic.   As reviewed below, her 5-year risk of breast cancer by Gail's model is 0.6% and hence she does not qualify for tamoxifen prevention. Her lifetime risk of breast cancer per Tyer Cusick model is 23% and hence I agree with MRI annually alternating with mammogram for breast cancer screening.  I discussed that long-term risks of gadolinium deposition in the brain are not completely known at this time.  She agrees to proceed with annual MRIs knowing the above-mentioned risks. Last MRI is in December 2021 which was unremarkable. Mammogram done in May 2022 showed no evidence of malignancy within either breast, stable benign intramammary lymph node within the right breast at 10:00 axis corresponding to palpable area of concern, no evidence of  malignancy or acute findings within the left axilla corresponding to the second palpable area of concern. She is here for FU she is doing very well today, does report some intermittent changes in the breast especially hard to report since her breast changed with every menstrual cycle.  Physical examination today bilateral breasts inspected, no concern for palpable lumps or nipple discharge or regional lymphadenopathy.  Right breast upper outer quadrant with intramammary lymph node which has been examined a few times and was thought to be a benign intramammary lymph node. At this time there is no concern for an active breast malignancy.  I have ordered a repeat MRI given her lifetime risk of breast cancer, she is due for this in December 2022.  She was given phone numbers to call [facility] imaging. She will return to clinic in about 6 months for follow-up, encouraged to contact us sooner with any new questions or concerns.   HISTORY OF PRESENTING ILLNESS:  Kathryn Park 35 y.o. female is here because of High risk for breast cancer  Oncology History  Granulosa cell tumor of left ovary  10/18/2019 Imaging   Pelvic ultrasound: 11 x 7 x 9 cm left ovarian cyst, avascular with thin septation.  No free fluid.  Right ovary normal in appearance.   11/13/2019 Surgery   Laparoscopic left oophorectomy with collection of pelvic washings, lysis of adhesion.  Findings at the time of surgery were a 12 cm left ovarian cyst.  No ascites or intra-abdominal/pelvic  disease.  Ovary was removed in a contained fashion and in an Endo Catch bag.   11/13/2019 Pathology Results   Left ovary showing granulosa cell tumor, 2.1 cm.  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 Pelvic washings negative for malignant cells.   11/13/2019 Initial Diagnosis   Granulosa cell tumor of left ovary   11/29/2019 Imaging   CT A/P: No acute findings in the abdomen or pelvis.  Specifically, no  evidence for metastatic disease in the abdomen or pelvis. Trace free fluid in the cul-de-sac.  This can be physiologic in a premenopausal female.   12/27/2019 Tumor Marker   Patient's blood was tested for the following markers: Inh B, AMH Results of the tumor marker test revealed: 83, 1.07.    She was referred to breast clinic given her increased life time risk of breast cancer over 20 % based on TC model.  INTERIM HISTORY  Patient is here for a follow-up by herself.  Since last visit, she has been doing well healthwise but again it is very hard for her to comment on what has been changing her breast because her breast changed with every menstrual cycle.  She still feels very small lymph node in her upper neck, the right breast intramammary lymph node but no other major lumps. She had her mammogram in May 2022.  She is obviously very stressed given some summer camp going on but otherwise feels well.  Rest of the pertinent review of systems reviewed and negative.  REVIEW OF SYSTEMS:   Constitutional: Denies fevers, chills or abnormal night sweats Eyes: Denies blurriness of vision, double vision or watery eyes Ears, nose, mouth, throat, and face: Denies mucositis or sore throat Respiratory: Denies cough, dyspnea or wheezes Cardiovascular: Denies palpitation, chest discomfort or lower extremity swelling Gastrointestinal:  Denies nausea, heartburn or change in bowel habits Skin: Denies abnormal skin rashes Lymphatics: Denies new lymphadenopathy or easy bruising Neurological:Denies numbness, tingling or new weaknesses Behavioral/Psych: Mood is stable, no new changes  All other systems were reviewed with the patient and are negative.   MEDICAL HISTORY:  Past Medical History:  Diagnosis Date   Abnormal Pap smear of cervix    Allergy    Anemia    suspected poor diet; vegetarian during teens   Anxiety    Asthma    childhood   Cancer ([place])    Depression    Dysmenorrhea    Family  history of breast cancer    Family history of kidney cancer    History of iron deficiency anemia    age 1   Joint pain    Leukocytosis    Low vitamin D level    Migraine with aura    never on rx or saw neurology   Ovarian cyst    Left   Pneumonia 01/2012    left lower lobe   Pre-diabetes 2015   had a "borderline" A1c   STD (sexually transmitted disease)    HSV1   Wears contact lenses    Wears glasses     SURGICAL HISTORY: Past Surgical History:  Procedure Laterality Date   BREAST BIOPSY Right 04/2020   benign   COLPOSCOPY     LAPAROSCOPIC OVARIAN CYSTECTOMY Left 11/13/2019   Procedure: LAPAROSCOPIC OVARIAN CYSTECTOMY/POSSIBLE LEFT OOPHORECTOMY/ COLLECTION OF PELVIC WASHINGS/ks;  Surgeon: Nunzio Cobbs, MD;  Location: [facility];  Service: Gynecology;  Laterality: Left;  Possible left oophorectomy Colection of pelvic washings To follow first case  of the day   LEFT OOPHORECTOMY Left 11/2019   TONSILLECTOMY     UPPER GI ENDOSCOPY      SOCIAL HISTORY: Social History   Socioeconomic History   Marital status: Married    Spouse name: Not on file   Number of children: Not on file   Years of education: Not on file   Highest education level: Not on file  Occupational History   Not on file  Tobacco Use   Smoking status: Former    Packs/day: 1.00    Types: Cigarettes   Smokeless tobacco: Never  Vaping Use   Vaping Use: Never used  Substance and Sexual Activity   Alcohol use: Not Currently   Drug use: No   Sexual activity: Yes    Partners: Male    Birth control/protection: Condom    Comment: everytime  Other Topics Concern   Not on file  Social History Narrative   Moved from [place]   Married   Social Determinants of Health   Financial Resource Strain: Not on file  Food Insecurity: Not on file  Transportation Needs: Not on file  Physical Activity: Not on file  Stress: Not on file  Social  Connections: Not on file  Intimate Partner Violence: Not on file    FAMILY HISTORY: Family History  Problem Relation Age of Onset   Diabetes Mother    Hypertension Mother    Stroke Mother        TIA   Factor V Leiden deficiency Mother    Other Mother        pituitary tumor   Crohn's disease Father    Kidney cancer Father        cancerous tumor of kidney   Liver disease Father    Breast cancer Maternal Grandmother        dx. 32s   Stroke Maternal Grandfather    Other Brother        ear tumor, dx. age 24-6   Rheum arthritis Maternal Great-grandmother    Cancer Other    Ovarian cancer Neg Hx    Endometrial cancer Neg Hx    Colon cancer Neg Hx     ALLERGIES:  is allergic to shellfish allergy, other, latex, and sulfa antibiotics.  MEDICATIONS:  Current Outpatient Medications  Medication Sig Dispense Refill   busPIRone (BUSPAR) 5 MG tablet Take 1 tablet (5 mg total) by mouth 3 (three) times daily as needed (anxiety). 90 tablet 1   Multiple Vitamins-Minerals (MULTIPLE VITAMINS/WOMENS PO) Take 1 tablet by mouth daily in the afternoon.     Fexofenadine-Pseudoephedrine (ALLEGRA-D 24 HOUR PO) Take 1 tablet by mouth as needed. (Patient not taking: Reported on 02/13/2021)     valACYclovir (VALTREX) 1000 MG tablet Take 2 tablets (2000 mg) by mouth twice a day for 24 hours. (Patient taking differently: as needed. Take 2 tablets (2000 mg) by mouth twice a day for 24 hours as needed for cold sore) 30 tablet 1   No current facility-administered medications for this visit.    PHYSICAL EXAMINATION: ECOG PERFORMANCE STATUS: 0 - Asymptomatic  Vitals:   04/16/21 0851  BP: 112/79  Pulse: 80  Resp: 18  Temp: 98.1 F (36.7 C)  SpO2: 100%    Filed Weights   04/16/21 0851  Weight: 131 lb 2 oz (59.5 kg)     GENERAL:alert, no distress and comfortable Focused Breast exam: Bilateral breasts appear normal to inspection. No palpable cervical or pre auricular  LN. The very small lymph node  that she feels right below the mandible is physiologic. Bilateral breasts appear normal to inspection, right intramammary lymph node felt on palpation which was thought to be benign.  No palpable regional pathologic lymphadenopathy or other breast changes concerning for malignancy.  LABORATORY DATA:  I have reviewed the data as listed Lab Results  Component Value Date   WBC 7.8 10/13/2020   HGB 13.6 10/13/2020   HCT 40.5 10/13/2020   MCV 87.4 10/13/2020   PLT 218.0 10/13/2020     Chemistry      Component Value Date/Time   NA 138 10/13/2020 1415   NA 139 10/11/2019 1013   K 3.7 10/13/2020 1415   CL 103 10/13/2020 1415   CO2 31 10/13/2020 1415   BUN 10 10/13/2020 1415   BUN 12 10/11/2019 1013   CREATININE 0.70 10/13/2020 1415   CREATININE 0.75 09/04/2020 1150      Component Value Date/Time   CALCIUM 9.8 10/13/2020 1415   ALKPHOS 47 10/13/2020 1415   AST 13 10/13/2020 1415   AST 11 (L) 09/04/2020 1150   ALT 12 10/13/2020 1415   ALT 8 09/04/2020 1150   BILITOT 0.4 10/13/2020 1415   BILITOT 0.3 09/04/2020 1150      RADIOGRAPHIC STUDIES: I have personally reviewed the radiological images as listed and agreed with the findings in the report. No results found.  All questions were answered. The patient knows to call the clinic with any problems, questions or concerns. She will follow-up with [place] in about 6 months    Benay Pike, MD 04/16/2021 9:21 AM

## 2021-06-29 ENCOUNTER — Ambulatory Visit: Payer: Self-pay | Admitting: Family Medicine

## 2021-07-06 ENCOUNTER — Other Ambulatory Visit: Payer: Self-pay

## 2021-07-06 ENCOUNTER — Ambulatory Visit: Payer: Commercial Managed Care - PPO | Admitting: Physician Assistant

## 2021-07-06 ENCOUNTER — Encounter: Payer: Self-pay | Admitting: Physician Assistant

## 2021-07-06 VITALS — BP 120/70 | HR 74 | Temp 98.4°F | Ht 67.5 in | Wt 136.0 lb

## 2021-07-06 DIAGNOSIS — R5383 Other fatigue: Secondary | ICD-10-CM | POA: Diagnosis not present

## 2021-07-06 DIAGNOSIS — R591 Generalized enlarged lymph nodes: Secondary | ICD-10-CM | POA: Diagnosis not present

## 2021-07-06 DIAGNOSIS — F411 Generalized anxiety disorder: Secondary | ICD-10-CM

## 2021-07-06 DIAGNOSIS — Z23 Encounter for immunization: Secondary | ICD-10-CM | POA: Diagnosis not present

## 2021-07-06 LAB — CBC WITH DIFFERENTIAL/PLATELET
Basophils Absolute: 0 10*3/uL (ref 0.0–0.1)
Basophils Relative: 0.8 % (ref 0.0–3.0)
Eosinophils Absolute: 0.2 10*3/uL (ref 0.0–0.7)
Eosinophils Relative: 4 % (ref 0.0–5.0)
HCT: 38.7 % (ref 36.0–46.0)
Hemoglobin: 12.9 g/dL (ref 12.0–15.0)
Lymphocytes Relative: 43.2 % (ref 12.0–46.0)
Lymphs Abs: 2.6 10*3/uL (ref 0.7–4.0)
MCHC: 33.4 g/dL (ref 30.0–36.0)
MCV: 87.9 fl (ref 78.0–100.0)
Monocytes Absolute: 0.5 10*3/uL (ref 0.1–1.0)
Monocytes Relative: 7.7 % (ref 3.0–12.0)
Neutro Abs: 2.7 10*3/uL (ref 1.4–7.7)
Neutrophils Relative %: 44.3 % (ref 43.0–77.0)
Platelets: 225 10*3/uL (ref 150.0–400.0)
RBC: 4.4 Mil/uL (ref 3.87–5.11)
RDW: 12.6 % (ref 11.5–15.5)
WBC: 6.1 10*3/uL (ref 4.0–10.5)

## 2021-07-06 LAB — COMPREHENSIVE METABOLIC PANEL
ALT: 11 U/L (ref 0–35)
AST: 13 U/L (ref 0–37)
Albumin: 4.3 g/dL (ref 3.5–5.2)
Alkaline Phosphatase: 48 U/L (ref 39–117)
BUN: 10 mg/dL (ref 6–23)
CO2: 27 mEq/L (ref 19–32)
Calcium: 9.3 mg/dL (ref 8.4–10.5)
Chloride: 105 mEq/L (ref 96–112)
Creatinine, Ser: 0.73 mg/dL (ref 0.40–1.20)
GFR: 106.2 mL/min (ref >60.00)
Glucose, Bld: 80 mg/dL (ref 70–99)
Potassium: 4 mEq/L (ref 3.5–5.1)
Sodium: 140 mEq/L (ref 135–145)
Total Bilirubin: 0.3 mg/dL (ref 0.2–1.2)
Total Protein: 6.9 g/dL (ref 6.0–8.3)

## 2021-07-06 LAB — VITAMIN B12: Vitamin B-12: 334 pg/mL (ref 211–911)

## 2021-07-06 LAB — TSH: TSH: 3.8 u[IU]/mL (ref 0.35–5.50)

## 2021-07-06 NOTE — Progress Notes (Signed)
Kathryn Park is a 35 y.o. female here for a follow up hormones discussion.   History of Present Illness:   Chief Complaint  Patient presents with   Discuss hormones   Adenopathy    Pt would like you to check lymph node on left side x several months.    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 She reports that she has been working part time in order to pay for her COBRA. She is currently prescribed Buspar 5 mg. Currently compliant with buspar 5 mg in the morning daily with no adverse effects.   Kathryn Park says that she has been sleeping well with just a couple of nights where she won't sleep well. Besides this she is benefiting well from the medication and doesn't feel as worried or "jumpy". Prior to summer of this year, she was participating in over the phone therapy sessions, but since she hasn't attended therapy. No SI/HI ideations.    Adenopathy/Fatigue Kathryn Park says that recently she has been experiencing break outs on her neck, lack of a sex drive, and extreme fatigue that presents 1-2 days prior to her menstrual cycle. Her left lymph node feels a bit tender and as if it's slowly growing in size. Denies drainage form the site. She feels that the levels of fatigue she experiences are at the same level. At this moment she isn't sure of confirming anything with blood work since she following up with Dr. Chryl Heck within the next couple of months.  Kathryn Park has noticed since her oopherectomy, her periods have been very heavy for two days. Denies consistent night sweats or hemoptysis. Has had weight stabilization.  Wt Readings from Last 4 Encounters:  07/06/21 136 lb (61.7 kg)  04/16/21 131 lb 2 oz (59.5 kg)  02/13/21 127 lb 8 oz (57.8 kg)  02/12/21 130 lb 6.4 oz (59.1 kg)     Past Medical History:  Diagnosis Date   Abnormal Pap smear of cervix    Allergy    Anemia    suspected poor diet; vegetarian  during teens   Anxiety    Asthma    childhood   Cancer ([place])    Depression    Dysmenorrhea    Family history of breast cancer    Family history of kidney cancer    History of iron deficiency anemia    age 73   Joint pain    Leukocytosis    Low vitamin D level    Migraine with aura    never on rx or saw neurology   Ovarian cyst    Left   Pneumonia 01/2012    left lower lobe   Pre-diabetes 2015   had a "borderline" A1c   STD (sexually transmitted disease)    HSV1   Wears contact lenses    Wears glasses      Social History   Tobacco Use   Smoking status: Former    Packs/day: 1.00    Types: Cigarettes   Smokeless tobacco: Never  Vaping Use   Vaping Use: Never used  Substance Use Topics   Alcohol use: Not Currently   Drug use: No    Past Surgical History:  Procedure Laterality Date   BREAST BIOPSY Right 04/2020   benign   COLPOSCOPY     LAPAROSCOPIC OVARIAN CYSTECTOMY Left 11/13/2019   Procedure: LAPAROSCOPIC OVARIAN CYSTECTOMY/POSSIBLE LEFT OOPHORECTOMY/ COLLECTION OF PELVIC WASHINGS/ks;  Surgeon: Nunzio Cobbs, MD;  Location: [facility];  Service: Gynecology;  Laterality: Left;  Possible left oophorectomy Colection of pelvic washings To follow first case of the day   LEFT OOPHORECTOMY Left 11/2019   TONSILLECTOMY     UPPER GI ENDOSCOPY      Family History  Problem Relation Age of Onset   Diabetes Mother    Hypertension Mother    Stroke Mother        TIA   Factor V Leiden deficiency Mother    Other Mother        pituitary tumor   Crohn's disease Father    Kidney cancer Father        cancerous tumor of kidney   Liver disease Father    Breast cancer Maternal Grandmother        dx. 34s   Stroke Maternal Grandfather    Other Brother        ear tumor, dx. age 60-6   Rheum arthritis Maternal Great-grandmother    Cancer Other    Ovarian cancer Neg Hx    Endometrial cancer Neg Hx    Colon cancer Neg Hx     Allergies   Allergen Reactions   Shellfish Allergy Anaphylaxis    Throat closes   Other Other (See Comments)    Watermelon, mold/mildew, pet dander, dust and dust mites  Swelling of eyes and congestion   Latex Rash   Sulfa Antibiotics Rash    Current Medications:   Current Outpatient Medications:    busPIRone (BUSPAR) 5 MG tablet, Take 1 tablet (5 mg total) by mouth 3 (three) times daily as needed (anxiety)., Disp: 90 tablet, Rfl: 1   Fexofenadine-Pseudoephedrine (ALLEGRA-D 24 HOUR PO), Take 1 tablet by mouth as needed., Disp: , Rfl:    Multiple Vitamins-Minerals (MULTIPLE VITAMINS/WOMENS PO), Take 1 tablet by mouth daily in the afternoon., Disp: , Rfl:    valACYclovir (VALTREX) 1000 MG tablet, Take 2 tablets (2000 mg) by mouth twice a day for 24 hours. (Patient taking differently: as needed. Take 2 tablets (2000 mg) by mouth twice a day for 24 hours as needed for cold sore), Disp: 30 tablet, Rfl: 1   Review of Systems:   ROS Negative unless otherwise specified per HPI.  Vitals:   Vitals:   07/06/21 0834  BP: 120/70  Pulse: 74  Temp: 98.4 F (36.9 C)  TempSrc: Temporal  SpO2: 97%  Weight: 136 lb (61.7 kg)  Height: 5' 7.5" (1.715 m)     Body mass index is 20.99 kg/m.  Physical Exam:   Physical Exam Vitals and nursing note reviewed.  Constitutional:      General: She is not in acute distress.    Appearance: She is well-developed. She is not ill-appearing or toxic-appearing.  Cardiovascular:     Rate and Rhythm: Normal rate and regular rhythm.     Pulses: Normal pulses.     Heart sounds: Normal heart sounds, S1 normal and S2 normal.  Pulmonary:     Effort: Pulmonary effort is normal.     Breath sounds: Normal breath sounds.  Lymphadenopathy:     Cervical: Cervical adenopathy present.     Left cervical: Deep cervical adenopathy present. No superficial cervical adenopathy.  Skin:    General: Skin is warm and dry.  Neurological:     Mental Status: She is alert.     GCS:  GCS eye subscore is 4. GCS verbal subscore is 5. GCS motor subscore is 6.  Psychiatric:  Speech: Speech normal.        Behavior: Behavior normal. Behavior is cooperative.    Assessment and Plan:   Lymphadenopathy Given duration >3 months, will obtain u/s Ordered today and provided phone number to schedule  Follow-up based on results  Fatigue, unspecified type Suspect multifactorial Will update blood work to r/o organic cause, including iron panel given recent increase in heavy periods Follow-up based on results and clinical symptoms  GAD (generalized anxiety disorder) Overall controlled Continue buspar 5 mg I think she would benefit greatly from counseling -- recommendation given on AVS   I,Havlyn C Ratchford,acting as a scribe for [employer], PA.,have documented all relevant documentation on the behalf of [name], PA,as directed by  [name], PA while in the presence of [name], [place].  I, [name], [place], have reviewed all documentation for this visit. The documentation on 07/06/21 for the exam, diagnosis, procedures, and orders are all accurate and complete.  [name], PA-C

## 2021-07-06 NOTE — Patient Instructions (Signed)
It was great to see you!  [email or web address]  Derrek Monaco look into this site and consider reaching out for new patient appointment  Update blood work today  I'll be in touch with results  I am putting in referral for you to have imaging of your neck -- after today, call [phone] to schedule at [facility],  [name] PA-C

## 2021-07-07 ENCOUNTER — Ambulatory Visit: Payer: Self-pay

## 2021-07-07 ENCOUNTER — Encounter: Payer: Self-pay | Admitting: Physician Assistant

## 2021-07-07 ENCOUNTER — Encounter: Payer: Self-pay | Admitting: Family Medicine

## 2021-07-07 ENCOUNTER — Other Ambulatory Visit: Payer: Self-pay

## 2021-07-07 ENCOUNTER — Ambulatory Visit: Payer: Commercial Managed Care - PPO | Admitting: Family Medicine

## 2021-07-07 VITALS — BP 104/70 | HR 55 | Ht 67.5 in | Wt 136.0 lb

## 2021-07-07 DIAGNOSIS — M25552 Pain in left hip: Secondary | ICD-10-CM | POA: Diagnosis not present

## 2021-07-07 DIAGNOSIS — G8929 Other chronic pain: Secondary | ICD-10-CM

## 2021-07-07 DIAGNOSIS — M25551 Pain in right hip: Secondary | ICD-10-CM

## 2021-07-07 LAB — IRON,TIBC AND FERRITIN PANEL
%SAT: 16 % (calc) (ref 16–45)
Ferritin: 24 ng/mL (ref 16–154)
Iron: 56 ug/dL (ref 40–190)
TIBC: 343 mcg/dL (calc) (ref 250–450)

## 2021-07-07 NOTE — Progress Notes (Signed)
I, Kathryn Park, LAT, ATC, am serving as scribe for Dr. Lynne Park.  Kathryn Park is a 35 y.o. female who presents to [facility] today cont B hip pain and mechanical symptoms, R>L, thought to be due to hip abductor weakness and tendinopathy. Of note, pt has a hx of ovarian GCT. She was last seen by Dr. [name] on 01/29/21 and noted 50-75% improvement in her symptoms w/ PT.  She was advised to cont PT, but she did not complete any additional visits. Today, pt reports that her hip pain remains unchanged.  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  She con't to have pain and mechanical symptoms.  Prolonged positioning con't to be an aggravating factor.    Pain is located at the anterior to lateral hip right worse than left.  Dx imaging: 12/11/20 B hip XR  Pertinent review of systems: No fevers or chills  Relevant historical information: History of stage I ovarian cancer status post removal about 18 months ago   Exam:  BP 104/70 (BP Location: Left Arm, Patient Position: Sitting, Cuff Size: Normal)   Pulse (!) 55   Ht 5' 7.5" (1.715 m)   Wt 136 lb (61.7 kg)   LMP 06/28/2021   SpO2 99%   BMI 20.99 kg/m  General: Well Developed, well nourished, and in no acute distress.   MSK: Right hip normal-appearing Normal motion pain with flexion and internal rotation. Tender palpation mildly greater trochanter.  Hip abduction strength diminished producing anterior hip pain.  External rotation strength diminished without significant anterior hip pain.  Left hip normal-appearing Normal motion pain with flexion and internal rotation. Mildly tender palpation at greater trochanter.  Hip abduction strength is diminished also producing anterior hip pain.  External rotation strength is mildly diminished without significant anterior hip pain.    Lab and Radiology Results  EXAM: DG HIP  (WITH OR WITHOUT PELVIS) 5+V BILAT   COMPARISON:  None.   FINDINGS: There is no evidence of hip fracture or dislocation. There is no evidence of arthropathy or other focal bone abnormality. There are calcifications projecting over the patient's pelvis that are favored to represent phleboliths.   IMPRESSION: Negative.     Electronically Signed   By: Kathryn Park M.D.   On: 12/13/2020 15:12   I, Kathryn Park, personally (independently) visualized and performed the interpretation of the images attached in this note.      Assessment and Plan: 35 y.o. female with chronic hip pain right worse than left.  This is an ongoing problem now for greater than 6 months.  Patient has completed a trial of physical therapy ending in April of this year with ongoing physician directed home exercise program from the end of physical therapy in April until now with continued pain.  At this point the majority of her pain is anterior which is more consistent with a labrum tear than hip abductor tendinopathy.  Plan for MRI arthrogram of the right hip to further evaluate cause of pain and for potential surgical planning.  Recheck after MRI. Home exercise program includes 20 minutes of exercise at least 3 times a week.  PDMP not reviewed this encounter. Orders Placed This Encounter  Procedures   [place] LIMITED JOINT SPACE STRUCTURES LOW BILAT(NO LINKED CHARGES)    Order Specific Question:   Reason for Exam (SYMPTOM  OR DIAGNOSIS REQUIRED)  Answer:   B hip pain    Order Specific Question:   Preferred imaging location?    Answer:   [place]   MR HIP RIGHT W CONTRAST    No IV contrast.  Arthrogram only. Schedule w/ Dr. Darene Lamer 1 hour before MRI for injection.    Standing Status:   Future    Standing Expiration Date:   07/07/2022    Scheduling Instructions:     No IV contrast.  Arthrogram only. Schedule w/ Dr. Darene Lamer 1 hour before MRI for injection.    Order Specific Question:   If  indicated for the ordered procedure, I authorize the administration of contrast media per Radiology protocol    Answer:   Yes    Order Specific Question:   What is the patient's sedation requirement?    Answer:   No Sedation    Order Specific Question:   Does the patient have a pacemaker or implanted devices?    Answer:   No    Order Specific Question:   Preferred imaging location?    Answer:   [facility] (table limit-350lbs)   No orders of the defined types were placed in this encounter.    Discussed warning signs or symptoms. Please see discharge instructions. Patient expresses understanding.   The above documentation has been reviewed and is accurate and complete Kathryn Park, M.D.

## 2021-07-07 NOTE — Patient Instructions (Signed)
Good to see you today.  I've ordered an MRI arthrogram to [facility].  They will call you to schedule but let us know if you haven't heard from them by the end of the week.  Follow-up after MRI.

## 2021-07-08 ENCOUNTER — Ambulatory Visit
Admission: RE | Admit: 2021-07-08 | Discharge: 2021-07-08 | Disposition: A | Discharge status: Home / Self Care | Payer: Commercial Managed Care - PPO | Source: Ambulatory Visit | Attending: Physician Assistant | Admitting: Physician Assistant

## 2021-07-08 DIAGNOSIS — R591 Generalized enlarged lymph nodes: Secondary | ICD-10-CM

## 2021-07-20 ENCOUNTER — Other Ambulatory Visit: Payer: Self-pay

## 2021-07-20 ENCOUNTER — Ambulatory Visit (INDEPENDENT_AMBULATORY_CARE_PROVIDER_SITE_OTHER): Payer: Commercial Managed Care - PPO

## 2021-07-20 ENCOUNTER — Ambulatory Visit: Payer: Commercial Managed Care - PPO | Admitting: Sports Medicine

## 2021-07-20 DIAGNOSIS — M25552 Pain in left hip: Secondary | ICD-10-CM | POA: Diagnosis not present

## 2021-07-20 DIAGNOSIS — M25551 Pain in right hip: Secondary | ICD-10-CM

## 2021-07-20 DIAGNOSIS — G8929 Other chronic pain: Secondary | ICD-10-CM

## 2021-07-20 MED ORDER — GADOBUTROL 1 MMOL/ML IV SOLN
1.0000 mL | Freq: Once | INTRAVENOUS | Status: AC | PRN
  Administered 2021-07-20: 1 mL via INTRAVENOUS

## 2021-07-20 NOTE — Progress Notes (Signed)
    Procedures performed today:    Procedure: Real-time Ultrasound Guided gadolinium contrast injection of right hip joint Device: Samsung HS60  Verbal informed consent obtained.  Time-out conducted.  Noted no overlying erythema, induration, or other signs of local infection.  Skin prepped in a sterile fashion.  Local anesthesia: Topical Ethyl chloride.  With sterile technique and under real time ultrasound guidance: Noted normal-appearing hip joint, 22-gauge spinal needle advanced to the femoral head/neck junction, contacted bone, I then injected 1 cc kenalog 40, 2 cc lidocaine, 2 cc bupivacaine, syringe switched and 0.1 cc gadolinium injected, syringe switched again and 10 cc sterile saline used to fully distend the joint. Joint visualized and capsule seen distending confirming intra-articular placement of contrast material and medication. Completed without difficulty  Advised to call if fevers/chills, erythema, induration, drainage, or persistent bleeding.  Images permanently stored in PACS Impression: Technically successful ultrasound guided gadolinium contrast injection for MR arthrography.  Please see separate MR arthrogram report.  Independent interpretation of notes and tests performed by another provider:   None.  Brief History, Exam, Impression, and Recommendations:    Bilateral hip pain Injection for hip MR arthrography on the right today. Further management per primary treating provider.    ___________________________________________ Gwen Her. Dianah Field, M.D., ABFM., CAQSM. Primary Care and [place] [employer] [facility]

## 2021-07-20 NOTE — Assessment & Plan Note (Signed)
Injection for hip MR arthrography on the right today. Further management per primary treating provider.

## 2021-07-22 ENCOUNTER — Ambulatory Visit: Payer: Commercial Managed Care - PPO | Admitting: Family Medicine

## 2021-07-22 ENCOUNTER — Other Ambulatory Visit: Payer: Self-pay

## 2021-07-22 ENCOUNTER — Encounter: Payer: Self-pay | Admitting: Family Medicine

## 2021-07-22 VITALS — BP 128/80 | HR 95 | Ht 67.5 in | Wt 139.2 lb

## 2021-07-22 DIAGNOSIS — M24151 Other articular cartilage disorders, right hip: Secondary | ICD-10-CM

## 2021-07-22 DIAGNOSIS — S73191A Other sprain of right hip, initial encounter: Secondary | ICD-10-CM | POA: Insufficient documentation

## 2021-07-22 NOTE — Progress Notes (Signed)
I, Peterson Lombard, LAT, ATC acting as a scribe for [name], MD.  Kathryn Park is a 35 y.o. female who presents to [place] [facility] today for f/u bilat hip pain and MRI review. Pt was last seen by Dr. [name] on 07/07/21 and was advised to proceed to MRI arthrogram. Today, pt reports R hip is pretty painful that past couple days.  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   MSK: Right hip normal-appearing normal motion.    Lab and Radiology Results No results found for this or any previous visit (from the past 72 hour(s)). MR HIP RIGHT W CONTRAST  Result Date: 07/22/2021 CLINICAL DATA:  Right hip pain and popping for 2 years. EXAM: MRI OF THE RIGHT HIP WITH CONTRAST (MR Arthrogram) TECHNIQUE: Multiplanar, multisequence MR imaging of the hip was performed immediately following contrast injection into the hip joint under fluoroscopic guidance. No intravenous contrast was administered. COMPARISON:  Radiographs 12/11/2020 FINDINGS: Bones: No significant abnormal osseous edema. No substantial degree of acetabular or femoral head spurring. 0.9 cm focus of accentuated T2 and low T1 signal in the upper sacrum about the S2 or S3 level on image 9 series 3, not included on axial or sagittal sequences, potentially a small atypical hemangioma similar benign structure but technically nonspecific. Articular cartilage and labrum Articular cartilage:  No focal chondral defect is identified. Labrum: Linear accentuated signal undercutting the anterior labrum on images 8 through 10 of series 7 compatible with an  anterior labral tear, also shown on image 19 series 9. Mild degeneration of the adjacent labrum. Bursae: No regional bursitis Muscles and tendons Muscles and tendons:  Unremarkable Other findings Miscellaneous: Trace nonspecific free pelvic fluid, possibly physiologic. 1.2 cm lesion with homogeneous high T1 and T2 signal in the right ovary, probably a corpus luteum cyst or small hemorrhagic cyst. This is not felt to require further workup. IMPRESSION: 1. Anterior labral tear.  No significant paralabral cyst. 2. Small focus of accentuated T2 signal in the S2 or S3 vertebral level near the midline, nonspecific. Given the lack of surrounding edema and the patient's age group this is statistically most likely to be benign/incidental. 3. Small complex/hemorrhagic cystic lesion in the right ovary, 1.2 cm diameter, considered benign and incidental. 4. Trace amount of free pelvic fluid, probably physiologic. Electronically Signed   By: Van Clines M.D.   On: 07/22/2021 09:14   [place] LIMITED JOINT SPACE STRUCTURES LOW RIGHT  Result Date: 07/20/2021 Procedure: Real-time Ultrasound Guided gadolinium contrast injection of right hip joint Device: Samsung HS60 Verbal informed consent obtained. Time-out conducted. Noted no overlying erythema, induration, or other signs of local infection. Skin prepped in a sterile fashion. Local anesthesia: Topical Ethyl chloride. With sterile technique and under real time ultrasound guidance: Noted normal-appearing hip joint, 22-gauge spinal needle advanced to the femoral head/neck junction, contacted bone, I then injected 1 cc kenalog 40, 2 cc lidocaine, 2 cc bupivacaine, syringe switched and 0.1 cc gadolinium injected, syringe switched again and 10 cc sterile saline used  to fully distend the joint. Joint visualized and capsule seen distending confirming intra-articular placement of contrast material and medication. Completed without difficulty Advised to call if fevers/chills,  erythema, induration, drainage, or persistent bleeding. Images permanently stored in PACS Impression: Technically successful ultrasound guided gadolinium contrast injection for MR arthrography.  Please see separate MR arthrogram report.      Assessment and Plan: 35 y.o. female with right hip pain due to labrum tear.  After discussion patient has failed conservative management and is potentially a good surgical candidate for labrum surgery.  Plan to refer to Dr. Aretha Parrot at [facility] to discuss potential surgical options for her labrum tear.  Of note she does have an incidental finding in her sacrum on the hip MRI.   "Small focus of accentuated T2 signal in the S2 or S3 vertebral level near the midline, nonspecific." This is almost certainly benign.  I spoke with the radiologist Dr. Janeece Fitting and her gynecology oncologist (Dr Berline Lopes) about this and both think metastatic involvement to the bone is extremely unlikely and do not recommend further imaging at this time.  If needed next imaging step could be bone scan or MRI of the sacrum with and without contrast.   PDMP not reviewed this encounter. Orders Placed This Encounter  Procedures   Ambulatory referral to Orthopedic Surgery    Referral Priority:   Routine    Referral Type:   Surgical    Referral Reason:   Specialty Services Required    Referred to Provider:   Grace Blight, MD    Requested Specialty:   Orthopedic Surgery    Number of Visits Requested:   1   No orders of the defined types were placed in this encounter.   Total encounter time 30 minutes including face-to-face time with the patient and, reviewing past medical record, and charting on the date of service.   Discussed MRI findings treatment plan and option and discussion with her other medical providers as above  Discussed warning signs or symptoms. Please see discharge instructions. Patient expresses understanding.   The above documentation has been  reviewed and is accurate and complete [name], M.D.

## 2021-07-22 NOTE — Patient Instructions (Signed)
Thank you for coming in today.   You should hear from Dr Erling Cruz office.   Call [facility] at [phone] and have them make you a CD of the images and bring it with you to the appointment.   I will let you know what Dr Berline Lopes and Dr Karlene Einstein say about that weird spot on the MRI.

## 2021-07-24 ENCOUNTER — Telehealth: Payer: Self-pay | Admitting: *Deleted

## 2021-07-24 NOTE — Telephone Encounter (Signed)
Moved patient's appt from 11/15 to 11/7 per Dr Berline Lopes

## 2021-08-07 ENCOUNTER — Encounter: Payer: Self-pay | Admitting: Gynecologic Oncology

## 2021-08-10 ENCOUNTER — Ambulatory Visit (HOSPITAL_COMMUNITY)
Admission: RE | Admit: 2021-08-10 | Discharge: 2021-08-10 | Disposition: A | Discharge status: Home / Self Care | Payer: Commercial Managed Care - PPO | Source: Ambulatory Visit | Attending: Gynecologic Oncology | Admitting: Gynecologic Oncology

## 2021-08-10 ENCOUNTER — Inpatient Hospital Stay: Payer: Commercial Managed Care - PPO | Admitting: Gynecologic Oncology

## 2021-08-10 ENCOUNTER — Other Ambulatory Visit: Payer: Self-pay

## 2021-08-10 ENCOUNTER — Inpatient Hospital Stay: Payer: Commercial Managed Care - PPO | Attending: Hematology and Oncology

## 2021-08-10 VITALS — BP 117/80 | HR 86 | Temp 99.4°F | Resp 16 | Ht 67.0 in | Wt 130.2 lb

## 2021-08-10 DIAGNOSIS — Z90721 Acquired absence of ovaries, unilateral: Secondary | ICD-10-CM | POA: Insufficient documentation

## 2021-08-10 DIAGNOSIS — Z79899 Other long term (current) drug therapy: Secondary | ICD-10-CM | POA: Diagnosis not present

## 2021-08-10 DIAGNOSIS — L709 Acne, unspecified: Secondary | ICD-10-CM | POA: Insufficient documentation

## 2021-08-10 DIAGNOSIS — D3912 Neoplasm of uncertain behavior of left ovary: Secondary | ICD-10-CM

## 2021-08-10 DIAGNOSIS — Z87891 Personal history of nicotine dependence: Secondary | ICD-10-CM | POA: Insufficient documentation

## 2021-08-10 DIAGNOSIS — F419 Anxiety disorder, unspecified: Secondary | ICD-10-CM | POA: Insufficient documentation

## 2021-08-10 DIAGNOSIS — F32A Depression, unspecified: Secondary | ICD-10-CM | POA: Diagnosis not present

## 2021-08-10 DIAGNOSIS — N946 Dysmenorrhea, unspecified: Secondary | ICD-10-CM | POA: Insufficient documentation

## 2021-08-10 NOTE — Patient Instructions (Addendum)
It was great to see you today! I don't feel anything concerning for recurrent of your tumor on exam. I will release labs from today when they are back.   Depending on lab work from today, we will discuss utility of getting some hormone levels.  I will see you in 6 months. If you develop new symptoms before your next scheduled visit, please call to see me sooner.

## 2021-08-10 NOTE — Progress Notes (Signed)
Gynecologic Oncology Return Clinic Visit  08/10/21  Reason for Visit: surveillance in the setting of  granulosa cell tumor  Treatment History: Oncology History  Granulosa cell tumor of left ovary  10/18/2019 Imaging   Pelvic ultrasound: 11 x 7 x 9 cm left ovarian cyst, avascular with thin septation.  No free fluid.  Right ovary normal in appearance.   11/13/2019 Surgery   Laparoscopic left oophorectomy with collection of pelvic washings, lysis of adhesion.  Findings at the time of surgery were a 12 cm left ovarian cyst.  No ascites or intra-abdominal/pelvic disease.  Ovary was removed in a contained fashion and in an Endo Catch bag.   11/13/2019 Pathology Results   Left ovary showing granulosa cell tumor, 2.1 cm.  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 Pelvic washings negative for malignant cells.   11/13/2019 Initial Diagnosis   Granulosa cell tumor of left ovary   11/29/2019 Imaging   CT A/P: No acute findings in the abdomen or pelvis.  Specifically, no evidence for metastatic disease in the abdomen or pelvis. Trace free fluid in the cul-de-sac.  This can be physiologic in a premenopausal female.   12/27/2019 Tumor Marker   Patient's blood was tested for the following markers: Inh B, AMH Results of the tumor marker test revealed: 83, 1.07.     Interval History: Patient presents today for follow-up.  She notes overall doing well.  She has continued to have heavy painful periods since winter/late spring.  She will have 2 or 3 days of heavy flow and then very light bleeding for several additional days.  She has 1 day with significant pain as well as passage of blood clots.  Her menses are regular, denies any intermenstrual bleeding.  With regard to her appetite, she has continued to be more mindful about when and what she is eating.  She still feels like she has to force herself to eat some.  She has gained some weight but overall down from recent PCP visit.   She has occasional abdominal pain, nothing that she describes as significant.  She denies any nausea or vomiting.  She reports normal bowel function.  She has had some increased urinary frequency for a month or so.  She has had a rash that in some areas looks like acne over her neck and upper shoulders.  Patient has a labrum tear, plan is to have her see Dr. Aretha Parrot at [facility] to discuss potential surgical options.  This visit is scheduled for early December.  Past Medical/Surgical History: Past Medical History:  Diagnosis Date   Abnormal Pap smear of cervix    Allergy    Anemia    suspected poor diet; vegetarian during teens   Anxiety    Asthma    childhood   Cancer ([place])    Depression    Dysmenorrhea    Family history of breast cancer    Family history of kidney cancer    History of iron deficiency anemia    age 35   Joint pain    Leukocytosis    Low vitamin D level    Migraine with aura    never on rx or saw neurology   Ovarian cyst    Left   Pneumonia 01/2012    left lower lobe   Pre-diabetes 2015   had a "borderline" A1c   STD (sexually transmitted disease)    HSV1   Wears contact lenses    Wears glasses  Past Surgical History:  Procedure Laterality Date   BREAST BIOPSY Right 04/2020   benign   COLPOSCOPY     LAPAROSCOPIC OVARIAN CYSTECTOMY Left 11/13/2019   Procedure: LAPAROSCOPIC OVARIAN CYSTECTOMY/POSSIBLE LEFT OOPHORECTOMY/ COLLECTION OF PELVIC WASHINGS/ks;  Surgeon: Nunzio Cobbs, MD;  Location: [facility];  Service: Gynecology;  Laterality: Left;  Possible left oophorectomy Colection of pelvic washings To follow first case of the day   LEFT OOPHORECTOMY Left 11/2019   TONSILLECTOMY     UPPER GI ENDOSCOPY      Family History  Problem Relation Age of Onset   Diabetes Mother    Hypertension Mother    Stroke Mother        TIA   Factor V Leiden deficiency Mother    Other Mother        pituitary tumor   Crohn's  disease Father    Kidney cancer Father        cancerous tumor of kidney   Liver disease Father    Breast cancer Maternal Grandmother        dx. 17s   Stroke Maternal Grandfather    Other Brother        ear tumor, dx. age 62-6   Rheum arthritis Maternal Great-grandmother    Cancer Other    Ovarian cancer Neg Hx    Endometrial cancer Neg Hx    Colon cancer Neg Hx     Social History   Socioeconomic History   Marital status: Married    Spouse name: Not on file   Number of children: Not on file   Years of education: Not on file   Highest education level: Not on file  Occupational History   Not on file  Tobacco Use   Smoking status: Former    Packs/day: 1.00    Types: Cigarettes   Smokeless tobacco: Never  Vaping Use   Vaping Use: Never used  Substance and Sexual Activity   Alcohol use: Not Currently   Drug use: No   Sexual activity: Yes    Partners: Male    Birth control/protection: Condom    Comment: everytime  Other Topics Concern   Not on file  Social History Narrative   Moved from [place]   Married   Social Determinants of Health   Financial Resource Strain: Not on file  Food Insecurity: Not on file  Transportation Needs: Not on file  Physical Activity: Not on file  Stress: Not on file  Social Connections: Not on file    Current Medications:  Current Outpatient Medications:    busPIRone (BUSPAR) 5 MG tablet, Take 1 tablet (5 mg total) by mouth 3 (three) times daily as needed (anxiety)., Disp: 90 tablet, Rfl: 1   cyanocobalamin 1000 MCG tablet, Take 1,000 mcg by mouth daily., Disp: , Rfl:    Multiple Vitamins-Minerals (MULTIPLE VITAMINS/WOMENS PO), Take 1 tablet by mouth daily in the afternoon., Disp: , Rfl:    valACYclovir (VALTREX) 1000 MG tablet, Take 2 tablets (2000 mg) by mouth twice a day for 24 hours. (Patient taking differently: as needed. Take 2 tablets (2000 mg) by mouth twice a day for 24 hours as needed for cold  sore), Disp: 30 tablet, Rfl: 1   Fexofenadine-Pseudoephedrine (ALLEGRA-D 24 HOUR PO), Take 1 tablet by mouth as needed. (Patient not taking: Reported on 08/07/2021), Disp: , Rfl:   Review of Systems: Pertinent positives include urinary frequency, menstrual problems, joint pain, swollen glands/lymph  nodes. Denies appetite changes, fevers, chills, fatigue, unexplained weight changes. Denies hearing loss, neck lumps or masses, mouth sores, ringing in ears or voice changes. Denies cough or wheezing.  Denies shortness of breath. Denies chest pain or palpitations. Denies leg swelling. Denies abdominal distention, pain, blood in stools, constipation, diarrhea, nausea, vomiting, or early satiety. Denies pain with intercourse, dysuria, hematuria or incontinence. Denies hot flashes, pelvic pain or vaginal discharge.   Denies back pain or muscle pain/cramps. Denies itching, rash, or wounds. Denies dizziness, headaches, numbness or seizures. Denies denies easy bruising or bleeding. Denies anxiety, depression, confusion, or decreased concentration.  Physical Exam: BP 117/80 (BP Location: Left Arm, Patient Position: Sitting)   Pulse 86   Temp 99.4 F (37.4 C) (Oral)   Resp 16   Ht 5\' 7"  (1.702 m)   Wt 130 lb 3.2 oz (59.1 kg)   SpO2 100%   BMI 20.39 kg/m  General: Alert, oriented, no acute distress. HEENT: Normocephalic, atraumatic, sclera anicteric. Chest: Clear to auscultation bilaterally.  Unlabored breathing on room air. Cardiovascular: Regular rate and rhythm, no murmurs. Abdomen: soft, nontender.  Normoactive bowel sounds.  No masses or hepatosplenomegaly appreciated.  Well-healed incisions. Extremities: Grossly normal range of motion.  Warm, well perfused.  No edema bilaterally. Skin: Some mild erythematous rash and acneform lesions on the patient's neck, upper back, and shoulders. Lymphatics: No cervical, supraclavicular, or inguinal adenopathy. GU: Normal appearing external genitalia  without erythema, excoriation, or lesions.  Speculum exam reveals well rugated vaginal mucosa, no lesions or masses.  Normal-appearing cervix.  Bimanual exam reveals small mobile uterus, no adnexal masses.    Laboratory & Radiologic Studies: Pelvic ultrasound from today is still pending review  Assessment & Plan: Kathryn Park is a 35 y.o. 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   Patient is doing well from a cancer standpoint. NED on exam. Labs drawn today. On my review of the ultrasound, remaining ovary is normal in appearance.  We discussed premenstrual symptoms and acne/rash. This may be related to hormonal changes. My recommendation is that we wait for inhibin B and AMH. If both normal, we could get estrogen and progesterone level (will be difficult to interpret unless very abnormal). It may be worth consideration of birth control to see if we can improve her symptoms.   We reviewed signs and symptoms again that would be concerning for disease recurrence.  I will release her inhibin B and anti-mllerian hormone levels when they result.  Per [facility] surveillance recommendations, we will continue on visits every 6 months.  She knows to call if she develops any symptoms before her next scheduled visit.    38 minutes of total time was spent for this patient encounter, including preparation, face-to-face counseling with the patient and coordination of care, and documentation of the encounter.  Jeral Pinch, MD  Division of Gynecologic Oncology  Department of Obstetrics and Gynecology  [facility]

## 2021-08-12 LAB — INHIBIN B: Inhibin B: 32.7 pg/mL

## 2021-08-13 LAB — ANTI MULLERIAN HORMONE: ANTI-MULLERIAN HORMONE (AMH): 0.471 ng/mL

## 2021-08-18 ENCOUNTER — Encounter: Payer: Self-pay | Admitting: Gynecologic Oncology

## 2021-08-18 ENCOUNTER — Ambulatory Visit: Payer: Commercial Managed Care - PPO | Admitting: Gynecologic Oncology

## 2021-08-18 ENCOUNTER — Other Ambulatory Visit: Payer: Commercial Managed Care - PPO

## 2021-08-18 ENCOUNTER — Ambulatory Visit (HOSPITAL_COMMUNITY): Payer: Commercial Managed Care - PPO

## 2021-08-20 ENCOUNTER — Other Ambulatory Visit: Payer: Self-pay | Admitting: Gynecologic Oncology

## 2021-08-20 ENCOUNTER — Telehealth: Payer: Self-pay

## 2021-08-20 ENCOUNTER — Other Ambulatory Visit: Payer: Self-pay

## 2021-08-20 DIAGNOSIS — L709 Acne, unspecified: Secondary | ICD-10-CM

## 2021-08-20 DIAGNOSIS — N926 Irregular menstruation, unspecified: Secondary | ICD-10-CM

## 2021-08-20 NOTE — Telephone Encounter (Signed)
Left message requesting return call to schedule lab appointment for Monday 08/24/21.

## 2021-08-21 NOTE — Telephone Encounter (Signed)
Spoke with patient this morning. Lab appointment schdeuled for 08/24/21 at 08:15. Patient is in agreement of date and time of appointment.

## 2021-08-24 ENCOUNTER — Inpatient Hospital Stay: Payer: Commercial Managed Care - PPO

## 2021-08-24 ENCOUNTER — Other Ambulatory Visit: Payer: Self-pay

## 2021-08-24 DIAGNOSIS — D3912 Neoplasm of uncertain behavior of left ovary: Secondary | ICD-10-CM | POA: Diagnosis not present

## 2021-08-24 DIAGNOSIS — N926 Irregular menstruation, unspecified: Secondary | ICD-10-CM

## 2021-08-24 DIAGNOSIS — L709 Acne, unspecified: Secondary | ICD-10-CM

## 2021-08-25 ENCOUNTER — Telehealth: Payer: Self-pay

## 2021-08-25 LAB — LUTEINIZING HORMONE: LH: 6.2 m[IU]/mL

## 2021-08-25 LAB — DHEA-SULFATE: DHEA-SO4: 82 ug/dL (ref 57.3–279.2)

## 2021-08-25 LAB — ESTRADIOL: Estradiol: 84.7 pg/mL

## 2021-08-25 LAB — FOLLICLE STIMULATING HORMONE: FSH: 5.9 m[IU]/mL

## 2021-08-25 NOTE — Telephone Encounter (Signed)
Returning call to patient re: CD copy of CT scan. Patient states she emailed medical records about this last week and wanted to follow up on this. Provided patient with number to radiology to check on the status. Phone number [phone]. Patient verbalized understanding.

## 2021-08-28 LAB — TESTOSTERONE, FREE, TOTAL, SHBG
Sex Hormone Binding: 89.5 nmol/L (ref 24.6–122.0)
Testosterone, Free: 0.5 pg/mL (ref 0.0–4.2)
Testosterone: 9 ng/dL (ref 8–60)

## 2021-09-04 ENCOUNTER — Ambulatory Visit (HOSPITAL_COMMUNITY)
Admission: RE | Admit: 2021-09-04 | Discharge: 2021-09-04 | Disposition: A | Discharge status: Home / Self Care | Payer: Commercial Managed Care - PPO | Source: Ambulatory Visit | Attending: Hematology and Oncology | Admitting: Hematology and Oncology

## 2021-09-04 DIAGNOSIS — Z9189 Other specified personal risk factors, not elsewhere classified: Secondary | ICD-10-CM | POA: Insufficient documentation

## 2021-09-04 MED ORDER — GADOBUTROL 1 MMOL/ML IV SOLN
6.0000 mL | Freq: Once | INTRAVENOUS | Status: AC | PRN
  Administered 2021-09-04: 6 mL via INTRAVENOUS

## 2021-10-14 ENCOUNTER — Inpatient Hospital Stay: Payer: Commercial Managed Care - PPO | Attending: Hematology and Oncology | Admitting: Hematology and Oncology

## 2021-10-14 ENCOUNTER — Inpatient Hospital Stay: Payer: Commercial Managed Care - PPO | Admitting: Hematology and Oncology

## 2021-10-14 ENCOUNTER — Encounter: Payer: Self-pay | Admitting: Hematology and Oncology

## 2021-10-14 ENCOUNTER — Other Ambulatory Visit: Payer: Self-pay

## 2021-10-14 VITALS — BP 114/63 | HR 80 | Temp 97.7°F | Resp 18 | Ht 67.0 in | Wt 137.2 lb

## 2021-10-14 DIAGNOSIS — Z803 Family history of malignant neoplasm of breast: Secondary | ICD-10-CM | POA: Diagnosis not present

## 2021-10-14 DIAGNOSIS — Z8543 Personal history of malignant neoplasm of ovary: Secondary | ICD-10-CM | POA: Diagnosis not present

## 2021-10-14 DIAGNOSIS — C562 Malignant neoplasm of left ovary: Secondary | ICD-10-CM | POA: Diagnosis not present

## 2021-10-14 DIAGNOSIS — Z08 Encounter for follow-up examination after completed treatment for malignant neoplasm: Secondary | ICD-10-CM

## 2021-10-14 DIAGNOSIS — Z9189 Other specified personal risk factors, not elsewhere classified: Secondary | ICD-10-CM

## 2021-10-14 DIAGNOSIS — Z87891 Personal history of nicotine dependence: Secondary | ICD-10-CM | POA: Insufficient documentation

## 2021-10-14 DIAGNOSIS — Z8051 Family history of malignant neoplasm of kidney: Secondary | ICD-10-CM | POA: Insufficient documentation

## 2021-10-14 DIAGNOSIS — D3912 Neoplasm of uncertain behavior of left ovary: Secondary | ICD-10-CM | POA: Diagnosis present

## 2021-10-14 NOTE — Progress Notes (Signed)
[place] FOLLOW UP NOTE  Patient Care Team: [name], [place] as PCP - General (Physician Assistant) Lafonda Mosses, MD as Consulting Physician (Gynecologic Oncology)  CHIEF COMPLAINTS/PURPOSE OF CONSULTATION:    ASSESSMENT & PLAN:  No problem-specific Assessment & Plan notes found for this encounter.   Orders Placed This Encounter  Procedures   MM DIGITAL SCREENING BILATERAL    Standing Status:   Future    Standing Expiration Date:   10/14/2022    Order Specific Question:   Reason for exam:    Answer:   screening    Order Specific Question:   Preferred imaging location?    Answer:   [facility]    36 year old pleasant female patient with a past medical history significant for granulosa cell ovarian carcinoma status post left oophorectomy referred to high risk breast clinic. Her 5-year risk of breast cancer per [employer] model is 0.6% and her lifetime risk of breast cancer per [employer] model is 23%.  MRI breast in December 2022 without evidence of breast malignancy.  She is due for her repeat mammogram in June 2023 . Physical examination today bilateral breasts inspected, no concern for palpable lumps or nipple discharge or regional lymphadenopathy.  Right breast upper outer quadrant with intramammary lymph node which has been examined a few times and was thought to be a benign intramammary lymph node. No change in this. Patient's area of concerns didn't show any palpable masses. These areas were bothersome to her before the MRI and MRI didn't show any area of concerns. She will return to clinic in about 6 months for follow-up, encouraged to contact us sooner with any new questions or concerns. We have discussed that since she does not have any family members who are younger with breast cancer, we could continue MRIs annually starting at the age of 72 but given her past history of ovarian cancer and her frequent concerns with pain in the breast, she would  like to continue annual MRIs at this time and not wait until she is 36 years old.  HISTORY OF PRESENTING ILLNESS:  Kathryn Park 36 y.o. female is here because of High risk for breast cancer  Oncology History  Granulosa cell tumor of left ovary  10/18/2019 Imaging   Pelvic ultrasound: 11 x 7 x 9 cm left ovarian cyst, avascular with thin septation.  No free fluid.  Right ovary normal in appearance.   11/13/2019 Surgery   Laparoscopic left oophorectomy with collection of pelvic washings, lysis of adhesion.  Findings at the time of surgery were a 12 cm left ovarian cyst.  No ascites or intra-abdominal/pelvic disease.  Ovary was removed in a contained fashion and in an Endo Catch bag.   11/13/2019 Pathology Results   Left ovary showing granulosa cell tumor, 2.1 cm.  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 Pelvic washings negative for malignant cells.   11/13/2019 Initial Diagnosis   Granulosa cell tumor of left ovary   11/29/2019 Imaging   CT A/P: No acute findings in the abdomen or pelvis.  Specifically, no evidence for metastatic disease in the abdomen or pelvis. Trace free fluid in the cul-de-sac.  This can be physiologic in a premenopausal female.   12/27/2019 Tumor Marker   Patient's blood was tested for the following markers: Inh B, AMH Results of the tumor marker test revealed: 83, 1.07.    She was referred to breast clinic given her increased life time risk of breast  cancer over 20 % based on TC model.  INTERIM HISTORY  Patient is here for a follow-up by herself.  Since last visit she again points to a couple areas in her right and left breast which were bothersome to her.  She tells me that these areas were bothersome even before the MRI and the fact that MRI was unremarkable is reassuring to her.  She denies any new medications.  She has been having some heavy menstrual cycles and has a follow-up with her gynecologist next  month. Rest of the pertinent review of systems reviewed and negative.  REVIEW OF SYSTEMS:   Constitutional: Denies fevers, chills or abnormal night sweats Eyes: Denies blurriness of vision, double vision or watery eyes Ears, nose, mouth, throat, and face: Denies mucositis or sore throat Respiratory: Denies cough, dyspnea or wheezes Cardiovascular: Denies palpitation, chest discomfort or lower extremity swelling Gastrointestinal:  Denies nausea, heartburn or change in bowel habits Skin: Denies abnormal skin rashes Lymphatics: Denies new lymphadenopathy or easy bruising Neurological:Denies numbness, tingling or new weaknesses Behavioral/Psych: Mood is stable, no new changes  All other systems were reviewed with the patient and are negative.   MEDICAL HISTORY:  Past Medical History:  Diagnosis Date   Abnormal Pap smear of cervix    Allergy    Anemia    suspected poor diet; vegetarian during teens   Anxiety    Asthma    childhood   Cancer ([place])    Depression    Dysmenorrhea    Family history of breast cancer    Family history of kidney cancer    History of iron deficiency anemia    age 7   Joint pain    Leukocytosis    Low vitamin D level    Migraine with aura    never on rx or saw neurology   Ovarian cyst    Left   Pneumonia 01/2012    left lower lobe   Pre-diabetes 2015   had a "borderline" A1c   STD (sexually transmitted disease)    HSV1   Wears contact lenses    Wears glasses     SURGICAL HISTORY: Past Surgical History:  Procedure Laterality Date   BREAST BIOPSY Right 04/2020   benign   COLPOSCOPY     LAPAROSCOPIC OVARIAN CYSTECTOMY Left 11/13/2019   Procedure: LAPAROSCOPIC OVARIAN CYSTECTOMY/POSSIBLE LEFT OOPHORECTOMY/ COLLECTION OF PELVIC WASHINGS/ks;  Surgeon: Nunzio Cobbs, MD;  Location: [facility];  Service: Gynecology;  Laterality: Left;  Possible left oophorectomy Colection of pelvic washings To follow first case of the  day   LEFT OOPHORECTOMY Left 11/2019   TONSILLECTOMY     UPPER GI ENDOSCOPY      SOCIAL HISTORY: Social History   Socioeconomic History   Marital status: Married    Spouse name: Not on file   Number of children: Not on file   Years of education: Not on file   Highest education level: Not on file  Occupational History   Not on file  Tobacco Use   Smoking status: Former    Packs/day: 1.00    Types: Cigarettes   Smokeless tobacco: Never  Vaping Use   Vaping Use: Never used  Substance and Sexual Activity   Alcohol use: Not Currently   Drug use: No   Sexual activity: Yes    Partners: Male    Birth control/protection: Condom    Comment: everytime  Other Topics Concern   Not on file  Social History  Narrative   Moved from [place]   Married   Social Determinants of Health   Financial Resource Strain: Not on file  Food Insecurity: Not on file  Transportation Needs: Not on file  Physical Activity: Not on file  Stress: Not on file  Social Connections: Not on file  Intimate Partner Violence: Not on file    FAMILY HISTORY: Family History  Problem Relation Age of Onset   Diabetes Mother    Hypertension Mother    Stroke Mother        TIA   Factor V Leiden deficiency Mother    Other Mother        pituitary tumor   Crohn's disease Father    Kidney cancer Father        cancerous tumor of kidney   Liver disease Father    Breast cancer Maternal Grandmother        dx. 50s   Stroke Maternal Grandfather    Other Brother        ear tumor, dx. age 75-6   Rheum arthritis Maternal Great-grandmother    Cancer Other    Ovarian cancer Neg Hx    Endometrial cancer Neg Hx    Colon cancer Neg Hx     ALLERGIES:  is allergic to shellfish allergy, other, latex, and sulfa antibiotics.  MEDICATIONS:  Current Outpatient Medications  Medication Sig Dispense Refill   busPIRone (BUSPAR) 5 MG tablet Take 1 tablet (5 mg total) by mouth 3 (three)  times daily as needed (anxiety). 90 tablet 1   cyanocobalamin 1000 MCG tablet Take 1,000 mcg by mouth daily.     Fexofenadine-Pseudoephedrine (ALLEGRA-D 24 HOUR PO) Take 1 tablet by mouth as needed. (Patient not taking: Reported on 08/07/2021)     Multiple Vitamins-Minerals (MULTIPLE VITAMINS/WOMENS PO) Take 1 tablet by mouth daily in the afternoon.     valACYclovir (VALTREX) 1000 MG tablet Take 2 tablets (2000 mg) by mouth twice a day for 24 hours. (Patient taking differently: as needed. Take 2 tablets (2000 mg) by mouth twice a day for 24 hours as needed for cold sore) 30 tablet 1   No current facility-administered medications for this visit.    PHYSICAL EXAMINATION: ECOG PERFORMANCE STATUS: 0 - Asymptomatic  Vitals:   10/14/21 1543  BP: 114/63  Pulse: 80  Resp: 18  Temp: 97.7 F (36.5 C)  SpO2: 96%     Filed Weights   10/14/21 1543  Weight: 137 lb 3.2 oz (62.2 kg)      GENERAL:alert, no distress and comfortable Focused Breast exam: Bilateral breasts appear normal to inspection. No palpable cervical or pre auricular LN. The very small lymph node that she feels right below the mandible is physiologic. Bilateral breasts appear normal to inspection, right intramammary lymph node felt on palpation which was thought to be benign.  I did not feel any other masses concerning for breast cancer.  No palpable regional pathologic lymphadenopathy  LABORATORY DATA:  I have reviewed the data as listed Lab Results  Component Value Date   WBC 6.1 07/06/2021   HGB 12.9 07/06/2021   HCT 38.7 07/06/2021   MCV 87.9 07/06/2021   PLT 225.0 07/06/2021     Chemistry      Component Value Date/Time   NA 140 07/06/2021 0918   NA 139 10/11/2019 1013   K 4.0 07/06/2021 0918   CL 105 07/06/2021 0918   CO2 27 07/06/2021 0918   BUN 10 07/06/2021  0918   BUN 12 10/11/2019 1013   CREATININE 0.73 07/06/2021 0918   CREATININE 0.75 09/04/2020 1150      Component Value Date/Time   CALCIUM 9.3  07/06/2021 0918   ALKPHOS 48 07/06/2021 0918   AST 13 07/06/2021 0918   AST 11 (L) 09/04/2020 1150   ALT 11 07/06/2021 0918   ALT 8 09/04/2020 1150   BILITOT 0.3 07/06/2021 0918   BILITOT 0.3 09/04/2020 1150      RADIOGRAPHIC STUDIES: I have personally reviewed the radiological images as listed and agreed with the findings in the report. No results found.  I spent 20 minutes in the care of this patient including history, physical examination, review of records, counseling and documentation.  All questions were answered. The patient knows to call the clinic with any problems, questions or concerns. She will follow-up with [place] in about 6 months    Benay Pike, MD 10/14/2021 4:09 PM

## 2021-11-04 NOTE — Progress Notes (Signed)
36 y.o. G9P0000 Married Caucasian female here for annual exam.    Emotional changes 1 week prior to cycles--sadness and acne. Menses every 25 - 27 days.  Has clots 1/2 dollar size or heavy bleeding for the first couple days of her cycle. A couple of times in the past year, has minor spotting a week prior to her menstruation.   Saw Dr. Berline Lopes in November, 2022.  FSH 5.9 and estradiol 84.7 on 08/24/21. Patient has declined OCPs. She follows up with her every 6 months.   Saw Dermatology also and recommended Spironolactone to treat acne.   Is in therapy.  On Buspar through her PCP.  Uses as needed. In high school, used Prozac in the past.  It stopped working after a while.  Thinks she may have been on Zoloft in her 86s for a short period of time.   Remembers having size effects.   Back in [place] for one year.   PCP: [name], PA  Patient's last menstrual period was 10/14/2021 (exact date).     Period Cycle (Days): 28 Period Duration (Days): 5--7 days Period Pattern: Regular Menstrual Flow:  (first 2 days very heavy with large clots then tapers) Menstrual Control:  (menstrual cup and menstrual panties) Dysmenorrhea: (!) Severe (severe cramps first day) Dysmenorrhea Symptoms: Headache     Sexually active: Yes.    The current method of family planning is Vasectomy/condoms most of the time.    Exercising: Yes.     Pilates and walking Smoker:  Former  Health Maintenance: Pap:   03-28-18 Neg:Neg HR HPV, 2015 ?Neg History of abnormal Pap:  yes, Hx of colposcopy 2007. No treatment MMG:  09-04-21 MR Br.Bil/Neg/Birads1/Bil.screening MRI breast 57yr Colonoscopy:  n/a BMD:   n/a  Result  n/a TDaP:  PCP Gardasil:   yes, completed HIV: 10-13-20 NR Hep C: 10-13-20 Screening Labs:   Flu vaccine:  completed Covid boosters:  completed.   reports that she has quit smoking. Her smoking use included cigarettes. She smoked an average of 1 pack per day. She has never used smokeless tobacco.  She reports that she does not currently use alcohol. She reports that she does not use drugs.  Past Medical History:  Diagnosis Date   Abnormal Pap smear of cervix    Allergy    Anemia    suspected poor diet; vegetarian during teens   Anxiety    Asthma    childhood   Cancer ([place])    Depression    Dysmenorrhea    Family history of breast cancer    Family history of kidney cancer    History of iron deficiency anemia    age 21   Joint pain    Labral tear of right hip joint    Leukocytosis    Low vitamin D level    Migraine with aura    never on rx or saw neurology   Ovarian cyst    Left   Pneumonia 01/2012    left lower lobe   Pre-diabetes 2015   had a "borderline" A1c   STD (sexually transmitted disease)    HSV1   Wears contact lenses    Wears glasses     Past Surgical History:  Procedure Laterality Date   BREAST BIOPSY Right 04/2020   benign   COLPOSCOPY     LAPAROSCOPIC OVARIAN CYSTECTOMY Left 11/13/2019   Procedure: LAPAROSCOPIC OVARIAN CYSTECTOMY/POSSIBLE LEFT OOPHORECTOMY/ COLLECTION OF PELVIC WASHINGS/ks;  Surgeon: Nunzio Cobbs, MD;  Location:  [facility];  Service: Gynecology;  Laterality: Left;  Possible left oophorectomy Colection of pelvic washings To follow first case of the day   LEFT OOPHORECTOMY Left 11/2019   TONSILLECTOMY     UPPER GI ENDOSCOPY      Current Outpatient Medications  Medication Sig Dispense Refill   busPIRone (BUSPAR) 5 MG tablet Take 1 tablet (5 mg total) by mouth 3 (three) times daily as needed (anxiety). 90 tablet 1   cyanocobalamin 1000 MCG tablet Take 1,000 mcg by mouth daily.     escitalopram (LEXAPRO) 10 MG tablet Take 1 tablet (10 mg total) by mouth daily. 30 tablet 1   Fexofenadine-Pseudoephedrine (ALLEGRA-D 24 HOUR PO) Take 1 tablet by mouth as needed.     Multiple Vitamins-Minerals (MULTIPLE VITAMINS/WOMENS PO) Take 1 tablet by mouth daily in the afternoon.     valACYclovir (VALTREX) 1000 MG  tablet Take 2 tablets (2000 mg) by mouth twice a day for 24 hours as needed for cold sore. 30 tablet 1   No current facility-administered medications for this visit.    Family History  Problem Relation Age of Onset   Diabetes Mother    Hypertension Mother    Stroke Mother        TIA   Factor V Leiden deficiency Mother    Other Mother        pituitary tumor   Crohn's disease Father    Kidney cancer Father        cancerous tumor of kidney   Liver disease Father    Breast cancer Maternal Grandmother        dx. 20s   Stroke Maternal Grandfather    Other Brother        ear tumor, dx. age 20-6   Rheum arthritis Maternal Great-grandmother    Cancer Other    Ovarian cancer Neg Hx    Endometrial cancer Neg Hx    Colon cancer Neg Hx     Review of Systems  All other systems reviewed and are negative.  Exam:   BP 110/64    Pulse 66    Ht 5\' 7"  (1.702 m)    Wt 132 lb (59.9 kg)    LMP 10/14/2021 (Exact Date)    SpO2 98%    BMI 20.67 kg/m     General appearance: alert, cooperative and appears stated age Head: normocephalic, without obvious abnormality, atraumatic Neck: no adenopathy, supple, symmetrical, trachea midline and thyroid normal to inspection and palpation Lungs: clear to auscultation bilaterally Breasts: right - normal appearance, 1.5 cm mass at 6:00, no tenderness, No nipple retraction or dimpling, No nipple discharge or bleeding, No axillary adenopathy Left - normal appearance, 1 cm mass at 6:00, no tenderness, No nipple retraction or dimpling, No nipple discharge or bleeding, No axillary adenopathy Heart: regular rate and rhythm Abdomen: soft, non-tender; no masses, no organomegaly Extremities: extremities normal, atraumatic, no cyanosis or edema Skin: skin color, texture, turgor normal. No rashes or lesions Lymph nodes: cervical, supraclavicular, and axillary nodes normal. Neurologic: grossly normal  Pelvic: External genitalia:  no lesions              No abnormal  inguinal nodes palpated.              Urethra:  normal appearing urethra with no masses, tenderness or lesions              Bartholins and Skenes: normal  Vagina: normal appearing vagina with normal color and discharge, no lesions              Cervix: no lesions              Pap taken: no Bimanual Exam:  Uterus:  normal size, contour, position, consistency, mobility, non-tender              Adnexa: no mass, fullness, tenderness     Chaperone was present for exam:  Estill Bamberg, CMA  Assessment:   Well woman visit with gynecologic exam. Status post laparoscopic left oophorectomy.  Granulosa cell tumor of left ovary.  At increased risk for breast cancer.  FH breast cancer. Migraine with aura. Bilateral breast lumps right 1.5 cm and left 1 cm.  Anxiety.  PMDD.   Plan: Mammogram screening and breast MRI alternating every 6 months.  Self breast awareness reviewed. Pap and HR HPV as above. Guidelines for Calcium, Vitamin D, regular exercise program including cardiovascular and weight bearing exercise. We discussed tx options for PMDD - SSRI, COCs, increased awareness of emotional changes during this time, increased exercise.  I do not recommend COCs due to migraine with aur.  She will start Lexapro 10 mg daily.  Instructed in use.  Potential side effects discussed.  Fu for a recheck of Lexapro and breast recheck in 6 weeks.   After visit summary provided.   In addition to annual exam, we spent 20 minutes discussing anxiety and PMDD and developing a treatment plan.

## 2021-11-05 ENCOUNTER — Encounter: Payer: Self-pay | Admitting: Obstetrics and Gynecology

## 2021-11-05 ENCOUNTER — Ambulatory Visit (INDEPENDENT_AMBULATORY_CARE_PROVIDER_SITE_OTHER): Payer: Commercial Managed Care - PPO | Admitting: Obstetrics and Gynecology

## 2021-11-05 ENCOUNTER — Encounter: Payer: Self-pay | Admitting: Family Medicine

## 2021-11-05 ENCOUNTER — Other Ambulatory Visit: Payer: Self-pay

## 2021-11-05 VITALS — BP 110/64 | HR 66 | Ht 67.0 in | Wt 132.0 lb

## 2021-11-05 DIAGNOSIS — Z01419 Encounter for gynecological examination (general) (routine) without abnormal findings: Secondary | ICD-10-CM

## 2021-11-05 DIAGNOSIS — N6310 Unspecified lump in the right breast, unspecified quadrant: Secondary | ICD-10-CM | POA: Diagnosis not present

## 2021-11-05 DIAGNOSIS — N6320 Unspecified lump in the left breast, unspecified quadrant: Secondary | ICD-10-CM | POA: Diagnosis not present

## 2021-11-05 DIAGNOSIS — F419 Anxiety disorder, unspecified: Secondary | ICD-10-CM | POA: Diagnosis not present

## 2021-11-05 DIAGNOSIS — F3281 Premenstrual dysphoric disorder: Secondary | ICD-10-CM | POA: Diagnosis not present

## 2021-11-05 MED ORDER — VALACYCLOVIR HCL 1 G PO TABS: ORAL_TABLET | ORAL | 1 refills | Status: DC

## 2021-11-05 MED ORDER — ESCITALOPRAM OXALATE 10 MG PO TABS: 10.0000 mg | ORAL_TABLET | Freq: Every day | ORAL | 1 refills | Status: DC

## 2021-11-05 NOTE — Patient Instructions (Signed)
Escitalopram Tablets What is this medication? ESCITALOPRAM (es sye TAL oh pram) treats depression and anxiety. It increases the amount of serotonin in the brain, a hormone that helps regulate mood. It belongs to a group of medications called SSRIs. This medicine may be used for other purposes; ask your health care provider or pharmacist if you have questions. COMMON BRAND NAME(S): Lexapro What should I tell my care team before I take this medication? They need to know if you have any of these conditions: Bipolar disorder or a family history of bipolar disorder Diabetes Glaucoma Heart disease Kidney or liver disease Receiving electroconvulsive therapy Seizures Suicidal thoughts, plans, or attempt by you or a family member An unusual or allergic reaction to escitalopram, the related medication citalopram, other medications, foods, dyes, or preservatives Pregnant or trying to become pregnant Breast-feeding How should I use this medication? Take this medication by mouth with a glass of water. Follow the directions on the prescription label. You can take it with or without food. If it upsets your stomach, take it with food. Take your medication at regular intervals. Do not take it more often than directed. Do not stop taking this medication suddenly except upon the advice of your care team. Stopping this medication too quickly may cause serious side effects or your condition may worsen. A special MedGuide will be given to you by the pharmacist with each prescription and refill. Be sure to read this information carefully each time. Talk to your care team regarding the use of this medication in children. Special care may be needed. Overdosage: If you think you have taken too much of this medicine contact a poison control center or emergency room at once. NOTE: This medicine is only for you. Do not share this medicine with others. What if I miss a dose? If you miss a dose, take it as soon as you  can. If it is almost time for your next dose, take only that dose. Do not take double or extra doses. What may interact with this medication? Do not take this medication with any of the following: Certain medications for fungal infections like fluconazole, itraconazole, ketoconazole, posaconazole, voriconazole Cisapride Citalopram Dronedarone Linezolid MAOIs like Carbex, Eldepryl, Marplan, Nardil, and Parnate Methylene blue (injected into a vein) Pimozide Thioridazine This medication may also interact with the following: Alcohol Amphetamines Aspirin and aspirin-like medications Carbamazepine Certain medications for depression, anxiety, or psychotic disturbances Certain medications for migraine headache like almotriptan, eletriptan, frovatriptan, naratriptan, rizatriptan, sumatriptan, zolmitriptan Certain medications for sleep Certain medications that treat or prevent blood clots like warfarin, enoxaparin, dalteparin Cimetidine Diuretics Dofetilide Fentanyl Furazolidone Isoniazid Lithium Metoprolol NSAIDs, medications for pain and inflammation, like ibuprofen or naproxen Other medications that prolong the QT interval (cause an abnormal heart rhythm) Procarbazine Rasagiline Supplements like St. John's wort, kava kava, valerian Tramadol Tryptophan Ziprasidone This list may not describe all possible interactions. Give your health care provider a list of all the medicines, herbs, non-prescription drugs, or dietary supplements you use. Also tell them if you smoke, drink alcohol, or use illegal drugs. Some items may interact with your medicine. What should I watch for while using this medication? Tell your care team if your symptoms do not get better or if they get worse. Visit your care team for regular checks on your progress. Because it may take several weeks to see the full effects of this medication, it is important to continue your treatment as prescribed by your care  team. Watch for new or worsening  thoughts of suicide or depression. This includes sudden changes in mood, behaviors, or thoughts. These changes can happen at any time but are more common in the beginning of treatment or after a change in dose. Call your care team right away if you experience these thoughts or worsening depression. Manic episodes may happen in patients with bipolar disorder who take this medication. Watch for changes in feelings or behaviors such as feeling anxious, nervous, agitated, panicky, irritable, hostile, aggressive, impulsive, severely restless, overly excited and hyperactive, or trouble sleeping. These symptoms can happen at any time but are more common in the beginning of treatment or after a change in dose. Call your care team right away if you notice any of these symptoms. You may get drowsy or dizzy. Do not drive, use machinery, or do anything that needs mental alertness until you know how this medication affects you. Do not stand or sit up quickly, especially if you are an older patient. This reduces the risk of dizzy or fainting spells. Alcohol may interfere with the effect of this medication. Avoid alcoholic drinks. Your mouth may get dry. Chewing sugarless gum or sucking hard candy, and drinking plenty of water may help. Contact your care team if the problem does not go away or is severe. What side effects may I notice from receiving this medication? Side effects that you should report to your care team as soon as possible: Allergic reactions--skin rash, itching, hives, swelling of the face, lips, tongue, or throat Bleeding--bloody or black, tar-like stools, red or dark brown urine, vomiting blood or brown material that looks like coffee grounds, small, red or purple spots on skin, unusual bleeding or bruising Heart rhythm changes--fast or irregular heartbeat, dizziness, feeling faint or lightheaded, chest pain, trouble breathing Low sodium level--muscle weakness, fatigue,  dizziness, headache, confusion Serotonin syndrome--irritability, confusion, fast or irregular heartbeat, muscle stiffness, twitching muscles, sweating, high fever, seizure, chills, vomiting, diarrhea Sudden eye pain or change in vision such as blurry vision, seeing halos around lights, vision loss Thoughts of suicide or self-harm, worsening mood, feelings of depression Side effects that usually do not require medical attention (report to your care team if they continue or are bothersome): Change in sex drive or performance Diarrhea Excessive sweating Nausea Tremors or shaking Upset stomach This list may not describe all possible side effects. Call your doctor for medical advice about side effects. You may report side effects to FDA at 1-800-FDA-1088. Where should I keep my medication? Keep out of reach of children and pets. Store at room temperature between 15 and 30 degrees C (59 and 86 degrees F). Throw away any unused medication after the expiration date. NOTE: This sheet is a summary. It may not cover all possible information. If you have questions about this medicine, talk to your doctor, pharmacist, or health care provider.  2022 Elsevier/Gold Standard (2020-09-08 00:00:00)    EXERCISE AND DIET:  We recommended that you start or continue a regular exercise program for good health. Regular exercise means any activity that makes your heart beat faster and makes you sweat.  We recommend exercising at least 30 minutes per day at least 3 days a week, preferably 4 or 5.  We also recommend a diet low in fat and sugar.  Inactivity, poor dietary choices and obesity can cause diabetes, heart attack, stroke, and kidney damage, among others.    ALCOHOL AND SMOKING:  Women should limit their alcohol intake to no more than 7 drinks/beers/glasses of wine (combined, not each!)  per week. Moderation of alcohol intake to this level decreases your risk of breast cancer and liver damage. And of course, no  recreational drugs are part of a healthy lifestyle.  And absolutely no smoking or even second hand smoke. Most people know smoking can cause heart and lung diseases, but did you know it also contributes to weakening of your bones? Aging of your skin?  Yellowing of your teeth and nails?  CALCIUM AND VITAMIN D:  Adequate intake of calcium and Vitamin D are recommended.  The recommendations for exact amounts of these supplements seem to change often, but generally speaking 600 mg of calcium (either carbonate or citrate) and 800 units of Vitamin D per day seems prudent. Certain women may benefit from higher intake of Vitamin D.  If you are among these women, your doctor will have told you during your visit.    PAP SMEARS:  Pap smears, to check for cervical cancer or precancers,  have traditionally been done yearly, although recent scientific advances have shown that most women can have pap smears less often.  However, every woman still should have a physical exam from her gynecologist every year. It will include a breast check, inspection of the vulva and vagina to check for abnormal growths or skin changes, a visual exam of the cervix, and then an exam to evaluate the size and shape of the uterus and ovaries.  And after 37 years of age, a rectal exam is indicated to check for rectal cancers. We will also provide age appropriate advice regarding health maintenance, like when you should have certain vaccines, screening for sexually transmitted diseases, bone density testing, colonoscopy, mammograms, etc.   MAMMOGRAMS:  All women over 71 years old should have a yearly mammogram. Many facilities now offer a "3D" mammogram, which may cost around $50 extra out of pocket. If possible,  we recommend you accept the option to have the 3D mammogram performed.  It both reduces the number of women who will be called back for extra views which then turn out to be normal, and it is better than the routine mammogram at detecting  truly abnormal areas.    COLONOSCOPY:  Colonoscopy to screen for colon cancer is recommended for all women at age 54.  We know, you hate the idea of the prep.  We agree, BUT, having colon cancer and not knowing it is worse!!  Colon cancer so often starts as a polyp that can be seen and removed at colonscopy, which can quite literally save your life!  And if your first colonoscopy is normal and you have no family history of colon cancer, most women don't have to have it again for 10 years.  Once every ten years, you can do something that may end up saving your life, right?  We will be happy to help you get it scheduled when you are ready.  Be sure to check your insurance coverage so you understand how much it will cost.  It may be covered as a preventative service at no cost, but you should check your particular policy.

## 2021-11-23 NOTE — Progress Notes (Signed)
° °  I, Peterson Lombard, LAT, ATC acting as a scribe for [name], MD.  Kathryn Park is a 36 y.o. female who presents to [place] at [facility] today for f/u of B hip pain, R>L, due to hip labral pathology.  She was last seen by Dr. [name] on 07/22/21 to review her R hip MRI results and was ultimately referred to Dr. Aretha Parrot for surgical consultation who she saw on 09/07/21.  Today, pt reports tentatively scheduling for July for the R hip surgery which would be over the summer when her husband is off school and could help out. Pt did some strengthening and pilates over this time and notes some improvement. Pt has a new pain, that feels like a stabbing pain over the sacrum/coccyx that's been ongoing intermittently since Oct.   Dx imaging: 07/20/21 R hip MRI 12/11/20 B hip XR Pertinent review of systems: No fevers or chills  Relevant historical information: History of granulosa cell tumor of the left ovary. Right hip impingement and labrum tear.  Exam:  BP 120/76    Pulse 80    Ht 5\' 7"  (1.702 m)    Wt 132 lb 9.6 oz (60.1 kg)    LMP 11/10/2021    SpO2 98%    BMI 20.77 kg/m  General: Well Developed, well nourished, and in no acute distress.   MSK: Coccyx: Tender palpation at coccyx.  Normal hip motion. Normal gait.    Lab and Radiology Results  X-ray images sacrum and coccyx obtained today personally and independently interpreted No acute fracture of the coccyx. Await formal radiology review     Assessment and Plan: 36 y.o. female with coccydynia.  I think Harleyquinn is leaning back putting excessive pressure on her sacrum and coccyx in an effort to reduce her hip flexion because of her hip impingement and thereby causing some coccydynia.  I think she will do well to reduce her pressure by using a comfortable coccyx offloading cushion as a first-line step.  I reviewed with her online some options that may be appealing.  I think this plus Voltaren gel  should be a good idea.  If not improving enough then conventional physical therapy followed by pelvic physical therapy should be neck steps.  I also spent time talking about her upcoming hip scope for her labrum tear.  She is feeling a bit better and has some questions.  I tried my best to answer them but I think really she should ask Dr. Aretha Parrot.   PDMP not reviewed this encounter. Orders Placed This Encounter  Procedures   DG Sacrum/Coccyx    Standing Status:   Future    Number of Occurrences:   1    Standing Expiration Date:   11/24/2022    Order Specific Question:   Reason for Exam (SYMPTOM  OR DIAGNOSIS REQUIRED)    Answer:   coccyx pain    Order Specific Question:   Is patient pregnant?    Answer:   No    Order Specific Question:   Preferred imaging location?    Answer:   [facility]   No orders of the defined types were placed in this encounter.    Discussed warning signs or symptoms. Please see discharge instructions. Patient expresses understanding.   The above documentation has been reviewed and is accurate and complete [name], M.D.

## 2021-11-24 ENCOUNTER — Ambulatory Visit: Payer: Self-pay

## 2021-11-24 ENCOUNTER — Ambulatory Visit: Payer: Commercial Managed Care - PPO | Admitting: Family Medicine

## 2021-11-24 ENCOUNTER — Ambulatory Visit (INDEPENDENT_AMBULATORY_CARE_PROVIDER_SITE_OTHER): Payer: Commercial Managed Care - PPO

## 2021-11-24 ENCOUNTER — Other Ambulatory Visit: Payer: Self-pay

## 2021-11-24 VITALS — BP 120/76 | HR 80 | Ht 67.0 in | Wt 132.6 lb

## 2021-11-24 DIAGNOSIS — M533 Sacrococcygeal disorders, not elsewhere classified: Secondary | ICD-10-CM

## 2021-11-24 DIAGNOSIS — G8929 Other chronic pain: Secondary | ICD-10-CM

## 2021-11-24 DIAGNOSIS — M25551 Pain in right hip: Secondary | ICD-10-CM | POA: Diagnosis not present

## 2021-11-24 NOTE — Patient Instructions (Addendum)
Thank you for coming in today.   Coccyodynia  Coccyx cushion.   PT if needed.   Recheck as needed.   Send Dr Aretha Parrot a mychart message.

## 2021-11-25 NOTE — Progress Notes (Signed)
Coccyx x-ray looks normal to radiology.

## 2021-12-07 NOTE — Progress Notes (Unsigned)
GYNECOLOGY  VISIT   HPI: 36 y.o.   Married  {Race/ethnicity:[id number]}  female   [id number] with No LMP recorded.   here for     GYNECOLOGIC HISTORY: No LMP recorded. Contraception:  *** Menopausal hormone therapy:  N/A Last mammogram:  02-19-21 normal Last pap smear:   03-28-18 normal Neg HPV        OB History     Gravida  0   Para  0   Term  0   Preterm  0   AB  0   Living  0      SAB  0   IAB  0   Ectopic  0   Multiple  0   Live Births  0              Patient Active Problem List   Diagnosis Date Noted   Tear of right acetabular labrum 07/22/2021   Breast lump 02/12/2021   Bilateral hip pain 11/25/2020   Positive ANA (antinuclear antibody) 11/25/2020   Other fatigue 11/25/2020   Bladder retention 10/13/2020   HSV-1 (herpes simplex virus 1) infection 10/13/2020   History of anorexia nervosa 10/13/2020   GAD (generalized anxiety disorder) 10/13/2020   Family history of breast cancer    Family history of kidney cancer    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    Past Medical History:  Diagnosis Date   Abnormal Pap smear of cervix    Allergy    Anemia    suspected poor diet; vegetarian during teens   Anxiety    Asthma    childhood   Cancer ([place])    Depression    Dysmenorrhea    Family history of breast cancer    Family history of kidney cancer    History of iron deficiency anemia    age 71   Joint pain    Labral tear of right hip joint    Leukocytosis    Low vitamin D level    Migraine with aura    never on rx or saw neurology   Ovarian cyst    Left   Pneumonia 01/2012    left lower lobe   Pre-diabetes 2015   had a "borderline" A1c   STD (sexually transmitted disease)    HSV1   Wears contact lenses    Wears glasses     Past Surgical History:  Procedure Laterality Date   BREAST BIOPSY Right 04/2020   benign   COLPOSCOPY     LAPAROSCOPIC OVARIAN CYSTECTOMY Left 11/13/2019   Procedure: LAPAROSCOPIC OVARIAN CYSTECTOMY/POSSIBLE  LEFT OOPHORECTOMY/ COLLECTION OF PELVIC WASHINGS/ks;  Surgeon: Nunzio Cobbs, MD;  [facility];  Service: Gynecology;  Laterality: Left;  Possible left oophorectomy Colection of pelvic washings To follow first case of the day   LEFT OOPHORECTOMY Left 11/2019   TONSILLECTOMY     UPPER GI ENDOSCOPY      Current Outpatient Medications  Medication Sig Dispense Refill   busPIRone (BUSPAR) 5 MG tablet Take 1 tablet (5 mg total) by mouth 3 (three) times daily as needed (anxiety). 90 tablet 1   cyanocobalamin 1000 MCG tablet Take 1,000 mcg by mouth daily.     escitalopram (LEXAPRO) 10 MG tablet Take 1 tablet (10 mg total) by mouth daily. 30 tablet 1   Fexofenadine-Pseudoephedrine (ALLEGRA-D 24 HOUR PO) Take 1 tablet by mouth as needed.     Multiple Vitamins-Minerals (MULTIPLE VITAMINS/WOMENS PO) Take 1 tablet by mouth  daily in the afternoon.     valACYclovir (VALTREX) 1000 MG tablet Take 2 tablets (2000 mg) by mouth twice a day for 24 hours as needed for cold sore. 30 tablet 1   No current facility-administered medications for this visit.     ALLERGIES: Shellfish allergy, Other, Latex, and Sulfa antibiotics  Family History  Problem Relation Age of Onset   Diabetes Mother    Hypertension Mother    Stroke Mother        TIA   Factor V Leiden deficiency Mother    Other Mother        pituitary tumor   Crohn's disease Father    Kidney cancer Father        cancerous tumor of kidney   Liver disease Father    Breast cancer Maternal Grandmother        dx. 23s   Stroke Maternal Grandfather    Other Brother        ear tumor, dx. age 63-6   Rheum arthritis Maternal Great-grandmother    Cancer Other    Ovarian cancer Neg Hx    Endometrial cancer Neg Hx    Colon cancer Neg Hx     Social History   Socioeconomic History   Marital status: Married    Spouse name: Not on file   Number of children: Not on file   Years of education: Not on file   Highest  education level: Not on file  Occupational History   Not on file  Tobacco Use   Smoking status: Former    Packs/day: 1.00    Types: Cigarettes   Smokeless tobacco: Never  Vaping Use   Vaping Use: Never used  Substance and Sexual Activity   Alcohol use: Not Currently   Drug use: No   Sexual activity: Yes    Partners: Male    Birth control/protection: Condom, Other-see comments    Comment: vasectomy  Other Topics Concern   Not on file  Social History Narrative   Moved from [place]   Married   Social Determinants of Health   Financial Resource Strain: Not on file  Food Insecurity: Not on file  Transportation Needs: Not on file  Physical Activity: Not on file  Stress: Not on file  Social Connections: Not on file  Intimate Partner Violence: Not on file    Review of Systems  PHYSICAL EXAMINATION:    There were no vitals taken for this visit.    General appearance: alert, cooperative and appears stated age Head: Normocephalic, without obvious abnormality, atraumatic Neck: no adenopathy, supple, symmetrical, trachea midline and thyroid normal to inspection and palpation Lungs: clear to auscultation bilaterally Breasts: normal appearance, no masses or tenderness, No nipple retraction or dimpling, No nipple discharge or bleeding, No axillary or supraclavicular adenopathy Heart: regular rate and rhythm Abdomen: soft, non-tender, no masses,  no organomegaly Extremities: extremities normal, atraumatic, no cyanosis or edema Skin: Skin color, texture, turgor normal. No rashes or lesions Lymph nodes: Cervical, supraclavicular, and axillary nodes normal. No abnormal inguinal nodes palpated Neurologic: Grossly normal  Pelvic: External genitalia:  no lesions              Urethra:  normal appearing urethra with no masses, tenderness or lesions              Bartholins and Skenes: normal                 Vagina: normal appearing  vagina with normal  color and discharge, no lesions              Cervix: no lesions                Bimanual Exam:  Uterus:  normal size, contour, position, consistency, mobility, non-tender              Adnexa: no mass, fullness, tenderness              Rectal exam: {yes no:[id number]}.  Confirms.              Anus:  normal sphincter tone, no lesions  Chaperone was present for exam:  ***  ASSESSMENT     PLAN     An After Visit Summary was printed and given to the patient.  ______ minutes face to face time of which over 50% was spent in counseling.

## 2021-12-15 ENCOUNTER — Telehealth: Payer: Self-pay

## 2021-12-15 NOTE — Telephone Encounter (Signed)
Received call from patient this afternoon. Patient requesting to reschedule her May follow up appointment with Dr. Berline Lopes. Patient is having hip surgery 01/21/22 and would like her appointment with Dr. Berline Lopes prior to this.  ? ?Appointment rescheduled to 01/18/22 at 1:45 pm. Patient is in agreement of appointment date and time.  ? ?Patient states " I don't see anything in my mychart about labs with this appointment or an ultrasound. Am I supposed too have those?" Advised patient that I would look into this and someone from the office will call and follow up with her. Patient verbalized understanding.  ?

## 2021-12-17 ENCOUNTER — Other Ambulatory Visit: Payer: Self-pay | Admitting: Gynecologic Oncology

## 2021-12-17 ENCOUNTER — Telehealth: Payer: Self-pay

## 2021-12-17 NOTE — Telephone Encounter (Signed)
Following up with patient this afternoon regarding our previous conversation about lab work and an ultrasound prior to her appointment with Dr. Berline Lopes on 01/18/22.  ?Dr. Berline Lopes would like her to have a pelvic ultrasound and have her inhibin B and AMH checked. ?Attempted to contact her to notify her of this and see what appointment times would work best with her schedule. ?Unable to contact patient, left message requesting a return call.  ?

## 2021-12-17 NOTE — Telephone Encounter (Signed)
Spoke with patient this afternoon and notified her that Dr. Berline Lopes would like her to have a pelvic ultrasound and have her AMH and Inhibin B checked. Patient is agreeable to this. She would prefer the appointment be scheduled the week before her appointment with Dr. Berline Lopes (April 10-14th) and she prefers morning appointments. Advised patient that our office will work on scheduling this and notify her of appointment details. Patient verbalized understanding.  ?

## 2021-12-17 NOTE — Progress Notes (Signed)
[place] and labs ordered for upcoming appt. ?

## 2021-12-18 ENCOUNTER — Telehealth: Payer: Self-pay

## 2021-12-18 ENCOUNTER — Other Ambulatory Visit: Payer: Self-pay | Admitting: Obstetrics and Gynecology

## 2021-12-18 ENCOUNTER — Encounter: Payer: Self-pay | Admitting: Obstetrics and Gynecology

## 2021-12-18 ENCOUNTER — Telehealth: Payer: Self-pay | Admitting: Obstetrics and Gynecology

## 2021-12-18 ENCOUNTER — Other Ambulatory Visit: Payer: Self-pay

## 2021-12-18 ENCOUNTER — Telehealth: Payer: Self-pay | Admitting: *Deleted

## 2021-12-18 ENCOUNTER — Ambulatory Visit: Payer: Commercial Managed Care - PPO | Admitting: Obstetrics and Gynecology

## 2021-12-18 VITALS — BP 112/64

## 2021-12-18 DIAGNOSIS — R590 Localized enlarged lymph nodes: Secondary | ICD-10-CM

## 2021-12-18 DIAGNOSIS — N6320 Unspecified lump in the left breast, unspecified quadrant: Secondary | ICD-10-CM

## 2021-12-18 DIAGNOSIS — N6315 Unspecified lump in the right breast, overlapping quadrants: Secondary | ICD-10-CM

## 2021-12-18 DIAGNOSIS — N6310 Unspecified lump in the right breast, unspecified quadrant: Secondary | ICD-10-CM

## 2021-12-18 DIAGNOSIS — F3281 Premenstrual dysphoric disorder: Secondary | ICD-10-CM | POA: Diagnosis not present

## 2021-12-18 DIAGNOSIS — Z5181 Encounter for therapeutic drug level monitoring: Secondary | ICD-10-CM | POA: Diagnosis not present

## 2021-12-18 DIAGNOSIS — N6323 Unspecified lump in the left breast, lower outer quadrant: Secondary | ICD-10-CM

## 2021-12-18 MED ORDER — ESCITALOPRAM OXALATE 10 MG PO TABS: 10.0000 mg | ORAL_TABLET | Freq: Every day | ORAL | 3 refills | Status: DC

## 2021-12-18 NOTE — Telephone Encounter (Signed)
Pls refer to referral encounter on 12/18/21.  ?

## 2021-12-18 NOTE — Telephone Encounter (Signed)
Spoke with pt this afternoon regarding her pelvic u/s appointment. Informed pt that her u/s is scheduled for 01-11-2022 at 9am and to arrive 15 mins early with a full bladder. Also scheduled her for a lab appoint on 01-11-2022 @ 10:30am. Pt verbalized understanding and did not voice any concerns.  ?

## 2021-12-18 NOTE — Telephone Encounter (Signed)
Thank you for the update!

## 2021-12-18 NOTE — Telephone Encounter (Signed)
Please schedule [place] of neck - soft tissue for enlarging left cervical lymph node.  ? ?She previously had this done at [place].  ? ?She believes the area is enlarging further.  ?

## 2021-12-18 NOTE — Telephone Encounter (Signed)
Please schedule dx bilateral mammogram and bilateral breast US at the [place] for my patient.  ? ?She has bilateral breast lumps:  1.5 cm at 6:00 right breast and 1.0 cm at 5:00 left breast. ? ?You are also working on scheduling a neck soft tissue ultrasound for left cervical adenopathy.  ? ? ?

## 2021-12-18 NOTE — Telephone Encounter (Signed)
FYI. ?Order placed and appt scheduled for 01/04/22 @ 8:30am.  ? ?Left detailed msg on VM per DPR. Notifying pt of appt date at time. Also made her aware she can call daily for any cancellations to get in sooner.  ?

## 2021-12-18 NOTE — Telephone Encounter (Signed)
Per Dr. Quincy Simmonds ? ?"Please schedule [place] of neck - soft tissue for enlarging left cervical lymph node.  ?  ?She previously had this done at [place].  ?  ?She believes the area is enlarging further."  ? ?Placed order and left detailed msg on VM per DPR that order was placed and pt can call to schedule at her earliest convenience.  ?

## 2021-12-21 NOTE — Telephone Encounter (Signed)
FYI. Scheduled 12/22/21.  ?

## 2021-12-22 ENCOUNTER — Ambulatory Visit
Admission: RE | Admit: 2021-12-22 | Discharge: 2021-12-22 | Disposition: A | Discharge status: Home / Self Care | Payer: Commercial Managed Care - PPO | Source: Ambulatory Visit | Attending: Obstetrics and Gynecology | Admitting: Obstetrics and Gynecology

## 2021-12-22 DIAGNOSIS — R590 Localized enlarged lymph nodes: Secondary | ICD-10-CM

## 2021-12-23 ENCOUNTER — Ambulatory Visit (INDEPENDENT_AMBULATORY_CARE_PROVIDER_SITE_OTHER): Payer: Commercial Managed Care - PPO | Admitting: Physician Assistant

## 2021-12-23 ENCOUNTER — Encounter: Payer: Self-pay | Admitting: Physician Assistant

## 2021-12-23 VITALS — BP 110/56 | HR 69 | Temp 98.4°F | Ht 67.0 in | Wt 133.2 lb

## 2021-12-23 DIAGNOSIS — Z01818 Encounter for other preprocedural examination: Secondary | ICD-10-CM | POA: Diagnosis not present

## 2021-12-23 NOTE — Patient Instructions (Signed)
It was great to see you! ? ?Your EKG is overall normal. ?We will update your blood work and send note to surgeon accordingly. ? ?Best of luck, let us know if you need anything. ? ?Take care, ? ?[name] PA-C  ?

## 2021-12-23 NOTE — Progress Notes (Signed)
Kathryn Park is a 36 y.o. female here for a surgical clearance. ? ?History of Present Illness:  ? ?Chief Complaint  ?Patient presents with  ? Medical Clearance  ?  Pt stated that she is scheduled to have hip surgery 01/21/2022 and need a letter for medical clearance  ? ? ?HPI ? ?Surgical Clearance  ?Kathryn Park is set to undergo a right hip arthroscopy on 01/21/22 with Dr. Aretha Parrot, orthopedic surgery. Currently she presents with the desire to be cleared for surgery. Pt was found to have a labral tear in her right hip following MRI completed by Dr. [name], sports medicine. Since dx, she had been trying to complete strengthening exercises and piliates over time, but due to ongoing pain, she decided to have the surgery.  ? ?Denies CP or SOB upon exertion, known adverse effects to anesthesia, or LE swelling.  ? ?She had general anesthesia during her oophorectomy in 2021. She denies any significant concerns with anesthesia in the past. ? ?She currently denies any SOB, CP or other concerns with any physical activity. She is able to participate in strengthening exercises, pilates, gym. ? ?Past Medical History:  ?Diagnosis Date  ? Abnormal Pap smear of cervix   ? Allergy   ? Anemia   ? suspected poor diet; vegetarian during teens  ? Anxiety   ? Asthma   ? childhood  ? Cancer [facility])   ? Depression   ? Dysmenorrhea   ? Family history of breast cancer   ? Family history of kidney cancer   ? History of iron deficiency anemia   ? age 57  ? Joint pain   ? Labral tear of right hip joint   ? Leukocytosis   ? Low vitamin D level   ? Migraine with aura   ? never on rx or saw neurology  ? Ovarian cyst   ? Left  ? Pneumonia 01/2012  ?  left lower lobe  ? Pre-diabetes 2015  ? had a "borderline" A1c  ? STD (sexually transmitted disease)   ? HSV1  ? Wears contact lenses   ? Wears glasses   ? ?  ?Social History  ? ?Tobacco Use  ? Smoking status: Former  ?  Packs/day: 1.00  ?  Types: Cigarettes  ? Smokeless tobacco: Never   ?Vaping Use  ? Vaping Use: Never used  ?Substance Use Topics  ? Alcohol use: Not Currently  ? Drug use: No  ? ? ?Past Surgical History:  ?Procedure Laterality Date  ? BREAST BIOPSY Right 04/2020  ? benign  ? COLPOSCOPY    ? LAPAROSCOPIC OVARIAN CYSTECTOMY Left 11/13/2019  ? Procedure: LAPAROSCOPIC OVARIAN CYSTECTOMY/POSSIBLE LEFT OOPHORECTOMY/ COLLECTION OF PELVIC WASHINGS/ks;  Surgeon: Nunzio Cobbs, MD;  Location: [facility];  Service: Gynecology;  Laterality: Left;  Possible left oophorectomy ?Colection of pelvic washings ?To follow first case of the day  ? LEFT OOPHORECTOMY Left 11/2019  ? TONSILLECTOMY    ? UPPER GI ENDOSCOPY    ? ? ?Family History  ?Problem Relation Age of Onset  ? Diabetes Mother   ? Hypertension Mother   ? Stroke Mother   ?     TIA  ? Factor V Leiden deficiency Mother   ? Other Mother   ?     pituitary tumor  ? Crohn's disease Father   ? Kidney cancer Father   ?     cancerous tumor of kidney  ? Liver disease Father   ? Breast  cancer Maternal Grandmother   ?     dx. 88s  ? Stroke Maternal Grandfather   ? Other Brother   ?     ear tumor, dx. age 39-6  ? Rheum arthritis Maternal Great-grandmother   ? Cancer Other   ? Ovarian cancer Neg Hx   ? Endometrial cancer Neg Hx   ? Colon cancer Neg Hx   ? ? ?Allergies  ?Allergen Reactions  ? Shellfish Allergy Anaphylaxis  ?  Throat closes  ? Other Other (See Comments)  ?  Watermelon, mold/mildew, pet dander, dust and dust mites ? ?Swelling of eyes and congestion  ? Latex Rash  ? Sulfa Antibiotics Rash  ? ? ?Current Medications:  ? ?Current Outpatient Medications:  ?  busPIRone (BUSPAR) 5 MG tablet, Take 1 tablet (5 mg total) by mouth 3 (three) times daily as needed (anxiety)., Disp: 90 tablet, Rfl: 1 ?  cyanocobalamin 1000 MCG tablet, Take 1,000 mcg by mouth daily., Disp: , Rfl:  ?  escitalopram (LEXAPRO) 10 MG tablet, Take 1 tablet (10 mg total) by mouth daily. Take daily for 2 weeks leading up to menstrual cycle., Disp: 45  tablet, Rfl: 3 ?  Fexofenadine-Pseudoephedrine (ALLEGRA-D 24 HOUR PO), Take 1 tablet by mouth as needed., Disp: , Rfl:  ?  Multiple Vitamins-Minerals (MULTIPLE VITAMINS/WOMENS PO), Take 1 tablet by mouth daily in the afternoon., Disp: , Rfl:  ?  valACYclovir (VALTREX) 1000 MG tablet, Take 2 tablets (2000 mg) by mouth twice a day for 24 hours as needed for cold sore., Disp: 30 tablet, Rfl: 1  ? ?Review of Systems:  ? ?ROS ?Negative unless otherwise specified per HPI. ? ?Vitals:  ? ?Vitals:  ? 12/23/21 1431  ?BP: (!) 110/56  ?Pulse: 69  ?Temp: 98.4 ?F (36.9 ?C)  ?SpO2: 99%  ?Weight: 133 lb 3.2 oz (60.4 kg)  ?Height: '5\' 7"'$  (1.702 m)  ?   ?Body mass index is 20.86 kg/m?. ? ?Physical Exam:  ? ?Physical Exam ?Vitals and nursing note reviewed.  ?Constitutional:   ?   General: She is not in acute distress. ?   Appearance: She is well-developed. She is not ill-appearing or toxic-appearing.  ?Cardiovascular:  ?   Rate and Rhythm: Normal rate and regular rhythm.  ?   Pulses: Normal pulses.  ?   Heart sounds: Normal heart sounds, S1 normal and S2 normal.  ?Pulmonary:  ?   Effort: Pulmonary effort is normal.  ?   Breath sounds: Normal breath sounds.  ?Skin: ?   General: Skin is warm and dry.  ?Neurological:  ?   Mental Status: She is alert.  ?   GCS: GCS eye subscore is 4. GCS verbal subscore is 5. GCS motor subscore is 6.  ?Psychiatric:     ?   Speech: Speech normal.     ?   Behavior: Behavior normal. Behavior is cooperative.  ? ?Assessment and Plan:  ? ?Preoperative clearance ?EKG tracing is personally reviewed.  EKG notes NSR.  No acute changes.  ?Update labs, will make recommendations accordingly  ?Suspect patient is overall a good candidate for surgery and when labs return will write letter accordingly ?Follow up as needed  ? ?I,Havlyn C Ratchford,acting as a scribe for [employer], PA.,have documented all relevant documentation on the behalf of [name], PA,as directed by  [name], PA while in the  presence of [name], [place]. ? ?IInda Coke, PA, have reviewed all documentation for this visit. The documentation on 12/23/21 for  the exam, diagnosis, procedures, and orders are all accurate and complete. ? ?[name], PA-C ? ?

## 2021-12-24 LAB — COMPREHENSIVE METABOLIC PANEL
ALT: 11 U/L (ref 0–35)
AST: 14 U/L (ref 0–37)
Albumin: 4.4 g/dL (ref 3.5–5.2)
Alkaline Phosphatase: 46 U/L (ref 39–117)
BUN: 9 mg/dL (ref 6–23)
CO2: 31 mEq/L (ref 19–32)
Calcium: 9 mg/dL (ref 8.4–10.5)
Chloride: 104 mEq/L (ref 96–112)
Creatinine, Ser: 0.72 mg/dL (ref 0.40–1.20)
GFR: 107.62 mL/min (ref >60.00)
Glucose, Bld: 82 mg/dL (ref 70–99)
Potassium: 4 mEq/L (ref 3.5–5.1)
Sodium: 141 mEq/L (ref 135–145)
Total Bilirubin: 0.3 mg/dL (ref 0.2–1.2)
Total Protein: 6.3 g/dL (ref 6.0–8.3)

## 2021-12-24 LAB — CBC WITH DIFFERENTIAL/PLATELET
Basophils Absolute: 0.1 10*3/uL (ref 0.0–0.1)
Basophils Relative: 0.7 % (ref 0.0–3.0)
Eosinophils Absolute: 0.2 10*3/uL (ref 0.0–0.7)
Eosinophils Relative: 2.2 % (ref 0.0–5.0)
HCT: 35.1 % — ABNORMAL LOW (ref 36.0–46.0)
Hemoglobin: 11.8 g/dL — ABNORMAL LOW (ref 12.0–15.0)
Lymphocytes Relative: 39.2 % (ref 12.0–46.0)
Lymphs Abs: 3.2 10*3/uL (ref 0.7–4.0)
MCHC: 33.7 g/dL (ref 30.0–36.0)
MCV: 87.4 fl (ref 78.0–100.0)
Monocytes Absolute: 0.5 10*3/uL (ref 0.1–1.0)
Monocytes Relative: 5.9 % (ref 3.0–12.0)
Neutro Abs: 4.3 10*3/uL (ref 1.4–7.7)
Neutrophils Relative %: 52 % (ref 43.0–77.0)
Platelets: 190 10*3/uL (ref 150.0–400.0)
RBC: 4.02 Mil/uL (ref 3.87–5.11)
RDW: 13 % (ref 11.5–15.5)
WBC: 8.2 10*3/uL (ref 4.0–10.5)

## 2021-12-25 ENCOUNTER — Encounter: Payer: Self-pay | Admitting: Physician Assistant

## 2022-01-02 HISTORY — PX: OTHER SURGICAL HISTORY: SHX169

## 2022-01-04 ENCOUNTER — Ambulatory Visit
Admission: RE | Admit: 2022-01-04 | Discharge: 2022-01-04 | Disposition: A | Discharge status: Home / Self Care | Payer: Commercial Managed Care - PPO | Source: Ambulatory Visit | Attending: Obstetrics and Gynecology | Admitting: Obstetrics and Gynecology

## 2022-01-04 DIAGNOSIS — N6310 Unspecified lump in the right breast, unspecified quadrant: Secondary | ICD-10-CM

## 2022-01-04 DIAGNOSIS — N6320 Unspecified lump in the left breast, unspecified quadrant: Secondary | ICD-10-CM

## 2022-01-04 DIAGNOSIS — N6315 Unspecified lump in the right breast, overlapping quadrants: Secondary | ICD-10-CM

## 2022-01-05 ENCOUNTER — Telehealth: Payer: Self-pay

## 2022-01-05 NOTE — Telephone Encounter (Signed)
In regards to result note and our conversation.  I checked with Dr. Berline Lopes and following high risk breast cancer patient's is not something she is comfortable doing.  I spoke with patient and offered to scheduled her with Dr. Lindi Adie but she declined stating she prefers a female MD.  She already has a follow up appointment scheduled with Dr. Chryl Heck for 04/14/22 and decided she will just keep that appointment and see how it goes.  ?

## 2022-01-05 NOTE — Telephone Encounter (Signed)
-----   Message from Nunzio Cobbs, MD sent at 01/04/2022  2:07 PM EDT ----- ?Please contact patient in follow up to her diagnostic breast imaging - mammogram and ultrasound.  ?No abnormalities were noted.  ?Ok to remove from mammogram hold.  ? ?It would be beneficial to have her seen an oncologist of choice for a breast recheck.  ? ? ? ? ?

## 2022-01-11 ENCOUNTER — Ambulatory Visit (HOSPITAL_COMMUNITY)
Admission: RE | Admit: 2022-01-11 | Discharge: 2022-01-11 | Disposition: A | Discharge status: Home / Self Care | Payer: Commercial Managed Care - PPO | Source: Ambulatory Visit | Attending: Gynecologic Oncology | Admitting: Gynecologic Oncology

## 2022-01-11 ENCOUNTER — Other Ambulatory Visit: Payer: Self-pay

## 2022-01-11 ENCOUNTER — Inpatient Hospital Stay: Payer: Commercial Managed Care - PPO | Attending: Hematology and Oncology

## 2022-01-11 DIAGNOSIS — D3912 Neoplasm of uncertain behavior of left ovary: Secondary | ICD-10-CM | POA: Insufficient documentation

## 2022-01-11 DIAGNOSIS — K921 Melena: Secondary | ICD-10-CM | POA: Insufficient documentation

## 2022-01-13 LAB — INHIBIN B: Inhibin B: 56 pg/mL

## 2022-01-16 LAB — ANTI MULLERIAN HORMONE: ANTI-MULLERIAN HORMONE (AMH): 1.7 ng/mL

## 2022-01-18 ENCOUNTER — Other Ambulatory Visit: Payer: Self-pay

## 2022-01-18 ENCOUNTER — Encounter: Payer: Self-pay | Admitting: Gynecologic Oncology

## 2022-01-18 ENCOUNTER — Inpatient Hospital Stay (HOSPITAL_BASED_OUTPATIENT_CLINIC_OR_DEPARTMENT_OTHER): Payer: Commercial Managed Care - PPO | Admitting: Gynecologic Oncology

## 2022-01-18 ENCOUNTER — Ambulatory Visit: Payer: Commercial Managed Care - PPO

## 2022-01-18 VITALS — BP 120/68 | HR 71 | Temp 99.2°F | Resp 16 | Ht 67.01 in | Wt 129.6 lb

## 2022-01-18 DIAGNOSIS — K625 Hemorrhage of anus and rectum: Secondary | ICD-10-CM

## 2022-01-18 DIAGNOSIS — K921 Melena: Secondary | ICD-10-CM | POA: Diagnosis not present

## 2022-01-18 DIAGNOSIS — D3912 Neoplasm of uncertain behavior of left ovary: Secondary | ICD-10-CM | POA: Diagnosis not present

## 2022-01-18 NOTE — Progress Notes (Signed)
Gynecologic Oncology Return Clinic Visit ? ?01/18/2022 ? ?Reason for Visit: surveillance in the setting of  granulosa cell tumor ? ?Treatment History: ?Oncology History  ?Granulosa cell tumor of left ovary  ?10/18/2019 Imaging  ? Pelvic ultrasound: 11 x 7 x 9 cm left ovarian cyst, avascular with thin septation.  No free fluid.  Right ovary normal in appearance. ?  ?11/13/2019 Surgery  ? Laparoscopic left oophorectomy with collection of pelvic washings, lysis of adhesion.  Findings at the time of surgery were a 12 cm left ovarian cyst.  No ascites or intra-abdominal/pelvic disease.  Ovary was removed in a contained fashion and in an Endo Catch bag. ?  ?11/13/2019 Pathology Results  ? Left ovary showing granulosa cell tumor, 2.1 cm.  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 ?Pelvic washings negative for malignant cells. ?  ?11/13/2019 Initial Diagnosis  ? Granulosa cell tumor of left ovary ?  ?11/29/2019 Imaging  ? CT A/P: ?No acute findings in the abdomen or pelvis.  Specifically, no evidence for metastatic disease in the abdomen or pelvis. ?Trace free fluid in the cul-de-sac.  This can be physiologic in a premenopausal female. ?  ?12/27/2019 Tumor Marker  ? Patient's blood was tested for the following markers: Inh B, AMH ?Results of the tumor marker test revealed: 83, 1.07. ?  ? ? ?Interval History: ?Kathryn Park is scheduled for hip surgery later this week. ? ?She has had 2 episodes of bright red blood with her stool.  Also noted a period of time where her stool was darker than normal.  Is not taking iron or other medications that cause darkening of the stool.  Had to limited episodes of bright red bleeding prior to her granulosa cell tumor diagnosis.  Otherwise has never had any bright red bleeding per rectum.  She also notes that she has had some intermittent looser stools. ? ?Within the last month or so, has had some intermittent bloating as well as dyspepsia and reflux symptoms.  This is new  for her.  Has not used any medication. ? ?Appetite is still decreased, has continued to lose a little bit of weight. ? ?Started Lexapro, which is significantly helping with her mood changes around her menses. ? ?Past Medical/Surgical History: ?Past Medical History:  ?Diagnosis Date  ? Abnormal Pap smear of cervix   ? Allergy   ? Anemia   ? suspected poor diet; vegetarian during teens  ? Anxiety   ? Asthma   ? childhood  ? Cancer [facility])   ? Depression   ? Dysmenorrhea   ? Family history of breast cancer   ? Family history of kidney cancer   ? History of iron deficiency anemia   ? age 36  ? Joint pain   ? Labral tear of right hip joint   ? Leukocytosis   ? Low vitamin D level   ? Migraine with aura   ? never on rx or saw neurology  ? Ovarian cyst   ? Left  ? Pneumonia 01/2012  ?  left lower lobe  ? Pre-diabetes 2015  ? had a "borderline" A1c  ? STD (sexually transmitted disease)   ? HSV1  ? Wears contact lenses   ? Wears glasses   ? ? ?Past Surgical History:  ?Procedure Laterality Date  ? BREAST BIOPSY Right 04/2020  ? benign  ? COLPOSCOPY    ? LAPAROSCOPIC OVARIAN CYSTECTOMY Left 11/13/2019  ? Procedure: LAPAROSCOPIC OVARIAN CYSTECTOMY/POSSIBLE LEFT OOPHORECTOMY/ COLLECTION OF PELVIC WASHINGS/ks;  Surgeon: Nunzio Cobbs, MD;  Location: [facility];  Service: Gynecology;  Laterality: Left;  Possible left oophorectomy ?Colection of pelvic washings ?To follow first case of the day  ? LEFT OOPHORECTOMY Left 11/2019  ? TONSILLECTOMY    ? UPPER GI ENDOSCOPY    ? ? ?Family History  ?Problem Relation Age of Onset  ? Diabetes Mother   ? Hypertension Mother   ? Stroke Mother   ?     TIA  ? Factor V Leiden deficiency Mother   ? Other Mother   ?     pituitary tumor  ? Crohn's disease Father   ? Kidney cancer Father   ?     cancerous tumor of kidney  ? Liver disease Father   ? Breast cancer Maternal Grandmother   ?     dx. 42s  ? Stroke Maternal Grandfather   ? Other Brother   ?     ear tumor, dx. age 1-6   ? Rheum arthritis Maternal Great-grandmother   ? Cancer Other   ? Ovarian cancer Neg Hx   ? Endometrial cancer Neg Hx   ? Colon cancer Neg Hx   ? ? ?Social History  ? ?Socioeconomic History  ? Marital status: Married  ?  Spouse name: Not on file  ? Number of children: Not on file  ? Years of education: Not on file  ? Highest education level: Not on file  ?Occupational History  ? Not on file  ?Tobacco Use  ? Smoking status: Former  ?  Packs/day: 1.00  ?  Types: Cigarettes  ? Smokeless tobacco: Never  ?Vaping Use  ? Vaping Use: Never used  ?Substance and Sexual Activity  ? Alcohol use: Not Currently  ? Drug use: No  ? Sexual activity: Yes  ?  Partners: Male  ?  Birth control/protection: Condom, Other-see comments  ?  Comment: vasectomy  ?Other Topics Concern  ? Not on file  ?Social History Narrative  ? Moved from [place]  ? Into [employer]  ? Married  ? ?Social Determinants of Health  ? ?Financial Resource Strain: Not on file  ?Food Insecurity: Not on file  ?Transportation Needs: Not on file  ?Physical Activity: Not on file  ?Stress: Not on file  ?Social Connections: Not on file  ? ? ?Current Medications: ? ?Current Outpatient Medications:  ?  busPIRone (BUSPAR) 5 MG tablet, Take 1 tablet (5 mg total) by mouth 3 (three) times daily as needed (anxiety)., Disp: 90 tablet, Rfl: 1 ?  cyanocobalamin 1000 MCG tablet, Take 1,000 mcg by mouth daily., Disp: , Rfl:  ?  escitalopram (LEXAPRO) 10 MG tablet, Take 1 tablet (10 mg total) by mouth daily. Take daily for 2 weeks leading up to menstrual cycle., Disp: 45 tablet, Rfl: 3 ?  Fexofenadine-Pseudoephedrine (ALLEGRA-D 24 HOUR PO), Take 1 tablet by mouth as needed., Disp: , Rfl:  ?  Multiple Vitamins-Minerals (MULTIPLE VITAMINS/WOMENS PO), Take 1 tablet by mouth daily in the afternoon., Disp: , Rfl:  ?  valACYclovir (VALTREX) 1000 MG tablet, Take 2 tablets (2000 mg) by mouth twice a day for 24 hours as needed for cold sore., Disp: 30 tablet, Rfl: 1 ? ?Review of  Systems: ?Pertinent positives include bloating, blood in stool, urinary frequency, pelvic pain, joint pain, numbness. ?Denies appetite changes, fevers, chills, fatigue, unexplained weight changes. ?Denies hearing loss, neck lumps or masses, mouth sores, ringing in ears or voice changes. ?Denies cough or wheezing.  Denies shortness of breath. ?Denies chest pain or palpitations. Denies leg swelling. ?Denies abdominal pain, constipation, nausea, vomiting, or early satiety. ?Denies pain with intercourse, dysuria, hematuria or incontinence. ?Denies hot flashes, vaginal bleeding or vaginal discharge.   ?Denies back pain or muscle pain/cramps. ?Denies itching, rash, or wounds. ?Denies dizziness, headaches,  or seizures. ?Denies swollen lymph nodes or glands, denies easy bruising or bleeding. ?Denies anxiety, depression, confusion, or decreased concentration. ? ?Physical Exam: ?BP 120/68 (BP Location: Right Arm, Patient Position: Sitting)   Pulse 71   Temp 99.2 ?F (37.3 ?C) (Oral)   Resp 16   Ht 5' 7.01" (1.702 m)   Wt 129 lb 9.6 oz (58.8 kg)   SpO2 100%   BMI 20.29 kg/m?  ?General: Alert, oriented, no acute distress. ?HEENT: Normocephalic, atraumatic, sclera anicteric. ?Chest: Clear to auscultation bilaterally.  No wheezes or rhonchi. ?Cardiovascular: Regular rate and rhythm, no murmurs. ?Abdomen: soft, nontender.  Normoactive bowel sounds.  No masses or hepatosplenomegaly appreciated.  Well-healed incisions. ?Extremities: Grossly normal range of motion.  Warm, well perfused.  No edema bilaterally. ?Skin: No rashes or lesions noted. ?Lymphatics: No cervical, supraclavicular, or inguinal adenopathy. ?GU: Normal appearing external genitalia without erythema, excoriation, or lesions.  Speculum exam reveals well rugated vaginal mucosa, no lesions or masses.  Cervix is normal in appearance.  Bimanual exam reveals small mobile uterus, no adnexal masses appreciated, no nodularity.  Rectovaginal exam confirms these  findings.  No hemorrhoids noted externally.  No rectal masses or internal hemorrhoids palpated. ? ?Laboratory & Radiologic Studies: ?Component Ref Range & Units 7 d ago ?(01/11/22) 5 mo ago ?(08/10/21) 11 mo ago ?(5/13

## 2022-01-18 NOTE — Patient Instructions (Addendum)
It was good to see you today.  ? ?I have ordered a CT scan.  If we cannot get this done before your hip surgery, we will plan to do it about a month after.  If the ovary looks normal, we will not proceed with a repeat ultrasound.  If ovary does not look normal or the CT is done this week, then we will plan to schedule a pelvic ultrasound in approximately 3 months to reassess that ovary. ? ?I have put in a referral for GI given your GI symptoms as well as bright red bleeding. ? ?Otherwise, I will see you back in 6 months.  My schedule is not out that far.  Please call back sometime in August or September to schedule a visit with me in mid October.  We will plan to get blood work and an ultrasound at that visit again. ? ?As always, if you develop any new and concerning symptoms before your next visit, please call to let me know. ?

## 2022-01-20 ENCOUNTER — Other Ambulatory Visit: Payer: Self-pay

## 2022-02-01 ENCOUNTER — Encounter: Payer: Self-pay | Admitting: Internal Medicine

## 2022-02-08 ENCOUNTER — Ambulatory Visit: Payer: Commercial Managed Care - PPO | Admitting: Gynecologic Oncology

## 2022-02-12 ENCOUNTER — Other Ambulatory Visit: Payer: Self-pay

## 2022-02-12 NOTE — Telephone Encounter (Signed)
Rx was originally to sent to local pharmacy at appt 3/23. Refill request came in for optum rx. Please approve if appropriate ? ?Medication: Escitalopram '10mg'$  ?

## 2022-02-15 ENCOUNTER — Ambulatory Visit (HOSPITAL_COMMUNITY)
Admission: RE | Admit: 2022-02-15 | Discharge: 2022-02-15 | Disposition: A | Discharge status: Home / Self Care | Payer: Commercial Managed Care - PPO | Source: Ambulatory Visit | Attending: Gynecologic Oncology | Admitting: Gynecologic Oncology

## 2022-02-15 DIAGNOSIS — D3912 Neoplasm of uncertain behavior of left ovary: Secondary | ICD-10-CM | POA: Diagnosis present

## 2022-02-15 MED ORDER — IOHEXOL 300 MG/ML  SOLN
100.0000 mL | Freq: Once | INTRAMUSCULAR | Status: AC | PRN
  Administered 2022-02-15: 80 mL via INTRAVENOUS

## 2022-02-15 MED ORDER — ESCITALOPRAM OXALATE 10 MG PO TABS: 10.0000 mg | ORAL_TABLET | Freq: Every day | ORAL | 2 refills | Status: DC

## 2022-02-15 MED ORDER — SODIUM CHLORIDE (PF) 0.9 % IJ SOLN
INTRAMUSCULAR | Status: AC
  Filled 2022-02-15: qty 50

## 2022-02-18 ENCOUNTER — Telehealth: Payer: Self-pay | Admitting: Gynecologic Oncology

## 2022-02-18 DIAGNOSIS — D3912 Neoplasm of uncertain behavior of left ovary: Secondary | ICD-10-CM

## 2022-02-18 DIAGNOSIS — R1900 Intra-abdominal and pelvic swelling, mass and lump, unspecified site: Secondary | ICD-10-CM

## 2022-02-18 NOTE — Telephone Encounter (Addendum)
Called to touch base with the patient about recent CT scan results. We will plan to proceed with the recommended MRI of the pelvis to further evaluate the structure seen along the left posterior uterine margin. She is currently recovering at home from having a recent hip scope and afternoons for the scan are better. The only metal she has in her body would be a breast clip/marker. Patient will be contacted with appt date and time. No questions or concerns voiced.

## 2022-02-19 ENCOUNTER — Encounter: Payer: Self-pay | Admitting: *Deleted

## 2022-02-23 ENCOUNTER — Encounter: Payer: Self-pay | Admitting: Internal Medicine

## 2022-02-23 ENCOUNTER — Ambulatory Visit (INDEPENDENT_AMBULATORY_CARE_PROVIDER_SITE_OTHER): Payer: Commercial Managed Care - PPO | Admitting: Internal Medicine

## 2022-02-23 ENCOUNTER — Other Ambulatory Visit (INDEPENDENT_AMBULATORY_CARE_PROVIDER_SITE_OTHER): Payer: Commercial Managed Care - PPO

## 2022-02-23 VITALS — BP 130/70 | HR 80 | Ht 66.0 in | Wt 135.0 lb

## 2022-02-23 DIAGNOSIS — K921 Melena: Secondary | ICD-10-CM

## 2022-02-23 DIAGNOSIS — K625 Hemorrhage of anus and rectum: Secondary | ICD-10-CM

## 2022-02-23 DIAGNOSIS — K219 Gastro-esophageal reflux disease without esophagitis: Secondary | ICD-10-CM

## 2022-02-23 DIAGNOSIS — D649 Anemia, unspecified: Secondary | ICD-10-CM | POA: Diagnosis not present

## 2022-02-23 LAB — CBC WITH DIFFERENTIAL/PLATELET
Basophils Absolute: 0.1 10*3/uL (ref 0.0–0.1)
Basophils Relative: 0.7 % (ref 0.0–3.0)
Eosinophils Absolute: 0.2 10*3/uL (ref 0.0–0.7)
Eosinophils Relative: 2.4 % (ref 0.0–5.0)
HCT: 39.3 % (ref 36.0–46.0)
Hemoglobin: 13.3 g/dL (ref 12.0–15.0)
Lymphocytes Relative: 33.9 % (ref 12.0–46.0)
Lymphs Abs: 3.2 10*3/uL (ref 0.7–4.0)
MCHC: 33.7 g/dL (ref 30.0–36.0)
MCV: 86.5 fl (ref 78.0–100.0)
Monocytes Absolute: 0.7 10*3/uL (ref 0.1–1.0)
Monocytes Relative: 7.9 % (ref 3.0–12.0)
Neutro Abs: 5.2 10*3/uL (ref 1.4–7.7)
Neutrophils Relative %: 55.1 % (ref 43.0–77.0)
Platelets: 203 10*3/uL (ref 150.0–400.0)
RBC: 4.55 Mil/uL (ref 3.87–5.11)
RDW: 13.1 % (ref 11.5–15.5)
WBC: 9.4 10*3/uL (ref 4.0–10.5)

## 2022-02-23 LAB — IBC + FERRITIN
Ferritin: 19.7 ng/mL (ref 10.0–291.0)
Iron: 84 ug/dL (ref 42–145)
Saturation Ratios: 22.4 % (ref 20.0–50.0)
TIBC: 375.2 ug/dL (ref 250.0–450.0)
Transferrin: 268 mg/dL (ref 212.0–360.0)

## 2022-02-23 MED ORDER — PLENVU 140 G PO SOLR: 1.0000 | ORAL | 0 refills | Status: DC

## 2022-02-23 NOTE — Progress Notes (Signed)
Chief Complaint: Rectal bleeding  HPI : 36 year old female with history of anemia, migraine, ovarian cancer s/p left oophorectomy presents with rectal bleeding.  She follows with Dr. Berline Lopes for her ovarian cancer care.  She first noticed dark tarry stools that started 2 months ago, followed bright red blood in the rectum. The BRBPR has been intermittent over the last few months. She had an increased in heartburn and belching. Endorses generalized unease in her abdomen, loss of appetite, and nausea. She has had some generalized ab discomfort. Denies dysphagia, vomiting, odynophagia. Over the last month she does feel like her GERD has been helped by taking Pepcid, which was started after her hip surgery. She had her hip surgery for a torn labrum. She typically has regular BMs, but over the last few months she has been more constipated. Other times food will go straight through her. Denies prior colonoscopy. Last EGD was in 2008 and was normal. Denies fam hx of GI cancers. Endorses fam hx of colon polyps and ulcerative colitis in her father. Denies blood thinners. Denies use of NSAIDs.  Wt Readings from Last 3 Encounters:  02/23/22 135 lb (61.2 kg)  01/18/22 129 lb 9.6 oz (58.8 kg)  12/23/21 133 lb 3.2 oz (60.4 kg)   Past Medical History:  Diagnosis Date   Abnormal Pap smear of cervix    Allergy    Anemia    suspected poor diet; vegetarian during teens   Anxiety    Asthma    childhood   Depression    Dysmenorrhea    Family history of breast cancer    Family history of kidney cancer    Granulosa cell tumor of ovary    History of iron deficiency anemia    age 73   Joint pain    Labral tear of right hip joint    Leukocytosis    Low vitamin D level    Migraine with aura    never on rx or saw neurology   Ovarian cancer ([place]) 2021   Ovarian cyst    Left   Pneumonia 01/2012    left lower lobe   Pre-diabetes 2015   had a "borderline" A1c   STD (sexually transmitted disease)    HSV1    Wears contact lenses    Wears glasses      Past Surgical History:  Procedure Laterality Date   BREAST BIOPSY Right 04/2020   benign   COLPOSCOPY     LAPAROSCOPIC OVARIAN CYSTECTOMY Left 11/13/2019   Procedure: LAPAROSCOPIC OVARIAN CYSTECTOMY/POSSIBLE LEFT OOPHORECTOMY/ COLLECTION OF PELVIC WASHINGS/ks;  Surgeon: Nunzio Cobbs, MD;  Location[facility];  Service: Gynecology;  Laterality: Left;  Possible left oophorectomy Colection of pelvic washings To follow first case of the day   LEFT OOPHORECTOMY Left 11/2019   right hip arthroscopy  01/2022   for laberal tear repair   TONSILLECTOMY     UPPER GI ENDOSCOPY     Family History  Problem Relation Age of Onset   Diabetes Mother    Hypertension Mother    Stroke Mother        TIA   Factor V Leiden deficiency Mother    Other Mother        pituitary tumor   Colon polyps Mother    Diverticulitis Mother    Kidney cancer Father        cancerous tumor of kidney   Ulcerative colitis Father    Other Brother  ear tumor, dx. age 86-6   Breast cancer Maternal Grandmother        dx. 80s   Stroke Maternal Grandfather    Rheum arthritis Maternal Great-grandmother    Cancer Other    Ovarian cancer Neg Hx    Endometrial cancer Neg Hx    Colon cancer Neg Hx    Social History   Tobacco Use   Smoking status: Former    Packs/day: 1.00    Types: Cigarettes   Smokeless tobacco: Never  Vaping Use   Vaping Use: Never used  Substance Use Topics   Alcohol use: Not Currently   Drug use: No   Current Outpatient Medications  Medication Sig Dispense Refill   busPIRone (BUSPAR) 5 MG tablet Take 1 tablet (5 mg total) by mouth 3 (three) times daily as needed (anxiety). 90 tablet 1   cyanocobalamin 1000 MCG tablet Take 1,000 mcg by mouth daily.     escitalopram (LEXAPRO) 10 MG tablet Take 1 tablet (10 mg total) by mouth daily. Take daily for 2 weeks leading up to menstrual cycle. 45 tablet 2    Fexofenadine-Pseudoephedrine (ALLEGRA-D 24 HOUR PO) Take 1 tablet by mouth as needed.     Multiple Vitamins-Minerals (MULTIPLE VITAMINS/WOMENS PO) Take 1 tablet by mouth daily in the afternoon.     PEG-KCl-NaCl-NaSulf-Na Asc-C (PLENVU) 140 g SOLR Take 1 kit by mouth as directed. Use coupon: BIN: [id number] PNC: CNRX Group: [id number] ID: [id number] 1 each 0   valACYclovir (VALTREX) 1000 MG tablet Take 2 tablets (2000 mg) by mouth twice a day for 24 hours as needed for cold sore. 30 tablet 1   No current facility-administered medications for this visit.   Allergies  Allergen Reactions   Shellfish Allergy Anaphylaxis    Throat closes   Other Other (See Comments)    Watermelon, mold/mildew, pet dander, dust and dust mites  Swelling of eyes and congestion   Latex Rash   Sulfa Antibiotics Rash     Review of Systems: All systems reviewed and negative except where noted in HPI.   Physical Exam: BP 130/70   Pulse 80   Ht _0  (1.676 m)   Wt 135 lb (61.2 kg)   BMI 21.79 kg/m  Constitutional: Pleasant,well-developed, female in no acute distress. HEENT: Normocephalic and atraumatic. Conjunctivae are normal. No scleral icterus. Cardiovascular: Normal rate, regular rhythm.  Pulmonary/chest: Effort normal and breath sounds normal. No wheezing, rales or rhonchi. Abdominal: Soft, nondistended, nontender. Bowel sounds active throughout. There are no masses palpable. No hepatomegaly. Extremities: Right hip is in a brace Neurological: Alert and oriented to person place and time. Skin: Skin is warm and dry. No rashes noted. Psychiatric: Normal mood and affect. Behavior is normal.  Labs 12/2021: CBC with low Hb of 11.8. CMP unremarkable.  CT A/P w/contrast 02/17/22: IMPRESSION: 1. Along the left posterior uterine margin, a 2.4 cc mixed density structure with high density component is observed. Questionably present a lower in density on 11/29/2019. Possibilities include scarring, ovarian  remnant, small serosal fibroid, or less likely recurrent tumor. If clinically warranted, complimentary assessment with pelvic MRI with and without contrast might be considered. 2. Although the aortomesenteric distance is reduced at 5 mm, the aortomesenteric angle is within normal limits 35 degrees and accordingly superior mesenteric artery syndrome is considered less likely.  ASSESSMENT AND PLAN: Melena Rectal bleeding GERD Anemia Patient presents with melena and rectal bleeding that started 2 months ago.  She has also been experiencing some abdominal discomfort, loss  of appetite, and nausea over that period of time.  Has been using Pepcid, which has been helping with some of her acid reflux symptoms.  Patient has been recently noted to be anemic.  Will plan to recheck her blood counts and iron levels today. I will plan for EGD and colonoscopy for further evaluation of her melena and hematochezia.  Potential sources of bleeding could include esophagitis, PUD, hemorrhoids, IBD, and/or malignancy.  Patient has a history of ovarian cancer has been treated with surgical resection.  Her recent CT did show a mixed density structure along the left posterior uterine margin.  She has an upcoming MRI for further evaluation.   - GERD handout - Cont Pepcid PRN - Check CBC, ferritin/IBC, CRP - EGD/colonoscopy [facility]  Christia Reading, MD

## 2022-02-23 NOTE — Patient Instructions (Addendum)
You have been scheduled for an endoscopy and colonoscopy. Please follow the written instructions given to you at your visit today. Please pick up your prep supplies at the pharmacy within the next 1-3 days. If you use inhalers (even only as needed), please bring them with you on the day of your procedure.  Your provider has requested that you go to the basement level for lab work before leaving today. Press "B" on the elevator. The lab is located at the first door on the left as you exit the elevator.  Please look over and follow the GERD handout we have given you today.  If you are age 17 or older, your body mass index should be between 23-30. Your Body mass index is 21.79 kg/m. If this is out of the aforementioned range listed, please consider follow up with your Primary Care Provider.  If you are age 24 or younger, your body mass index should be between 19-25. Your Body mass index is 21.79 kg/m. If this is out of the aformentioned range listed, please consider follow up with your Primary Care Provider.   ________________________________________________________  The [facility] providers would like to encourage you to use [facility] to communicate with providers for non-urgent requests or questions.  Due to long hold times on the telephone, sending your provider a message by [facility] may be a faster and more efficient way to get a response.  Please allow 48 business hours for a response.  Please remember that this is for non-urgent requests.  _______________________________________________________  Due to recent changes in healthcare laws, you may see the results of your imaging and laboratory studies on MyChart before your provider has had a chance to review them.  We understand that in some cases there may be results that are confusing or concerning to you. Not all laboratory results come back in the same time frame and the provider may be waiting for multiple results in order to interpret others.   Please give [place] 48 hours in order for your provider to thoroughly review all the results before contacting the office for clarification of your results.   ____________________________________________

## 2022-02-24 ENCOUNTER — Encounter: Payer: Self-pay | Admitting: Internal Medicine

## 2022-02-25 LAB — HIGH SENSITIVITY CRP: CRP, High Sensitivity: 0.33 mg/L (ref 0.000–5.000)

## 2022-02-26 ENCOUNTER — Ambulatory Visit (AMBULATORY_SURGERY_CENTER): Payer: Commercial Managed Care - PPO | Admitting: Internal Medicine

## 2022-02-26 ENCOUNTER — Encounter: Payer: Self-pay | Admitting: Internal Medicine

## 2022-02-26 VITALS — BP 114/72 | HR 49 | Temp 97.8°F | Resp 16 | Ht 66.0 in | Wt 135.0 lb

## 2022-02-26 DIAGNOSIS — K319 Disease of stomach and duodenum, unspecified: Secondary | ICD-10-CM | POA: Diagnosis not present

## 2022-02-26 DIAGNOSIS — K2980 Duodenitis without bleeding: Secondary | ICD-10-CM

## 2022-02-26 DIAGNOSIS — K31A0 Gastric intestinal metaplasia, unspecified: Secondary | ICD-10-CM | POA: Diagnosis not present

## 2022-02-26 DIAGNOSIS — K635 Polyp of colon: Secondary | ICD-10-CM | POA: Diagnosis not present

## 2022-02-26 DIAGNOSIS — K259 Gastric ulcer, unspecified as acute or chronic, without hemorrhage or perforation: Secondary | ICD-10-CM

## 2022-02-26 DIAGNOSIS — K625 Hemorrhage of anus and rectum: Secondary | ICD-10-CM | POA: Diagnosis not present

## 2022-02-26 DIAGNOSIS — D123 Benign neoplasm of transverse colon: Secondary | ICD-10-CM

## 2022-02-26 DIAGNOSIS — D122 Benign neoplasm of ascending colon: Secondary | ICD-10-CM

## 2022-02-26 DIAGNOSIS — K2970 Gastritis, unspecified, without bleeding: Secondary | ICD-10-CM

## 2022-02-26 DIAGNOSIS — K921 Melena: Secondary | ICD-10-CM

## 2022-02-26 DIAGNOSIS — K269 Duodenal ulcer, unspecified as acute or chronic, without hemorrhage or perforation: Secondary | ICD-10-CM

## 2022-02-26 DIAGNOSIS — R12 Heartburn: Secondary | ICD-10-CM | POA: Diagnosis not present

## 2022-02-26 DIAGNOSIS — K219 Gastro-esophageal reflux disease without esophagitis: Secondary | ICD-10-CM

## 2022-02-26 MED ORDER — OMEPRAZOLE 40 MG PO CPDR: 40.0000 mg | DELAYED_RELEASE_CAPSULE | Freq: Two times a day (BID) | ORAL | 2 refills | Status: DC

## 2022-02-26 MED ORDER — HYDROCORTISONE (PERIANAL) 2.5 % EX CREA: 1.0000 "application " | TOPICAL_CREAM | Freq: Two times a day (BID) | CUTANEOUS | 1 refills | Status: DC

## 2022-02-26 MED ORDER — SODIUM CHLORIDE 0.9 % IV SOLN: 500.0000 mL | Freq: Once | INTRAVENOUS | Status: DC

## 2022-02-26 NOTE — Progress Notes (Signed)
GASTROENTEROLOGY PROCEDURE H&P NOTE   Primary Care Physician: [name], PA    Reason for Procedure:   Melena, rectal bleeding  Plan:    EGD/colonoscopy  Patient is appropriate for endoscopic procedure(s) in the ambulatory ([facility]) setting.  The nature of the procedure, as well as the risks, benefits, and alternatives were carefully and thoroughly reviewed with the patient. Ample time for discussion and questions allowed. The patient understood, was satisfied, and agreed to proceed.     HPI: Kathryn Park is a 36 y.o. female who presents for EGD/colonoscopy for evaluation of melena and rectal bleeding .  Patient was most recently seen in the Gastroenterology Clinic on 02/23/22.  No interval change in medical history since that appointment. Please refer to that note for full details regarding GI history and clinical presentation.   Past Medical History:  Diagnosis Date   Abnormal Pap smear of cervix    Allergy    Anemia    suspected poor diet; vegetarian during teens   Anxiety    Asthma    childhood   Depression    Dysmenorrhea    Family history of breast cancer    Family history of kidney cancer    Granulosa cell tumor of ovary    History of iron deficiency anemia    age 21   Joint pain    Labral tear of right hip joint    Leukocytosis    Low vitamin D level    Migraine with aura    never on rx or saw neurology   Ovarian cancer ([place]) 2021   Ovarian cyst    Left   Pneumonia 01/2012    left lower lobe   Pre-diabetes 2015   had a "borderline" A1c   STD (sexually transmitted disease)    HSV1   Wears contact lenses    Wears glasses     Past Surgical History:  Procedure Laterality Date   BREAST BIOPSY Right 04/2020   benign   COLPOSCOPY     LAPAROSCOPIC OVARIAN CYSTECTOMY Left 11/13/2019   Procedure: LAPAROSCOPIC OVARIAN CYSTECTOMY/POSSIBLE LEFT OOPHORECTOMY/ COLLECTION OF PELVIC WASHINGS/ks;  Surgeon: Nunzio Cobbs, MD;   Location: [facility];  Service: Gynecology;  Laterality: Left;  Possible left oophorectomy Colection of pelvic washings To follow first case of the day   LEFT OOPHORECTOMY Left 11/2019   right hip arthroscopy  01/2022   for laberal tear repair   TONSILLECTOMY     UPPER GI ENDOSCOPY      Prior to Admission medications   Medication Sig Start Date End Date Taking? Authorizing Provider  busPIRone (BUSPAR) 5 MG tablet Take 1 tablet (5 mg total) by mouth 3 (three) times daily as needed (anxiety). 11/05/20  Yes [name], PA  cyanocobalamin 1000 MCG tablet Take 1,000 mcg by mouth daily.   Yes [provider]  escitalopram (LEXAPRO) 10 MG tablet Take 1 tablet (10 mg total) by mouth daily. Take daily for 2 weeks leading up to menstrual cycle. 02/15/22  Yes Nunzio Cobbs, MD  Multiple Vitamins-Minerals (MULTIPLE VITAMINS/WOMENS PO) Take 1 tablet by mouth daily in the afternoon.   Yes [provider]  Fexofenadine-Pseudoephedrine (ALLEGRA-D 24 HOUR PO) Take 1 tablet by mouth as needed. 01/21/20   [provider]  valACYclovir (VALTREX) 1000 MG tablet Take 2 tablets (2000 mg) by mouth twice a day for 24 hours as needed for cold sore. 11/05/21   Yisroel Ramming, Brook  E, MD    Current Outpatient Medications  Medication Sig Dispense Refill   busPIRone (BUSPAR) 5 MG tablet Take 1 tablet (5 mg total) by mouth 3 (three) times daily as needed (anxiety). 90 tablet 1   cyanocobalamin 1000 MCG tablet Take 1,000 mcg by mouth daily.     escitalopram (LEXAPRO) 10 MG tablet Take 1 tablet (10 mg total) by mouth daily. Take daily for 2 weeks leading up to menstrual cycle. 45 tablet 2   Multiple Vitamins-Minerals (MULTIPLE VITAMINS/WOMENS PO) Take 1 tablet by mouth daily in the afternoon.     Fexofenadine-Pseudoephedrine (ALLEGRA-D 24 HOUR PO) Take 1 tablet by mouth as needed.     valACYclovir (VALTREX) 1000 MG tablet Take 2 tablets (2000 mg) by mouth twice a  day for 24 hours as needed for cold sore. 30 tablet 1   Current Facility-Administered Medications  Medication Dose Route Frequency Provider Last Rate Last Admin   0.9 %  sodium chloride infusion  500 mL Intravenous Once Sharyn Creamer, MD        Allergies as of 02/26/2022 - Review Complete 02/26/2022  Allergen Reaction Noted   Shellfish allergy Anaphylaxis 11/07/2019   Other Other (See Comments) 10/13/2020   Latex Rash 01/05/2012   Sulfa antibiotics Rash 01/05/2012    Family History  Problem Relation Age of Onset   Diabetes Mother    Hypertension Mother    Stroke Mother        TIA   Factor V Leiden deficiency Mother    Other Mother        pituitary tumor   Colon polyps Mother    Diverticulitis Mother    Kidney cancer Father        cancerous tumor of kidney   Ulcerative colitis Father    Other Brother        ear tumor, dx. age 64-6   Breast cancer Maternal Grandmother        dx. 80s   Stroke Maternal Grandfather    Rheum arthritis Maternal Great-grandmother    Cancer Other    Ovarian cancer Neg Hx    Endometrial cancer Neg Hx    Colon cancer Neg Hx     Social History   Socioeconomic History   Marital status: Married    Spouse name: Not on file   Number of children: 0   Years of education: Not on file   Highest education level: Not on file  Occupational History    Comment: part time [employer]  Tobacco Use   Smoking status: Former    Packs/day: 1.00    Types: Cigarettes   Smokeless tobacco: Never  Vaping Use   Vaping Use: Never used  Substance and Sexual Activity   Alcohol use: Not Currently   Drug use: No   Sexual activity: Yes    Partners: Male    Birth control/protection: Other-see comments, Surgical    Comment: vasectomy  Other Topics Concern   Not on file  Social History Narrative   [place]   Married   Social Determinants of Health   Financial Resource Strain: Not on file  Food Insecurity: Not on  file  Transportation Needs: Not on file  Physical Activity: Not on file  Stress: Not on file  Social Connections: Not on file  Intimate Partner Violence: Not on file    Physical Exam: Vital signs in last 24 hours: BP 119/69   Pulse 81   Temp  97.8 F (36.6 C)   Ht '5\' 6"'$  (1.676 m)   Wt 135 lb (61.2 kg)   SpO2 97%   BMI 21.79 kg/m  GEN: NAD EYE: Sclerae anicteric ENT: MMM CV: Non-tachycardic Pulm: No increased WOB GI: Soft NEURO:  Alert & Oriented   Christia Reading, MD [facility]   02/26/2022 8:31 AM

## 2022-02-26 NOTE — Progress Notes (Signed)
Per Dr. Lorenso Courier- Anusol HC 2.5% cream, 30 gram, no refills- apply to rectum twice daily for 7 days

## 2022-02-26 NOTE — Op Note (Addendum)
[facility] Patient Name: Kathryn Park Procedure Date: 02/26/2022 9:07 AM MRN: [id number] Endoscopist: Sonny Masters "Kathryn Park ,  Age: 36 Referring MD:  Date of Birth: [birth year] Gender: Female Account #: [id number] Procedure:                Colonoscopy Indications:              Rectal bleeding Medicines:                Monitored Anesthesia Care Procedure:                Pre-Anesthesia Assessment:                           - Prior to the procedure, a History and Physical                            was performed, and patient medications and                            allergies were reviewed. The patient's tolerance of                            previous anesthesia was also reviewed. The risks                            and benefits of the procedure and the sedation                            options and risks were discussed with the patient.                            All questions were answered, and informed consent                            was obtained. Prior Anticoagulants: The patient has                            taken no previous anticoagulant or antiplatelet                            agents. ASA Grade Assessment: II - A patient with                            mild systemic disease. After reviewing the risks                            and benefits, the patient was deemed in                            satisfactory condition to undergo the procedure.                           After obtaining informed consent, the colonoscope  was passed under direct vision. Throughout the                            procedure, the patient's blood pressure, pulse, and                            oxygen saturations were monitored continuously. The                            Olympus [id number]-[id number] (#[id number]) Colonoscope was                            introduced through the anus and advanced to the the                            terminal ileum. The colonoscopy was  performed                            without difficulty. The patient tolerated the                            procedure well. The quality of the bowel                            preparation was excellent. The terminal ileum,                            ileocecal valve, appendiceal orifice, and rectum                            were photographed. Scope In: 9:22:46 AM Scope Out: 9:44:00 AM Scope Withdrawal Time: 0 hours 18 minutes 46 seconds  Total Procedure Duration: 0 hours 21 minutes 14 seconds  Findings:                 The terminal ileum appeared normal.                           Three sessile polyps were found in the transverse                            colon and ascending colon. The polyps were 3 to 12                            mm in size. These polyps were removed with a cold                            snare. Resection and retrieval were complete.                           Non-bleeding internal hemorrhoids were found during                            retroflexion. Complications:  No immediate complications. Estimated Blood Loss:     Estimated blood loss was minimal. Impression:               - The examined portion of the ileum was normal.                           - Three 3 to 12 mm polyps in the transverse colon                            and in the ascending colon, removed with a cold                            snare. Resected and retrieved.                           - Non-bleeding internal hemorrhoids. Recommendation:           - Discharge patient to home (with escort).                           - It is suspected that your dark tarry stools are                            due to stomach and small bowel erosions. The                            omeprazole should help that area heal. Your rectal                            bleeding is suspected to be due to hemorrhoids.                           - Await pathology results.                           - The findings and  recommendations were discussed                            with the patient. Sonny Masters "Kathryn Park,  02/26/2022 9:51:14 AM

## 2022-02-26 NOTE — Patient Instructions (Addendum)
Await pathology  Please read over handouts about polyps, gastritis, hemorrhoids  Continue your normal medications- NO IBUPROFEN, NAPROXEN OR OTHER NSAIDS (ADVIL, ALEVE, MOTRIN)- TYLENOL IS OK  Take Prilosec '40mg'$  twice daily for 8 weeks- take 30 minutes before breakfast and supper  Use Anusol HC cream- apply to rectum twice daily for 7 days  YOU HAD AN ENDOSCOPIC PROCEDURE TODAY AT [facility]:   Refer to the procedure report that was given to you for any specific questions about what was found during the examination.  If the procedure report does not answer your questions, please call your gastroenterologist to clarify.  If you requested that your care partner not be given the details of your procedure findings, then the procedure report has been included in a sealed envelope for you to review at your convenience later.  YOU SHOULD EXPECT: Some feelings of bloating in the abdomen. Passage of more gas than usual.  Walking can help get rid of the air that was put into your GI tract during the procedure and reduce the bloating. If you had a lower endoscopy (such as a colonoscopy or flexible sigmoidoscopy) you may notice spotting of blood in your stool or on the toilet paper. If you underwent a bowel prep for your procedure, you may not have a normal bowel movement for a few days.  Please Note:  You might notice some irritation and congestion in your nose or some drainage.  This is from the oxygen used during your procedure.  There is no need for concern and it should clear up in a day or so.  SYMPTOMS TO REPORT IMMEDIATELY:  Following lower endoscopy (colonoscopy or flexible sigmoidoscopy):  Excessive amounts of blood in the stool  Significant tenderness or worsening of abdominal pains  Swelling of the abdomen that is new, acute  Fever of 100F or higher  Following upper endoscopy (EGD)  Vomiting of blood or coffee ground material  New chest pain or pain under the shoulder  blades  Painful or persistently difficult swallowing  New shortness of breath  Fever of 100F or higher  Black, tarry-looking stools  For urgent or emergent issues, a gastroenterologist can be reached at any hour by calling [phone]. Do not use MyChart messaging for urgent concerns.    DIET:  We do recommend a small meal at first, but then you may proceed to your regular diet.  Drink plenty of fluids but you should avoid alcoholic beverages for 24 hours.  ACTIVITY:  You should plan to take it easy for the rest of today and you should NOT DRIVE or use heavy machinery until tomorrow (because of the sedation medicines used during the test).    FOLLOW UP: Our staff will call the number listed on your records 48-72 hours following your procedure to check on you and address any questions or concerns that you may have regarding the information given to you following your procedure. If we do not reach you, we will leave a message.  We will attempt to reach you two times.  During this call, we will ask if you have developed any symptoms of COVID 19. If you develop any symptoms (ie: fever, flu-like symptoms, shortness of breath, cough etc.) before then, please call [phone].  If you test positive for Covid 19 in the 2 weeks post procedure, please call and report this information to [place].    If any biopsies were taken you will be contacted by phone or by letter within  the next 1-3 weeks.  Please call us at [phone] if you have not heard about the biopsies in 3 weeks.    SIGNATURES/CONFIDENTIALITY: You and/or your care partner have signed paperwork which will be entered into your electronic medical record.  These signatures attest to the fact that that the information above on your After Visit Summary has been reviewed and is understood.  Full responsibility of the confidentiality of this discharge information lies with you and/or your care-partner.

## 2022-02-26 NOTE — Progress Notes (Signed)
A and O x3. Report to RN. Tolerated MAC anesthesia well.Teeth unchanged after procedure. 

## 2022-02-26 NOTE — Progress Notes (Signed)
Called to room to assist during endoscopic procedure.  Patient ID and intended procedure confirmed with present staff. Received instructions for my participation in the procedure from the performing physician.  

## 2022-02-26 NOTE — Op Note (Addendum)
[name] Patient Name: Kathryn Park Procedure Date: 02/26/2022 9:11 AM MRN: [id number] Endoscopist: Sonny Masters "Kathryn Park ,  Age: 36 Referring MD:  Date of Birth: 1986/08/12 Gender: Female Account #: [id number] Procedure:                Upper GI endoscopy Indications:              Heartburn, Melena Medicines:                Monitored Anesthesia Care Procedure:                Pre-Anesthesia Assessment:                           - Prior to the procedure, a History and Physical                            was performed, and patient medications and                            allergies were reviewed. The patient's tolerance of                            previous anesthesia was also reviewed. The risks                            and benefits of the procedure and the sedation                            options and risks were discussed with the patient.                            All questions were answered, and informed consent                            was obtained. Prior Anticoagulants: The patient has                            taken no previous anticoagulant or antiplatelet                            agents. ASA Grade Assessment: II - A patient with                            mild systemic disease. After reviewing the risks                            and benefits, the patient was deemed in                            satisfactory condition to undergo the procedure.                           After obtaining informed consent, the endoscope was  passed under direct vision. Throughout the                            procedure, the patient's blood pressure, pulse, and                            oxygen saturations were monitored continuously. The                            Olympus [id number] was introduced through the mouth,                            and advanced to the second part of duodenum. The                            upper GI endoscopy was  accomplished without                            difficulty. The patient tolerated the procedure                            well. Scope In: Scope Out: Findings:                 The examined esophagus was normal. Biopsies were                            taken with a cold forceps for histology.                           Localized inflammation characterized by congestion                            (edema), erosions and erythema was found in the                            gastric antrum. Biopsies were taken with a cold                            forceps for histology.                           A few localized erosions without bleeding were                            found in the duodenal bulb. Biopsies were taken                            with a cold forceps for histology. Complications:            No immediate complications. Estimated Blood Loss:     Estimated blood loss was minimal. Impression:               - Normal esophagus. Biopsied.                           -  Gastritis. Biopsied.                           - Duodenal erosions without bleeding. Biopsied. Recommendation:           - Use Prilosec (omeprazole) 40 mg PO BID for 8                            weeks.                           - No ibuprofen, naproxen, or other non-steroidal                            anti-inflammatory drugs.                           - Perform a colonoscopy today. Sonny Masters "Kathryn Park,  02/26/2022 9:47:57 AM

## 2022-03-02 ENCOUNTER — Telehealth: Payer: Self-pay

## 2022-03-02 ENCOUNTER — Ambulatory Visit (HOSPITAL_COMMUNITY)
Admission: RE | Admit: 2022-03-02 | Discharge: 2022-03-02 | Disposition: A | Discharge status: Home / Self Care | Payer: Commercial Managed Care - PPO | Source: Ambulatory Visit | Attending: Gynecologic Oncology | Admitting: Gynecologic Oncology

## 2022-03-02 ENCOUNTER — Telehealth: Payer: Self-pay | Admitting: *Deleted

## 2022-03-02 DIAGNOSIS — D3912 Neoplasm of uncertain behavior of left ovary: Secondary | ICD-10-CM | POA: Insufficient documentation

## 2022-03-02 DIAGNOSIS — R1900 Intra-abdominal and pelvic swelling, mass and lump, unspecified site: Secondary | ICD-10-CM | POA: Diagnosis present

## 2022-03-02 MED ORDER — GADOBUTROL 1 MMOL/ML IV SOLN
6.0000 mL | Freq: Once | INTRAVENOUS | Status: AC | PRN
  Administered 2022-03-02: 6 mL via INTRAVENOUS

## 2022-03-02 NOTE — Telephone Encounter (Signed)
No answer for post procedure call back. Left VM. 

## 2022-03-02 NOTE — Telephone Encounter (Signed)
No answer, left message to call back later today, B.Verdell Dykman RN. 

## 2022-03-03 ENCOUNTER — Encounter: Payer: Self-pay | Admitting: Internal Medicine

## 2022-03-16 ENCOUNTER — Telehealth: Payer: Self-pay | Admitting: Hematology and Oncology

## 2022-03-16 NOTE — Telephone Encounter (Signed)
Called patient regarding upcoming July appointments, patient is notified. 

## 2022-03-20 ENCOUNTER — Other Ambulatory Visit: Payer: Self-pay | Admitting: Internal Medicine

## 2022-03-20 DIAGNOSIS — K921 Melena: Secondary | ICD-10-CM

## 2022-03-20 DIAGNOSIS — K259 Gastric ulcer, unspecified as acute or chronic, without hemorrhage or perforation: Secondary | ICD-10-CM

## 2022-03-24 ENCOUNTER — Encounter: Payer: Self-pay | Admitting: Gynecologic Oncology

## 2022-04-08 DIAGNOSIS — M25651 Stiffness of right hip, not elsewhere classified: Secondary | ICD-10-CM | POA: Diagnosis not present

## 2022-04-08 DIAGNOSIS — Z9889 Other specified postprocedural states: Secondary | ICD-10-CM | POA: Diagnosis not present

## 2022-04-08 DIAGNOSIS — S73191S Other sprain of right hip, sequela: Secondary | ICD-10-CM | POA: Diagnosis not present

## 2022-04-08 DIAGNOSIS — R29898 Other symptoms and signs involving the musculoskeletal system: Secondary | ICD-10-CM | POA: Diagnosis not present

## 2022-04-08 DIAGNOSIS — Z7409 Other reduced mobility: Secondary | ICD-10-CM | POA: Diagnosis not present

## 2022-04-14 ENCOUNTER — Other Ambulatory Visit: Payer: Self-pay

## 2022-04-14 ENCOUNTER — Inpatient Hospital Stay: Payer: BC Managed Care – PPO | Attending: Hematology and Oncology | Admitting: Hematology and Oncology

## 2022-04-14 ENCOUNTER — Encounter: Payer: Self-pay | Admitting: Hematology and Oncology

## 2022-04-14 VITALS — BP 116/65 | HR 79 | Temp 98.8°F | Resp 16 | Ht 66.0 in | Wt 137.7 lb

## 2022-04-14 DIAGNOSIS — R29898 Other symptoms and signs involving the musculoskeletal system: Secondary | ICD-10-CM | POA: Diagnosis not present

## 2022-04-14 DIAGNOSIS — Z8543 Personal history of malignant neoplasm of ovary: Secondary | ICD-10-CM | POA: Insufficient documentation

## 2022-04-14 DIAGNOSIS — Z87891 Personal history of nicotine dependence: Secondary | ICD-10-CM | POA: Diagnosis not present

## 2022-04-14 DIAGNOSIS — Z90721 Acquired absence of ovaries, unilateral: Secondary | ICD-10-CM | POA: Diagnosis not present

## 2022-04-14 DIAGNOSIS — Z8051 Family history of malignant neoplasm of kidney: Secondary | ICD-10-CM | POA: Diagnosis not present

## 2022-04-14 DIAGNOSIS — M25651 Stiffness of right hip, not elsewhere classified: Secondary | ICD-10-CM | POA: Diagnosis not present

## 2022-04-14 DIAGNOSIS — Z9189 Other specified personal risk factors, not elsewhere classified: Secondary | ICD-10-CM | POA: Diagnosis not present

## 2022-04-14 DIAGNOSIS — Z803 Family history of malignant neoplasm of breast: Secondary | ICD-10-CM | POA: Diagnosis not present

## 2022-04-14 DIAGNOSIS — Z7409 Other reduced mobility: Secondary | ICD-10-CM | POA: Diagnosis not present

## 2022-04-14 DIAGNOSIS — Z9889 Other specified postprocedural states: Secondary | ICD-10-CM | POA: Diagnosis not present

## 2022-04-14 NOTE — Progress Notes (Signed)
[facility] FOLLOW UP NOTE  Patient Care Team: [name], [place] as PCP - General (Physician Assistant) Lafonda Mosses, MD as Consulting Physician (Gynecologic Oncology)  CHIEF COMPLAINTS/PURPOSE OF CONSULTATION:  High risk for breast cancer.  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  Her 5-year risk of breast cancer per [employer] model is 0.6% and her lifetime risk of breast cancer per [employer] model is 23%.  MRI breast in December 2022 without evidence of breast malignancy.  Since her last visit, she had a mammogram in April because of some abnormal findings during breast exam with her OB/GYN.  She also had evaluation for her history of ovarian cancer, follows up with Dr. Berline Lopes Here endoscopy and colonoscopy and these were unremarkable according to the patient. Physical examination today right breast upper outer quadrant abnormality which was thought to be benign in the past, no significant change concerning for malignancy and also left breast palpable abnormality at around 6:00 for which she recently had mammogram and ultrasound once again consistent with benign fibroglandular tissue.  At this time, I did not notice any examination findings concerning for malignancy.  She will be due for MRI in December.  We have once again discussed about the unknown long-term risk of gadolinium deposition with multiple MRIs.  MRI breast has been ordered.  She will return to clinic in 6 months or sooner as needed.  HISTORY OF PRESENTING ILLNESS:  Kathryn Park 36 y.o. female is here because of High risk for breast cancer  Oncology History  Granulosa cell tumor of left ovary  10/18/2019 Imaging   Pelvic ultrasound: 11 x 7 x 9 cm left ovarian cyst, avascular with thin septation.  No free fluid.  Right ovary  normal in appearance.   11/13/2019 Surgery   Laparoscopic left oophorectomy with collection of pelvic washings, lysis of adhesion.  Findings at the time of surgery were a 12 cm left ovarian cyst.  No ascites or intra-abdominal/pelvic disease.  Ovary was removed in a contained fashion and in an Endo Catch bag.   11/13/2019 Pathology Results   Left ovary showing granulosa cell tumor, 2.1 cm.  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 Pelvic washings negative for malignant cells.   11/13/2019 Initial Diagnosis   Granulosa cell tumor of left ovary   11/29/2019 Imaging   CT A/P: No acute findings in the abdomen or pelvis.  Specifically, no evidence for metastatic disease in the abdomen or pelvis. Trace free fluid in the cul-de-sac.  This can be physiologic in a premenopausal female.   12/27/2019 Tumor Marker   Patient's blood was tested for the following markers: Inh B, AMH Results of the tumor marker test revealed: 83, 1.07.    She was referred to breast clinic given her increased life time risk of breast cancer over 20 % based on TC model.  INTERIM HISTORY  Patient is here for a follow-up by herself.   Since last visit, she had MRI pelvis which did not show any evidence of malignancy.   She also had endoscopy and colonoscopy which were unremarkable according to the patient she ended up having a mammogram and ultrasound in April of this year because of some palpable abnormality in the left breast at around 5 to 6 o'clock position during her OB/GYN visit  Rest of the pertinent review  of systems reviewed and negative.  REVIEW OF SYSTEMS:   Constitutional: Denies fevers, chills or abnormal night sweats Eyes: Denies blurriness of vision, double vision or watery eyes Ears, nose, mouth, throat, and face: Denies mucositis or sore throat Respiratory: Denies cough, dyspnea or wheezes Cardiovascular: Denies palpitation, chest discomfort or lower extremity  swelling Gastrointestinal:  Denies nausea, heartburn or change in bowel habits Skin: Denies abnormal skin rashes Lymphatics: Denies new lymphadenopathy or easy bruising Neurological:Denies numbness, tingling or new weaknesses Behavioral/Psych: Mood is stable, no new changes  All other systems were reviewed with the patient and are negative.   MEDICAL HISTORY:  Past Medical History:  Diagnosis Date   Abnormal Pap smear of cervix    Allergy    Anemia    suspected poor diet; vegetarian during teens   Anxiety    Asthma    childhood   Depression    Dysmenorrhea    Family history of breast cancer    Family history of kidney cancer    Granulosa cell tumor of ovary    History of iron deficiency anemia    age 3   Joint pain    Labral tear of right hip joint    Leukocytosis    Low vitamin D level    Migraine with aura    never on rx or saw neurology   Ovarian cancer ([place]) 2021   Ovarian cyst    Left   Pneumonia 01/2012    left lower lobe   Pre-diabetes 2015   had a "borderline" A1c   STD (sexually transmitted disease)    HSV1   Wears contact lenses    Wears glasses     SURGICAL HISTORY: Past Surgical History:  Procedure Laterality Date   BREAST BIOPSY Right 04/2020   benign   COLPOSCOPY     LAPAROSCOPIC OVARIAN CYSTECTOMY Left 11/13/2019   Procedure: LAPAROSCOPIC OVARIAN CYSTECTOMY/POSSIBLE LEFT OOPHORECTOMY/ COLLECTION OF PELVIC WASHINGS/ks;  Surgeon: Nunzio Cobbs, MD;  Location: [facility];  Service: Gynecology;  Laterality: Left;  Possible left oophorectomy Colection of pelvic washings To follow first case of the day   LEFT OOPHORECTOMY Left 11/2019   right hip arthroscopy  01/2022   for laberal tear repair   TONSILLECTOMY     UPPER GI ENDOSCOPY      SOCIAL HISTORY: Social History   Socioeconomic History   Marital status: Married    Spouse name: Not on file   Number of children: 0   Years of education: Not on file   Highest  education level: Not on file  Occupational History    Comment: part time [employer]  Tobacco Use   Smoking status: Former    Packs/day: 1.00    Types: Cigarettes   Smokeless tobacco: Never  Vaping Use   Vaping Use: Never used  Substance and Sexual Activity   Alcohol use: Not Currently   Drug use: No   Sexual activity: Yes    Partners: Male    Birth control/protection: Other-see comments, Surgical    Comment: vasectomy  Other Topics Concern   Not on file  Social History Narrative   Moved from [place]   Married   Social Determinants of Health   Financial Resource Strain: Not on file  Food Insecurity: Not on file  Transportation Needs: Not on file  Physical Activity: Not on file  Stress: Not on file  Social Connections: Not on file  Intimate Partner  Violence: Not on file    FAMILY HISTORY: Family History  Problem Relation Age of Onset   Diabetes Mother    Hypertension Mother    Stroke Mother        TIA   Factor V Leiden deficiency Mother    Other Mother        pituitary tumor   Colon polyps Mother    Diverticulitis Mother    Kidney cancer Father        cancerous tumor of kidney   Ulcerative colitis Father    Other Brother        ear tumor, dx. age 30-6   Breast cancer Maternal Grandmother        dx. 80s   Stroke Maternal Grandfather    Rheum arthritis Maternal Great-grandmother    Cancer Other    Ovarian cancer Neg Hx    Endometrial cancer Neg Hx    Colon cancer Neg Hx     ALLERGIES:  is allergic to shellfish allergy, other, latex, and sulfa antibiotics.  MEDICATIONS:  Current Outpatient Medications  Medication Sig Dispense Refill   busPIRone (BUSPAR) 5 MG tablet Take 1 tablet (5 mg total) by mouth 3 (three) times daily as needed (anxiety). 90 tablet 1   cyanocobalamin 1000 MCG tablet Take 1,000 mcg by mouth daily.     escitalopram (LEXAPRO) 10 MG tablet Take 1 tablet (10 mg total) by mouth daily. Take daily for 2 weeks  leading up to menstrual cycle. 45 tablet 2   Fexofenadine-Pseudoephedrine (ALLEGRA-D 24 HOUR PO) Take 1 tablet by mouth as needed.     hydrocortisone (ANUSOL-HC) 2.5 % rectal cream Place 1 application. rectally 2 (two) times daily. Use twice daily for 7 days 30 g 1   Multiple Vitamins-Minerals (MULTIPLE VITAMINS/WOMENS PO) Take 1 tablet by mouth daily in the afternoon.     omeprazole (PRILOSEC) 40 MG capsule TAKE 1 CAPSULE BY MOUTH 2 TIMES DAILY. TAKE TWICE DAILY- 30 MINUTES BEFORE BREAKFAST AND SUPPER 60 capsule 2   valACYclovir (VALTREX) 1000 MG tablet Take 2 tablets (2000 mg) by mouth twice a day for 24 hours as needed for cold sore. 30 tablet 1   No current facility-administered medications for this visit.    PHYSICAL EXAMINATION: ECOG PERFORMANCE STATUS: 0 - Asymptomatic  There were no vitals filed for this visit.    There were no vitals filed for this visit.   Physical Exam Chest:       Comments: Palpable abnormality right breast upper outer quadrant appears to be stable compared to last visit.  This feels benign on exam.  Left breast 5 to 6 o'clock position palpable abnormality which was thought to be fibroglandular tissue on her most recent imaging.  I agree with this findings.  No other regional adenopathy     GENERAL:alert, no distress and comfortable  LABORATORY DATA:  I have reviewed the data as listed Lab Results  Component Value Date   WBC 9.4 02/23/2022   HGB 13.3 02/23/2022   HCT 39.3 02/23/2022   MCV 86.5 02/23/2022   PLT 203.0 02/23/2022     Chemistry      Component Value Date/Time   NA 141 12/23/2021 1507   NA 139 10/11/2019 1013   K 4.0 12/23/2021 1507   CL 104 12/23/2021 1507   CO2 31 12/23/2021 1507   BUN 9 12/23/2021 1507   BUN 12 10/11/2019 1013   CREATININE 0.72 12/23/2021 1507   CREATININE 0.75 09/04/2020 1150  Component Value Date/Time   CALCIUM 9.0 12/23/2021 1507   ALKPHOS 46 12/23/2021 1507   AST 14 12/23/2021 1507   AST 11  (L) 09/04/2020 1150   ALT 11 12/23/2021 1507   ALT 8 09/04/2020 1150   BILITOT 0.3 12/23/2021 1507   BILITOT 0.3 09/04/2020 1150      RADIOGRAPHIC STUDIES: I have personally reviewed the radiological images as listed and agreed with the findings in the report. No results found.  I spent 30 minutes in the care of this patient including history, physical examination, review of records, counseling and documentation.  All questions were answered. The patient knows to call the clinic with any problems, questions or concerns. She will follow-up with [place] in about 6 months    Benay Pike, MD 04/14/2022 8:18 AM

## 2022-04-16 ENCOUNTER — Telehealth: Payer: Self-pay

## 2022-04-16 DIAGNOSIS — F321 Major depressive disorder, single episode, moderate: Secondary | ICD-10-CM | POA: Diagnosis not present

## 2022-04-16 DIAGNOSIS — F411 Generalized anxiety disorder: Secondary | ICD-10-CM | POA: Diagnosis not present

## 2022-04-16 NOTE — Telephone Encounter (Signed)
Received call from patient regarding an ultrasound. Patient reports she received a call from radiology to schedule her ultrasound in July. Patient thought she was not due until October since she had the CT scan and MRI in May. Advised patient that we will clarify with Dr. Berline Lopes if ultrasound is needed at this time and someone from the office will call and follow up with her. Patient verbalized understanding.

## 2022-04-16 NOTE — Telephone Encounter (Signed)
Attempted to follow up with patient regarding her ultrasound. Unable to contact patient, left message requesting return call.  Per Dr. Berline Lopes patient does not need an ultrasound in July and will need to follow up 6 months from the CT and MRI scans (May). Patient due for a follow up appointment with Dr Berline Lopes in November.

## 2022-04-19 NOTE — Telephone Encounter (Signed)
Spoke with pt this morning who stated that she was returning Lacy's phone call. Informed pt that Per Dr. Berline Lopes patient does not need an ultrasound in July and will need to follow up 6 months from the CT and MRI scans (May). Patient due for a follow up appointment with Dr Berline Lopes in November. She can call 2 months before to schedule her appt. Pt verbalized understanding.

## 2022-04-23 DIAGNOSIS — F321 Major depressive disorder, single episode, moderate: Secondary | ICD-10-CM | POA: Diagnosis not present

## 2022-04-23 DIAGNOSIS — F411 Generalized anxiety disorder: Secondary | ICD-10-CM | POA: Diagnosis not present

## 2022-04-30 DIAGNOSIS — F321 Major depressive disorder, single episode, moderate: Secondary | ICD-10-CM | POA: Diagnosis not present

## 2022-04-30 DIAGNOSIS — F411 Generalized anxiety disorder: Secondary | ICD-10-CM | POA: Diagnosis not present

## 2022-05-03 DIAGNOSIS — Z7409 Other reduced mobility: Secondary | ICD-10-CM | POA: Diagnosis not present

## 2022-05-03 DIAGNOSIS — Z9889 Other specified postprocedural states: Secondary | ICD-10-CM | POA: Diagnosis not present

## 2022-05-03 DIAGNOSIS — M25651 Stiffness of right hip, not elsewhere classified: Secondary | ICD-10-CM | POA: Diagnosis not present

## 2022-05-03 DIAGNOSIS — R29898 Other symptoms and signs involving the musculoskeletal system: Secondary | ICD-10-CM | POA: Diagnosis not present

## 2022-05-07 DIAGNOSIS — F321 Major depressive disorder, single episode, moderate: Secondary | ICD-10-CM | POA: Diagnosis not present

## 2022-05-07 DIAGNOSIS — F411 Generalized anxiety disorder: Secondary | ICD-10-CM | POA: Diagnosis not present

## 2022-05-08 DIAGNOSIS — F411 Generalized anxiety disorder: Secondary | ICD-10-CM | POA: Diagnosis not present

## 2022-05-08 DIAGNOSIS — F321 Major depressive disorder, single episode, moderate: Secondary | ICD-10-CM | POA: Diagnosis not present

## 2022-05-13 ENCOUNTER — Telehealth: Payer: Self-pay | Admitting: *Deleted

## 2022-05-13 NOTE — Telephone Encounter (Signed)
Patient called reports doing well on Lexapro 10 mg tablet. Patient asked if you would be wiling to change the Rx to 20 mg dose and she cut the pill in half and still take 10 mg? She reports it will be cheaper for her to get this dose. She will be using a online pharmacy called [facility], if you approved patient will need at new Rx. Please advise

## 2022-05-13 NOTE — Telephone Encounter (Signed)
Ok to switch to Lexapro 20 mg.  Sig:  take 1/2 tab (10 mg) by mouth daily.  She can have refills until her annual exam is due with me in February, 2024.

## 2022-05-14 DIAGNOSIS — F411 Generalized anxiety disorder: Secondary | ICD-10-CM | POA: Diagnosis not present

## 2022-05-14 DIAGNOSIS — F321 Major depressive disorder, single episode, moderate: Secondary | ICD-10-CM | POA: Diagnosis not present

## 2022-05-14 MED ORDER — ESCITALOPRAM OXALATE 20 MG PO TABS
ORAL_TABLET | ORAL | 1 refills | Status: DC
Start: 2022-05-14 — End: 2022-09-29

## 2022-05-14 NOTE — Telephone Encounter (Signed)
Left message for patient to call.

## 2022-05-14 NOTE — Telephone Encounter (Signed)
Patient informed, Rx sent.  

## 2022-05-21 DIAGNOSIS — F321 Major depressive disorder, single episode, moderate: Secondary | ICD-10-CM | POA: Diagnosis not present

## 2022-05-21 DIAGNOSIS — F411 Generalized anxiety disorder: Secondary | ICD-10-CM | POA: Diagnosis not present

## 2022-05-24 DIAGNOSIS — M25551 Pain in right hip: Secondary | ICD-10-CM | POA: Diagnosis not present

## 2022-05-24 DIAGNOSIS — Z9889 Other specified postprocedural states: Secondary | ICD-10-CM | POA: Diagnosis not present

## 2022-05-27 ENCOUNTER — Telehealth: Payer: Self-pay

## 2022-05-27 NOTE — Telephone Encounter (Signed)
Scheduled Kathryn Park for a follow up appointment with Dr. Berline Lopes on 08-31-22. Told her that she will be called with the [place] compete appointment which will be done a few days prior to her appointment along with her lab work. Told her to call the office in mid September if she has not heard from this office with [place] and lab appointments. Pt verbalized understanding.

## 2022-05-28 ENCOUNTER — Telehealth: Payer: Self-pay | Admitting: *Deleted

## 2022-05-28 DIAGNOSIS — F321 Major depressive disorder, single episode, moderate: Secondary | ICD-10-CM | POA: Diagnosis not present

## 2022-05-28 DIAGNOSIS — F411 Generalized anxiety disorder: Secondary | ICD-10-CM | POA: Diagnosis not present

## 2022-05-28 NOTE — Telephone Encounter (Signed)
Patient scheduled for an [place] scan and labs for November 22 nd. Patient given the date and time

## 2022-06-04 ENCOUNTER — Encounter: Payer: Self-pay | Admitting: Physician Assistant

## 2022-06-04 ENCOUNTER — Ambulatory Visit (INDEPENDENT_AMBULATORY_CARE_PROVIDER_SITE_OTHER): Payer: BC Managed Care – PPO | Admitting: Physician Assistant

## 2022-06-04 VITALS — BP 100/60 | HR 76 | Temp 98.1°F | Ht 66.0 in | Wt 136.4 lb

## 2022-06-04 DIAGNOSIS — M2520 Flail joint, unspecified joint: Secondary | ICD-10-CM | POA: Diagnosis not present

## 2022-06-04 DIAGNOSIS — N3289 Other specified disorders of bladder: Secondary | ICD-10-CM | POA: Diagnosis not present

## 2022-06-04 DIAGNOSIS — N393 Stress incontinence (female) (male): Secondary | ICD-10-CM | POA: Diagnosis not present

## 2022-06-04 DIAGNOSIS — Z23 Encounter for immunization: Secondary | ICD-10-CM | POA: Diagnosis not present

## 2022-06-04 NOTE — Patient Instructions (Signed)
It was great to see you!  I will place referral for [place] pelvic floor PT  I will place referral for Dr. [name] -- if they try to schedule with a different provider in his office, please push to see him specifically  Please go ahead and make follow-up appt with your urologist for after pelvic floor PT  Take care,  [name] PA-C

## 2022-06-04 NOTE — Progress Notes (Signed)
Kathryn Park is a 36 y.o. female here for a follow up of a pre-existing problem.  History of Present Illness:   Chief Complaint  Patient presents with   Urinary Incontinence    Pt c/o bladder spasms and incontinence with coughing and some exercises x 1 month.    She is here with her husband, Wilfred Lacy.  HPI  Joint laxity; Arthralgia She has been reading about hypermobile EDS and feels as though her signs and symptoms resonate with this diagnosis. She is wanting further evaluation of this. Of note, she saw Dr. Vernelle Emerald in February 2022 for positive ANA, hip pain and fatigue and was told that she does not have an inflammatory process.  Urinary incontinence She is experiencing bladder spasms and incontinence with coughing. She had COVID for the first time in mid to late July. With all of th coughing, she noticed urinary leakage. She has seen urology (Dr. Claudia Desanctis -- notes in media file -- reviewed) for urinary frequency. She attempted physical therapy x 1 but was not prepared for the type of exam/exercises. She is ready to try this again. Notes report that she should follow-up with Dr. Claudia Desanctis after pelvic floor PT to consider further work-up, such as cystoscopy. She has not done this.  She has had some issues with constipation and "altered bowel habits" but does not think that this is causing frequency. She consumes at least 64 oz of water daily.   Past Medical History:  Diagnosis Date   Abnormal Pap smear of cervix    Allergy    Anemia    suspected poor diet; vegetarian during teens   Anxiety    Asthma    childhood   Depression    Dysmenorrhea    Family history of breast cancer    Family history of kidney cancer    Granulosa cell tumor of ovary    History of iron deficiency anemia    age 78   Joint pain    Labral tear of right hip joint    Leukocytosis    Low vitamin D level    Migraine with aura    never on rx or saw neurology   Ovarian cancer ([place]) 2021    Ovarian cyst    Left   Pneumonia 01/2012    left lower lobe   Pre-diabetes 2015   had a "borderline" A1c   STD (sexually transmitted disease)    HSV1   Wears contact lenses    Wears glasses      Social History   Tobacco Use   Smoking status: Former    Packs/day: 1.00    Types: Cigarettes   Smokeless tobacco: Never  Vaping Use   Vaping Use: Never used  Substance Use Topics   Alcohol use: Not Currently   Drug use: No    Past Surgical History:  Procedure Laterality Date   BREAST BIOPSY Right 04/2020   benign   COLPOSCOPY     LAPAROSCOPIC OVARIAN CYSTECTOMY Left 11/13/2019   Procedure: LAPAROSCOPIC OVARIAN CYSTECTOMY/POSSIBLE LEFT OOPHORECTOMY/ COLLECTION OF PELVIC WASHINGS/ks;  Surgeon: Nunzio Cobbs, MD;  Location: [facility];  Service: Gynecology;  Laterality: Left;  Possible left oophorectomy Colection of pelvic washings To follow first case of the day   LEFT OOPHORECTOMY Left 11/2019   right hip arthroscopy  01/2022   for laberal tear repair   TONSILLECTOMY     UPPER GI ENDOSCOPY      Family History  Problem  Relation Age of Onset   Diabetes Mother    Hypertension Mother    Stroke Mother        TIA   Factor V Leiden deficiency Mother    Other Mother        pituitary tumor   Colon polyps Mother    Diverticulitis Mother    Kidney cancer Father        cancerous tumor of kidney   Ulcerative colitis Father    Other Brother        ear tumor, dx. age 15-6   Breast cancer Maternal Grandmother        dx. 80s   Stroke Maternal Grandfather    Rheum arthritis Maternal Great-grandmother    Cancer Other    Ovarian cancer Neg Hx    Endometrial cancer Neg Hx    Colon cancer Neg Hx     Allergies  Allergen Reactions   Shellfish Allergy Anaphylaxis    Throat closes   Other Other (See Comments)    Watermelon, mold/mildew, pet dander, dust and dust mites  Swelling of eyes and congestion   Latex Rash   Sulfa Antibiotics Rash     Current Medications:   Current Outpatient Medications:    busPIRone (BUSPAR) 5 MG tablet, Take 1 tablet (5 mg total) by mouth 3 (three) times daily as needed (anxiety)., Disp: 90 tablet, Rfl: 1   cyanocobalamin 1000 MCG tablet, Take 1,000 mcg by mouth daily., Disp: , Rfl:    escitalopram (LEXAPRO) 20 MG tablet, Take 1/2 tablet ('10mg'$ ) tablet by mouth day. (Patient taking differently: Take 1/2 tablet ('10mg'$ ) tablet by mouth daily for 2 weeks out of cycle), Disp: 45 tablet, Rfl: 1   Fexofenadine-Pseudoephedrine (ALLEGRA-D 24 HOUR PO), Take 1 tablet by mouth as needed., Disp: , Rfl:    Multiple Vitamins-Minerals (MULTIPLE VITAMINS/WOMENS PO), Take 1 tablet by mouth daily in the afternoon., Disp: , Rfl:    valACYclovir (VALTREX) 1000 MG tablet, Take 2 tablets (2000 mg) by mouth twice a day for 24 hours as needed for cold sore., Disp: 30 tablet, Rfl: 1   Review of Systems:   ROS Negative unless otherwise specified per HPI.  Vitals:   Vitals:   06/04/22 0830  BP: 100/60  Pulse: 76  Temp: 98.1 F (36.7 C)  TempSrc: Temporal  SpO2: 97%  Weight: 136 lb 6.1 oz (61.9 kg)  Height: '5\' 6"'$  (1.676 m)     Body mass index is 22.01 kg/m.  Physical Exam:   Physical Exam Vitals and nursing note reviewed.  Constitutional:      General: She is not in acute distress.    Appearance: She is well-developed. She is not ill-appearing or toxic-appearing.  Cardiovascular:     Rate and Rhythm: Normal rate and regular rhythm.     Pulses: Normal pulses.     Heart sounds: Normal heart sounds, S1 normal and S2 normal.  Pulmonary:     Effort: Pulmonary effort is normal.     Breath sounds: Normal breath sounds.  Abdominal:     General: Abdomen is flat. Bowel sounds are normal.     Palpations: Abdomen is soft.     Tenderness: There is no abdominal tenderness.  Skin:    General: Skin is warm and dry.  Neurological:     Mental Status: She is alert.     GCS: GCS eye subscore is 4. GCS verbal subscore  is 5. GCS motor subscore is 6.  Psychiatric:  Speech: Speech normal.        Behavior: Behavior normal. Behavior is cooperative.     Assessment and Plan:   Stress incontinence; Bladder spasms Referral to pelvic floor PT Advised her to follow-up with urologist as was recommended by Dr. Claudia Desanctis after completion of pelvic floor PT No acute symptoms today warranting need for UA  Joint laxity Referral to Dr. [name] at sports medicine Consider referral to [facility] PT for [place], PA-C

## 2022-06-09 NOTE — Progress Notes (Signed)
   I, Peterson Lombard, LAT, ATC acting as a scribe for [name], MD.  Kathryn Park is a 36 y.o. female who presents to [place] at [facility] today for further evaluation of her hypermobility. Pt was last seen by Dr. [name] on 11/24/21 for chronic R hip pain that she had surgery on by Dr. Aretha Parrot on 01/21/22. Of note, pt is currently being seen by oncology for a Granulosa cell tumor of the L ovary. Today, pt reports PT mentioned the pathology of EDS. Pt discussed EDS w/ PCP and was referred to see Dr. [name]. Pt locates hypermobility to bilat hip, wrist, shoulders primarily.   Dx imaging: 03/02/22 Pelvis MRI  11/24/21 Sacrum/coccyx XR   Pertinent review of systems: No fevers or chills.  Positive for pelvic/GI symptoms.  Relevant historical information: History of granulosa cell ovarian cancer.   Exam:  BP 108/68   Pulse 80   Ht $R'5\' 6"'rf$  (1.676 m)   Wt 134 lb 3.2 oz (60.9 kg)   LMP 05/07/2022 (Approximate)   SpO2 97%   BMI 21.66 kg/m  General: Well Developed, well nourished, and in no acute distress.   MSK:   Positive Beighton hypermobility score 7/9. Completed the diagnostic criteria checklist for hypermobile Erler's Danlos syndrome.  Patient met criteria for mild skin hyperextensibility, stretch marks, bilateral Pisa genic papules of the heel, history of dental crowding, and arachnodactyly.     Assessment and Plan: 36 y.o. female with hypermobility syndrome.  Patient meets diagnostic criteria at this time for hypermobile Ehlers-Danlos syndrome. This really does not change much about her but it does help provide some clarity and treatment direction going forward.  We discussed the possibility of screening for aortic aneurysm.  I think this is not a priority at this time but certainly could proceed to echocardiogram in the future if needed.  Recheck as needed. Total encounter time 30 minutes including face-to-face time with the patient and,  reviewing past medical record, and charting on the date of service.       Discussed warning signs or symptoms. Please see discharge instructions. Patient expresses understanding.   The above documentation has been reviewed and is accurate and complete [name], M.D.

## 2022-06-10 ENCOUNTER — Ambulatory Visit: Payer: BC Managed Care – PPO | Admitting: Family Medicine

## 2022-06-10 DIAGNOSIS — Q796 Ehlers-Danlos syndrome, unspecified: Secondary | ICD-10-CM | POA: Insufficient documentation

## 2022-06-10 NOTE — Patient Instructions (Addendum)
Thank you for coming in today.   Continue stability exercises.   Happy to get an ECHO whenever.   Pelvic PT should be helpful.

## 2022-06-11 DIAGNOSIS — F411 Generalized anxiety disorder: Secondary | ICD-10-CM | POA: Diagnosis not present

## 2022-06-11 DIAGNOSIS — F321 Major depressive disorder, single episode, moderate: Secondary | ICD-10-CM | POA: Diagnosis not present

## 2022-06-25 DIAGNOSIS — F411 Generalized anxiety disorder: Secondary | ICD-10-CM | POA: Diagnosis not present

## 2022-06-25 DIAGNOSIS — F321 Major depressive disorder, single episode, moderate: Secondary | ICD-10-CM | POA: Diagnosis not present

## 2022-06-30 ENCOUNTER — Ambulatory Visit: Payer: BC Managed Care – PPO | Attending: Physician Assistant | Admitting: Physical Therapy

## 2022-06-30 ENCOUNTER — Other Ambulatory Visit: Payer: Self-pay

## 2022-06-30 DIAGNOSIS — M62838 Other muscle spasm: Secondary | ICD-10-CM | POA: Diagnosis not present

## 2022-06-30 DIAGNOSIS — N3289 Other specified disorders of bladder: Secondary | ICD-10-CM | POA: Diagnosis not present

## 2022-06-30 DIAGNOSIS — R279 Unspecified lack of coordination: Secondary | ICD-10-CM | POA: Insufficient documentation

## 2022-06-30 DIAGNOSIS — N393 Stress incontinence (female) (male): Secondary | ICD-10-CM | POA: Diagnosis not present

## 2022-06-30 DIAGNOSIS — M6281 Muscle weakness (generalized): Secondary | ICD-10-CM | POA: Insufficient documentation

## 2022-06-30 NOTE — Patient Instructions (Signed)
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
## 2022-07-01 ENCOUNTER — Ambulatory Visit (INDEPENDENT_AMBULATORY_CARE_PROVIDER_SITE_OTHER): Payer: BC Managed Care – PPO | Admitting: Physician Assistant

## 2022-07-01 ENCOUNTER — Encounter: Payer: Self-pay | Admitting: Physician Assistant

## 2022-07-01 VITALS — BP 110/78 | HR 75 | Temp 97.5°F | Ht 66.0 in | Wt 131.4 lb

## 2022-07-01 DIAGNOSIS — Q796 Ehlers-Danlos syndrome, unspecified: Secondary | ICD-10-CM | POA: Diagnosis not present

## 2022-07-01 DIAGNOSIS — F909 Attention-deficit hyperactivity disorder, unspecified type: Secondary | ICD-10-CM

## 2022-07-01 MED ORDER — AMPHETAMINE-DEXTROAMPHETAMINE 5 MG PO TABS: 5.0000 mg | ORAL_TABLET | Freq: Every day | ORAL | 0 refills | Status: DC

## 2022-07-01 NOTE — Patient Instructions (Addendum)
It was great to see you!  Consider melatonin and magnesium for sleep  I'm placing referral to Tomie China (physical therapist with [facility]) to evaluate EDS and help [place] navigate this process  Start 5 mg adderall - message if need increase  Try to call Donetta and if needed, I can re-refer you.  Let's follow-up in 3 months, sooner if you have concerns.  Take care,  [name] PA-C

## 2022-07-01 NOTE — Progress Notes (Signed)
Kathryn Park Braelyn Jenson is a 36 y.o. female here for a follow up of a pre-existing problem.  History of Present Illness:   Chief Complaint  Patient presents with   Follow-up    F/u from Dr. [name] visit Dx with Hypermobile Ehlers-Danlos syndrome.   ADHD    Pt would like to discuss starting medication.    HPI  EDS  Dx by Dr [name] 06/10/22  Cardio -- has been lots of electrolytes and doing compression stockings regularly, this has helped. Denies chest pain. Does get palpitations but this has been attributed to stress in the past.  Neuro -- having symptoms of feeling like parts of legs and her face are "wet." Getting twitching in her muscles leg. Having issues where her joints are working well and are "offline", in her knee and hip she can feel like a sudden unsteadiness.  Sleep -- going to bed early and not getting restful sleep. Has not tried anything OTC for sx.   ADHD Having trouble maintaining focus. Feeling forgetful and leaving things around that she doesn't normally. In mid-20's was on 5 mg adderall, took 1/2 a tab -- this was effective Wanting to try this again, will likely take every day.   Past Medical History:  Diagnosis Date   Abnormal Pap smear of cervix    Allergy    Anemia    suspected poor diet; vegetarian during teens   Anxiety    Asthma    childhood   Depression    Dysmenorrhea    Family history of breast cancer    Family history of kidney cancer    Granulosa cell tumor of ovary    History of iron deficiency anemia    age 47   Joint pain    Labral tear of right hip joint    Leukocytosis    Low vitamin D level    Migraine with aura    never on rx or saw neurology   Ovarian cancer ([place]) 2021   Ovarian cyst    Left   Pneumonia 01/2012    left lower lobe   Pre-diabetes 2015   had a "borderline" A1c   STD (sexually transmitted disease)    HSV1   Wears contact lenses    Wears glasses      Social History   Tobacco Use   Smoking  status: Former    Packs/day: 1.00    Types: Cigarettes   Smokeless tobacco: Never  Vaping Use   Vaping Use: Never used  Substance Use Topics   Alcohol use: Not Currently   Drug use: No    Past Surgical History:  Procedure Laterality Date   BREAST BIOPSY Right 04/2020   benign   COLPOSCOPY     LAPAROSCOPIC OVARIAN CYSTECTOMY Left 11/13/2019   Procedure: LAPAROSCOPIC OVARIAN CYSTECTOMY/POSSIBLE LEFT OOPHORECTOMY/ COLLECTION OF PELVIC WASHINGS/ks;  Surgeon: Nunzio Cobbs, MD;  Location: [facility];  Service: Gynecology;  Laterality: Left;  Possible left oophorectomy Colection of pelvic washings To follow first case of the day   LEFT OOPHORECTOMY Left 11/2019   right hip arthroscopy  01/2022   for laberal tear repair   TONSILLECTOMY     UPPER GI ENDOSCOPY      Family History  Problem Relation Age of Onset   Diabetes Mother    Hypertension Mother    Stroke Mother        TIA   Factor V Leiden deficiency Mother    Other Mother  pituitary tumor   Colon polyps Mother    Diverticulitis Mother    Kidney cancer Father        cancerous tumor of kidney   Ulcerative colitis Father    Other Brother        ear tumor, dx. age 14-6   Breast cancer Maternal Grandmother        dx. 80s   Stroke Maternal Grandfather    Rheum arthritis Maternal Great-grandmother    Cancer Other    Ovarian cancer Neg Hx    Endometrial cancer Neg Hx    Colon cancer Neg Hx     Allergies  Allergen Reactions   Shellfish Allergy Anaphylaxis    Throat closes   Other Other (See Comments)    Watermelon, mold/mildew, pet dander, dust and dust mites  Swelling of eyes and congestion   Latex Rash   Sulfa Antibiotics Rash    Current Medications:   Current Outpatient Medications:    busPIRone (BUSPAR) 5 MG tablet, Take 1 tablet (5 mg total) by mouth 3 (three) times daily as needed (anxiety)., Disp: 90 tablet, Rfl: 1   cetirizine (ZYRTEC) 10 MG tablet, Take 10 mg by mouth  daily., Disp: , Rfl:    cyanocobalamin 1000 MCG tablet, Take 1,000 mcg by mouth daily., Disp: , Rfl:    escitalopram (LEXAPRO) 20 MG tablet, Take 1/2 tablet ('10mg'$ ) tablet by mouth day. (Patient taking differently: Take 1/2 tablet ('10mg'$ ) tablet by mouth daily for 2 weeks out of cycle), Disp: 45 tablet, Rfl: 1   Multiple Vitamins-Minerals (MULTIPLE VITAMINS/WOMENS PO), Take 1 tablet by mouth daily in the afternoon., Disp: , Rfl:    valACYclovir (VALTREX) 1000 MG tablet, Take 2 tablets (2000 mg) by mouth twice a day for 24 hours as needed for cold sore., Disp: 30 tablet, Rfl: 1   Review of Systems:   ROS Negative unless otherwise specified per HPI.  Vitals:   Vitals:   07/01/22 1030  BP: 110/78  Pulse: 75  Temp: (!) 97.5 F (36.4 C)  TempSrc: Temporal  SpO2: 98%  Weight: 131 lb 6.1 oz (59.6 kg)  Height: '5\' 6"'$  (1.676 m)     Body mass index is 21.21 kg/m.  Physical Exam:   Physical Exam Vitals and nursing note reviewed.  Constitutional:      General: She is not in acute distress.    Appearance: She is well-developed. She is not ill-appearing or toxic-appearing.  Cardiovascular:     Rate and Rhythm: Normal rate and regular rhythm.     Pulses: Normal pulses.     Heart sounds: Normal heart sounds, S1 normal and S2 normal.  Pulmonary:     Effort: Pulmonary effort is normal.     Breath sounds: Normal breath sounds.  Skin:    General: Skin is warm and dry.  Neurological:     Mental Status: She is alert.     GCS: GCS eye subscore is 4. GCS verbal subscore is 5. GCS motor subscore is 6.  Psychiatric:        Speech: Speech normal.        Behavior: Behavior normal. Behavior is cooperative.     Assessment and Plan:   EDS (Ehlers-Danlos syndrome) Reviewed ongoing symptoms Discussed referral to cardiology but she will hold off on this at this time Discussed neuro referral but we are going to have her see EDS PT, Jen Pa, to help [place] navigate her symptoms If new/worsening sx  arise, will reconsider referral to specialists  Attention deficit hyperactivity disorder (ADHD), unspecified ADHD type Uncontrolled Restart 5 mg adderall daily Follow-up in 3 months, sooner if concerns   [name], PA-C

## 2022-07-02 DIAGNOSIS — F411 Generalized anxiety disorder: Secondary | ICD-10-CM | POA: Diagnosis not present

## 2022-07-02 DIAGNOSIS — F321 Major depressive disorder, single episode, moderate: Secondary | ICD-10-CM | POA: Diagnosis not present

## 2022-07-02 MED ORDER — AMPHETAMINE-DEXTROAMPHETAMINE 5 MG PO TABS: 5.0000 mg | ORAL_TABLET | Freq: Every day | ORAL | 0 refills | Status: DC

## 2022-07-02 NOTE — Telephone Encounter (Signed)
Pt needs Adderall sent to different pharmacy. I changed the pharmacy.

## 2022-07-09 ENCOUNTER — Ambulatory Visit: Payer: BC Managed Care – PPO | Admitting: Physical Therapy

## 2022-07-09 DIAGNOSIS — F321 Major depressive disorder, single episode, moderate: Secondary | ICD-10-CM | POA: Diagnosis not present

## 2022-07-09 DIAGNOSIS — F411 Generalized anxiety disorder: Secondary | ICD-10-CM | POA: Diagnosis not present

## 2022-07-13 NOTE — Therapy (Unsigned)
OUTPATIENT PHYSICAL THERAPY LOWER EXTREMITY EVALUATION   Patient Name: Kathryn Park MRN: [id number] DOB:[birth year], 36 y.o., female Today's Date: 07/14/2022   PT End of Session - 07/14/22 0938     Visit Number 1    Number of Visits 12    Date for PT Re-Evaluation 09/08/22    Authorization Type BCBS    PT Start Time 0935    PT Stop Time 1020    PT Time Calculation (min) 45 min    Activity Tolerance Patient tolerated treatment well    Behavior During Therapy WFL for tasks assessed/performed             Past Medical History:  Diagnosis Date   Abnormal Pap smear of cervix    Allergy    Anemia    suspected poor diet; vegetarian during teens   Anxiety    Asthma    childhood   Depression    Dysmenorrhea    Family history of breast cancer    Family history of kidney cancer    Granulosa cell tumor of ovary    History of iron deficiency anemia    age 64   Joint pain    Labral tear of right hip joint    Leukocytosis    Low vitamin D level    Migraine with aura    never on rx or saw neurology   Ovarian cancer ([place]) 2021   Ovarian cyst    Left   Pneumonia 01/2012    left lower lobe   Pre-diabetes 2015   had a "borderline" A1c   STD (sexually transmitted disease)    HSV1   Wears contact lenses    Wears glasses    Past Surgical History:  Procedure Laterality Date   BREAST BIOPSY Right 04/2020   benign   COLPOSCOPY     LAPAROSCOPIC OVARIAN CYSTECTOMY Left 11/13/2019   Procedure: LAPAROSCOPIC OVARIAN CYSTECTOMY/POSSIBLE LEFT OOPHORECTOMY/ COLLECTION OF PELVIC WASHINGS/ks;  Surgeon: Nunzio Cobbs, MD;  Location: [facility];  Service: Gynecology;  Laterality: Left;  Possible left oophorectomy Colection of pelvic washings To follow first case of the day   LEFT OOPHORECTOMY Left 11/2019   right hip arthroscopy  01/2022   for laberal tear repair   TONSILLECTOMY     UPPER GI ENDOSCOPY     Patient Active  Problem List   Diagnosis Date Noted   EDS (Ehlers-Danlos syndrome) 06/10/2022   Tear of right acetabular labrum 07/22/2021   Breast lump 02/12/2021   Bilateral hip pain 11/25/2020   Positive ANA (antinuclear antibody) 11/25/2020   Other fatigue 11/25/2020   Bladder retention 10/13/2020   HSV-1 (herpes simplex virus 1) infection 10/13/2020   History of anorexia nervosa 10/13/2020   GAD (generalized anxiety disorder) 10/13/2020   Family history of breast cancer    Family history of kidney cancer    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    PCP: [name], PA  REFERRING PROVIDER: [name], PA Dr. [name]  REFERRING DIAG: [name] syndrome   THERAPY DIAG:  Ehlers-Danlos syndrome type III  Hypermobility syndrome  Muscle weakness (generalized)  Polyarthralgia  Rationale for Evaluation and Treatment Rehabilitation  ONSET DATE: chronic   SUBJECTIVE:   SUBJECTIVE STATEMENT: Patient presents with new diagnosis of EDS with hypermobility.  She has dealt with a great deal of joint problems in the past.  She reports since learning about this issue things all make  sense to her.  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  She will not be working much longer there.    She is limited by pain but more so, weakness, fatigue.  "I feel like I have gotten used to pain" Uses compression and electrolytes for POTS symptoms.  Pain in mostly in hips, shoulders, wrists, fingers.  Weakness in knees and ankles. Often feels like she is unsteady and hips "give out" when standing.    Prior to hip surgery, PT didn't help much but afterwards, it did.  She reports enjoying walking, hiking and yoga in the past.  Not much in to sports or strength training.     PERTINENT HISTORY: See above Rt hip labral repair Ovarian cancer POTS Migraines  surgery on by Dr. Aretha Parrot on 01/21/22.  PAIN:  Are you having pain? Yes: NPRS scale: not much today /10 Pain  location: Rt hip at times  Pain description: sore Aggravating factors: hip- prolonged posiitoning  Relieving factors: heat, rest, yoga ball , movement   PRECAUTIONS: Other: Ligament laxity/joint instability , POTS, monitor symptoms   WEIGHT BEARING RESTRICTIONS No  FALLS:  Has patient fallen in last 6 months? No Does report unsteadiness  LIVING ENVIRONMENT: Lives with: lives with their family Lives in: House/apartment Stairs:  - Has following equipment at home: None  OCCUPATION: Currently part time retail . Furniture Archivist.   PLOF: Independent  PATIENT GOALS I just want to do normal things and learn how to reduce pain, get stronger.    OBJECTIVE:   DIAGNOSTIC FINDINGS: NONE   PATIENT SURVEYS:  BIS survey given on eval   COGNITION:  Overall cognitive status: Within functional limits for tasks assessed     SENSATION: WFL  EDEMA:  NA  MUSCLE LENGTH: Hamstrings: SLR to 90 deg  Thomas test: NT  POSTURE:  mild forward head, normal spinal alignment   PALPATION: Grossly TTP in upper mid and lower back, hips Patient met criteria for mild skin hyperextensibility, stretch marks, bilateral Pisa genic papules of the heel, history of dental crowding, and arachnodactyly.   Beighton Scale Lumbar (1_/1) Knees (_1/2) Elbows (2_/2)  5th digit (1/_2) Thumb (_2/2)  7/9   LOWER EXTREMITY ROM:  Active ROM Right eval Left eval  Hip flexion WNL WNL  Hip extension    Hip abduction    Hip adduction    Hip internal rotation hyper hyper  Hip external rotation Relatively tight but [employer] [facility]  Knee flexion [facility] WFL  Knee extension hyper WFL  Ankle dorsiflexion    Ankle plantarflexion    Ankle inversion    Ankle eversion     (Blank rows = not tested)  LOWER EXTREMITY MMT:  MMT Right eval Left eval  Hip flexion 5 5  Hip extension 5 5  Hip abduction 4 4  Hip adduction    Hip internal rotation    Hip external rotation    Knee flexion 5 5  Knee extension 5 5   Ankle dorsiflexion 5 5  Ankle plantarflexion    Ankle inversion    Ankle eversion     (Blank rows = not tested)  LOWER EXTREMITY SPECIAL TESTS:  NT  FUNCTIONAL TESTS:      SLS WNL with focus Good squat with min cues, good depth, min hip discomfort   GAIT: Distance walked: 150 Assistive device utilized: None Level of assistance: Complete Independence Comments: no deviations     TODAY'S TREATMENT: PT eval and intro to core,  HEP and stabilization    PATIENT EDUCATION:  Education details: HEP, POC, neutral spine, pilates, proprioception Person educated: Patient Education method: Explanation, Demonstration, Tactile cues, Verbal cues, and Handouts Education comprehension: verbalized understanding and needs further education   HOME EXERCISE PROGRAM: Access Code: [email or web address] URL: [email or web address] Date: 07/14/2022 Prepared by: Raeford Razor  Exercises - Hooklying Transversus Abdominis Palpation  - 2 x daily - 7 x weekly - 2 sets - 10 reps - 10 hold - Supine Transversus Abdominis Bracing with Heel Slide  - 2 x daily - 7 x weekly - 2 sets - 10 reps - Supine Transversus Abdominis Bracing with Double Leg Fallout  - 2 x daily - 7 x weekly - 2 sets - 10 reps - Supine 90/90 Abdominal Bracing  - 2 x daily - 7 x weekly - 1 sets - 3-5 reps - 30 hold  ASSESSMENT:  CLINICAL IMPRESSION: Patient is a 36  y.o. female  who was seen today for physical therapy evaluation and treatment for hypermobility with symptoms suggestive of Ehlers Danlos type III.    OBJECTIVE IMPAIRMENTS decreased activity tolerance, difficulty walking, decreased strength, dizziness, increased fascial restrictions, improper body mechanics, and pain.   ACTIVITY LIMITATIONS carrying, lifting, sitting, standing, squatting, and sleeping  PARTICIPATION LIMITATIONS: interpersonal relationship, shopping, community activity, occupation, and recreation  PERSONAL FACTORS Time since onset of  injury/illness/exacerbation and 3+ comorbidities: migraines, POTS, hip surgery  are also affecting patient's functional outcome.   REHAB POTENTIAL: Excellent  CLINICAL DECISION MAKING: Evolving/moderate complexity  EVALUATION COMPLEXITY: Moderate   GOALS: Goals reviewed with patient? Yes   LONG TERM GOALS: Target date: 08/25/2022   Patient will be I with HEP upon discharge for core, hips, posture Baseline:  Goal status: INITIAL  2.  Pt will be able to demonstrate optimal posture and body mechanics for basic lifting, ADLs to minimize joint strain.  Baseline:  Goal status: INITIAL  3.  Pt will be able to report greater stability in knees, hips with walking longer distances in the community  Baseline:  Goal status: INITIAL  4.  Pt will be able to tolerate therapy session with other ADLs, usual work with no more than min increase in fatigue Baseline:  Goal status: INITIAL  5.  Patient will use tape or other external support as needed for joint stability when needed for more demanding tasks  Baseline:  Goal status: INITIAL  6.  Bristol Impact Scale score TBD Baseline:  Goal status: INITIAL   PLAN: PT FREQUENCY: 2x/week  PT DURATION: 6 weeks-8 weeks   PLANNED INTERVENTIONS: Therapeutic exercises, Therapeutic activity, Neuromuscular re-education, Balance training, Patient/Family education, Self Care, Joint mobilization, Dry Needling, Taping, Manual therapy, and Re-evaluation  PLAN FOR NEXT SESSION: check HEP. Add on to stab. Q-ped. Modify for UE pain. Try pilates footwork    Calla Wedekind, PT 07/14/2022, 5:04 PM     Raeford Razor, PT 07/14/22 5:13 PM Phone: [phone] Fax: [phone]

## 2022-07-14 ENCOUNTER — Ambulatory Visit: Payer: BC Managed Care – PPO | Attending: Physician Assistant | Admitting: Physical Therapy

## 2022-07-14 ENCOUNTER — Encounter: Payer: Self-pay | Admitting: Physical Therapy

## 2022-07-14 DIAGNOSIS — M62838 Other muscle spasm: Secondary | ICD-10-CM | POA: Insufficient documentation

## 2022-07-14 DIAGNOSIS — M357 Hypermobility syndrome: Secondary | ICD-10-CM | POA: Insufficient documentation

## 2022-07-14 DIAGNOSIS — M6281 Muscle weakness (generalized): Secondary | ICD-10-CM | POA: Insufficient documentation

## 2022-07-14 DIAGNOSIS — Q7962 Hypermobile Ehlers-Danlos syndrome: Secondary | ICD-10-CM | POA: Diagnosis not present

## 2022-07-14 DIAGNOSIS — Q796 Ehlers-Danlos syndrome, unspecified: Secondary | ICD-10-CM | POA: Diagnosis not present

## 2022-07-14 DIAGNOSIS — R279 Unspecified lack of coordination: Secondary | ICD-10-CM | POA: Diagnosis not present

## 2022-07-14 DIAGNOSIS — R293 Abnormal posture: Secondary | ICD-10-CM | POA: Diagnosis not present

## 2022-07-14 DIAGNOSIS — M2550 Pain in unspecified joint: Secondary | ICD-10-CM | POA: Diagnosis not present

## 2022-07-16 DIAGNOSIS — F321 Major depressive disorder, single episode, moderate: Secondary | ICD-10-CM | POA: Diagnosis not present

## 2022-07-16 DIAGNOSIS — F411 Generalized anxiety disorder: Secondary | ICD-10-CM | POA: Diagnosis not present

## 2022-07-21 ENCOUNTER — Ambulatory Visit: Payer: BC Managed Care – PPO | Admitting: Physical Therapy

## 2022-07-21 ENCOUNTER — Encounter: Payer: Self-pay | Admitting: Physical Therapy

## 2022-07-21 DIAGNOSIS — M6281 Muscle weakness (generalized): Secondary | ICD-10-CM

## 2022-07-21 DIAGNOSIS — R279 Unspecified lack of coordination: Secondary | ICD-10-CM | POA: Diagnosis not present

## 2022-07-21 DIAGNOSIS — M357 Hypermobility syndrome: Secondary | ICD-10-CM | POA: Diagnosis not present

## 2022-07-21 DIAGNOSIS — Q7962 Hypermobile Ehlers-Danlos syndrome: Secondary | ICD-10-CM

## 2022-07-21 DIAGNOSIS — R293 Abnormal posture: Secondary | ICD-10-CM | POA: Diagnosis not present

## 2022-07-21 DIAGNOSIS — Q796 Ehlers-Danlos syndrome, unspecified: Secondary | ICD-10-CM | POA: Diagnosis not present

## 2022-07-21 DIAGNOSIS — M2550 Pain in unspecified joint: Secondary | ICD-10-CM | POA: Diagnosis not present

## 2022-07-21 DIAGNOSIS — M62838 Other muscle spasm: Secondary | ICD-10-CM | POA: Diagnosis not present

## 2022-07-21 NOTE — Therapy (Addendum)
OUTPATIENT PHYSICAL THERAPY TREATMENT NOTE   Patient Name: Kathryn Park MRN: [id number] DOB:02-18-1986, 36 y.o., female Today's Date: 07/21/2022  PCP: Kathryn Coke, PA   REFERRING PROVIDER: [name], PA Dr. Lynne Park  END OF SESSION:   PT End of Session - 07/21/22 0858     Visit Number 2    Number of Visits 12    Date for PT Re-Evaluation 09/08/22    Authorization Type BCBS    PT Start Time [phone]   pt late   PT Stop Time 0930    PT Time Calculation (min) 33 min             Past Medical History:  Diagnosis Date   Abnormal Pap smear of cervix    Allergy    Anemia    suspected poor diet; vegetarian during teens   Anxiety    Asthma    childhood   Depression    Dysmenorrhea    Family history of breast cancer    Family history of kidney cancer    Granulosa cell tumor of ovary    History of iron deficiency anemia    age 38   Joint pain    Labral tear of right hip joint    Leukocytosis    Low vitamin D level    Migraine with aura    never on rx or saw neurology   Ovarian cancer ([place]) 2021   Ovarian cyst    Left   Pneumonia 01/2012    left lower lobe   Pre-diabetes 2015   had a "borderline" A1c   STD (sexually transmitted disease)    HSV1   Wears contact lenses    Wears glasses    Past Surgical History:  Procedure Laterality Date   BREAST BIOPSY Right 04/2020   benign   COLPOSCOPY     LAPAROSCOPIC OVARIAN CYSTECTOMY Left 11/13/2019   Procedure: LAPAROSCOPIC OVARIAN CYSTECTOMY/POSSIBLE LEFT OOPHORECTOMY/ COLLECTION OF PELVIC WASHINGS/ks;  Surgeon: Kathryn Cobbs, MD;  Location: [facility];  Service: Gynecology;  Laterality: Left;  Possible left oophorectomy Colection of pelvic washings To follow first case of the day   LEFT OOPHORECTOMY Left 11/2019   right hip arthroscopy  01/2022   for laberal tear repair   TONSILLECTOMY     UPPER GI ENDOSCOPY     Patient Active Problem List   Diagnosis  Date Noted   EDS (Ehlers-Park syndrome) 06/10/2022   Tear of right acetabular labrum 07/22/2021   Breast lump 02/12/2021   Bilateral hip pain 11/25/2020   Positive ANA (antinuclear antibody) 11/25/2020   Other fatigue 11/25/2020   Bladder retention 10/13/2020   HSV-1 (herpes simplex virus 1) infection 10/13/2020   History of anorexia nervosa 10/13/2020   GAD (generalized anxiety disorder) 10/13/2020   Family history of breast cancer    Family history of kidney cancer    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    REFERRING DIAG: Kathryn Park syndrome    THERAPY DIAG:  Ehlers-Park syndrome type III  Hypermobility syndrome  Muscle weakness (generalized)  Rationale for Evaluation and Treatment Rehabilitation  PERTINENT HISTORY: Rt hip labral repair Ovarian cancer POTS Migraines   surgery on by Dr. Aretha Park on 01/21/22.  PRECAUTIONS: Other: Ligament laxity/joint instability , POTS, monitor symptoms    SUBJECTIVE:  SUBJECTIVE STATEMENT:  I used the pilates ball under my pelvis at home. I am having a hard time turning my muscles on this morning. I get a lot of tension in my neck in shoulders during the exercises. I used a donut pillow under my head which helped.      PAIN:  Are you having pain? Yes: NPRS scale: not much today /10 Pain location: Rt hip at times  Pain description: sore Aggravating factors: hip- prolonged posiitoning  Relieving factors: heat, rest, yoga ball , movement    OBJECTIVE: (objective measures completed at initial evaluation unless otherwise dated)   DIAGNOSTIC FINDINGS: NONE    PATIENT SURVEYS:  BIS survey given on eval    COGNITION:            Overall cognitive status: Within functional limits for tasks assessed                          SENSATION: WFL    EDEMA:  NA   MUSCLE LENGTH: Hamstrings: SLR to 90 deg  Thomas test: NT   POSTURE:  mild forward head, normal spinal alignment    PALPATION: Grossly TTP in upper mid and lower back, hips Patient met criteria for mild skin hyperextensibility, stretch marks, bilateral Pisa genic papules of the heel, history of dental crowding, and arachnodactyly.   Beighton Scale Lumbar (1_/1) Knees (_1/2) Elbows (2_/2)  5th digit (1/_2) Thumb (_2/2)   7/9     LOWER EXTREMITY ROM:   Active ROM Right eval Left eval  Hip flexion WNL WNL  Hip extension      Hip abduction      Hip adduction      Hip internal rotation hyper hyper  Hip external rotation Relatively tight but [facility]  Knee flexion [facility] WFL  Knee extension hyper WFL  Ankle dorsiflexion      Ankle plantarflexion      Ankle inversion      Ankle eversion       (Blank rows = not tested)   LOWER EXTREMITY MMT:   MMT Right eval Left eval  Hip flexion 5 5  Hip extension 5 5  Hip abduction 4 4  Hip adduction      Hip internal rotation      Hip external rotation      Knee flexion 5 5  Knee extension 5 5  Ankle dorsiflexion 5 5  Ankle plantarflexion      Ankle inversion      Ankle eversion       (Blank rows = not tested)   LOWER EXTREMITY SPECIAL TESTS:  NT   FUNCTIONAL TESTS:                            SLS WNL with focus Good squat with min cues, good depth, min hip discomfort    GAIT: Distance walked: 150 Assistive device utilized: None Level of assistance: Complete Independence Comments: no deviations    [facility] Adult PT Treatment:                                                DATE: 07/21/22 Therapeutic Exercise: TrA activation with palpation- breathing- difficulty relaxing shoulders/tension in neck-better with 2 pillows.  TrA with heel slides alternating - right hip ,  left knee popping TrA with bent knee fallouts bilat and alternating- right hip pain- able to add resistance band with min increased pain Tra  with articulating bridge- pt reports difficulty with activation and experienced muscle spasms on right side Qped over exercise bal to off load UEs- hip ext alternating x 10- increased wrist pain Plank on knees and forearms- 15 sec x 3 - pain in left shoulder with coming down from plank.  No additions to HEP   TODAY'S TREATMENT: PT eval and intro to core, HEP and stabilization      PATIENT EDUCATION:  Education details: HEP, POC, neutral spine, pilates, proprioception Person educated: Patient Education method: Explanation, Demonstration, Tactile cues, Verbal cues, and Handouts Education comprehension: verbalized understanding and needs further education     HOME EXERCISE PROGRAM: Access Code: [email or web address] URL: [email or web address] Date: 07/14/2022 Prepared by: Raeford Razor   Exercises - Hooklying Transversus Abdominis Palpation  - 2 x daily - 7 x weekly - 2 sets - 10 reps - 10 hold - Supine Transversus Abdominis Bracing with Heel Slide  - 2 x daily - 7 x weekly - 2 sets - 10 reps - Supine Transversus Abdominis Bracing with Double Leg Fallout  - 2 x daily - 7 x weekly - 2 sets - 10 reps - Supine 90/90 Abdominal Bracing  - 2 x daily - 7 x weekly - 1 sets - 3-5 reps - 30 hold   ASSESSMENT:   CLINICAL IMPRESSION: Patient is a 36  y.o. female  who was seen today for physical therapy  treatment for hypermobility with symptoms suggestive of Ehlers Park type III. Pt reports compliance with HEP but is having increased tension in neck and shoulders during the exercises. She has tried different support options and felt some relief using a donut shaped pillow. Today 2 pillows were most comfortable for HEP review. She experienced popping in right hip with HEP today and was encouraged to keep ROM comfortable. Able to add resistance band to bent knee fall outs with progressive increased in hip pain. Began Bridge with pt experiencing increased muscle spasms on right side. Attempted Qped on  Ball to off load the weight on her wrists. Did well with hip extensions in this position with min cues for alignment, but began having wrist pain so discontinued. She did well with forearm planks on knees however experience pain in left arm with relaxing into prone. Options were discussed for how to come down from plank without exacerbating pain in shoulders. No additions were formally added to HEP today due to increased pain with all movements today.      OBJECTIVE IMPAIRMENTS decreased activity tolerance, difficulty walking, decreased strength, dizziness, increased fascial restrictions, improper body mechanics, and pain.    ACTIVITY LIMITATIONS carrying, lifting, sitting, standing, squatting, and sleeping   PARTICIPATION LIMITATIONS: interpersonal relationship, shopping, community activity, occupation, and recreation   PERSONAL FACTORS Time since onset of injury/illness/exacerbation and 3+ comorbidities: migraines, POTS, hip surgery  are also affecting patient's functional outcome.    REHAB POTENTIAL: Excellent   CLINICAL DECISION MAKING: Evolving/moderate complexity   EVALUATION COMPLEXITY: Moderate     GOALS: Goals reviewed with patient? Yes     LONG TERM GOALS: Target date: 08/25/2022    Patient will be I with HEP upon discharge for core, hips, posture Baseline:  Goal status: INITIAL   2.  Pt will be able to demonstrate optimal posture and body mechanics for basic lifting, ADLs to minimize joint strain.  Baseline:  Goal status:  INITIAL   3.  Pt will be able to report greater stability in knees, hips with walking longer distances in the community  Baseline:  Goal status: INITIAL   4.  Pt will be able to tolerate therapy session with other ADLs, usual work with no more than min increase in fatigue Baseline:  Goal status: INITIAL   5.  Patient will use tape or other external support as needed for joint stability when needed for more demanding tasks  Baseline:  Goal status:  INITIAL   6.  Bristol Impact Scale score TBD Baseline:  Goal status: INITIAL     PLAN: PT FREQUENCY: 2x/week   PT DURATION: 6 weeks-8 weeks    PLANNED INTERVENTIONS: Therapeutic exercises, Therapeutic activity, Neuromuscular re-education, Balance training, Patient/Family education, Self Care, Joint mobilization, Dry Needling, Taping, Manual therapy, and Re-evaluation   PLAN FOR NEXT SESSION: check HEP. Add on to stab. Q-ped. Modify for UE pain. Try pilates footwork         Hessie Diener, PTA 07/21/22 9:40 AM Phone: [phone] Fax: [phone]

## 2022-07-22 ENCOUNTER — Ambulatory Visit: Payer: BC Managed Care – PPO | Admitting: Physical Therapy

## 2022-07-22 DIAGNOSIS — Q796 Ehlers-Danlos syndrome, unspecified: Secondary | ICD-10-CM | POA: Diagnosis not present

## 2022-07-22 DIAGNOSIS — M357 Hypermobility syndrome: Secondary | ICD-10-CM | POA: Diagnosis not present

## 2022-07-22 DIAGNOSIS — R293 Abnormal posture: Secondary | ICD-10-CM

## 2022-07-22 DIAGNOSIS — Q7962 Hypermobile Ehlers-Danlos syndrome: Secondary | ICD-10-CM | POA: Diagnosis not present

## 2022-07-22 DIAGNOSIS — M6281 Muscle weakness (generalized): Secondary | ICD-10-CM | POA: Diagnosis not present

## 2022-07-22 DIAGNOSIS — R279 Unspecified lack of coordination: Secondary | ICD-10-CM | POA: Diagnosis not present

## 2022-07-22 DIAGNOSIS — M62838 Other muscle spasm: Secondary | ICD-10-CM | POA: Diagnosis not present

## 2022-07-22 DIAGNOSIS — M2550 Pain in unspecified joint: Secondary | ICD-10-CM | POA: Diagnosis not present

## 2022-07-22 NOTE — Therapy (Signed)
OUTPATIENT PHYSICAL THERAPY TREATMENT NOTE   Patient Name: Kathryn Park MRN: [id number] DOB:1986/07/15, 36 y.o., female Today's Date: 07/23/2022  PCP: [name], [place]   REFERRING PROVIDER: [name], [place] Dr. [name]  END OF SESSION:   PT End of Session - 07/23/22 0809     Visit Number 3    Number of Visits 12    Date for PT Re-Evaluation 09/29/22    Authorization Type BCBS    PT Start Time 0805    PT Stop Time 0845    PT Time Calculation (min) 40 min    Activity Tolerance Patient tolerated treatment well    Behavior During Therapy WFL for tasks assessed/performed              Past Medical History:  Diagnosis Date   Abnormal Pap smear of cervix    Allergy    Anemia    suspected poor diet; vegetarian during teens   Anxiety    Asthma    childhood   Depression    Dysmenorrhea    Family history of breast cancer    Family history of kidney cancer    Granulosa cell tumor of ovary    History of iron deficiency anemia    age 1   Joint pain    Labral tear of right hip joint    Leukocytosis    Low vitamin D level    Migraine with aura    never on rx or saw neurology   Ovarian cancer ([place]) 2021   Ovarian cyst    Left   Pneumonia 01/2012    left lower lobe   Pre-diabetes 2015   had a "borderline" A1c   STD (sexually transmitted disease)    HSV1   Wears contact lenses    Wears glasses    Past Surgical History:  Procedure Laterality Date   BREAST BIOPSY Right 04/2020   benign   COLPOSCOPY     LAPAROSCOPIC OVARIAN CYSTECTOMY Left 11/13/2019   Procedure: LAPAROSCOPIC OVARIAN CYSTECTOMY/POSSIBLE LEFT OOPHORECTOMY/ COLLECTION OF PELVIC WASHINGS/ks;  Surgeon: Nunzio Cobbs, MD;  Location: [facility];  Service: Gynecology;  Laterality: Left;  Possible left oophorectomy Colection of pelvic washings To follow first case of the day   LEFT OOPHORECTOMY Left 11/2019   right hip arthroscopy  01/2022   for  laberal tear repair   TONSILLECTOMY     UPPER GI ENDOSCOPY     Patient Active Problem List   Diagnosis Date Noted   EDS (Ehlers-Danlos syndrome) 06/10/2022   Tear of right acetabular labrum 07/22/2021   Breast lump 02/12/2021   Bilateral hip pain 11/25/2020   Positive ANA (antinuclear antibody) 11/25/2020   Other fatigue 11/25/2020   Bladder retention 10/13/2020   HSV-1 (herpes simplex virus 1) infection 10/13/2020   History of anorexia nervosa 10/13/2020   GAD (generalized anxiety disorder) 10/13/2020   Family history of breast cancer    Family history of kidney cancer    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    REFERRING DIAG: [name] syndrome    THERAPY DIAG:  Muscle weakness (generalized)  Unspecified lack of coordination  Abnormal posture  Ehlers-Danlos syndrome type III  Hypermobility syndrome  Polyarthralgia  Other muscle spasm  Rationale for Evaluation and Treatment Rehabilitation  PERTINENT HISTORY: Rt hip labral repair Ovarian cancer POTS Migraines   surgery on by Dr. Aretha Parrot on 01/21/22.  PRECAUTIONS: Other: Ligament laxity/joint instability , POTS, monitor symptoms  SUBJECTIVE:                                                                                                                                                                                      SUBJECTIVE STATEMENT: I want to go down to 1 x per week because I need time to work on things between visits.  Pt walks in with cane today, feels "wobbly".  She is having twitching in her trunk and Rt knee today.      PAIN:  Are you having pain? Yes: NPRS scale: no rated  /10 Pain location: hands, fingers mostly Pain description: sore Aggravating factors: hip- prolonged posiitoning  Relieving factors: heat, rest, yoga ball , movement    OBJECTIVE: (objective measures completed at initial evaluation unless otherwise dated)   DIAGNOSTIC FINDINGS: NONE    PATIENT SURVEYS:   BIS survey given on eval    COGNITION:            Overall cognitive status: Within functional limits for tasks assessed                          SENSATION: WFL   EDEMA:  NA   MUSCLE LENGTH: Hamstrings: SLR to 90 deg  Thomas test: NT   POSTURE:  mild forward head, normal spinal alignment    PALPATION: Grossly TTP in upper mid and lower back, hips Patient met criteria for mild skin hyperextensibility, stretch marks, bilateral Pisa genic papules of the heel, history of dental crowding, and arachnodactyly.   Beighton Scale Lumbar (1_/1) Knees (_1/2) Elbows (2_/2)  5th digit (1/_2) Thumb (_2/2)   7/9     LOWER EXTREMITY ROM:   Active ROM Right eval Left eval  Hip flexion WNL WNL  Hip extension      Hip abduction      Hip adduction      Hip internal rotation hyper hyper  Hip external rotation Relatively tight but [facility]  Knee flexion [facility]  Knee extension hyper WFL  Ankle dorsiflexion      Ankle plantarflexion      Ankle inversion      Ankle eversion       (Blank rows = not tested)   LOWER EXTREMITY MMT:   MMT Right eval Left eval  Hip flexion 5 5  Hip extension 5 5  Hip abduction 4 4  Hip adduction      Hip internal rotation      Hip external rotation      Knee flexion 5 5  Knee extension 5 5  Ankle dorsiflexion 5 5  Ankle plantarflexion  Ankle inversion      Ankle eversion       (Blank rows = not tested)   LOWER EXTREMITY SPECIAL TESTS:  NT   FUNCTIONAL TESTS:                            SLS WNL with focus Good squat with min cues, good depth, min hip discomfort    GAIT: Distance walked: 150 Assistive device utilized: None Level of assistance: Complete Independence Comments: no deviations     [facility] Adult PT Treatment:                                                DATE: 07/23/22 Therapeutic Exercise: Supine Tr A activation with ball squeeze , multiple positions and variations (prop on forearm, ball under sacrum)  Supine march  with pelvic stability  Knee extension x 8 each side  Clam double leg fallout x 10  Pilates Reformer used for LE/core strength, postural strength, lumbopelvic disassociation and core control.  Exercises included: Footwork 2 red springs  Double leg on heels, toes in parallel and toes  Narrow and wide positions Single leg press out 1 red 1 blue with opp leg in table top with ball behind knee   [facility] Adult PT Treatment:                                                DATE: 07/21/22 Therapeutic Exercise: TrA activation with palpation- breathing- difficulty relaxing shoulders/tension in neck-better with 2 pillows.  TrA with heel slides alternating - right hip , left knee popping TrA with bent knee fallouts bilat and alternating- right hip pain- able to add resistance band with min increased pain Tra with articulating bridge- pt reports difficulty with activation and experienced muscle spasms on right side Qped over exercise bal to off load UEs- hip ext alternating x 10- increased wrist pain Plank on knees and forearms- 15 sec x 3 - pain in left shoulder with coming down from plank.  No additions to HEP   TODAY'S TREATMENT: PT eval and intro to core, HEP and stabilization      PATIENT EDUCATION:  Education details: HEP, POC, neutral spine, pilates, proprioception Person educated: Patient Education method: Explanation, Demonstration, Tactile cues, Verbal cues, and Handouts Education comprehension: verbalized understanding and needs further education     HOME EXERCISE PROGRAM: Access Code: [email or web address] URL: [email or web address] Date: 07/14/2022 Prepared by: Raeford Razor   Exercises - Hooklying Transversus Abdominis Palpation  - 2 x daily - 7 x weekly - 2 sets - 10 reps - 10 hold - Supine Transversus Abdominis Bracing with Heel Slide  - 2 x daily - 7 x weekly - 2 sets - 10 reps - Supine Transversus Abdominis Bracing with Double Leg Fallout  - 2 x daily - 7 x weekly - 2 sets -  10 reps - Supine 90/90 Abdominal Bracing  - 2 x daily - 7 x weekly - 1 sets - 3-5 reps - 30 hold   ASSESSMENT:   CLINICAL IMPRESSION: Patient initially struggling to perform her stabilization exercises due to twitching and tension in neck and shoulders.  She was able to initiate Pilates Reformer  exercises with modified spring tension with good pelvic control and alignment. When she was able to focus on her LE alignment she naturally was able to maintain pelvic control without overthinking her muscle contractions. She anticipated Rt hip popping but was actually able to control it today. Cont POC x 1x per week.   OBJECTIVE IMPAIRMENTS decreased activity tolerance, difficulty walking, decreased strength, dizziness, increased fascial restrictions, improper body mechanics, and pain.    ACTIVITY LIMITATIONS carrying, lifting, sitting, standing, squatting, and sleeping   PARTICIPATION LIMITATIONS: interpersonal relationship, shopping, community activity, occupation, and recreation   PERSONAL FACTORS Time since onset of injury/illness/exacerbation and 3+ comorbidities: migraines, POTS, hip surgery  are also affecting patient's functional outcome.    REHAB POTENTIAL: Excellent   CLINICAL DECISION MAKING: Evolving/moderate complexity   EVALUATION COMPLEXITY: Moderate     GOALS: Goals reviewed with patient? Yes     LONG TERM GOALS: Target date: 08/25/2022    Patient will be I with HEP upon discharge for core, hips, posture Baseline:  Goal status: INITIAL   2.  Pt will be able to demonstrate optimal posture and body mechanics for basic lifting, ADLs to minimize joint strain.  Baseline:  Goal status: INITIAL   3.  Pt will be able to report greater stability in knees, hips with walking longer distances in the community  Baseline:  Goal status: INITIAL   4.  Pt will be able to tolerate therapy session with other ADLs, usual work with no more than min increase in fatigue Baseline:  Goal  status: INITIAL   5.  Patient will use tape or other external support as needed for joint stability when needed for more demanding tasks  Baseline:  Goal status: INITIAL   6.  Bristol Impact Scale score TBD Baseline:  Goal status: INITIAL     PLAN: PT FREQUENCY: 2x/week   PT DURATION: 6 weeks-8 weeks    PLANNED INTERVENTIONS: Therapeutic exercises, Therapeutic activity, Neuromuscular re-education, Balance training, Patient/Family education, Self Care, Joint mobilization, Dry Needling, Taping, Manual therapy, and Re-evaluation   PLAN FOR NEXT SESSION: check HEP. Add on to stab. Q-ped, Modify for UE pain. Try pilates footwork       Raeford Razor, PT 07/23/22 8:47 AM Phone: [phone] Fax: [phone]
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
## 2022-07-23 ENCOUNTER — Ambulatory Visit: Payer: BC Managed Care – PPO | Admitting: Physical Therapy

## 2022-07-23 ENCOUNTER — Encounter: Payer: Self-pay | Admitting: Physical Therapy

## 2022-07-23 DIAGNOSIS — R293 Abnormal posture: Secondary | ICD-10-CM

## 2022-07-23 DIAGNOSIS — Q7962 Hypermobile Ehlers-Danlos syndrome: Secondary | ICD-10-CM | POA: Diagnosis not present

## 2022-07-23 DIAGNOSIS — M62838 Other muscle spasm: Secondary | ICD-10-CM | POA: Diagnosis not present

## 2022-07-23 DIAGNOSIS — F321 Major depressive disorder, single episode, moderate: Secondary | ICD-10-CM | POA: Diagnosis not present

## 2022-07-23 DIAGNOSIS — M2550 Pain in unspecified joint: Secondary | ICD-10-CM | POA: Diagnosis not present

## 2022-07-23 DIAGNOSIS — M357 Hypermobility syndrome: Secondary | ICD-10-CM | POA: Diagnosis not present

## 2022-07-23 DIAGNOSIS — R279 Unspecified lack of coordination: Secondary | ICD-10-CM | POA: Diagnosis not present

## 2022-07-23 DIAGNOSIS — M6281 Muscle weakness (generalized): Secondary | ICD-10-CM

## 2022-07-23 DIAGNOSIS — Q796 Ehlers-Danlos syndrome, unspecified: Secondary | ICD-10-CM | POA: Diagnosis not present

## 2022-07-23 DIAGNOSIS — F411 Generalized anxiety disorder: Secondary | ICD-10-CM | POA: Diagnosis not present

## 2022-07-27 NOTE — Therapy (Unsigned)
OUTPATIENT PHYSICAL THERAPY TREATMENT NOTE   Patient Name: Kathryn Park MRN: [id number] DOB:08-30-1986, 36 y.o., female Today's Date: 07/28/2022  PCP: [name], [place]   REFERRING PROVIDER: [name], [place] Dr. [name]  END OF SESSION:   PT End of Session - 07/28/22 0851     Visit Number 4    Number of Visits 12    Date for PT Re-Evaluation 09/29/22    Authorization Type BCBS    PT Start Time 0850    PT Stop Time 0930    PT Time Calculation (min) 40 min    Activity Tolerance Patient tolerated treatment well    Behavior During Therapy WFL for tasks assessed/performed               Past Medical History:  Diagnosis Date   Abnormal Pap smear of cervix    Allergy    Anemia    suspected poor diet; vegetarian during teens   Anxiety    Asthma    childhood   Depression    Dysmenorrhea    Family history of breast cancer    Family history of kidney cancer    Granulosa cell tumor of ovary    History of iron deficiency anemia    age 19   Joint pain    Labral tear of right hip joint    Leukocytosis    Low vitamin D level    Migraine with aura    never on rx or saw neurology   Ovarian cancer ([place]) 2021   Ovarian cyst    Left   Pneumonia 01/2012    left lower lobe   Pre-diabetes 2015   had a "borderline" A1c   STD (sexually transmitted disease)    HSV1   Wears contact lenses    Wears glasses    Past Surgical History:  Procedure Laterality Date   BREAST BIOPSY Right 04/2020   benign   COLPOSCOPY     LAPAROSCOPIC OVARIAN CYSTECTOMY Left 11/13/2019   Procedure: LAPAROSCOPIC OVARIAN CYSTECTOMY/POSSIBLE LEFT OOPHORECTOMY/ COLLECTION OF PELVIC WASHINGS/ks;  Surgeon: Nunzio Cobbs, MD;  Location: [facility];  Service: Gynecology;  Laterality: Left;  Possible left oophorectomy Colection of pelvic washings To follow first case of the day   LEFT OOPHORECTOMY Left 11/2019   right hip arthroscopy  01/2022    for laberal tear repair   TONSILLECTOMY     UPPER GI ENDOSCOPY     Patient Active Problem List   Diagnosis Date Noted   EDS (Ehlers-Danlos syndrome) 06/10/2022   Tear of right acetabular labrum 07/22/2021   Breast lump 02/12/2021   Bilateral hip pain 11/25/2020   Positive ANA (antinuclear antibody) 11/25/2020   Other fatigue 11/25/2020   Bladder retention 10/13/2020   HSV-1 (herpes simplex virus 1) infection 10/13/2020   History of anorexia nervosa 10/13/2020   GAD (generalized anxiety disorder) 10/13/2020   Family history of breast cancer    Family history of kidney cancer    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    REFERRING DIAG: [name] syndrome    THERAPY DIAG:  Muscle weakness (generalized)  Unspecified lack of coordination  Abnormal posture  Ehlers-Danlos syndrome type III  Hypermobility syndrome  Polyarthralgia  Other muscle spasm  Rationale for Evaluation and Treatment Rehabilitation  PERTINENT HISTORY: Rt hip labral repair Ovarian cancer POTS Migraines   surgery on by Dr. Aretha Parrot on 01/21/22.  PRECAUTIONS: Other: Ligament laxity/joint instability , POTS, monitor symptoms  SUBJECTIVE:                                                                                                                                                                                      SUBJECTIVE STATEMENT: No negative effects after last session.  Pinchy in Rt hip.  Hands hurting.     PAIN:  Are you having pain? Yes: NPRS scale: no rated  /10 Pain location: hands, fingers mostly Pain description: sore Aggravating factors: hip- prolonged posiitoning  Relieving factors: heat, rest, yoga ball , movement    OBJECTIVE: (objective measures completed at initial evaluation unless otherwise dated)   DIAGNOSTIC FINDINGS: NONE    PATIENT SURVEYS:  BIS survey given on eval    COGNITION:            Overall cognitive status: Within functional limits for tasks  assessed                          SENSATION: WFL   EDEMA:  NA   MUSCLE LENGTH: Hamstrings: SLR to 90 deg  Thomas test: NT   POSTURE:  mild forward head, normal spinal alignment    PALPATION: Grossly TTP in upper mid and lower back, hips Patient met criteria for mild skin hyperextensibility, stretch marks, bilateral Pisa genic papules of the heel, history of dental crowding, and arachnodactyly.   Beighton Scale Lumbar (1_/1) Knees (_1/2) Elbows (2_/2)  5th digit (1/_2) Thumb (_2/2)   7/9     LOWER EXTREMITY ROM:   Active ROM Right eval Left eval  Hip flexion WNL WNL  Hip extension      Hip abduction      Hip adduction      Hip internal rotation hyper hyper  Hip external rotation Relatively tight but [facility]  Knee flexion [facility] WFL  Knee extension hyper WFL  Ankle dorsiflexion      Ankle plantarflexion      Ankle inversion      Ankle eversion       (Blank rows = not tested)   LOWER EXTREMITY MMT:   MMT Right eval Left eval  Hip flexion 5 5  Hip extension 5 5  Hip abduction 4 4  Hip adduction      Hip internal rotation      Hip external rotation      Knee flexion 5 5  Knee extension 5 5  Ankle dorsiflexion 5 5  Ankle plantarflexion      Ankle inversion      Ankle eversion       (Blank rows = not tested)   LOWER EXTREMITY SPECIAL TESTS:  NT   FUNCTIONAL TESTS:                            SLS WNL with focus Good squat with min cues, good depth, min hip discomfort    GAIT: Distance walked: 150 Assistive device utilized: None Level of assistance: Complete Independence Comments: no deviations    [facility] Adult PT Treatment:                                                DATE: 07/28/22 Therapeutic Exercise: Supine breathing, placed dynadisc under pelvis TrA with heel slides alternating progressed to 90/90 to knee extension  LTR between sets to reduce tension Isometric Pilates circle squeeze chest hgt  Added knee lift x 8 alternating side  TrA  with bent knee fallouts unilateral , without issues  Tra with articulating bridge, pt did well initially, coordination of breathing an issue  Sidelying clam x 15 no band Sidelying hip abduction with knee extension x 8, circles  Figure 4 after sets  Wall sit 30 sec x 3, added bilat. Shoulder movements with circle  Wall push ups elbows out to 45 and then triceps x 10 each   [facility] Adult PT Treatment:                                                DATE: 07/23/22 Therapeutic Exercise: Supine Tr A activation with ball squeeze , multiple positions and variations (prop on forearm, ball under sacrum)  Supine march with pelvic stability  Knee extension x 8 each side  Clam double leg fallout x 10  Pilates Reformer used for LE/core strength, postural strength, lumbopelvic disassociation and core control.  Exercises included: Footwork 2 red springs  Double leg on heels, toes in parallel and toes  Narrow and wide positions Single leg press out 1 red 1 blue with opp leg in table top with ball behind knee   [facility] Adult PT Treatment:                                                DATE: 07/21/22 Therapeutic Exercise: TrA activation with palpation- breathing- difficulty relaxing shoulders/tension in neck-better with 2 pillows.  TrA with heel slides alternating - right hip , left knee popping TrA with bent knee fallouts bilat and alternating- right hip pain- able to add resistance band with min increased pain Tra with articulating bridge- pt reports difficulty with activation and experienced muscle spasms on right side Qped over exercise bal to off load UEs- hip ext alternating x 10- increased wrist pain Plank on knees and forearms- 15 sec x 3 - pain in left shoulder with coming down from plank.  No additions to HEP   TODAY'S TREATMENT: PT eval and intro to core, HEP and stabilization      PATIENT EDUCATION:  Education details: HEP, POC, neutral spine, pilates, proprioception Person educated:  Patient Education method: Explanation, Demonstration, Tactile cues, Verbal cues, and Handouts Education comprehension: verbalized understanding and needs further education     HOME EXERCISE PROGRAM:  Access Code: [email or web address] URL: [email or web address] Date: 07/14/2022 Prepared by: Raeford Razor   Access Code: [email or web address] URL: [email or web address] Date: 07/28/2022 Prepared by: Raeford Razor  Exercises - Hooklying Transversus Abdominis Palpation  - 2 x daily - 7 x weekly - 2 sets - 10 reps - 10 hold - Supine 90/90 Abdominal Bracing  - 2 x daily - 7 x weekly - 1 sets - 3-5 reps - 30 hold - Bent Knee Fallouts  - 1 x daily - 7 x weekly - 2 sets - 10 reps - 30 hold - Supine Bridge with Mini Swiss Ball Between Knees  - 2 x daily - 7 x weekly - 2 sets - 10 reps - 5 hold - Clamshell  - 1 x daily - 7 x weekly - 2 sets - 10 reps - 30 hold - Wall Quarter Squat  - 1 x daily - 7 x weekly - 1 sets - 5 reps - 30-60 hold    ASSESSMENT:   CLINICAL IMPRESSION: Patient did well today on mat for stabilization exercises.  She can overthink some of the exercises at times, given breaks as needed.  Upper back and neck tension increased when upper body involved.  Pt. Coming 1 x per week now, given a slightly upgraded HEP to progress her.    OBJECTIVE IMPAIRMENTS decreased activity tolerance, difficulty walking, decreased strength, dizziness, increased fascial restrictions, improper body mechanics, and pain.    ACTIVITY LIMITATIONS carrying, lifting, sitting, standing, squatting, and sleeping   PARTICIPATION LIMITATIONS: interpersonal relationship, shopping, community activity, occupation, and recreation   PERSONAL FACTORS Time since onset of injury/illness/exacerbation and 3+ comorbidities: migraines, POTS, hip surgery  are also affecting patient's functional outcome.    REHAB POTENTIAL: Excellent   CLINICAL DECISION MAKING: Evolving/moderate complexity   EVALUATION COMPLEXITY:  Moderate     GOALS: Goals reviewed with patient? Yes     LONG TERM GOALS: Target date: 08/25/2022    Patient will be I with HEP upon discharge for core, hips, posture Baseline:  Goal status: INITIAL   2.  Pt will be able to demonstrate optimal posture and body mechanics for basic lifting, ADLs to minimize joint strain.  Baseline:  Goal status: INITIAL   3.  Pt will be able to report greater stability in knees, hips with walking longer distances in the community  Baseline:  Goal status: INITIAL   4.  Pt will be able to tolerate therapy session with other ADLs, usual work with no more than min increase in fatigue Baseline:  Goal status: INITIAL   5.  Patient will use tape or other external support as needed for joint stability when needed for more demanding tasks  Baseline:  Goal status: INITIAL   6.  Bristol Impact Scale score TBD Baseline:  Goal status: INITIAL     PLAN: PT FREQUENCY: 2x/week   PT DURATION: 6 weeks-8 weeks    PLANNED INTERVENTIONS: Therapeutic exercises, Therapeutic activity, Neuromuscular re-education, Balance training, Patient/Family education, Self Care, Joint mobilization, Dry Needling, Taping, Manual therapy, and Re-evaluation   PLAN FOR NEXT SESSION: check HEP. Add on to stab. Q-ped, Modify for UE pain. Try pilates footwork       Raeford Razor, PT 07/28/22 9:32 AM Phone: [phone] Fax: [phone]

## 2022-07-28 ENCOUNTER — Ambulatory Visit: Payer: BC Managed Care – PPO | Admitting: Physical Therapy

## 2022-07-28 ENCOUNTER — Encounter: Payer: Self-pay | Admitting: Physical Therapy

## 2022-07-28 DIAGNOSIS — R293 Abnormal posture: Secondary | ICD-10-CM | POA: Diagnosis not present

## 2022-07-28 DIAGNOSIS — Q796 Ehlers-Danlos syndrome, unspecified: Secondary | ICD-10-CM | POA: Diagnosis not present

## 2022-07-28 DIAGNOSIS — R279 Unspecified lack of coordination: Secondary | ICD-10-CM

## 2022-07-28 DIAGNOSIS — M357 Hypermobility syndrome: Secondary | ICD-10-CM | POA: Diagnosis not present

## 2022-07-28 DIAGNOSIS — M62838 Other muscle spasm: Secondary | ICD-10-CM

## 2022-07-28 DIAGNOSIS — M2550 Pain in unspecified joint: Secondary | ICD-10-CM

## 2022-07-28 DIAGNOSIS — M6281 Muscle weakness (generalized): Secondary | ICD-10-CM

## 2022-07-28 DIAGNOSIS — Q7962 Hypermobile Ehlers-Danlos syndrome: Secondary | ICD-10-CM | POA: Diagnosis not present

## 2022-07-30 ENCOUNTER — Encounter: Payer: BC Managed Care – PPO | Admitting: Physical Therapy

## 2022-07-30 DIAGNOSIS — F411 Generalized anxiety disorder: Secondary | ICD-10-CM | POA: Diagnosis not present

## 2022-07-30 DIAGNOSIS — F321 Major depressive disorder, single episode, moderate: Secondary | ICD-10-CM | POA: Diagnosis not present

## 2022-08-03 NOTE — Therapy (Unsigned)
OUTPATIENT PHYSICAL THERAPY TREATMENT NOTE   Patient Name: Kathryn Park MRN: [id number] DOB:08/26/1986, 36 y.o., female Today's Date: 08/04/2022  PCP: [name], [place]   REFERRING PROVIDER: [name], [place] Dr. [name]  END OF SESSION:   PT End of Session - 08/04/22 0850     Visit Number 5    Number of Visits 12    Date for PT Re-Evaluation 09/29/22    Authorization Type BCBS    PT Start Time 0848    PT Stop Time 0931    PT Time Calculation (min) 43 min    Activity Tolerance Patient tolerated treatment well    Behavior During Therapy WFL for tasks assessed/performed                Past Medical History:  Diagnosis Date   Abnormal Pap smear of cervix    Allergy    Anemia    suspected poor diet; vegetarian during teens   Anxiety    Asthma    childhood   Depression    Dysmenorrhea    Family history of breast cancer    Family history of kidney cancer    Granulosa cell tumor of ovary    History of iron deficiency anemia    age 45   Joint pain    Labral tear of right hip joint    Leukocytosis    Low vitamin D level    Migraine with aura    never on rx or saw neurology   Ovarian cancer ([place]) 2021   Ovarian cyst    Left   Pneumonia 01/2012    left lower lobe   Pre-diabetes 2015   had a "borderline" A1c   STD (sexually transmitted disease)    HSV1   Wears contact lenses    Wears glasses    Past Surgical History:  Procedure Laterality Date   BREAST BIOPSY Right 04/2020   benign   COLPOSCOPY     LAPAROSCOPIC OVARIAN CYSTECTOMY Left 11/13/2019   Procedure: LAPAROSCOPIC OVARIAN CYSTECTOMY/POSSIBLE LEFT OOPHORECTOMY/ COLLECTION OF PELVIC WASHINGS/ks;  Surgeon: Nunzio Cobbs, MD;  Location: [facility]  Service: Gynecology;  Laterality: Left;  Possible left oophorectomy Colection of pelvic washings To follow first case of the day   LEFT OOPHORECTOMY Left 11/2019   right hip arthroscopy  01/2022    for laberal tear repair   TONSILLECTOMY     UPPER GI ENDOSCOPY     Patient Active Problem List   Diagnosis Date Noted   EDS (Ehlers-Danlos syndrome) 06/10/2022   Tear of right acetabular labrum 07/22/2021   Breast lump 02/12/2021   Bilateral hip pain 11/25/2020   Positive ANA (antinuclear antibody) 11/25/2020   Other fatigue 11/25/2020   Bladder retention 10/13/2020   HSV-1 (herpes simplex virus 1) infection 10/13/2020   History of anorexia nervosa 10/13/2020   GAD (generalized anxiety disorder) 10/13/2020   Family history of breast cancer    Family history of kidney cancer    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    REFERRING DIAG: [name] syndrome    THERAPY DIAG:  Muscle weakness (generalized)  Unspecified lack of coordination  Abnormal posture  Ehlers-Danlos syndrome type III  Hypermobility syndrome  Polyarthralgia  Other muscle spasm  Rationale for Evaluation and Treatment Rehabilitation  PERTINENT HISTORY: Rt hip labral repair Ovarian cancer POTS Migraines   surgery on by Dr. Aretha Parrot on 01/21/22.  PRECAUTIONS: Other: Ligament laxity/joint instability , POTS, monitor symptoms  SUBJECTIVE STATEMENT: Not really hurting today.  Just feeling dizzy with standing and heavy in legs.  She has compression stocking and shapewear on.  After last session, piriformis was angry and had increased muscle pain .  It feels like she has more stability and activation now.     PAIN:  Are you having pain? Yes: NPRS scale: no rated  /10 Pain location: hands, fingers mostly Pain description: sore Aggravating factors: hip- prolonged posiitoning  Relieving factors: heat, rest, yoga ball , movement    OBJECTIVE: (objective measures completed at initial evaluation unless otherwise  dated)   DIAGNOSTIC FINDINGS: NONE    PATIENT SURVEYS:  BIS survey given on eval    COGNITION:            Overall cognitive status: Within functional limits for tasks assessed                          SENSATION: WFL   EDEMA:  NA   MUSCLE LENGTH: Hamstrings: SLR to 90 deg  Thomas test: NT   POSTURE:  mild forward head, normal spinal alignment    PALPATION: Grossly TTP in upper mid and lower back, hips Patient met criteria for mild skin hyperextensibility, stretch marks, bilateral Pisa genic papules of the heel, history of dental crowding, and arachnodactyly.   Beighton Scale Lumbar (1_/1) Knees (_1/2) Elbows (2_/2)  5th digit (1/_2) Thumb (_2/2)   7/9     LOWER EXTREMITY ROM:   Active ROM Right eval Left eval  Hip flexion WNL WNL  Hip extension      Hip abduction      Hip adduction      Hip internal rotation hyper hyper  Hip external rotation Relatively tight but [facility]  Knee flexion [facility] WFL  Knee extension hyper WFL  Ankle dorsiflexion      Ankle plantarflexion      Ankle inversion      Ankle eversion       (Blank rows = not tested)   LOWER EXTREMITY MMT:   MMT Right eval Left eval  Hip flexion 5 5  Hip extension 5 5  Hip abduction 4 4  Hip adduction      Hip internal rotation      Hip external rotation      Knee flexion 5 5  Knee extension 5 5  Ankle dorsiflexion 5 5  Ankle plantarflexion      Ankle inversion      Ankle eversion       (Blank rows = not tested)   LOWER EXTREMITY SPECIAL TESTS:  NT   FUNCTIONAL TESTS:                            SLS WNL with focus Good squat with min cues, good depth, min hip discomfort    GAIT: Distance walked: 150 Assistive device utilized: None Level of assistance: Complete Independence Comments: no deviations   [facility] Adult PT Treatment:                                                DATE: 08/04/22 Therapeutic Exercise: Recumbent HR 74 at rest, up to 95 with level 2 flat road for 5 min   Supine breathing, Tr A x 10  Isometric ball  squeeze x 10  Hold ball with adductors then with upper body: red band shoulder press, narrow grip overhead lift and ER about 10 each  TrA with heel slides x  10  TrA with bent knee fallouts unilateral 2 sets with ball under feel and then red band around thighs x 10  Tra with articulating bridge, red band  Sidelying clam x 10 x 2 with red band  Sidelying hip abduction with no band x 10 x 2  Modified quadruped UE lift x 5 added opposite hip extension x 5 (bird dog)   [facility] Adult PT Treatment:                                                DATE: 07/28/22 Therapeutic Exercise: Supine breathing, placed dynadisc under pelvis TrA with heel slides alternating progressed to 90/90 to knee extension  LTR between sets to reduce tension Isometric Pilates circle squeeze chest hgt  Added knee lift x 8 alternating side  TrA with bent knee fallouts unilateral , without issues  Tra with articulating bridge, pt did well initially, coordination of breathing an issue  Sidelying clam x 15 no band Sidelying hip abduction with knee extension x 8, circles  Figure 4 after sets  Wall sit 30 sec x 3, added bilat. Shoulder movements with circle  Wall push ups elbows out to 45 and then triceps x 10 each   [facility] Adult PT Treatment:                                                DATE: 07/23/22 Therapeutic Exercise: Supine Tr A activation with ball squeeze , multiple positions and variations (prop on forearm, ball under sacrum)  Supine march with pelvic stability  Knee extension x 8 each side  Clam double leg fallout x 10  Pilates Reformer used for LE/core strength, postural strength, lumbopelvic disassociation and core control.  Exercises included: Footwork 2 red springs  Double leg on heels, toes in parallel and toes  Narrow and wide positions Single leg press out 1 red 1 blue with opp leg in table top with ball behind knee   [facility] Adult PT Treatment:                                                 DATE: 07/21/22 Therapeutic Exercise: TrA activation with palpation- breathing- difficulty relaxing shoulders/tension in neck-better with 2 pillows.  TrA with heel slides alternating - right hip , left knee popping TrA with bent knee fallouts bilat and alternating- right hip pain- able to add resistance band with min increased pain Tra with articulating bridge- pt reports difficulty with activation and experienced muscle spasms on right side Qped over exercise bal to off load UEs- hip ext alternating x 10- increased wrist pain Plank on knees and forearms- 15 sec x 3 - pain in left shoulder with coming down from plank.  No additions to HEP   TODAY'S TREATMENT: PT eval and intro to core, HEP and stabilization      PATIENT EDUCATION:  Education details: HEP, POC, neutral spine,  pilates, proprioception Person educated: Patient Education method: Explanation, Demonstration, Tactile cues, Verbal cues, and Handouts Education comprehension: verbalized understanding and needs further education     HOME EXERCISE PROGRAM:   Access Code: [email or web address] URL: [email or web address] Date: 07/28/2022 Prepared by: Raeford Razor  Exercises - Hooklying Transversus Abdominis Palpation  - 2 x daily - 7 x weekly - 2 sets - 10 reps - 10 hold - Supine 90/90 Abdominal Bracing  - 2 x daily - 7 x weekly - 1 sets - 3-5 reps - 30 hold - Bent Knee Fallouts  - 1 x daily - 7 x weekly - 2 sets - 10 reps - 30 hold - Supine Bridge with Mini Swiss Ball Between Knees  - 2 x daily - 7 x weekly - 2 sets - 10 reps - 5 hold - Clamshell  - 1 x daily - 7 x weekly - 2 sets - 10 reps - 30 hold - Wall Quarter Squat  - 1 x daily - 7 x weekly - 1 sets - 5 reps - 30-60 hold    ASSESSMENT:   CLINICAL IMPRESSION: Patient able to perform mat level exercises .  Rt piriformis irritable after last session continued to work on similar exercises to see if she can build on the stability and  strengthen she is feeling. She has noticed a relationship to her dysautonomia symptoms with her cycle as well.  Progressed to quadruped with grip on edge of mat to ease wrist discomfort. Form is excellent but needs intermittent rest breaks.   OBJECTIVE IMPAIRMENTS decreased activity tolerance, difficulty walking, decreased strength, dizziness, increased fascial restrictions, improper body mechanics, and pain.    ACTIVITY LIMITATIONS carrying, lifting, sitting, standing, squatting, and sleeping   PARTICIPATION LIMITATIONS: interpersonal relationship, shopping, community activity, occupation, and recreation   PERSONAL FACTORS Time since onset of injury/illness/exacerbation and 3+ comorbidities: migraines, POTS, hip surgery  are also affecting patient's functional outcome.    REHAB POTENTIAL: Excellent   CLINICAL DECISION MAKING: Evolving/moderate complexity   EVALUATION COMPLEXITY: Moderate     GOALS: Goals reviewed with patient? Yes     LONG TERM GOALS: Target date: 08/25/2022    Patient will be I with HEP upon discharge for core, hips, posture Baseline:  Goal status: ongoing    2.  Pt will be able to demonstrate optimal posture and body mechanics for basic lifting, ADLs to minimize joint strain.  Baseline:  Goal status: ongoing    3.  Pt will be able to report greater stability in knees, hips with walking longer distances in the community  Baseline:  Goal status: ongoing    4.  Pt will be able to tolerate therapy session with other ADLs, usual work with no more than min increase in fatigue Baseline: still works through the fatigue Goal status: ongoing   5.  Patient will use tape or other external support as needed for joint stability when needed for more demanding tasks  Baseline: compression, has used tape for her hip, knee  Goal status: ongoing    6.  Bristol Impact Scale score TBD Baseline:  Goal status: INITIAL     PLAN: PT FREQUENCY: 2x/week   PT DURATION: 6  weeks-8 weeks    PLANNED INTERVENTIONS: Therapeutic exercises, Therapeutic activity, Neuromuscular re-education, Balance training, Patient/Family education, Self Care, Joint mobilization, Dry Needling, Taping, Manual therapy, and Re-evaluation   PLAN FOR NEXT SESSION: check HEP. Add on to stab. Q-ped, Modify for UE pain. Try pilates footwork  Raeford Razor, PT 08/04/22 9:35 AM Phone: [phone] Fax: [phone]

## 2022-08-04 ENCOUNTER — Ambulatory Visit: Payer: BC Managed Care – PPO | Attending: Physician Assistant | Admitting: Physical Therapy

## 2022-08-04 ENCOUNTER — Encounter: Payer: BC Managed Care – PPO | Admitting: Physical Therapy

## 2022-08-04 DIAGNOSIS — R279 Unspecified lack of coordination: Secondary | ICD-10-CM | POA: Diagnosis not present

## 2022-08-04 DIAGNOSIS — R293 Abnormal posture: Secondary | ICD-10-CM | POA: Insufficient documentation

## 2022-08-04 DIAGNOSIS — M357 Hypermobility syndrome: Secondary | ICD-10-CM | POA: Insufficient documentation

## 2022-08-04 DIAGNOSIS — M62838 Other muscle spasm: Secondary | ICD-10-CM | POA: Insufficient documentation

## 2022-08-04 DIAGNOSIS — Q7962 Hypermobile Ehlers-Danlos syndrome: Secondary | ICD-10-CM | POA: Insufficient documentation

## 2022-08-04 DIAGNOSIS — M2550 Pain in unspecified joint: Secondary | ICD-10-CM | POA: Insufficient documentation

## 2022-08-04 DIAGNOSIS — M6281 Muscle weakness (generalized): Secondary | ICD-10-CM | POA: Insufficient documentation

## 2022-08-06 ENCOUNTER — Encounter: Payer: BC Managed Care – PPO | Admitting: Physical Therapy

## 2022-08-06 DIAGNOSIS — F411 Generalized anxiety disorder: Secondary | ICD-10-CM | POA: Diagnosis not present

## 2022-08-06 DIAGNOSIS — F321 Major depressive disorder, single episode, moderate: Secondary | ICD-10-CM | POA: Diagnosis not present

## 2022-08-10 NOTE — Therapy (Unsigned)
OUTPATIENT PHYSICAL THERAPY TREATMENT NOTE   Patient Name: Kathryn Park MRN: [id number] DOB:[birth year], 36 y.o., female Today's Date: 08/11/2022  PCP: [name], Kila   REFERRING PROVIDER: [name], PA Dr. [name]  END OF SESSION:   PT End of Session - 08/11/22 0800     Visit Number 6    Number of Visits 12    Date for PT Re-Evaluation 09/29/22    Authorization Type BCBS    PT Start Time 0800    PT Stop Time 0845    PT Time Calculation (min) 45 min    Activity Tolerance Patient tolerated treatment well    Behavior During Therapy WFL for tasks assessed/performed                 Past Medical History:  Diagnosis Date   Abnormal Pap smear of cervix    Allergy    Anemia    suspected poor diet; vegetarian during teens   Anxiety    Asthma    childhood   Depression    Dysmenorrhea    Family history of breast cancer    Family history of kidney cancer    Granulosa cell tumor of ovary    History of iron deficiency anemia    age 36   Joint pain    Labral tear of right hip joint    Leukocytosis    Low vitamin D level    Migraine with aura    never on rx or saw neurology   Ovarian cancer ([place]) 2021   Ovarian cyst    Left   Pneumonia 01/2012    left lower lobe   Pre-diabetes 2015   had a "borderline" A1c   STD (sexually transmitted disease)    HSV1   Wears contact lenses    Wears glasses    Past Surgical History:  Procedure Laterality Date   BREAST BIOPSY Right 04/2020   benign   COLPOSCOPY     LAPAROSCOPIC OVARIAN CYSTECTOMY Left 11/13/2019   Procedure: LAPAROSCOPIC OVARIAN CYSTECTOMY/POSSIBLE LEFT OOPHORECTOMY/ COLLECTION OF PELVIC WASHINGS/ks;  Surgeon: Nunzio Cobbs, MD;  Location: [facility];  Service: Gynecology;  Laterality: Left;  Possible left oophorectomy Colection of pelvic washings To follow first case of the day   LEFT OOPHORECTOMY Left 11/2019   right hip arthroscopy  01/2022    for laberal tear repair   TONSILLECTOMY     UPPER GI ENDOSCOPY     Patient Active Problem List   Diagnosis Date Noted   EDS (Ehlers-Danlos syndrome) 06/10/2022   Tear of right acetabular labrum 07/22/2021   Breast lump 02/12/2021   Bilateral hip pain 11/25/2020   Positive ANA (antinuclear antibody) 11/25/2020   Other fatigue 11/25/2020   Bladder retention 10/13/2020   HSV-1 (herpes simplex virus 1) infection 10/13/2020   History of anorexia nervosa 10/13/2020   GAD (generalized anxiety disorder) 10/13/2020   Family history of breast cancer    Family history of kidney cancer    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    REFERRING DIAG: [name] syndrome    THERAPY DIAG:  Muscle weakness (generalized)  Unspecified lack of coordination  Abnormal posture  Ehlers-Danlos syndrome type III  Hypermobility syndrome  Polyarthralgia  Other muscle spasm  Rationale for Evaluation and Treatment Rehabilitation  PERTINENT HISTORY: Rt hip labral repair Ovarian cancer POTS Migraines   surgery on by Dr. Aretha Parrot on 01/21/22.  PRECAUTIONS: Other: Ligament laxity/joint instability , POTS, monitor  symptoms                                                                                                                                                                                 SUBJECTIVE STATEMENT: Rt knee and hip wobbly today.  Still not sure what her triggers are.  Unrelated? last week after the session within an hour or 2 "I had Rt shoulder and back spasms"    PAIN:  Are you having pain? Yes: NPRS scale: 3/10-4/10 Pain location:hip and R shoulder  Pain description: sore Aggravating factors: hip- prolonged posiitoning  Relieving factors: heat, rest, yoga ball , movement    OBJECTIVE: (objective measures completed at initial evaluation unless otherwise dated)   DIAGNOSTIC FINDINGS: NONE    PATIENT SURVEYS:  BIS survey given on eval    COGNITION:             Overall cognitive status: Within functional limits for tasks assessed                          SENSATION: WFL   EDEMA:  NA   MUSCLE LENGTH: Hamstrings: SLR to 90 deg  Thomas test: NT   POSTURE:  mild forward head, normal spinal alignment    PALPATION: Grossly TTP in upper mid and lower back, hips Patient met criteria for mild skin hyperextensibility, stretch marks, bilateral Pisa genic papules of the heel, history of dental crowding, and arachnodactyly.   Beighton Scale Lumbar (1_/1) Knees (_1/2) Elbows (2_/2)  5th digit (1/_2) Thumb (_2/2)   7/9     LOWER EXTREMITY ROM:   Active ROM Right eval Left eval  Hip flexion WNL WNL  Hip extension      Hip abduction      Hip adduction      Hip internal rotation hyper hyper  Hip external rotation Relatively tight but [facility]  Knee flexion [facility] WFL  Knee extension hyper WFL  Ankle dorsiflexion      Ankle plantarflexion      Ankle inversion      Ankle eversion       (Blank rows = not tested)   LOWER EXTREMITY MMT:   MMT Right eval Left eval  Hip flexion 5 5  Hip extension 5 5  Hip abduction 4 4  Hip adduction      Hip internal rotation      Hip external rotation      Knee flexion 5 5  Knee extension 5 5  Ankle dorsiflexion 5 5  Ankle plantarflexion      Ankle inversion      Ankle eversion       (  Blank rows = not tested)   LOWER EXTREMITY SPECIAL TESTS:  NT   FUNCTIONAL TESTS:                            SLS WNL with focus Good squat with min cues, good depth, min hip discomfort    GAIT: Distance walked: 150 Assistive device utilized: None Level of assistance: Complete Independence Comments: no deviations     [facility] Adult PT Treatment:                                                DATE: 08/11/22 Therapeutic Exercise: NuStep smaller ROM, level 3 UE and LE for 5 min Reformer: Seated Arms facing back/Facing forward Row 1 red spring x 10  Extension 1 blue spring x 10  External rotation 1 blue  bilateral x 10 Horizontal abd yellow band x 10  Serving (shoulder flexion) 1 yellow x 8  Hug a tree yellow x 8  Footwork 2 red, 1 blue springs  Double leg on heels parallel and also in V Single leg press out 1 red 1 blue with opp leg in table top , arch is more comfortable Self Care: Neutral joint position Other practitioners to address concerns (Neuro, Allergist, other)    [facility] Adult PT Treatment:                                                DATE: 08/04/22 Therapeutic Exercise: Recumbent HR 74 at rest, up to 95 with level 2 flat road for 5 min  Supine breathing, Tr A x 10  Isometric ball squeeze x 10  Hold ball with adductors then with upper body: red band shoulder press, narrow grip overhead lift and ER about 10 each  TrA with heel slides x  10  TrA with bent knee fallouts unilateral 2 sets with ball under feel and then red band around thighs x 10  Tra with articulating bridge, red band  Sidelying clam x 10 x 2 with red band  Sidelying hip abduction with no band x 10 x 2  Modified quadruped UE lift x 5 added opposite hip extension x 5 (bird dog)   [facility] Adult PT Treatment:                                                DATE: 07/28/22 Therapeutic Exercise: Supine breathing, placed dynadisc under pelvis TrA with heel slides alternating progressed to 90/90 to knee extension  LTR between sets to reduce tension Isometric Pilates circle squeeze chest hgt  Added knee lift x 8 alternating side  TrA with bent knee fallouts unilateral , without issues  Tra with articulating bridge, pt did well initially, coordination of breathing an issue  Sidelying clam x 15 no band Sidelying hip abduction with knee extension x 8, circles  Figure 4 after sets  Wall sit 30 sec x 3, added bilat. Shoulder movements with circle  Wall push ups elbows out to 45 and then triceps x 10 each   [facility] Adult PT Treatment:  DATE: 07/23/22 Therapeutic Exercise: Supine  Tr A activation with ball squeeze , multiple positions and variations (prop on forearm, ball under sacrum)  Supine march with pelvic stability  Knee extension x 8 each side  Clam double leg fallout x 10  Pilates Reformer used for LE/core strength, postural strength, lumbopelvic disassociation and core control.  Exercises included: Footwork 2 red springs  Double leg on heels, toes in parallel and toes  Narrow and wide positions Single leg press out 1 red 1 blue with opp leg in table top with ball behind knee   [facility] Adult PT Treatment:                                                DATE: 07/21/22 Therapeutic Exercise: TrA activation with palpation- breathing- difficulty relaxing shoulders/tension in neck-better with 2 pillows.  TrA with heel slides alternating - right hip , left knee popping TrA with bent knee fallouts bilat and alternating- right hip pain- able to add resistance band with min increased pain Tra with articulating bridge- pt reports difficulty with activation and experienced muscle spasms on right side Qped over exercise bal to off load UEs- hip ext alternating x 10- increased wrist pain Plank on knees and forearms- 15 sec x 3 - pain in left shoulder with coming down from plank.  No additions to HEP   TODAY'S TREATMENT: PT eval and intro to core, HEP and stabilization      PATIENT EDUCATION:  Education details: HEP, POC, neutral spine, pilates, proprioception Person educated: Patient Education method: Explanation, Demonstration, Tactile cues, Verbal cues, and Handouts Education comprehension: verbalized understanding and needs further education     HOME EXERCISE PROGRAM:   Access Code: [email or web address] URL: [email or web address] Date: 07/28/2022 Prepared by: Raeford Razor  Exercises - Hooklying Transversus Abdominis Palpation  - 2 x daily - 7 x weekly - 2 sets - 10 reps - 10 hold - Supine 90/90 Abdominal Bracing  - 2 x daily - 7 x weekly - 1 sets - 3-5 reps  - 30 hold - Bent Knee Fallouts  - 1 x daily - 7 x weekly - 2 sets - 10 reps - 30 hold - Supine Bridge with Mini Swiss Ball Between Knees  - 2 x daily - 7 x weekly - 2 sets - 10 reps - 5 hold - Clamshell  - 1 x daily - 7 x weekly - 2 sets - 10 reps - 30 hold - Wall Quarter Squat  - 1 x daily - 7 x weekly - 1 sets - 5 reps - 30-60 hold  +horiz abd and ER with yellow band   ASSESSMENT:   CLINICAL IMPRESSION: Patient was able to use the reformer for gentle upper body seated strengthening exercises.  Significant modification to spring tension was needed due to fatigue as well as involuntary muscle twitch\tremors which is concerning for the patient.  She has not yet seen a neurologist but thinks she should.  Provided reassurance to her that what is happening seems to be related to fatigue but also mentioned some visual changes when upper back and neck pain occurs.  She was able to increase the spring tension with foot work today reported no increased knee or hip pain with these exercises.  She is noticing improvement in lumbopelvic stability with each session.  OBJECTIVE IMPAIRMENTS decreased activity tolerance, difficulty walking,  decreased strength, dizziness, increased fascial restrictions, improper body mechanics, and pain.    ACTIVITY LIMITATIONS carrying, lifting, sitting, standing, squatting, and sleeping   PARTICIPATION LIMITATIONS: interpersonal relationship, shopping, community activity, occupation, and recreation   PERSONAL FACTORS Time since onset of injury/illness/exacerbation and 3+ comorbidities: migraines, POTS, hip surgery  are also affecting patient's functional outcome.    REHAB POTENTIAL: Excellent   CLINICAL DECISION MAKING: Evolving/moderate complexity   EVALUATION COMPLEXITY: Moderate     GOALS: Goals reviewed with patient? Yes     LONG TERM GOALS: Target date: 08/25/2022    Patient will be I with HEP upon discharge for core, hips, posture Baseline:  Goal status:  ongoing    2.  Pt will be able to demonstrate optimal posture and body mechanics for basic lifting, ADLs to minimize joint strain.  Baseline:  Goal status: ongoing    3.  Pt will be able to report greater stability in knees, hips with walking longer distances in the community  Baseline:  Goal status: ongoing    4.  Pt will be able to tolerate therapy session with other ADLs, usual work with no more than min increase in fatigue Baseline: still works through the fatigue Goal status: ongoing   5.  Patient will use tape or other external support as needed for joint stability when needed for more demanding tasks  Baseline: compression, has used tape for her hip, knee  Goal status: ongoing    6.  [place] Impact Scale score TBD Baseline:  Goal status: INITIAL     PLAN: PT FREQUENCY: 2x/week   PT DURATION: 6 weeks-8 weeks    PLANNED INTERVENTIONS: Therapeutic exercises, Therapeutic activity, Neuromuscular re-education, Balance training, Patient/Family education, Self Care, Joint mobilization, Dry Needling, Taping, Manual therapy, and Re-evaluation   PLAN FOR NEXT SESSION: check HEP. Add on to stab. Q-ped, Modify for UE pain. Try pilates footwork , add on to reformer.       Raeford Razor, PT 08/11/22 9:58 AM Phone: [phone] Fax: [phone]

## 2022-08-11 ENCOUNTER — Encounter: Payer: Self-pay | Admitting: Physical Therapy

## 2022-08-11 ENCOUNTER — Ambulatory Visit: Payer: BC Managed Care – PPO | Admitting: Physical Therapy

## 2022-08-11 ENCOUNTER — Encounter: Payer: BC Managed Care – PPO | Admitting: Physical Therapy

## 2022-08-11 DIAGNOSIS — M6281 Muscle weakness (generalized): Secondary | ICD-10-CM | POA: Diagnosis not present

## 2022-08-11 DIAGNOSIS — R293 Abnormal posture: Secondary | ICD-10-CM | POA: Diagnosis not present

## 2022-08-11 DIAGNOSIS — M62838 Other muscle spasm: Secondary | ICD-10-CM

## 2022-08-11 DIAGNOSIS — M2550 Pain in unspecified joint: Secondary | ICD-10-CM | POA: Diagnosis not present

## 2022-08-11 DIAGNOSIS — Q7962 Hypermobile Ehlers-Danlos syndrome: Secondary | ICD-10-CM

## 2022-08-11 DIAGNOSIS — R279 Unspecified lack of coordination: Secondary | ICD-10-CM

## 2022-08-11 DIAGNOSIS — M357 Hypermobility syndrome: Secondary | ICD-10-CM | POA: Diagnosis not present

## 2022-08-13 ENCOUNTER — Encounter: Payer: BC Managed Care – PPO | Admitting: Physical Therapy

## 2022-08-13 DIAGNOSIS — F411 Generalized anxiety disorder: Secondary | ICD-10-CM | POA: Diagnosis not present

## 2022-08-13 DIAGNOSIS — F321 Major depressive disorder, single episode, moderate: Secondary | ICD-10-CM | POA: Diagnosis not present

## 2022-08-17 NOTE — Therapy (Unsigned)
OUTPATIENT PHYSICAL THERAPY TREATMENT NOTE   Patient Name: Kathryn Park MRN: [id number] DOB:11/30/1985, 36 y.o., female Today's Date: 08/18/2022  PCP: [name], PA   REFERRING PROVIDER: [name], PA Dr. [name]  END OF SESSION:   PT End of Session - 08/18/22 0852     Visit Number 7    Number of Visits 12    Date for PT Re-Evaluation 09/29/22    Authorization Type BCBS    PT Start Time 0850    PT Stop Time 0930    PT Time Calculation (min) 40 min    Activity Tolerance Patient tolerated treatment well    Behavior During Therapy Anxious                  Past Medical History:  Diagnosis Date   Abnormal Pap smear of cervix    Allergy    Anemia    suspected poor diet; vegetarian during teens   Anxiety    Asthma    childhood   Depression    Dysmenorrhea    Family history of breast cancer    Family history of kidney cancer    Granulosa cell tumor of ovary    History of iron deficiency anemia    age 69   Joint pain    Labral tear of right hip joint    Leukocytosis    Low vitamin D level    Migraine with aura    never on rx or saw neurology   Ovarian cancer ([place]) 2021   Ovarian cyst    Left   Pneumonia 01/2012    left lower lobe   Pre-diabetes 2015   had a "borderline" A1c   STD (sexually transmitted disease)    HSV1   Wears contact lenses    Wears glasses    Past Surgical History:  Procedure Laterality Date   BREAST BIOPSY Right 04/2020   benign   COLPOSCOPY     LAPAROSCOPIC OVARIAN CYSTECTOMY Left 11/13/2019   Procedure: LAPAROSCOPIC OVARIAN CYSTECTOMY/POSSIBLE LEFT OOPHORECTOMY/ COLLECTION OF PELVIC WASHINGS/ks;  Surgeon: Nunzio Cobbs, MD;  Location: [facility];  Service: Gynecology;  Laterality: Left;  Possible left oophorectomy Colection of pelvic washings To follow first case of the day   LEFT OOPHORECTOMY Left 11/2019   right hip arthroscopy  01/2022   for laberal tear  repair   TONSILLECTOMY     UPPER GI ENDOSCOPY     Patient Active Problem List   Diagnosis Date Noted   EDS (Ehlers-Danlos syndrome) 06/10/2022   Tear of right acetabular labrum 07/22/2021   Breast lump 02/12/2021   Bilateral hip pain 11/25/2020   Positive ANA (antinuclear antibody) 11/25/2020   Other fatigue 11/25/2020   Bladder retention 10/13/2020   HSV-1 (herpes simplex virus 1) infection 10/13/2020   History of anorexia nervosa 10/13/2020   GAD (generalized anxiety disorder) 10/13/2020   Family history of breast cancer    Family history of kidney cancer    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    REFERRING DIAG: [name] syndrome    THERAPY DIAG:  Muscle weakness (generalized)  Unspecified lack of coordination  Abnormal posture  Ehlers-Danlos syndrome type III  Polyarthralgia  Other muscle spasm  Hypermobility syndrome  Rationale for Evaluation and Treatment Rehabilitation  PERTINENT HISTORY: Rt hip labral repair Ovarian cancer POTS Migraines   surgery on by Dr. Aretha Parrot on 01/21/22.  PRECAUTIONS: Other: Ligament laxity/joint instability , POTS, monitor symptoms  SUBJECTIVE STATEMENT: Walks in with cane.  Rt knee and hip wobbly today.  Seeing PA today.  Asking for cardiologist, allergist and neurologist.  Muscles sometimes just "go offline".  She only has pain in fingers, hands today.  No spasms in core.     PAIN:  Are you having pain? not today : NPRS scale:none today  Pain location:hip and R shoulder  Pain description: sore Aggravating factors: hip- prolonged posiitoning  Relieving factors: heat, rest, yoga ball , movement    OBJECTIVE: (objective measures completed at initial evaluation unless otherwise dated)   DIAGNOSTIC FINDINGS: NONE    PATIENT SURVEYS:  BIS  survey given on eval    COGNITION:            Overall cognitive status: Within functional limits for tasks assessed                          SENSATION: WFL   EDEMA:  NA   MUSCLE LENGTH: Hamstrings: SLR to 90 deg  Thomas test: NT   POSTURE:  mild forward head, normal spinal alignment    PALPATION: Grossly TTP in upper mid and lower back, hips Patient met criteria for mild skin hyperextensibility, stretch marks, bilateral Pisa genic papules of the heel, history of dental crowding, and arachnodactyly.   Beighton Scale Lumbar (1_/1) Knees (_1/2) Elbows (2_/2)  5th digit (1/_2) Thumb (_2/2)   7/9     LOWER EXTREMITY ROM:   Active ROM Right eval Left eval  Hip flexion WNL WNL  Hip extension      Hip abduction      Hip adduction      Hip internal rotation hyper hyper  Hip external rotation Relatively tight but [facility]  Knee flexion [facility] WFL  Knee extension hyper WFL  Ankle dorsiflexion      Ankle plantarflexion      Ankle inversion      Ankle eversion       (Blank rows = not tested)   LOWER EXTREMITY MMT:   MMT Right eval Left eval  Hip flexion 5 5  Hip extension 5 5  Hip abduction 4 4  Hip adduction      Hip internal rotation      Hip external rotation      Knee flexion 5 5  Knee extension 5 5  Ankle dorsiflexion 5 5  Ankle plantarflexion      Ankle inversion      Ankle eversion       (Blank rows = not tested)   LOWER EXTREMITY SPECIAL TESTS:  NT   FUNCTIONAL TESTS:                            SLS WNL with focus Good squat with min cues, good depth, min hip discomfort    GAIT: Distance walked: 150 Assistive device utilized: None Level of assistance: Complete Independence Comments: no deviations   [facility] Adult PT Treatment:                                                DATE: 08/18/22 Therapeutic Exercise: Supine banded knee extension red looped band x 30-40 sec each  Ball squeeze adduction with abdominal draw in x 10  Bridging with  articulation x 8 then flat  back x 8 Single leg small amplitude bridge x 6 each side  Lower ab march on ball , pain in Rt rib cage Open book x 3 each side , breathing  Lower ab march without ball , less pain 90/90 hold  90/90 hold with alternating shoulder flexion x 10  90/90 knee extension x 10 alternating , added red band to reduce instability in hips   Self Care: Tips for glute activation HEP progression    [facility] Adult PT Treatment:                                                DATE: 08/11/22 Therapeutic Exercise: NuStep smaller ROM, level 3 UE and LE for 5 min Reformer: Seated Arms facing back/Facing forward Row 1 red spring x 10  Extension 1 blue spring x 10  External rotation 1 blue bilateral x 10 Horizontal abd yellow band x 10  Serving (shoulder flexion) 1 yellow x 8  Hug a tree yellow x 8  Footwork 2 red, 1 blue springs  Double leg on heels parallel and also in V Single leg press out 1 red 1 blue with opp leg in table top , arch is more comfortable Self Care: Neutral joint position Other practitioners to address concerns (Neuro, Allergist, other)    [facility] Adult PT Treatment:                                                DATE: 08/04/22 Therapeutic Exercise: Recumbent HR 74 at rest, up to 95 with level 2 flat road for 5 min  Supine breathing, Tr A x 10  Isometric ball squeeze x 10  Hold ball with adductors then with upper body: red band shoulder press, narrow grip overhead lift and ER about 10 each  TrA with heel slides x  10  TrA with bent knee fallouts unilateral 2 sets with ball under feel and then red band around thighs x 10  Tra with articulating bridge, red band  Sidelying clam x 10 x 2 with red band  Sidelying hip abduction with no band x 10 x 2  Modified quadruped UE lift x 5 added opposite hip extension x 5 (bird dog)   [facility] Adult PT Treatment:                                                DATE: 07/28/22 Therapeutic Exercise: Supine breathing, placed  dynadisc under pelvis TrA with heel slides alternating progressed to 90/90 to knee extension  LTR between sets to reduce tension Isometric Pilates circle squeeze chest hgt  Added knee lift x 8 alternating side  TrA with bent knee fallouts unilateral , without issues  Tra with articulating bridge, pt did well initially, coordination of breathing an issue  Sidelying clam x 15 no band Sidelying hip abduction with knee extension x 8, circles  Figure 4 after sets  Wall sit 30 sec x 3, added bilat. Shoulder movements with circle  Wall push ups elbows out to 45 and then triceps x 10 each     PATIENT EDUCATION:  Education details: HEP, POC, neutral spine, pilates, proprioception Person educated: Patient Education method: Explanation, Demonstration, Tactile cues, Verbal cues, and Handouts Education comprehension: verbalized understanding and needs further education     HOME EXERCISE PROGRAM:   Access Code: [email or web address] URL: [email or web address] Date: 08/18/2022 Prepared by: Raeford Razor  Exercises - Hooklying Transversus Abdominis Palpation  - 2 x daily - 7 x weekly - 2 sets - 10 reps - 10 hold - Supine 90/90 Abdominal Bracing  - 2 x daily - 7 x weekly - 1 sets - 3-5 reps - 30 hold - Supine 90/90 with Leg Extensions  - 1 x daily - 7 x weekly - 2 sets - 10 reps - 30 hold - Bent Knee Fallouts  - 1 x daily - 7 x weekly - 2 sets - 10 reps - 30 hold - Supine Bridge with Mini Swiss Ball Between Knees  - 2 x daily - 7 x weekly - 2 sets - 10 reps - 5 hold - Clamshell  - 1 x daily - 7 x weekly - 2 sets - 10 reps - 30 hold - Wall Quarter Squat  - 1 x daily - 7 x weekly - 1 sets - 5 reps - 30-60 hold - Supine Shoulder Horizontal Abduction with Resistance  - 1 x daily - 7 x weekly - 2 sets - 10 reps - 5 hold - Supine Shoulder External Rotation with Resistance  - 1 x daily - 7 x weekly - 2 sets - 10 reps - 5 hold - Sidelying Thoracic Rotation with Open Book  - 1 x daily - 7 x weekly - 1  sets - 3 reps - 30 hold  ASSESSMENT:   CLINICAL IMPRESSION: Patient is seeing PA today and will be asking for referrals for specialists, including cardiology, neuro and allergist.  She continues to "over think" a lot of her exercises.  Upper back, neck tension when supine and performing LE , lower core exercises indicates deep core weakness and an overactive nervous system. This is improving overall.  Using breathing for rib pain was effective today.  She does breath work in the AM "to get her body ready for the day" .  Used cane today as her Rt hip and Rt knee were wobbly" and she notices this when moving slowly "meandering" and when standing long periods. Will cont POC, focusing on a calming yet challenging load to her proximal joints and deep core.   OBJECTIVE IMPAIRMENTS decreased activity tolerance, difficulty walking, decreased strength, dizziness, increased fascial restrictions, improper body mechanics, and pain.    ACTIVITY LIMITATIONS carrying, lifting, sitting, standing, squatting, and sleeping   PARTICIPATION LIMITATIONS: interpersonal relationship, shopping, community activity, occupation, and recreation   PERSONAL FACTORS Time since onset of injury/illness/exacerbation and 3+ comorbidities: migraines, POTS, hip surgery  are also affecting patient's functional outcome.    REHAB POTENTIAL: Excellent   CLINICAL DECISION MAKING: Evolving/moderate complexity   EVALUATION COMPLEXITY: Moderate     GOALS: Goals reviewed with patient? Yes     LONG TERM GOALS: Target date: 08/25/2022    Patient will be I with HEP upon discharge for core, hips, posture Baseline:  Goal status: ongoing    2.  Pt will be able to demonstrate optimal posture and body mechanics for basic lifting, ADLs to minimize joint strain.  Baseline:  Goal status: ongoing    3.  Pt will be able to report greater stability in knees, hips with walking longer distances in the community  Baseline:  Goal status:  ongoing    4.  Pt will be able to tolerate therapy session with other ADLs, usual work with no more than min increase in fatigue Baseline: still works through the fatigue Goal status: ongoing   5.  Patient will use tape or other external support as needed for joint stability when needed for more demanding tasks  Baseline: compression, has used tape for her hip, knee  Goal status: ongoing    6.  Bristol Impact Scale score TBD Baseline:  Goal status: INITIAL     PLAN: PT FREQUENCY: 2x/week   PT DURATION: 6 weeks-8 weeks    PLANNED INTERVENTIONS: Therapeutic exercises, Therapeutic activity, Neuromuscular re-education, Balance training, Patient/Family education, Self Care, Joint mobilization, Dry Needling, Taping, Manual therapy, and Re-evaluation   PLAN FOR NEXT SESSION: check HEP. Add on to stab. Q-ped, Modify for UE pain. Try pilates footwork , add on to reformer.       Raeford Razor, PT 08/18/22 10:57 AM Phone: [phone] Fax: [phone]

## 2022-08-18 ENCOUNTER — Ambulatory Visit: Payer: BC Managed Care – PPO | Admitting: Physical Therapy

## 2022-08-18 ENCOUNTER — Encounter: Payer: Self-pay | Admitting: Physician Assistant

## 2022-08-18 ENCOUNTER — Ambulatory Visit (INDEPENDENT_AMBULATORY_CARE_PROVIDER_SITE_OTHER): Payer: BC Managed Care – PPO | Admitting: Physician Assistant

## 2022-08-18 ENCOUNTER — Encounter: Payer: Self-pay | Admitting: Physical Therapy

## 2022-08-18 VITALS — BP 118/70 | HR 75 | Temp 97.3°F | Ht 66.0 in | Wt 130.5 lb

## 2022-08-18 DIAGNOSIS — R279 Unspecified lack of coordination: Secondary | ICD-10-CM | POA: Diagnosis not present

## 2022-08-18 DIAGNOSIS — M6281 Muscle weakness (generalized): Secondary | ICD-10-CM | POA: Diagnosis not present

## 2022-08-18 DIAGNOSIS — F909 Attention-deficit hyperactivity disorder, unspecified type: Secondary | ICD-10-CM

## 2022-08-18 DIAGNOSIS — M62838 Other muscle spasm: Secondary | ICD-10-CM

## 2022-08-18 DIAGNOSIS — R293 Abnormal posture: Secondary | ICD-10-CM | POA: Diagnosis not present

## 2022-08-18 DIAGNOSIS — M2550 Pain in unspecified joint: Secondary | ICD-10-CM

## 2022-08-18 DIAGNOSIS — Q7962 Hypermobile Ehlers-Danlos syndrome: Secondary | ICD-10-CM | POA: Diagnosis not present

## 2022-08-18 DIAGNOSIS — G255 Other chorea: Secondary | ICD-10-CM | POA: Diagnosis not present

## 2022-08-18 DIAGNOSIS — G90A Postural orthostatic tachycardia syndrome (POTS): Secondary | ICD-10-CM | POA: Diagnosis not present

## 2022-08-18 DIAGNOSIS — Q796 Ehlers-Danlos syndrome, unspecified: Secondary | ICD-10-CM

## 2022-08-18 DIAGNOSIS — M357 Hypermobility syndrome: Secondary | ICD-10-CM | POA: Diagnosis not present

## 2022-08-18 DIAGNOSIS — Z91018 Allergy to other foods: Secondary | ICD-10-CM

## 2022-08-18 DIAGNOSIS — F411 Generalized anxiety disorder: Secondary | ICD-10-CM

## 2022-08-18 NOTE — Progress Notes (Signed)
Kathryn Park Kathryn Park is a 36 y.o. female here for a follow up of a pre-existing problem.  History of Present Illness:   Chief Complaint  Patient presents with   Ehlers-Danlos syndrome    Pt is here to discuss and would like a referral to a specialist.    HPI  Ehlers-Danlos Syndrome Patient is requesting a cardiologist referral as she feels as though it would be beneficial for her and does not know much about the body and does not know what she needs to do to well manage herself in regards to her POTS. She is also requesting a referral to an allergist. She does not know if she is allergic to anything, but states that she may develop a rash when she gets out of the shower sometimes. She also used to have allergies and sensitivities to lots of foods when younger.  She is thinking about getting a referral to a neurologist because she states that on her bad days, her muscles would give out if she made a turn when walking or if walking slow. She is also experiencing tremors. Patient reports that she occasionally kicks involuntarily on her right side when she is at rest. Her husband reports that she has increased sleep twitches.   ADHD We started adderall 5 mg at her last visit. She has taken this and does provide slight benefit to her focus. She does note however that it makes her clench her jaw and worsen her TMJ. She does not want to continue this at this time.   Mood Patient reports that she is in a stable mood and is doing well. She is complaint with her 10 mg Lexapro daily x 2 weeks per month. Denies SI/HI.  Past Medical History:  Diagnosis Date   Abnormal Pap smear of cervix    Allergy    Anemia    suspected poor diet; vegetarian during teens   Anxiety    Asthma    childhood   Depression    Dysmenorrhea    Family history of breast cancer    Family history of kidney cancer    Granulosa cell tumor of ovary    History of iron deficiency anemia    age 72   Joint pain     Labral tear of right hip joint    Leukocytosis    Low vitamin D level    Migraine with aura    never on rx or saw neurology   Ovarian cancer ([place]) 2021   Ovarian cyst    Left   Pneumonia 01/2012    left lower lobe   Pre-diabetes 2015   had a "borderline" A1c   STD (sexually transmitted disease)    HSV1   Wears contact lenses    Wears glasses      Social History   Tobacco Use   Smoking status: Former    Packs/day: 1.00    Types: Cigarettes   Smokeless tobacco: Never  Vaping Use   Vaping Use: Never used  Substance Use Topics   Alcohol use: Not Currently   Drug use: No    Past Surgical History:  Procedure Laterality Date   BREAST BIOPSY Right 04/2020   benign   COLPOSCOPY     LAPAROSCOPIC OVARIAN CYSTECTOMY Left 11/13/2019   Procedure: LAPAROSCOPIC OVARIAN CYSTECTOMY/POSSIBLE LEFT OOPHORECTOMY/ COLLECTION OF PELVIC WASHINGS/ks;  Surgeon: Nunzio Cobbs, MD;  Location: [facility];  Service: Gynecology;  Laterality: Left;  Possible left oophorectomy Colection of  pelvic washings To follow first case of the day   LEFT OOPHORECTOMY Left 11/2019   right hip arthroscopy  01/2022   for laberal tear repair   TONSILLECTOMY     UPPER GI ENDOSCOPY      Family History  Problem Relation Age of Onset   Diabetes Mother    Hypertension Mother    Stroke Mother        TIA   Factor V Leiden deficiency Mother    Other Mother        pituitary tumor   Colon polyps Mother    Diverticulitis Mother    Kidney cancer Father        cancerous tumor of kidney   Ulcerative colitis Father    Other Brother        ear tumor, dx. age 45-6   Breast cancer Maternal Grandmother        dx. 80s   Stroke Maternal Grandfather    Rheum arthritis Maternal Great-grandmother    Cancer Other    Ovarian cancer Neg Hx    Endometrial cancer Neg Hx    Colon cancer Neg Hx     Allergies  Allergen Reactions   Shellfish Allergy Anaphylaxis    Throat closes   Other  Other (See Comments)    Watermelon, mold/mildew, pet dander, dust and dust mites  Swelling of eyes and congestion   Latex Rash   Sulfa Antibiotics Rash    Current Medications:   Current Outpatient Medications:    busPIRone (BUSPAR) 5 MG tablet, Take 1 tablet (5 mg total) by mouth 3 (three) times daily as needed (anxiety)., Disp: 90 tablet, Rfl: 1   cetirizine (ZYRTEC) 10 MG tablet, Take 10 mg by mouth daily., Disp: , Rfl:    cyanocobalamin 1000 MCG tablet, Take 1,000 mcg by mouth daily., Disp: , Rfl:    escitalopram (LEXAPRO) 20 MG tablet, Take 1/2 tablet ('10mg'$ ) tablet by mouth day. (Patient taking differently: Take 1/2 tablet ('10mg'$ ) tablet by mouth daily for 2 weeks out of cycle), Disp: 45 tablet, Rfl: 1   magnesium oxide (MAG-OX) 400 (240 Mg) MG tablet, Take 400 mg by mouth daily., Disp: , Rfl:    Melatonin 10 MG TABS, Take 1 tablet by mouth at bedtime., Disp: , Rfl:    Multiple Vitamins-Minerals (MULTIPLE VITAMINS/WOMENS PO), Take 1 tablet by mouth daily in the afternoon., Disp: , Rfl:    valACYclovir (VALTREX) 1000 MG tablet, Take 2 tablets (2000 mg) by mouth twice a day for 24 hours as needed for cold sore., Disp: 30 tablet, Rfl: 1   Review of Systems:   ROS Negative unless otherwise specified per HPI.  Vitals:   Vitals:   08/18/22 1101  BP: 118/70  Pulse: 75  Temp: (!) 97.3 F (36.3 C)  TempSrc: Temporal  SpO2: 98%  Weight: 130 lb 8 oz (59.2 kg)  Height: '5\' 6"'$  (1.676 m)     Body mass index is 21.06 kg/m.  Physical Exam:   Physical Exam Constitutional:      Appearance: Normal appearance. She is well-developed.  HENT:     Head: Normocephalic and atraumatic.  Eyes:     General: Lids are normal.     Extraocular Movements: Extraocular movements intact.     Conjunctiva/sclera: Conjunctivae normal.  Pulmonary:     Effort: Pulmonary effort is normal.  Musculoskeletal:        General: Normal range of motion.     Cervical back: Normal range of motion and neck  supple.  Skin:    General: Skin is warm and dry.  Neurological:     Mental Status: She is alert and oriented to person, place, and time.  Psychiatric:        Attention and Perception: Attention and perception normal.        Mood and Affect: Mood normal.        Behavior: Behavior normal.        Thought Content: Thought content normal.        Judgment: Judgment normal.     Assessment and Plan:   POTS (postural orthostatic tachycardia syndrome) Referral to cardiology per patient request  Muscle, jerky movements (uncontrolled) Referral to neurology per patient request  EDS (Ehlers-Danlos syndrome) Continue management with specialists and healthy lifestyle  Attention deficit hyperactivity disorder (ADHD), unspecified ADHD type Well controlled without medication at this time She will reach out if she wants to re-visit this topic with medication  GAD (generalized anxiety disorder) Well controlled  Continue Lexapro 10 mg daily x 2 weeks per month  Hx of food allergy Referral to allergy per patient request  I,Verona Buck,acting as a scribe for [employer], PA.,have documented all relevant documentation on the behalf of [name], PA,as directed by  [name], PA while in the presence of [name], [place].  I, [name], [place], have reviewed all documentation for this visit. The documentation on 08/18/22 for the exam, diagnosis, procedures, and orders are all accurate and complete.   [name], PA-C

## 2022-08-19 ENCOUNTER — Encounter: Payer: BC Managed Care – PPO | Admitting: Physical Therapy

## 2022-08-20 NOTE — Therapy (Deleted)
OUTPATIENT PHYSICAL THERAPY TREATMENT NOTE   Patient Name: Kathryn Park MRN: [id number] DOB:03-Oct-1986, 36 y.o., female Today's Date: 08/20/2022  PCP: [name], PA   REFERRING PROVIDER: [name], PA Dr. [name]  END OF SESSION:          Past Medical History:  Diagnosis Date   Abnormal Pap smear of cervix    Allergy    Anemia    suspected poor diet; vegetarian during teens   Anxiety    Asthma    childhood   Depression    Dysmenorrhea    Family history of breast cancer    Family history of kidney cancer    Granulosa cell tumor of ovary    History of iron deficiency anemia    age 39   Joint pain    Labral tear of right hip joint    Leukocytosis    Low vitamin D level    Migraine with aura    never on rx or saw neurology   Ovarian cancer ([place]) 2021   Ovarian cyst    Left   Pneumonia 01/2012    left lower lobe   Pre-diabetes 2015   had a "borderline" A1c   STD (sexually transmitted disease)    HSV1   Wears contact lenses    Wears glasses    Past Surgical History:  Procedure Laterality Date   BREAST BIOPSY Right 04/2020   benign   COLPOSCOPY     LAPAROSCOPIC OVARIAN CYSTECTOMY Left 11/13/2019   Procedure: LAPAROSCOPIC OVARIAN CYSTECTOMY/POSSIBLE LEFT OOPHORECTOMY/ COLLECTION OF PELVIC WASHINGS/ks;  Surgeon: Nunzio Cobbs, MD;  Location: [facility];  Service: Gynecology;  Laterality: Left;  Possible left oophorectomy Colection of pelvic washings To follow first case of the day   LEFT OOPHORECTOMY Left 11/2019   right hip arthroscopy  01/2022   for laberal tear repair   TONSILLECTOMY     UPPER GI ENDOSCOPY     Patient Active Problem List   Diagnosis Date Noted   EDS (Ehlers-Danlos syndrome) 06/10/2022   Tear of right acetabular labrum 07/22/2021   Breast lump 02/12/2021   Bilateral hip pain 11/25/2020   Positive ANA (antinuclear antibody) 11/25/2020   Other fatigue 11/25/2020    Bladder retention 10/13/2020   HSV-1 (herpes simplex virus 1) infection 10/13/2020   History of anorexia nervosa 10/13/2020   GAD (generalized anxiety disorder) 10/13/2020   Family history of breast cancer    Family history of kidney cancer    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    REFERRING DIAG: [name] syndrome    THERAPY DIAG:  No diagnosis found.  Rationale for Evaluation and Treatment Rehabilitation  PERTINENT HISTORY: Rt hip labral repair Ovarian cancer POTS Migraines   surgery on by Dr. Aretha Parrot on 01/21/22.  PRECAUTIONS: Other: Ligament laxity/joint instability , POTS, monitor symptoms  SUBJECTIVE STATEMENT: Walks in with cane.  Rt knee and hip wobbly today.  Seeing PA today.  Asking for cardiologist, allergist and neurologist.  Muscles sometimes just "go offline".  She only has pain in fingers, hands today.  No spasms in core.     PAIN:  Are you having pain? not today : NPRS scale:none today  Pain location:hip and R shoulder  Pain description: sore Aggravating factors: hip- prolonged posiitoning  Relieving factors: heat, rest, yoga ball , movement    OBJECTIVE: (objective measures completed at initial evaluation unless otherwise dated)   DIAGNOSTIC FINDINGS: NONE    PATIENT SURVEYS:  BIS survey given on eval    COGNITION:            Overall cognitive status: Within functional limits for tasks assessed                          SENSATION: WFL   EDEMA:  NA   MUSCLE LENGTH: Hamstrings: SLR to 90 deg  Thomas test: NT   POSTURE:  mild forward head, normal spinal alignment    PALPATION: Grossly TTP in upper mid and lower back, hips Patient met criteria for mild skin hyperextensibility, stretch marks, bilateral Pisa genic papules of the heel, history of dental crowding,  and arachnodactyly.   Beighton Scale Lumbar (1_/1) Knees (_1/2) Elbows (2_/2)  5th digit (1/_2) Thumb (_2/2)   7/9     LOWER EXTREMITY ROM:   Active ROM Right eval Left eval  Hip flexion WNL WNL  Hip extension      Hip abduction      Hip adduction      Hip internal rotation hyper hyper  Hip external rotation Relatively tight but [facility]  Knee flexion [facility] WFL  Knee extension hyper WFL  Ankle dorsiflexion      Ankle plantarflexion      Ankle inversion      Ankle eversion       (Blank rows = not tested)   LOWER EXTREMITY MMT:   MMT Right eval Left eval  Hip flexion 5 5  Hip extension 5 5  Hip abduction 4 4  Hip adduction      Hip internal rotation      Hip external rotation      Knee flexion 5 5  Knee extension 5 5  Ankle dorsiflexion 5 5  Ankle plantarflexion      Ankle inversion      Ankle eversion       (Blank rows = not tested)   LOWER EXTREMITY SPECIAL TESTS:  NT   FUNCTIONAL TESTS:                            SLS WNL with focus Good squat with min cues, good depth, min hip discomfort    GAIT: Distance walked: 150 Assistive device utilized: None Level of assistance: Complete Independence Comments: no deviations  [facility] Adult PT Treatment:                                                DATE: 08/23/22 Therapeutic Exercise: *** Manual Therapy: *** Neuromuscular re-ed: *** Therapeutic Activity: *** Modalities: *** Self Care: [facility] Adult PT Treatment:  DATE: 08/18/22 Therapeutic Exercise: Supine banded knee extension red looped band x 30-40 sec each  Ball squeeze adduction with abdominal draw in x 10  Bridging with articulation x 8 then flat back x 8 Single leg small amplitude bridge x 6 each side  Lower ab march on ball , pain in Rt rib cage Open book x 3 each side , breathing  Lower ab march without ball , less pain 90/90 hold  90/90 hold with alternating shoulder flexion x 10  90/90  knee extension x 10 alternating , added red band to reduce instability in hips   Self Care: Tips for glute activation HEP progression    [facility] Adult PT Treatment:                                                DATE: 08/11/22 Therapeutic Exercise: NuStep smaller ROM, level 3 UE and LE for 5 min Reformer: Seated Arms facing back/Facing forward Row 1 red spring x 10  Extension 1 blue spring x 10  External rotation 1 blue bilateral x 10 Horizontal abd yellow band x 10  Serving (shoulder flexion) 1 yellow x 8  Hug a tree yellow x 8  Footwork 2 red, 1 blue springs  Double leg on heels parallel and also in V Single leg press out 1 red 1 blue with opp leg in table top , arch is more comfortable Self Care: Neutral joint position Other practitioners to address concerns (Neuro, Allergist, other)    [facility] Adult PT Treatment:                                                DATE: 08/04/22 Therapeutic Exercise: Recumbent HR 74 at rest, up to 95 with level 2 flat road for 5 min  Supine breathing, Tr A x 10  Isometric ball squeeze x 10  Hold ball with adductors then with upper body: red band shoulder press, narrow grip overhead lift and ER about 10 each  TrA with heel slides x  10  TrA with bent knee fallouts unilateral 2 sets with ball under feel and then red band around thighs x 10  Tra with articulating bridge, red band  Sidelying clam x 10 x 2 with red band  Sidelying hip abduction with no band x 10 x 2  Modified quadruped UE lift x 5 added opposite hip extension x 5 (bird dog)   [facility] Adult PT Treatment:                                                DATE: 07/28/22 Therapeutic Exercise: Supine breathing, placed dynadisc under pelvis TrA with heel slides alternating progressed to 90/90 to knee extension  LTR between sets to reduce tension Isometric Pilates circle squeeze chest hgt  Added knee lift x 8 alternating side  TrA with bent knee fallouts unilateral , without issues  Tra with  articulating bridge, pt did well initially, coordination of breathing an issue  Sidelying clam x 15 no band Sidelying hip abduction with knee extension x 8, circles  Figure 4 after sets  Wall sit 30 sec x 3, added bilat. Shoulder movements with circle  Wall push ups elbows out to 45 and then triceps x 10 each     PATIENT EDUCATION:  Education details: HEP, POC, neutral spine, pilates, proprioception Person educated: Patient Education method: Explanation, Demonstration, Tactile cues, Verbal cues, and Handouts Education comprehension: verbalized understanding and needs further education     HOME EXERCISE PROGRAM:   Access Code: [email or web address] URL: [email or web address] Date: 08/18/2022 Prepared by: Raeford Razor  Exercises - Hooklying Transversus Abdominis Palpation  - 2 x daily - 7 x weekly - 2 sets - 10 reps - 10 hold - Supine 90/90 Abdominal Bracing  - 2 x daily - 7 x weekly - 1 sets - 3-5 reps - 30 hold - Supine 90/90 with Leg Extensions  - 1 x daily - 7 x weekly - 2 sets - 10 reps - 30 hold - Bent Knee Fallouts  - 1 x daily - 7 x weekly - 2 sets - 10 reps - 30 hold - Supine Bridge with Mini Swiss Ball Between Knees  - 2 x daily - 7 x weekly - 2 sets - 10 reps - 5 hold - Clamshell  - 1 x daily - 7 x weekly - 2 sets - 10 reps - 30 hold - Wall Quarter Squat  - 1 x daily - 7 x weekly - 1 sets - 5 reps - 30-60 hold - Supine Shoulder Horizontal Abduction with Resistance  - 1 x daily - 7 x weekly - 2 sets - 10 reps - 5 hold - Supine Shoulder External Rotation with Resistance  - 1 x daily - 7 x weekly - 2 sets - 10 reps - 5 hold - Sidelying Thoracic Rotation with Open Book  - 1 x daily - 7 x weekly - 1 sets - 3 reps - 30 hold  ASSESSMENT:   CLINICAL IMPRESSION: Patient is seeing PA today and will be asking for referrals for specialists, including cardiology, neuro and allergist.  She continues to "over think" a lot of her exercises.  Upper back, neck tension when supine and  performing LE , lower core exercises indicates deep core weakness and an overactive nervous system. This is improving overall.  Using breathing for rib pain was effective today.  She does breath work in the AM "to get her body ready for the day" .  Used cane today as her Rt hip and Rt knee were wobbly" and she notices this when moving slowly "meandering" and when standing long periods. Will cont POC, focusing on a calming yet challenging load to her proximal joints and deep core.   OBJECTIVE IMPAIRMENTS decreased activity tolerance, difficulty walking, decreased strength, dizziness, increased fascial restrictions, improper body mechanics, and pain.    ACTIVITY LIMITATIONS carrying, lifting, sitting, standing, squatting, and sleeping   PARTICIPATION LIMITATIONS: interpersonal relationship, shopping, community activity, occupation, and recreation   PERSONAL FACTORS Time since onset of injury/illness/exacerbation and 3+ comorbidities: migraines, POTS, hip surgery  are also affecting patient's functional outcome.    REHAB POTENTIAL: Excellent   CLINICAL DECISION MAKING: Evolving/moderate complexity   EVALUATION COMPLEXITY: Moderate     GOALS: Goals reviewed with patient? Yes     LONG TERM GOALS: Target date: 08/25/2022    Patient will be I with HEP upon discharge for core, hips, posture Baseline:  Goal status: ongoing    2.  Pt will be able to demonstrate optimal posture and body mechanics for basic lifting, ADLs to minimize joint  strain.  Baseline:  Goal status: ongoing    3.  Pt will be able to report greater stability in knees, hips with walking longer distances in the community  Baseline:  Goal status: ongoing    4.  Pt will be able to tolerate therapy session with other ADLs, usual work with no more than min increase in fatigue Baseline: still works through the fatigue Goal status: ongoing   5.  Patient will use tape or other external support as needed for joint stability when  needed for more demanding tasks  Baseline: compression, has used tape for her hip, knee  Goal status: ongoing    6.  Bristol Impact Scale score TBD Baseline:  Goal status: INITIAL     PLAN: PT FREQUENCY: 2x/week   PT DURATION: 6 weeks-8 weeks    PLANNED INTERVENTIONS: Therapeutic exercises, Therapeutic activity, Neuromuscular re-education, Balance training, Patient/Family education, Self Care, Joint mobilization, Dry Needling, Taping, Manual therapy, and Re-evaluation   PLAN FOR NEXT SESSION: check HEP. Add on to stab. Q-ped, Modify for UE pain. Try pilates footwork , add on to reformer.       Raeford Razor, PT 08/20/22 9:58 AM Phone: [phone] Fax: [phone]

## 2022-08-23 ENCOUNTER — Ambulatory Visit: Payer: BC Managed Care – PPO | Admitting: Physical Therapy

## 2022-08-23 ENCOUNTER — Encounter: Payer: BC Managed Care – PPO | Admitting: Physical Therapy

## 2022-08-25 ENCOUNTER — Other Ambulatory Visit: Payer: Self-pay | Admitting: Gynecologic Oncology

## 2022-08-25 ENCOUNTER — Ambulatory Visit (HOSPITAL_COMMUNITY)
Admission: RE | Admit: 2022-08-25 | Discharge: 2022-08-25 | Disposition: A | Discharge status: Home / Self Care | Payer: BC Managed Care – PPO | Source: Ambulatory Visit | Attending: Gynecologic Oncology | Admitting: Gynecologic Oncology

## 2022-08-25 ENCOUNTER — Inpatient Hospital Stay: Payer: BC Managed Care – PPO | Attending: Gynecologic Oncology

## 2022-08-25 DIAGNOSIS — Z124 Encounter for screening for malignant neoplasm of cervix: Secondary | ICD-10-CM | POA: Insufficient documentation

## 2022-08-25 DIAGNOSIS — Z8543 Personal history of malignant neoplasm of ovary: Secondary | ICD-10-CM | POA: Diagnosis not present

## 2022-08-25 DIAGNOSIS — D3912 Neoplasm of uncertain behavior of left ovary: Secondary | ICD-10-CM | POA: Insufficient documentation

## 2022-08-25 DIAGNOSIS — F909 Attention-deficit hyperactivity disorder, unspecified type: Secondary | ICD-10-CM | POA: Insufficient documentation

## 2022-08-25 DIAGNOSIS — Q796 Ehlers-Danlos syndrome, unspecified: Secondary | ICD-10-CM | POA: Insufficient documentation

## 2022-08-25 DIAGNOSIS — R5383 Other fatigue: Secondary | ICD-10-CM | POA: Insufficient documentation

## 2022-08-30 LAB — ANTI MULLERIAN HORMONE: ANTI-MULLERIAN HORMONE (AMH): 1.33 ng/mL

## 2022-08-30 LAB — INHIBIN B: Inhibin B: 8.7 pg/mL

## 2022-08-31 ENCOUNTER — Inpatient Hospital Stay (HOSPITAL_BASED_OUTPATIENT_CLINIC_OR_DEPARTMENT_OTHER): Payer: BC Managed Care – PPO | Admitting: Gynecologic Oncology

## 2022-08-31 ENCOUNTER — Other Ambulatory Visit: Payer: Self-pay

## 2022-08-31 ENCOUNTER — Other Ambulatory Visit (HOSPITAL_COMMUNITY)
Admission: RE | Admit: 2022-08-31 | Discharge: 2022-08-31 | Disposition: A | Discharge status: Home / Self Care | Payer: BC Managed Care – PPO | Source: Ambulatory Visit | Attending: Gynecologic Oncology | Admitting: Gynecologic Oncology

## 2022-08-31 ENCOUNTER — Encounter: Payer: Self-pay | Admitting: Gynecologic Oncology

## 2022-08-31 VITALS — BP 129/68 | HR 73 | Temp 98.6°F | Resp 16 | Ht 67.32 in | Wt 133.3 lb

## 2022-08-31 DIAGNOSIS — D3912 Neoplasm of uncertain behavior of left ovary: Secondary | ICD-10-CM

## 2022-08-31 DIAGNOSIS — Z124 Encounter for screening for malignant neoplasm of cervix: Secondary | ICD-10-CM

## 2022-08-31 DIAGNOSIS — Q796 Ehlers-Danlos syndrome, unspecified: Secondary | ICD-10-CM | POA: Diagnosis not present

## 2022-08-31 DIAGNOSIS — R5383 Other fatigue: Secondary | ICD-10-CM | POA: Diagnosis not present

## 2022-08-31 DIAGNOSIS — F909 Attention-deficit hyperactivity disorder, unspecified type: Secondary | ICD-10-CM | POA: Diagnosis not present

## 2022-08-31 NOTE — Progress Notes (Signed)
Gynecologic Oncology Return Clinic Visit  08/31/22  Reason for Visit: surveillance in the setting of granulosa cell tumor   Treatment History: Oncology History  Granulosa cell tumor of left ovary  10/18/2019 Imaging   Pelvic ultrasound: 11 x 7 x 9 cm left ovarian cyst, avascular with thin septation.  No free fluid.  Right ovary normal in appearance.   11/13/2019 Surgery   Laparoscopic left oophorectomy with collection of pelvic washings, lysis of adhesion.  Findings at the time of surgery were a 12 cm left ovarian cyst.  No ascites or intra-abdominal/pelvic disease.  Ovary was removed in a contained fashion and in an Endo Catch bag.   11/13/2019 Pathology Results   Left ovary showing granulosa cell tumor, 2.1 cm.  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 Pelvic washings negative for malignant cells.   11/13/2019 Initial Diagnosis   Granulosa cell tumor of left ovary   11/29/2019 Imaging   CT A/P: No acute findings in the abdomen or pelvis.  Specifically, no evidence for metastatic disease in the abdomen or pelvis. Trace free fluid in the cul-de-sac.  This can be physiologic in a premenopausal female.   12/27/2019 Tumor Marker   Patient's blood was tested for the following markers: Inh B, AMH Results of the tumor marker test revealed: 83, 1.07.     Interval History: Patient reports overall doing well.  Has recently been diagnosed with EDS as well as POTTS.  This has been quite helpful for her in terms of explaining many of the symptoms that she has been struggling with for some time.  Reports appetite continues to be low but thinks this is multi factorial and related to her ADHD as well as gut motility issues.  Continues to have some fatigue.  Denies abdominal or pelvic pain.  Menses have been regular but cycles occur now every 24 to 31 days.  Bleeding seems to be heavier than it was previously with passage of clots.  She endorses some urinary frequency.   Otherwise urinary function as well as bowels are at baseline.  Recovered well from hip surgery earlier this year.  Past Medical/Surgical History: Past Medical History:  Diagnosis Date   Abnormal Pap smear of cervix    Allergy    Anemia    suspected poor diet; vegetarian during teens   Anxiety    Asthma    childhood   Depression    Dysmenorrhea    Family history of breast cancer    Family history of kidney cancer    Granulosa cell tumor of ovary    History of iron deficiency anemia    age 36   Joint pain    Labral tear of right hip joint    Leukocytosis    Low vitamin D level    Migraine with aura    never on rx or saw neurology   Ovarian cancer ([place]) 2021   Ovarian cyst    Left   Pneumonia 01/2012    left lower lobe   Pre-diabetes 2015   had a "borderline" A1c   STD (sexually transmitted disease)    HSV1   Wears contact lenses    Wears glasses     Past Surgical History:  Procedure Laterality Date   BREAST BIOPSY Right 04/2020   benign   COLPOSCOPY     LAPAROSCOPIC OVARIAN CYSTECTOMY Left 11/13/2019   Procedure: LAPAROSCOPIC OVARIAN CYSTECTOMY/POSSIBLE LEFT OOPHORECTOMY/ COLLECTION OF PELVIC WASHINGS/ks;  Surgeon: Nunzio Cobbs, MD;  Location: [place];  Service: Gynecology;  Laterality: Left;  Possible left oophorectomy Colection of pelvic washings To follow first case of the day   LEFT OOPHORECTOMY Left 11/2019   right hip arthroscopy  01/2022   for laberal tear repair   TONSILLECTOMY     UPPER GI ENDOSCOPY      Family History  Problem Relation Age of Onset   Diabetes Mother    Hypertension Mother    Stroke Mother        TIA   Factor V Leiden deficiency Mother    Other Mother        pituitary tumor   Colon polyps Mother    Diverticulitis Mother    Kidney cancer Father        cancerous tumor of kidney   Ulcerative colitis Father    Other Brother        ear tumor, dx. age 110-6   Breast cancer Maternal Grandmother         dx. 80s   Stroke Maternal Grandfather    Rheum arthritis Maternal Great-grandmother    Cancer Other    Ovarian cancer Neg Hx    Endometrial cancer Neg Hx    Colon cancer Neg Hx     Social History   Socioeconomic History   Marital status: Married    Spouse name: Not on file   Number of children: 0   Years of education: Not on file   Highest education level: Not on file  Occupational History    Comment: part time [employer]  Tobacco Use   Smoking status: Former    Packs/day: 1.00    Types: Cigarettes   Smokeless tobacco: Never  Vaping Use   Vaping Use: Never used  Substance and Sexual Activity   Alcohol use: Not Currently   Drug use: No   Sexual activity: Yes    Partners: Male    Birth control/protection: Other-see comments, Surgical    Comment: vasectomy  Other Topics Concern   Not on file  Social History Narrative   Moved from [place]   Married   Working part-time in [employer] broadcast assistant Strain: Not on file  Food Insecurity: Not on file  Transportation Needs: Not on file  Physical Activity: Not on file  Stress: Not on file  Social Connections: Not on file    Current Medications:  Current Outpatient Medications:    busPIRone (BUSPAR) 5 MG tablet, Take 1 tablet (5 mg total) by mouth 3 (three) times daily as needed (anxiety)., Disp: 90 tablet, Rfl: 1   cetirizine (ZYRTEC) 10 MG tablet, Take 10 mg by mouth daily., Disp: , Rfl:    cyanocobalamin 1000 MCG tablet, Take 1,000 mcg by mouth daily., Disp: , Rfl:    escitalopram (LEXAPRO) 20 MG tablet, Take 1/2 tablet ('10mg'$ ) tablet by mouth day. (Patient taking differently: Take 1/2 tablet ('10mg'$ ) tablet by mouth daily for 2 weeks out of cycle), Disp: 45 tablet, Rfl: 1   magnesium oxide (MAG-OX) 400 (240 Mg) MG tablet, Take 400 mg by mouth daily., Disp: , Rfl:    Melatonin 10 MG TABS, Take 1 tablet by mouth at bedtime., Disp: , Rfl:    Multiple  Vitamins-Minerals (MULTIPLE VITAMINS/WOMENS PO), Take 1 tablet by mouth daily in the afternoon., Disp: , Rfl:    valACYclovir (VALTREX) 1000 MG tablet, Take 2 tablets (2000 mg) by mouth twice a day for  24 hours as needed for cold sore., Disp: 30 tablet, Rfl: 1  Review of Systems: + Appetite changes, fatigue, urinary frequency, joint pain. Denies fevers, chills, unexplained weight changes. Denies hearing loss, neck lumps or masses, mouth sores, ringing in ears or voice changes. Denies cough or wheezing.  Denies shortness of breath. Denies chest pain or palpitations. Denies leg swelling. Denies abdominal distention, pain, blood in stools, constipation, diarrhea, nausea, vomiting, or early satiety. Denies pain with intercourse, dysuria, hematuria or incontinence. Denies hot flashes, pelvic pain, vaginal bleeding or vaginal discharge.   Denies back pain or muscle pain/cramps. Denies itching, rash, or wounds. Denies dizziness, headaches, numbness or seizures. Denies swollen lymph nodes or glands, denies easy bruising or bleeding. Denies anxiety, depression, confusion, or decreased concentration.  Physical Exam: BP 129/68 (BP Location: Left Arm, Patient Position: Sitting)   Pulse 73   Temp 98.6 F (37 C) (Oral)   Resp 16   Ht 5' 7.32" (1.71 m)   Wt 133 lb 4.8 oz (60.5 kg)   LMP 08/04/2022 (Exact Date)   SpO2 97%   BMI 20.68 kg/m  General: Alert, oriented, no acute distress. HEENT: Normocephalic, atraumatic, sclera anicteric. Chest: Clear to auscultation bilaterally.  No wheezes or rhonchi. Cardiovascular: Regular rate and rhythm, no murmurs. Abdomen: soft, nontender.  Normoactive bowel sounds.  No masses or hepatosplenomegaly appreciated.  Well-healed incisions. Extremities: Grossly normal range of motion.  Warm, well perfused.  No edema bilaterally. Skin: No rashes or lesions noted. Lymphatics: No cervical, supraclavicular, or inguinal adenopathy. GU: Normal appearing external  genitalia without erythema, excoriation, or lesions.  Speculum exam reveals well rugated vaginal mucosa, no lesions or masses.  Cervix is normal in appearance.  Pap and HPV collected.  Bimanual exam reveals small mobile uterus, no adnexal masses appreciated, no nodularity.  Rectovaginal exam confirms these findings.  No hemorrhoids noted externally.  No rectal masses or internal hemorrhoids palpated.  Laboratory & Radiologic Studies: Component Ref Range & Units 6 d ago (08/25/22) 7 mo ago (01/11/22) 1 yr ago (08/10/21) 1 yr ago (02/13/21) 2 yr ago (08/18/20) 2 yr ago (02/20/20) 2 yr ago (11/22/19)  Inhibin B pg/mL 8.7 56.0 CM 32.7 CM 48.0 CM 71.0 CM 14.9 CM 83.0 CM             Component Ref Range & Units 6 d ago (08/25/22) 7 mo ago (01/11/22) 1 yr ago (08/10/21) 1 yr ago (02/13/21) 2 yr ago (08/18/20) 2 yr ago (02/20/20) 2 yr ago (11/22/19)  ANTI-MULLERIAN HORMONE (AMH) ng/mL 1.33 1.70 CM 0.471 CM 2.57 CM 3.01 CM 2.08 CM 1.07 CM     Pelvic ultrasound 08/25/22: FINDINGS: Uterus   Measurements: 8.7 x 4.9 x 5.6 cm = volume: 122.3 mL. No fibroids or other mass visualized.   Endometrium   Thickness: 1.1 mm.  No focal abnormality visualized.   Right ovary   Measurements: 3.7 x 1.8 x 3.2 cm = volume: 11.4 mL. Normal appearance/no adnexal mass.   Left ovary   Surgically absent   Other findings   No abnormal free fluid.   IMPRESSION: 1. No acute process within the pelvis.  Assessment & Plan: Kathryn Park is a 36 y.o. 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   Patient is overall doing well.  Recent lab work was overall reassuring.  Last ultrasound shows unremarkable right ovary.  Patient has had some medical diagnoses since I saw her that help explain many of her  GI symptoms and other systemic symptoms.  She is in the process of learning more about these diagnoses and has been referred to multiple  subspecialists.  Cervical cancer screening was updated today.  We discussed consideration of progestin therapy in the setting of her menstrual changes.  She has migraines with aura and I discussed that I would not recommend combined oral contraceptive pills.  At this point, the patient would like to hold off on starting any new medication.   We reviewed signs and symptoms again that would be concerning for disease recurrence.  Per [facility] surveillance recommendations, we will continue on visits every 6 months.  She knows to call if she develops any symptoms before her next scheduled visit.  I will plan to repeat labs at her next visit.  No routine imaging ordered at this time.  We discussed that typically, we would image for change in symptoms or tumor marker.  22 minutes of total time was spent for this patient encounter, including preparation, face-to-face counseling with the patient and coordination of care, and documentation of the encounter.  Jeral Pinch, MD  Division of Gynecologic Oncology  Department of Obstetrics and Gynecology  [facility]

## 2022-08-31 NOTE — Patient Instructions (Signed)
It was good to see you today.  I do not see or feel any evidence of cancer recurrence on your exam.  I will see you for follow-up in 6 months.  As always, if you develop any new and concerning symptoms before your next visit, please call to see me sooner.   

## 2022-08-31 NOTE — Therapy (Addendum)
OUTPATIENT PHYSICAL THERAPY TREATMENT NOTE DISCHARGE   Patient Name: Kathryn Park MRN: [id number] DOB:1986/01/13, 36 y.o., female Today's Date: 09/01/2022  PCP: [name], [place]   REFERRING PROVIDER: [name], [place] Dr. [name]  END OF SESSION:   PT End of Session - 09/01/22 0854     Visit Number 8    Number of Visits 12    Date for PT Re-Evaluation 09/29/22    PT Start Time 0852    PT Stop Time 0930    PT Time Calculation (min) 38 min    Activity Tolerance Patient tolerated treatment well    Behavior During Therapy Anxious;WFL for tasks assessed/performed                   Past Medical History:  Diagnosis Date   Abnormal Pap smear of cervix    Allergy    Anemia    suspected poor diet; vegetarian during teens   Anxiety    Asthma    childhood   Depression    Dysmenorrhea    Family history of breast cancer    Family history of kidney cancer    Granulosa cell tumor of ovary    History of iron deficiency anemia    age 74   Joint pain    Labral tear of right hip joint    Leukocytosis    Low vitamin D level    Migraine with aura    never on rx or saw neurology   Ovarian cancer ([place]) 2021   Ovarian cyst    Left   Pneumonia 01/2012    left lower lobe   Pre-diabetes 2015   had a "borderline" A1c   STD (sexually transmitted disease)    HSV1   Wears contact lenses    Wears glasses    Past Surgical History:  Procedure Laterality Date   BREAST BIOPSY Right 04/2020   benign   COLPOSCOPY     LAPAROSCOPIC OVARIAN CYSTECTOMY Left 11/13/2019   Procedure: LAPAROSCOPIC OVARIAN CYSTECTOMY/POSSIBLE LEFT OOPHORECTOMY/ COLLECTION OF PELVIC WASHINGS/ks;  Surgeon: Nunzio Cobbs, MD;  Location[facility];  Service: Gynecology;  Laterality: Left;  Possible left oophorectomy Colection of pelvic washings To follow first case of the day   LEFT OOPHORECTOMY Left 11/2019   right hip arthroscopy  01/2022   for  laberal tear repair   TONSILLECTOMY     UPPER GI ENDOSCOPY     Patient Active Problem List   Diagnosis Date Noted   EDS (Ehlers-Danlos syndrome) 06/10/2022   Tear of right acetabular labrum 07/22/2021   Breast lump 02/12/2021   Bilateral hip pain 11/25/2020   Positive ANA (antinuclear antibody) 11/25/2020   Other fatigue 11/25/2020   Bladder retention 10/13/2020   HSV-1 (herpes simplex virus 1) infection 10/13/2020   History of anorexia nervosa 10/13/2020   GAD (generalized anxiety disorder) 10/13/2020   Family history of breast cancer    Family history of kidney cancer    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    REFERRING DIAG: [name] syndrome    THERAPY DIAG:  Muscle weakness (generalized)  Abnormal posture  Unspecified lack of coordination  Ehlers-Danlos syndrome type III  Polyarthralgia  Other muscle spasm  Hypermobility syndrome  Rationale for Evaluation and Treatment Rehabilitation  PERTINENT HISTORY: Rt hip labral repair Ovarian cancer POTS Migraines   surgery on by Dr. Aretha Parrot on 01/21/22.  PRECAUTIONS: Other: Ligament laxity/joint instability , POTS, monitor symptoms  SUBJECTIVE STATEMENT: Walks in with cane, has consisently used it when not home.  Rt leg has been "dippy' , giving way when she slows down or pivots.  I make fists at night and I think that's why. Seeing Allergist and Neurologist end of Dec.    PAIN:  Are you having pain? not today : NPRS scale: mild in hands  Pain location:hip and R shoulder  Pain description: sore Aggravating factors: hip- prolonged posiitoning  Relieving factors: heat, rest, yoga ball , movement    OBJECTIVE: (objective measures completed at initial evaluation unless otherwise dated)   DIAGNOSTIC FINDINGS: NONE    PATIENT  SURVEYS:  BIS survey given on eval    COGNITION:            Overall cognitive status: Within functional limits for tasks assessed                          SENSATION: WFL   EDEMA:  NA   MUSCLE LENGTH: Hamstrings: SLR to 90 deg  Thomas test: NT   POSTURE:  mild forward head, normal spinal alignment    PALPATION: Grossly TTP in upper mid and lower back, hips Patient met criteria for mild skin hyperextensibility, stretch marks, bilateral Pisa genic papules of the heel, history of dental crowding, and arachnodactyly.   Beighton Scale Lumbar (1_/1) Knees (_1/2) Elbows (2_/2)  5th digit (1/_2) Thumb (_2/2)   7/9     LOWER EXTREMITY ROM:   Active ROM Right eval Left eval  Hip flexion WNL WNL  Hip extension      Hip abduction      Hip adduction      Hip internal rotation hyper hyper  Hip external rotation Relatively tight but [facility]  Knee flexion [facility] WFL  Knee extension hyper WFL  Ankle dorsiflexion      Ankle plantarflexion      Ankle inversion      Ankle eversion       (Blank rows = not tested)   LOWER EXTREMITY MMT:   MMT Right eval Left eval  Hip flexion 5 5  Hip extension 5 5  Hip abduction 4 4  Hip adduction      Hip internal rotation      Hip external rotation      Knee flexion 5 5  Knee extension 5 5  Ankle dorsiflexion 5 5  Ankle plantarflexion      Ankle inversion      Ankle eversion       (Blank rows = not tested)   LOWER EXTREMITY SPECIAL TESTS:  NT   FUNCTIONAL TESTS:                            SLS WNL with focus Good squat with min cues, good depth, min hip discomfort    GAIT: Distance walked: 150 Assistive device utilized: None Level of assistance: Complete Independence Comments: no deviations  [facility] Adult PT Treatment:                                                DATE: 09/01/22 Therapeutic Exercise: Supine core exercises, used towel folded under hips to increase posterior pelvic tilt: march from table, 90/90 toe taps about  x 15  Supine bent knee fall  out blue band x 15 alternating  Partial bridge with small blue band x 10 clam  Bridge with band x 10 with small march to table top Sit to stand with blue band x 10 no UEs  Hip abduction  blue band at thighs x 15 each LE holding chair 2nd set band on lower legs x 10  Hip flexion blue looped band at feet (90 deg march) x 10 each side  Quadruped shoulder work: mid back Neurosurgeon and cow, lat stretch   [facility] Adult PT Treatment:                                                DATE: 08/18/22 Therapeutic Exercise: Supine banded knee extension red looped band x 30-40 sec each  Ball squeeze adduction with abdominal draw in x 10  Bridging with articulation x 8 then flat back x 8 Single leg small amplitude bridge x 6 each side  Lower ab march on ball , pain in Rt rib cage Open book x 3 each side , breathing  Lower ab march without ball , less pain 90/90 hold  90/90 hold with alternating shoulder flexion x 10  90/90 knee extension x 10 alternating , added red band to reduce instability in hips   Self Care: Tips for glute activation HEP progression    [facility] Adult PT Treatment:                                                DATE: 08/11/22 Therapeutic Exercise: NuStep smaller ROM, level 3 UE and LE for 5 min Reformer: Seated Arms facing back/Facing forward Row 1 red spring x 10  Extension 1 blue spring x 10  External rotation 1 blue bilateral x 10 Horizontal abd yellow band x 10  Serving (shoulder flexion) 1 yellow x 8  Hug a tree yellow x 8  Footwork 2 red, 1 blue springs  Double leg on heels parallel and also in V Single leg press out 1 red 1 blue with opp leg in table top , arch is more comfortable Self Care: Neutral joint position Other practitioners to address concerns (Neuro, Allergist, other)    [facility] Adult PT Treatment:                                                DATE: 08/04/22 Therapeutic Exercise: Recumbent HR 74 at rest, up to 95 with level 2 flat road  for 5 min  Supine breathing, Tr A x 10  Isometric ball squeeze x 10  Hold ball with adductors then with upper body: red band shoulder press, narrow grip overhead lift and ER about 10 each  TrA with heel slides x  10  TrA with bent knee fallouts unilateral 2 sets with ball under feel and then red band around thighs x 10  Tra with articulating bridge, red band  Sidelying clam x 10 x 2 with red band  Sidelying hip abduction with no band x 10 x 2  Modified quadruped UE lift x 5 added opposite hip extension x 5 (bird dog)  [facility] Adult PT Treatment:                                                DATE: 07/28/22 Therapeutic Exercise: Supine breathing, placed dynadisc under pelvis TrA with heel slides alternating progressed to 90/90 to knee extension  LTR between sets to reduce tension Isometric Pilates circle squeeze chest hgt  Added knee lift x 8 alternating side  TrA with bent knee fallouts unilateral , without issues  Tra with articulating bridge, pt did well initially, coordination of breathing an issue  Sidelying clam x 15 no band Sidelying hip abduction with knee extension x 8, circles  Figure 4 after sets  Wall sit 30 sec x 3, added bilat. Shoulder movements with circle  Wall push ups elbows out to 45 and then triceps x 10 each     PATIENT EDUCATION:  Education details: HEP, POC, neutral spine, pilates, proprioception Person educated: Patient Education method: Explanation, Demonstration, Tactile cues, Verbal cues, and Handouts Education comprehension: verbalized understanding and needs further education     HOME EXERCISE PROGRAM:   Access Code: [email or web address] URL: [email or web address] Date: 08/18/2022 Prepared by: Raeford Razor  Exercises - Hooklying Transversus Abdominis Palpation  - 2 x daily - 7 x weekly - 2 sets - 10 reps - 10 hold - Supine 90/90 Abdominal Bracing  - 2 x daily - 7 x weekly - 1 sets - 3-5 reps - 30 hold - Supine 90/90 with Leg Extensions  - 1 x  daily - 7 x weekly - 2 sets - 10 reps - 30 hold - Bent Knee Fallouts  - 1 x daily - 7 x weekly - 2 sets - 10 reps - 30 hold - Supine Bridge with Mini Swiss Ball Between Knees  - 2 x daily - 7 x weekly - 2 sets - 10 reps - 5 hold - Clamshell  - 1 x daily - 7 x weekly - 2 sets - 10 reps - 30 hold - Wall Quarter Squat  - 1 x daily - 7 x weekly - 1 sets - 5 reps - 30-60 hold - Supine Shoulder Horizontal Abduction with Resistance  - 1 x daily - 7 x weekly - 2 sets - 10 reps - 5 hold - Supine Shoulder External Rotation with Resistance  - 1 x daily - 7 x weekly - 2 sets - 10 reps - 5 hold - Sidelying Thoracic Rotation with Open Book  - 1 x daily - 7 x weekly - 1 sets - 3 reps - 30 hold  ASSESSMENT:   CLINICAL IMPRESSION:  Patient with good muscle fatigue with standing exercises.  She continues to over think some of the core exercises and have upper body tension during core work.  She shows excellent form and proprioception. After a fatiguing set she has small involuntary twitches.    OBJECTIVE IMPAIRMENTS decreased activity tolerance, difficulty walking, decreased strength, dizziness, increased fascial restrictions, improper body mechanics, and pain.    ACTIVITY LIMITATIONS carrying, lifting, sitting, standing, squatting, and sleeping   PARTICIPATION LIMITATIONS: interpersonal relationship, shopping, community activity, occupation, and recreation   PERSONAL FACTORS Time since onset of injury/illness/exacerbation and 3+ comorbidities: migraines, POTS, hip surgery  are also affecting patient's functional outcome.    REHAB POTENTIAL: Excellent   CLINICAL DECISION MAKING: Evolving/moderate complexity   EVALUATION COMPLEXITY: Moderate  GOALS: Goals reviewed with patient? Yes     LONG TERM GOALS: Target date: 08/25/2022    Patient will be I with HEP upon discharge for core, hips, posture Baseline:  Goal status: ongoing    2.  Pt will be able to demonstrate optimal posture and body  mechanics for basic lifting, ADLs to minimize joint strain.  Baseline:  Goal status: ongoing    3.  Pt will be able to report greater stability in knees, hips with walking longer distances in the community  Baseline:  Goal status: ongoing    4.  Pt will be able to tolerate therapy session with other ADLs, usual work with no more than min increase in fatigue Baseline: still works through the fatigue Goal status: ongoing   5.  Patient will use tape or other external support as needed for joint stability when needed for more demanding tasks  Baseline: compression, has used tape for her hip, knee  Goal status: ongoing    6.  Bristol Impact Scale score TBD Baseline:  Goal status: INITIAL     PLAN: PT FREQUENCY: 2x/week   PT DURATION: 6 weeks-8 weeks    PLANNED INTERVENTIONS: Therapeutic exercises, Therapeutic activity, Neuromuscular re-education, Balance training, Patient/Family education, Self Care, Joint mobilization, Dry Needling, Taping, Manual therapy, and Re-evaluation   PLAN FOR NEXT SESSION: check HEP. Add on to stab. Q-ped, Modify for UE pain. Try pilates footwork , add on to reformer.       Raeford Razor, PT 09/01/22 8:55 AM Phone: [phone] Fax: [phone]   PHYSICAL THERAPY DISCHARGE SUMMARY  Visits from Start of Care: 8  Current functional level related to goals / functional outcomes: See above    Remaining deficits: unknown   Education / Equipment: HEP   Patient agrees to discharge. Patient goals were not met. Patient is being discharged due to not returning since the last visit.  Raeford Razor, PT 12/10/22 11:50 AM Phone: [phone] Fax: [phone]

## 2022-09-01 ENCOUNTER — Encounter: Payer: BC Managed Care – PPO | Admitting: Physical Therapy

## 2022-09-01 ENCOUNTER — Encounter: Payer: Self-pay | Admitting: Physical Therapy

## 2022-09-01 ENCOUNTER — Ambulatory Visit: Payer: BC Managed Care – PPO | Admitting: Physical Therapy

## 2022-09-01 DIAGNOSIS — R293 Abnormal posture: Secondary | ICD-10-CM | POA: Diagnosis not present

## 2022-09-01 DIAGNOSIS — M2550 Pain in unspecified joint: Secondary | ICD-10-CM | POA: Diagnosis not present

## 2022-09-01 DIAGNOSIS — R279 Unspecified lack of coordination: Secondary | ICD-10-CM

## 2022-09-01 DIAGNOSIS — M6281 Muscle weakness (generalized): Secondary | ICD-10-CM

## 2022-09-01 DIAGNOSIS — M62838 Other muscle spasm: Secondary | ICD-10-CM | POA: Diagnosis not present

## 2022-09-01 DIAGNOSIS — M357 Hypermobility syndrome: Secondary | ICD-10-CM | POA: Diagnosis not present

## 2022-09-01 DIAGNOSIS — Q7962 Hypermobile Ehlers-Danlos syndrome: Secondary | ICD-10-CM | POA: Diagnosis not present

## 2022-09-03 DIAGNOSIS — F411 Generalized anxiety disorder: Secondary | ICD-10-CM | POA: Diagnosis not present

## 2022-09-03 DIAGNOSIS — F321 Major depressive disorder, single episode, moderate: Secondary | ICD-10-CM | POA: Diagnosis not present

## 2022-09-03 LAB — CYTOLOGY - PAP
Comment: NEGATIVE
Diagnosis: NEGATIVE
High risk HPV: NEGATIVE

## 2022-09-08 ENCOUNTER — Encounter: Payer: BC Managed Care – PPO | Admitting: Physical Therapy

## 2022-09-10 DIAGNOSIS — F411 Generalized anxiety disorder: Secondary | ICD-10-CM | POA: Diagnosis not present

## 2022-09-10 DIAGNOSIS — F321 Major depressive disorder, single episode, moderate: Secondary | ICD-10-CM | POA: Diagnosis not present

## 2022-09-17 ENCOUNTER — Other Ambulatory Visit: Payer: Self-pay

## 2022-09-17 DIAGNOSIS — F321 Major depressive disorder, single episode, moderate: Secondary | ICD-10-CM | POA: Diagnosis not present

## 2022-09-17 DIAGNOSIS — F411 Generalized anxiety disorder: Secondary | ICD-10-CM | POA: Diagnosis not present

## 2022-09-17 NOTE — Telephone Encounter (Signed)
BS pt calling to request refills on Rx (s) in triage VM and also requested for Rx (s) to be sent to a different pharmacy.   Returned call and pt did not answer, left detailed msg per DPR requesting her to advise [place] of which med she needed a refill of and to send to which pharmacy.

## 2022-09-20 NOTE — Telephone Encounter (Signed)
Please confirm the dosage and frequency that patient is taking Lexapro, so I can refill it properly. Is she taking 10 mg daily for 2 weeks per month or is she taking it daily every day of the month?

## 2022-09-20 NOTE — Telephone Encounter (Signed)
Patient called back stating she needs Lexapro 20 mg tablet sent to [facility]. Patient said she is having a hard time getting in contact with online pharmacy [facility].   Last annual exam 11/2021, no exam scheduled yet.

## 2022-09-22 NOTE — Telephone Encounter (Signed)
Please confirm the dosage and the frequency that the patient is taking the Lexapro.  10 mg daily every day or 10 mg daily for 2 weeks per month?

## 2022-09-22 NOTE — Telephone Encounter (Signed)
Left message for patient to call.

## 2022-09-24 ENCOUNTER — Encounter: Payer: Self-pay | Admitting: Diagnostic Neuroimaging

## 2022-09-24 ENCOUNTER — Ambulatory Visit: Payer: BC Managed Care – PPO | Admitting: Diagnostic Neuroimaging

## 2022-09-24 VITALS — BP 125/77 | HR 72 | Ht 64.0 in | Wt 131.4 lb

## 2022-09-24 DIAGNOSIS — F411 Generalized anxiety disorder: Secondary | ICD-10-CM | POA: Diagnosis not present

## 2022-09-24 DIAGNOSIS — M62838 Other muscle spasm: Secondary | ICD-10-CM

## 2022-09-24 DIAGNOSIS — F321 Major depressive disorder, single episode, moderate: Secondary | ICD-10-CM | POA: Diagnosis not present

## 2022-09-24 DIAGNOSIS — R4189 Other symptoms and signs involving cognitive functions and awareness: Secondary | ICD-10-CM | POA: Diagnosis not present

## 2022-09-24 NOTE — Progress Notes (Signed)
[facility]  PATIENT: Kathryn Park DOB: July 28, 1986  REFERRING CLINICIAN: [name], PA HISTORY FROM: PATIENT AND HUSBAND REASON FOR VISIT: NEW CONSULT   HISTORICAL  CHIEF COMPLAINT:  Chief Complaint  Patient presents with   New Patient (Initial Visit)    Patient in rm 6 with husband. Patient reports hx of Ehlers Danlos, with gait problems and involuntary muscle movement, brain fog, tingling in hands and arm more so, but also occur in feet. Patient states she has had problems with grip. Patient states the symptoms have been there for years but have become more impeding the last couple of years. Pt walks with cane to help with the muscles movement.     HISTORY OF PRESENT ILLNESS:   36 year old female here for abnormal sensations.  Since 01/06/2020 patient has had increasing fatigue, brain fog, word recall problems.  Also was having decreased appetite, unintentional weight loss, abnormal sensation and pain and pressure.  Around that time she was diagnosed with ovarian cancer and treated with surgery.  She was having increasing stress and anxiety around that time related to her medical issues.  She was also had an caregiver for her mother who suffered from stroke and dementia, passed away in 01/06/2019. Has a history of anxiety back to high school.  Also has ADHD since young age.  Has more recent diagnosis of Ehlers-Danlos syndrome in October 2023 related to joint hypermobility.  Patient had right hip labral tear and treated with surgery in April 2023.  In summer 2023 started to have issues of involuntary muscle jerks and spasms in her arms, legs, torso.  Sometimes she would have a "dip" sensation where her muscle tone would suddenly give out and she would almost fall.  This seemed to happen randomly without specific triggers.  No remote history of motor tics or similar muscle spasms when she was younger.   REVIEW OF SYSTEMS: Full 14 system review of  systems performed and negative with exception of: as per HPI.  ALLERGIES: Allergies  Allergen Reactions   Shellfish Allergy Anaphylaxis    Throat closes   Other Other (See Comments)    Watermelon, mold/mildew, pet dander, dust and dust mites  Swelling of eyes and congestion   Latex Rash   Sulfa Antibiotics Rash    HOME MEDICATIONS: Outpatient Medications Prior to Visit  Medication Sig Dispense Refill   busPIRone (BUSPAR) 5 MG tablet Take 1 tablet (5 mg total) by mouth 3 (three) times daily as needed (anxiety). 90 tablet 1   cyanocobalamin 1000 MCG tablet Take 1,000 mcg by mouth daily.     escitalopram (LEXAPRO) 20 MG tablet Take 1/2 tablet ('10mg'$ ) tablet by mouth day. (Patient taking differently: Take 1/2 tablet ('10mg'$ ) tablet by mouth daily for 2 weeks out of cycle) 45 tablet 1   magnesium oxide (MAG-OX) 400 (240 Mg) MG tablet Take 400 mg by mouth daily.     Melatonin 10 MG TABS Take 1 tablet by mouth at bedtime.     Multiple Vitamins-Minerals (MULTIPLE VITAMINS/WOMENS PO) Take 1 tablet by mouth daily in the afternoon.     valACYclovir (VALTREX) 1000 MG tablet Take 2 tablets (2000 mg) by mouth twice a day for 24 hours as needed for cold sore. 30 tablet 1   cetirizine (ZYRTEC) 10 MG tablet Take 10 mg by mouth daily. (Patient not taking: Reported on 09/24/2022)     No facility-administered medications prior to visit.    PAST MEDICAL HISTORY: Past Medical History:  Diagnosis  Date   Abnormal Pap smear of cervix    Allergy    Anemia    suspected poor diet; vegetarian during teens   Anxiety    Asthma    childhood   Depression    Dysmenorrhea    Family history of breast cancer    Family history of kidney cancer    Granulosa cell tumor of ovary    History of iron deficiency anemia    age 65   Joint pain    Labral tear of right hip joint    Leukocytosis    Low vitamin D level    Migraine with aura    never on rx or saw neurology   Ovarian cancer ([place]) 2021   Ovarian cyst     Left   Pneumonia 01/2012    left lower lobe   Pre-diabetes 2015   had a "borderline" A1c   STD (sexually transmitted disease)    HSV1   Wears contact lenses    Wears glasses     PAST SURGICAL HISTORY: Past Surgical History:  Procedure Laterality Date   BREAST BIOPSY Right 04/2020   benign   COLPOSCOPY     LAPAROSCOPIC OVARIAN CYSTECTOMY Left 11/13/2019   Procedure: LAPAROSCOPIC OVARIAN CYSTECTOMY/POSSIBLE LEFT OOPHORECTOMY/ COLLECTION OF PELVIC WASHINGS/ks;  Surgeon: Nunzio Cobbs, MD;  Location: [facility];  Service: Gynecology;  Laterality: Left;  Possible left oophorectomy Colection of pelvic washings To follow first case of the day   LEFT OOPHORECTOMY Left 11/2019   right hip arthroscopy  01/2022   for laberal tear repair   TONSILLECTOMY     UPPER GI ENDOSCOPY      FAMILY HISTORY: Family History  Problem Relation Age of Onset   Diabetes Mother    Hypertension Mother    Stroke Mother        TIA   Factor V Leiden deficiency Mother    Other Mother        pituitary tumor   Colon polyps Mother    Diverticulitis Mother    Kidney cancer Father        cancerous tumor of kidney   Ulcerative colitis Father    Other Brother        ear tumor, dx. age 80-6   Breast cancer Maternal Grandmother        dx. 76s   Stroke Maternal Grandfather    Rheum arthritis Maternal Great-grandmother    Cancer Other    Ovarian cancer Neg Hx    Endometrial cancer Neg Hx    Colon cancer Neg Hx     SOCIAL HISTORY: Social History   Socioeconomic History   Marital status: Married    Spouse name: Not on file   Number of children: 0   Years of education: Not on file   Highest education level: Not on file  Occupational History    Comment: part time [employer]  Tobacco Use   Smoking status: Former    Packs/day: 1.00    Types: Cigarettes   Smokeless tobacco: Never  Vaping Use   Vaping Use: Never used  Substance and Sexual Activity   Alcohol use: Not  Currently   Drug use: No   Sexual activity: Yes    Partners: Male    Birth control/protection: Other-see comments, Surgical    Comment: vasectomy  Other Topics Concern   Not on file  Social History Narrative   Moved from [place]  Married   Working part-time in [employer] Strain: Not on file  Food Insecurity: Not on file  Transportation Needs: Not on file  Physical Activity: Not on file  Stress: Not on file  Social Connections: Not on file  Intimate Partner Violence: Not on file     PHYSICAL EXAM  GENERAL EXAM/CONSTITUTIONAL: Vitals:  Vitals:   09/24/22 0853  BP: 125/77  Pulse: 72  Weight: 131 lb 6.4 oz (59.6 kg)  Height: '5\' 4"'$  (1.626 m)   Body mass index is 22.55 kg/m. Wt Readings from Last 3 Encounters:  09/24/22 131 lb 6.4 oz (59.6 kg)  08/31/22 133 lb 4.8 oz (60.5 kg)  08/18/22 130 lb 8 oz (59.2 kg)   Patient is in no distress; well developed, nourished and groomed; neck is supple  CARDIOVASCULAR: Examination of carotid arteries is normal; no carotid bruits Regular rate and rhythm, no murmurs Examination of peripheral vascular system by observation and palpation is normal  EYES: Ophthalmoscopic exam of optic discs and posterior segments is normal; no papilledema or hemorrhages No results found.  MUSCULOSKELETAL: Gait, strength, tone, movements noted in Neurologic exam below  NEUROLOGIC: MENTAL STATUS:      No data to display         awake, alert, oriented to person, place and time recent and remote memory intact normal attention and concentration language fluent, comprehension intact, naming intact fund of knowledge appropriate  CRANIAL NERVE:  2nd - no papilledema on fundoscopic exam 2nd, 3rd, 4th, 6th - pupils equal and reactive to light, visual fields full to confrontation, extraocular muscles intact, no nystagmus 5th - facial sensation symmetric 7th -  facial strength symmetric 8th - hearing intact 9th - palate elevates symmetrically, uvula midline 11th - shoulder shrug symmetric 12th - tongue protrusion midline  MOTOR:  normal bulk and tone, full strength in the BUE, BLE INTERMITTENT MUSCLE JERKS IN ARMS, LEGS, HEAD; INTERMITTENT LOSS OF TONE IN EXTREMITIES  SENSORY:  normal and symmetric to light touch, temperature, vibration  COORDINATION:  finger-nose-finger, fine finger movements normal  REFLEXES:  deep tendon reflexes 1+ and symmetric  GAIT/STATION:  narrow based gait; USING CANE     DIAGNOSTIC DATA (LABS, IMAGING, TESTING) - I reviewed patient records, labs, notes, testing and imaging myself where available.  Lab Results  Component Value Date   WBC 9.4 02/23/2022   HGB 13.3 02/23/2022   HCT 39.3 02/23/2022   MCV 86.5 02/23/2022   PLT 203.0 02/23/2022      Component Value Date/Time   NA 141 12/23/2021 1507   NA 139 10/11/2019 1013   K 4.0 12/23/2021 1507   CL 104 12/23/2021 1507   CO2 31 12/23/2021 1507   GLUCOSE 82 12/23/2021 1507   BUN 9 12/23/2021 1507   BUN 12 10/11/2019 1013   CREATININE 0.72 12/23/2021 1507   CREATININE 0.75 09/04/2020 1150   CALCIUM 9.0 12/23/2021 1507   PROT 6.3 12/23/2021 1507   PROT 6.8 10/11/2019 1013   ALBUMIN 4.4 12/23/2021 1507   ALBUMIN 4.6 10/11/2019 1013   AST 14 12/23/2021 1507   AST 11 (L) 09/04/2020 1150   ALT 11 12/23/2021 1507   ALT 8 09/04/2020 1150   ALKPHOS 46 12/23/2021 1507   BILITOT 0.3 12/23/2021 1507   BILITOT 0.3 09/04/2020 1150   GFRNONAA >60 09/04/2020 1150   GFRAA 106 10/11/2019 1013   Lab Results  Component Value Date   CHOL 142 10/11/2019  HDL 64 10/11/2019   LDLCALC 70 10/11/2019   TRIG 29 10/11/2019   CHOLHDL 2.2 10/11/2019   Lab Results  Component Value Date   HGBA1C 5.6 10/13/2020   Lab Results  Component Value Date   VITAMINB12 334 07/06/2021   Lab Results  Component Value Date   TSH 3.80 07/06/2021        ASSESSMENT AND PLAN  36 y.o. year old female here with:   Dx:  1. Muscle spasm   2. Brain fog      PLAN:  MUSCLE TWITCHES, BRAIN FOG, TINGLING, LOSS OF MUSCLE TONE, DECR APPETITE (also history of anxiety, ADHD, EDS; will proceed with demyelinating dz and neuromuscular screening workup) - check workup --> MRI brain, cervical spine, labs  Orders Placed This Encounter  Procedures   MR BRAIN W WO CONTRAST   MR CERVICAL SPINE W WO CONTRAST   Vitamin B12   Hemoglobin A1c   TSH   CK   Aldolase   T4, Free   Return for pending test results, pending if symptoms worsen or fail to improve.    Penni Bombard, MD 38/33/3832, 9:19 AM Certified in Neurology, Neurophysiology and Neuroimaging  [facility] [address] [place], [place] [address] [phone]

## 2022-09-25 LAB — VITAMIN B12: Vitamin B-12: 757 pg/mL (ref 232–1245)

## 2022-09-25 LAB — T4, FREE: Free T4: 1.3 ng/dL (ref 0.82–1.77)

## 2022-09-25 LAB — ALDOLASE: Aldolase: 6.3 U/L (ref 3.3–10.3)

## 2022-09-25 LAB — TSH: TSH: 1.49 u[IU]/mL (ref 0.450–4.500)

## 2022-09-25 LAB — CK: Total CK: 79 U/L (ref 32–182)

## 2022-09-25 LAB — HEMOGLOBIN A1C
Est. average glucose Bld gHb Est-mCnc: 114 mg/dL
Hgb A1c MFr Bld: 5.6 % (ref 4.8–5.6)

## 2022-09-28 NOTE — Progress Notes (Unsigned)
New Patient Note  RE: Kathryn Park MRN: [id number] DOB: 1985/12/05 Date of Office Visit: 09/29/2022  Consult requested by: [name], [place] Primary care provider: [name], [place]  Chief Complaint: Pruritus, Rash, Nasal Congestion, Wheezing, and Sinusitis  History of Present Illness: I had the pleasure of seeing Kathryn Park for initial evaluation at the [facility] on 09/29/2022. She is a 36 y.o. female, who is referred here by [name], PA for the evaluation of allergies.  Patient was recently diagnosed with [name] Syndrome and is concerned about mast cell activation syndrome.  As a younger child she had history of allergies and recently her symptoms have gotten worse.   She reports symptoms of nasal congestion, rhinorrhea, sneezing, watery eyes, sinus pressure. Symptoms have been going on for 30+ years and worse the past 1 year. The symptoms are present all year around with worsening in spring and fall. Anosmia: sometimes. Headache: yes. She has used loratadine, benadryl, zyrtec, allegra-D, Flonase with some improvement in symptoms. Flonase caused epistaxis in the past. Sinus infections: no. Previous work up includes: skin testing at age 40 and not sure of results. No prior AIT.  Previous ENT evaluation: yes - had tonsillectomy. Previous sinus imaging: no. History of nasal polyps: no. Last eye exam: 1.5 year ago History of reflux: yes and saw GI in the past and no longer taking omeprazole.   Breathing: Diagnosed with asthma as a child but no sure about what her treatment for this was.  She reports symptoms of shortness of breath, wheezing for many years. Current medications include: none. She reports not using aerochamber with inhalers. She tried the following inhalers: not sure. Main triggers are exercise. In the last month, frequency of symptoms: rarely. Frequency of nocturnal symptoms: 0x/month. Frequency of SABA  use: 0x/week. Interference with physical activity: yes. In the last 12 months, emergency room visits/urgent care visits/doctor office visits or hospitalizations due to respiratory issues: 0. In the last 12 months, oral steroids courses: 0. Lifetime history of hospitalization for respiratory issues: no. Prior intubations: no. History of pneumonia: in 2013. She was evaluated by allergist in the past. Smoking exposure: quit in 2019. Up to date with flu vaccine: yes. Up to date with COVID-19 vaccine: yes. Prior Covid-19 infection: summer 2023.  Skin  Rash and itching started about many years ago.  Rash after showers occur mainly on the legs and sometimes gets itchy hands.  Rash resolves within a few hours.  No ecchymosis upon resolution. Associated symptoms include: none.  Frequency of episodes: at least every 1-2 weeks for rash. Itchy hands a few times per week. Suspected triggers are after showering. Denies any fevers, chills, changes in medications, foods, personal care products or recent infections. She has tried the following therapies: no medications.  Previous work up includes: none. Previous history of rash/hives: no. Patient is up to date with the following cancer screening tests: physical exam, pap smears.  08/18/2022 PCP visit: "Ehlers-Danlos Syndrome Patient is requesting a cardiologist referral as she feels as though it would be beneficial for her and does not know much about the body and does not know what she needs to do to well manage herself in regards to her POTS. She is also requesting a referral to an allergist. She does not know if she is allergic to anything, but states that she may develop a rash when she gets out of the shower sometimes. She also used to have allergies and sensitivities  to lots of foods when younger.   She is thinking about getting a referral to a neurologist because she states that on her bad days, her muscles would give out if she made a turn when walking or  if walking slow. She is also experiencing tremors. Patient reports that she occasionally kicks involuntarily on her right side when she is at rest. Her husband reports that she has increased sleep twitches."  Assessment and Plan: Jernie is a 36 y.o. female with: 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 Diagnosed with [name] and concerned about mast cell activation. Patient presents with a constellation of symptoms. No prior work up for this and no prior tryptase level. Discussed with patient that mast cell activation syndrome is a compilation of symptoms and there is no specific bloodwork for this.  Will get tryptase level to rule out mastocytosis.  Discussed referral to [facility] allergy - Dr. Charlann Noss I. Esperanza Sheets, MD, PhD who specializes in mast cell disorders.   Other allergic rhinitis Worsening rhino conjunctivitis symptoms. Took OTC antihistamines with some benefit. Flonase caused epistaxis. No prior AIT. Has heartburn at times. Today's skin testing showed: Positive to dust mites, perennial mold, mouse. Borderline to cat.  Start environmental control measures as below. Use over the counter antihistamines such as Zyrtec (cetirizine), Allegra (fexofenadine), or Xyzal (levocetirizine) daily as needed. May take twice a day during allergy flares. May switch antihistamines every few months.  Mild intermittent asthma without complication Diagnosed with asthma as a child. Not sure what type of inhalers she tried in the past. Covid-19 in 2023.  Today's skin testing showed: Positive to dust mites, perennial mold, mouse. Borderline to cat.  Negative to select foods.  Today's spirometry showed: normal pattern with 35% improvement in FEV1 post bronchodilator treatment. Clinically feeling slightly improved.  Daily controller medication(s): Start Flovent 172mg 2 puffs twice a day with spacer and rinse mouth afterwards. Patient will check pricing on other inhalers as Flovent was $300.  Spacer given  and demonstrated proper use with inhaler. Patient understood technique and all questions/concerned were addressed.  Prior to physical activity: May use albuterol rescue inhaler 2 puffs 5 to 15 minutes prior to strenuous physical activities. Rescue medications: May use albuterol rescue inhaler 2 puffs every 4 to 6 hours as needed for shortness of breath, chest tightness, coughing, and wheezing. Monitor frequency of use.  Get spirometry at next visit.  Rash and other nonspecific skin eruption Pruritus and rash started many years ago. No specific triggers noted. Worse after showering.  See below for proper skin care. Take a daily antihistamine.  Avoid the following potential triggers: alcohol, tight clothing, NSAIDs, hot showers and getting overheated. Get bloodwork to rule out other etiologies.  Other adverse food reactions, not elsewhere classified, subsequent encounter Throat closing after eating crab over 30 years go - unsure of specific details. Rash with watermelon. No recent work up. Today's skin testing showed: Negative to select foods.  Continue to avoid shellfish and watermelon. For mild symptoms you can take over the counter antihistamines such as Benadryl and monitor symptoms closely. If symptoms worsen or if you have severe symptoms including breathing issues, throat closure, significant swelling, whole body hives, severe diarrhea and vomiting, lightheadedness then seek immediate medical care. Get bloodwork.   Gastroesophageal reflux disease Used to take PPI but not anymore.  Monitor symptoms. Consider starting famotidine '20mg'$  twice a day.   Return in about 2 months (around 11/30/2022).  Meds ordered this encounter  Medications   famotidine (  PEPCID) 20 MG tablet    Sig: Take 1 tablet (20 mg total) by mouth 2 (two) times daily.    Dispense:  60 tablet    Refill:  3   albuterol (VENTOLIN HFA) 108 (90 Base) MCG/ACT inhaler    Sig: Inhale 2 puffs into the lungs every 4 (four)  hours as needed for wheezing or shortness of breath (coughing fits).    Dispense:  18 g    Refill:  1   fluticasone (FLOVENT HFA) 110 MCG/ACT inhaler    Sig: Inhale 2 puffs into the lungs in the morning and at bedtime. with spacer and rinse mouth afterwards.    Dispense:  1 each    Refill:  3   Lab Orders         ANA w/Reflex         Alpha-Gal Panel         CBC with Differential/Platelet         Chronic Urticaria         C3 and C4         Comprehensive metabolic panel         C-reactive protein         Sedimentation rate         Tryptase         Allergen Profile, Shellfish         Watermelon IgE      Other allergy screening: Food allergy: yes 30 years ago had throat closing and swollen mouth after eating crab.  She is not sure of specific details of the above event.  She has history of rash with watermelon and has not had watermelon for many years.  She does not have access to epinephrine autoinjector and not needed to use it.   Past work up includes: not recently. Dietary History: patient has been eating other foods including milk, eggs, peanut, treenuts, sesame, limited fish, soy, wheat, meats, fruits and vegetables.  She reports reading labels and avoiding shellfish, watermelon in diet completely.   Medication allergy: yes Hymenoptera allergy: no History of recurrent infections suggestive of immunodeficency: no  Diagnostics: Spirometry:  Tracings reviewed. Her effort: Good reproducible efforts. FVC: 3.53L FEV1: 2.43L, 70% predicted FEV1/FVC ratio: 69% Interpretation: Spirometry consistent with normal pattern with 35% improvement in FEV1 post bronchodilator treatment. Clinically feeling slightly improved.   Please see scanned spirometry results for details.  Skin Testing: Environmental allergy panel and select foods. Positive to dust mites, perennial mold, mouse. Borderline to cat.  Negative to select foods.  Results discussed with patient/family.  Airborne  Adult Perc - 09/29/22 0951     Time Antigen Placed 0946    Allergen Manufacturer Lavella Hammock    Location Back    Number of Test 58    1. Control-Buffer 50% Glycerol Negative    2. Control-Histamine 1 mg/ml 2+    3. Albumin saline Negative    4. [place] Negative    5. [place] Negative    6. Johnson Negative    7. [place] Blue Negative    8. [place] Omitted    9. Perennial Rye Negative    10. Sweet Vernal Negative    11. Timothy Negative    12. Cocklebur Negative    13. Burweed Marshelder Negative    14. Ragweed, short Negative    15. Ragweed, Giant Negative    16. Plantain,  English Negative    17. Lamb's Quarters Negative    18. Sheep Sorrell Negative  19. Rough Pigweed Negative    20. Marsh Elder, Rough Negative    21. Mugwort, Common Negative    22. Ash mix Negative    23. Birch mix Negative    24. Beech American Negative    25. Box, Elder Negative    26. Cedar, red Negative    27. Cottonwood, [place] Negative    28. Elm mix Negative    29. Hickory Negative    30. Maple mix Negative    31. Oak, [place] mix Negative    32. Pecan Pollen Negative    33. Pine mix Negative    34. Sycamore Eastern Negative    35. [place], Black Pollen Negative    36. Alternaria alternata Negative    37. Cladosporium Herbarum Negative    38. Aspergillus mix Negative    39. Penicillium mix Negative    40. Bipolaris sorokiniana (Helminthosporium) Negative    41. Drechslera spicifera (Curvularia) Negative    42. Mucor plumbeus Negative    43. Fusarium moniliforme Negative    44. Aureobasidium pullulans (pullulara) Negative    45. Rhizopus oryzae Negative    46. Botrytis cinera Negative    47. Epicoccum nigrum Negative    48. Phoma betae Negative    49. Candida Albicans Negative    50. Trichophyton mentagrophytes Negative    51. Mite, D Farinae  5,000 AU/ml Negative    52. Mite, D Pteronyssinus  5,000 AU/ml 3+    53. Cat Hair 10,000 BAU/ml Negative    54.  Dog Epithelia Negative     55. Mixed Feathers Negative    56. Horse Epithelia Negative    57. Cockroach, German Negative    58. Mouse 2+    59. Tobacco Leaf Negative             Intradermal - 09/29/22 1017     Time Antigen Placed 1022    Allergen Manufacturer Greer    Location Arm    Number of Test 14    Control Negative    Guatem[place] Negative    Johnson Negative    7 Grass Negative    Ragweed mix Negative    Weed mix Negative    Tree mix Negative    Mold 1 Negative    Mold 2 Negative    Mold 3 Negative    Mold 4 2+    Cat --   +/-   Dog Negative    Cockroach Negative             Food Adult Perc - 09/29/22 0900     Time Antigen Placed 5397    Allergen Manufacturer Lavella Hammock    Location Back    Number of allergen test 16    1. Peanut Negative    2. Soybean Negative    3. Wheat Negative    4. Sesame Negative    5. Milk, cow Negative    6. Egg White, Chicken Negative    7. Casein Negative    8. Shellfish Mix Negative    9. Fish Mix Negative    25. Shrimp Negative    26. Crab Negative    27. Lobster Negative    28. Oyster Negative    29. Scallops Negative    61. Cantaloupe Negative    62. Watermelon Negative             Past Medical History: Patient Active Problem List   Diagnosis Date Noted   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 09/29/2022  Other adverse food reactions, not elsewhere classified, subsequent encounter 09/29/2022   Other allergic rhinitis 09/29/2022   Rash and other nonspecific skin eruption 09/29/2022   Pruritus 09/29/2022   Mild intermittent asthma without complication 86/57/8469   Gastroesophageal reflux disease 09/29/2022   EDS (Ehlers-Danlos syndrome) 06/10/2022   Tear of right acetabular labrum 07/22/2021   Breast lump 02/12/2021   Bilateral hip pain 11/25/2020   Positive ANA (antinuclear antibody) 11/25/2020   Other fatigue 11/25/2020   Bladder retention 10/13/2020   HSV-1 (herpes simplex virus 1) infection 10/13/2020   History of anorexia nervosa  10/13/2020   GAD (generalized anxiety disorder) 10/13/2020   Family history of breast cancer    Family history of kidney cancer    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   Past Medical History:  Diagnosis Date   Abnormal Pap smear of cervix    Allergy    Anemia    suspected poor diet; vegetarian during teens   Anxiety    Asthma    childhood   Depression    Dysmenorrhea    Family history of breast cancer    Family history of kidney cancer    Granulosa cell tumor of ovary    History of iron deficiency anemia    age 51   Joint pain    Labral tear of right hip joint    Leukocytosis    Low vitamin D level    Migraine with aura    never on rx or saw neurology   Ovarian cancer ([place]) 2021   Ovarian cyst    Left   Pneumonia 01/2012    left lower lobe   Pre-diabetes 2015   had a "borderline" A1c   STD (sexually transmitted disease)    HSV1   Wears contact lenses    Wears glasses    Past Surgical History: Past Surgical History:  Procedure Laterality Date   ADENOIDECTOMY     BREAST BIOPSY Right 04/2020   benign   COLPOSCOPY     LAPAROSCOPIC OVARIAN CYSTECTOMY Left 11/13/2019   Procedure: LAPAROSCOPIC OVARIAN CYSTECTOMY/POSSIBLE LEFT OOPHORECTOMY/ COLLECTION OF PELVIC WASHINGS/ks;  Surgeon: Nunzio Cobbs, MD;  Location: [facility];  Service: Gynecology;  Laterality: Left;  Possible left oophorectomy Colection of pelvic washings To follow first case of the day   LEFT OOPHORECTOMY Left 11/2019   right hip arthroscopy  01/2022   for laberal tear repair   TONSILLECTOMY     UPPER GI ENDOSCOPY     Medication List:  Current Outpatient Medications  Medication Sig Dispense Refill   albuterol (VENTOLIN HFA) 108 (90 Base) MCG/ACT inhaler Inhale 2 puffs into the lungs every 4 (four) hours as needed for wheezing or shortness of breath (coughing fits). 18 g 1   busPIRone (BUSPAR) 5 MG tablet Take 1 tablet (5 mg total) by mouth 3 (three) times daily  as needed (anxiety). 90 tablet 1   cyanocobalamin 1000 MCG tablet Take 1,000 mcg by mouth daily.     escitalopram (LEXAPRO) 20 MG tablet Take 1/2 tablet ('10mg'$ ) tablet by mouth day. (Patient taking differently: Take 1/2 tablet ('10mg'$ ) tablet by mouth daily for 2 weeks out of cycle) 45 tablet 1   famotidine (PEPCID) 20 MG tablet Take 1 tablet (20 mg total) by mouth 2 (two) times daily. 60 tablet 3   fluticasone (FLOVENT HFA) 110 MCG/ACT inhaler Inhale 2 puffs into the lungs in the morning and at bedtime. with spacer and rinse mouth afterwards.  1 each 3   magnesium oxide (MAG-OX) 400 (240 Mg) MG tablet Take 400 mg by mouth daily.     Melatonin 10 MG TABS Take 1 tablet by mouth at bedtime.     Multiple Vitamins-Minerals (MULTIPLE VITAMINS/WOMENS PO) Take 1 tablet by mouth daily in the afternoon.     valACYclovir (VALTREX) 1000 MG tablet Take 2 tablets (2000 mg) by mouth twice a day for 24 hours as needed for cold sore. 30 tablet 1   No current facility-administered medications for this visit.   Allergies: Allergies  Allergen Reactions   Shellfish Allergy Anaphylaxis    Throat closes   Other Other (See Comments)    Watermelon, mold/mildew, pet dander, dust and dust mites  Swelling of eyes and congestion   Latex Rash   Sulfa Antibiotics Rash   Social History: Social History   Socioeconomic History   Marital status: Married    Spouse name: Not on file   Number of children: 0   Years of education: Not on file   Highest education level: Not on file  Occupational History    Comment: part time [employer]  Tobacco Use   Smoking status: Former    Packs/day: 1.00    Types: Cigarettes    Passive exposure: Past   Smokeless tobacco: Never  Vaping Use   Vaping Use: Never used  Substance and Sexual Activity   Alcohol use: Not Currently   Drug use: No   Sexual activity: Yes    Partners: Male    Birth control/protection: Other-see comments, Surgical    Comment: vasectomy  Other Topics  Concern   Not on file  Social History Narrative   Moved from [place]   Married   Working part-time in [employer], research (medical)   Social Determinants of Radio broadcast assistant Strain: Not on file  Food Insecurity: Not on file  Transportation Needs: Not on file  Physical Activity: Not on file  Stress: Not on file  Social Connections: Not on file   Lives in a house built in the [place]. Smoking: quit in 2019 Occupation: not employed  Environmental History: Water Damage/mildew in the house: yes Carpet in the family room: yes Carpet in the bedroom: yes Heating: gas Cooling: central Pet: yes 1 dog x 10 yrs, 1 cat x 12 yrs  Family History: Family History  Problem Relation Age of Onset   Asthma Mother    Allergic rhinitis Mother    Diabetes Mother    Hypertension Mother    Stroke Mother        TIA   Factor V Leiden deficiency Mother    Other Mother        pituitary tumor   Colon polyps Mother    Diverticulitis Mother    Allergic rhinitis Father    Eczema Father    Kidney cancer Father        cancerous tumor of kidney   Ulcerative colitis Father    Allergic rhinitis Brother    Eczema Brother    Other Brother        ear tumor, dx. age 25-6   Breast cancer Maternal Grandmother        dx. 80s   Stroke Maternal Grandfather    Rheum arthritis Maternal Great-grandmother    Cancer Other    Ovarian cancer Neg Hx    Endometrial cancer Neg Hx    Colon cancer Neg Hx    Review of Systems  Constitutional:  Negative for appetite change, chills, fever and unexpected weight change.  HENT:  Positive for congestion, rhinorrhea and sneezing.   Eyes:  Negative for itching.  Respiratory:  Positive for shortness of breath and wheezing. Negative for cough and chest tightness.   Cardiovascular:  Negative for chest pain.  Gastrointestinal:  Positive for nausea. Negative for abdominal pain and vomiting.  Skin:  Positive for rash.       pruritus   Allergic/Immunologic: Positive for environmental allergies.  Neurological:  Positive for headaches.    Objective: BP 98/64 (BP Location: Left Arm, Patient Position: Sitting, Cuff Size: Normal)   Pulse 70   Temp 98.2 F (36.8 C) (Temporal)   Resp 18   Ht 5' 7.5" (1.715 m)   Wt 134 lb (60.8 kg)   SpO2 97%   BMI 20.68 kg/m  Body mass index is 20.68 kg/m. Physical Exam Vitals and nursing note reviewed.  Constitutional:      Appearance: Normal appearance. She is well-developed.  HENT:     Head: Normocephalic and atraumatic.     Right Ear: Tympanic membrane and external ear normal.     Left Ear: Tympanic membrane and external ear normal.     Nose: Nose normal.     Mouth/Throat:     Mouth: Mucous membranes are moist.     Pharynx: Oropharynx is clear.  Eyes:     Conjunctiva/sclera: Conjunctivae normal.  Cardiovascular:     Rate and Rhythm: Normal rate and regular rhythm.     Heart sounds: Normal heart sounds. No murmur heard.    No friction rub. No gallop.  Pulmonary:     Effort: Pulmonary effort is normal.     Breath sounds: Normal breath sounds. No wheezing, rhonchi or rales.  Musculoskeletal:     Cervical back: Neck supple.  Skin:    General: Skin is warm.     Findings: No rash.  Neurological:     Mental Status: She is alert and oriented to person, place, and time.  Psychiatric:        Behavior: Behavior normal.   The plan was reviewed with the patient/family, and all questions/concerned were addressed.  It was my pleasure to see Kilani today and participate in her care. Please feel free to contact me with any questions or concerns.  Sincerely,  Rexene Alberts, DO Allergy & Immunology  [facility] office: [place] office: [phone]

## 2022-09-29 ENCOUNTER — Encounter: Payer: Self-pay | Admitting: Allergy

## 2022-09-29 ENCOUNTER — Ambulatory Visit: Payer: BC Managed Care – PPO | Admitting: Allergy

## 2022-09-29 ENCOUNTER — Other Ambulatory Visit: Payer: Self-pay

## 2022-09-29 VITALS — BP 98/64 | HR 70 | Temp 98.2°F | Resp 18 | Ht 67.5 in | Wt 134.0 lb

## 2022-09-29 DIAGNOSIS — R21 Rash and other nonspecific skin eruption: Secondary | ICD-10-CM | POA: Diagnosis not present

## 2022-09-29 DIAGNOSIS — K9049 Malabsorption due to intolerance, not elsewhere classified: Secondary | ICD-10-CM

## 2022-09-29 DIAGNOSIS — K219 Gastro-esophageal reflux disease without esophagitis: Secondary | ICD-10-CM

## 2022-09-29 DIAGNOSIS — T781XXD Other adverse food reactions, not elsewhere classified, subsequent encounter: Secondary | ICD-10-CM

## 2022-09-29 DIAGNOSIS — L299 Pruritus, unspecified: Secondary | ICD-10-CM

## 2022-09-29 DIAGNOSIS — J4520 Mild intermittent asthma, uncomplicated: Secondary | ICD-10-CM

## 2022-09-29 DIAGNOSIS — T7819XD Other adverse food reactions, not elsewhere classified, subsequent encounter: Secondary | ICD-10-CM

## 2022-09-29 DIAGNOSIS — D8940 Mast cell activation, unspecified: Secondary | ICD-10-CM

## 2022-09-29 DIAGNOSIS — Q796 Ehlers-Danlos syndrome, unspecified: Secondary | ICD-10-CM | POA: Diagnosis not present

## 2022-09-29 DIAGNOSIS — M6281 Muscle weakness (generalized): Secondary | ICD-10-CM | POA: Diagnosis not present

## 2022-09-29 DIAGNOSIS — Z7409 Other reduced mobility: Secondary | ICD-10-CM | POA: Diagnosis not present

## 2022-09-29 DIAGNOSIS — J3089 Other allergic rhinitis: Secondary | ICD-10-CM | POA: Diagnosis not present

## 2022-09-29 MED ORDER — ALBUTEROL SULFATE HFA 108 (90 BASE) MCG/ACT IN AERS: 2.0000 | INHALATION_SPRAY | RESPIRATORY_TRACT | 1 refills | Status: DC | PRN

## 2022-09-29 MED ORDER — FAMOTIDINE 20 MG PO TABS: 20.0000 mg | ORAL_TABLET | Freq: Two times a day (BID) | ORAL | 3 refills | Status: AC

## 2022-09-29 MED ORDER — FLUTICASONE PROPIONATE HFA 110 MCG/ACT IN AERO: 2.0000 | INHALATION_SPRAY | Freq: Two times a day (BID) | RESPIRATORY_TRACT | 3 refills | Status: DC

## 2022-09-29 NOTE — Assessment & Plan Note (Signed)
Diagnosed with asthma as a child. Not sure what type of inhalers she tried in the past. Covid-19 in 2023.  Today's skin testing showed: Positive to dust mites, perennial mold, mouse. Borderline to cat.  Negative to select foods.  Today's spirometry showed: normal pattern with 35% improvement in FEV1 post bronchodilator treatment. Clinically feeling slightly improved.  Daily controller medication(s): Start Flovent 134mg 2 puffs twice a day with spacer and rinse mouth afterwards. Patient will check pricing on other inhalers as Flovent was $300.  Spacer given and demonstrated proper use with inhaler. Patient understood technique and all questions/concerned were addressed.  Prior to physical activity: May use albuterol rescue inhaler 2 puffs 5 to 15 minutes prior to strenuous physical activities. Rescue medications: May use albuterol rescue inhaler 2 puffs every 4 to 6 hours as needed for shortness of breath, chest tightness, coughing, and wheezing. Monitor frequency of use.  Get spirometry at next visit.

## 2022-09-29 NOTE — Patient Instructions (Addendum)
Today's skin testing showed: Positive to dust mites, perennial mold, mouse. Borderline to cat.  Negative to select foods.   Results given.  Rule out mast cell issues Get bloodwork We are ordering labs, so please allow 1-2 weeks for the results to come back. With the newly implemented Cures Act, the labs might be visible to you at the same time that they become visible to me. However, I will not address the results until all of the results are back, so please be patient.  In the meantime, continue recommendations in your patient instructions, including avoidance measures (if applicable), until you hear from me. Consider referral to [facility] allergy - Dr. Charlann Noss I. Esperanza Sheets, MD, PhD.  Environmental allergies Start environmental control measures as below. Use over the counter antihistamines such as Zyrtec (cetirizine), Allegra (fexofenadine), or Xyzal (levocetirizine) daily as needed. May take twice a day during allergy flares. May switch antihistamines every few months.  Food Continue to avoid shellfish and watermelon. For mild symptoms you can take over the counter antihistamines such as Benadryl and monitor symptoms closely. If symptoms worsen or if you have severe symptoms including breathing issues, throat closure, significant swelling, whole body hives, severe diarrhea and vomiting, lightheadedness then seek immediate medical care. Get bloodwork.   Skin See below for proper skin care. Take a daily antihistamine.  Avoid the following potential triggers: alcohol, tight clothing, NSAIDs, hot showers and getting overheated. Get bloodwork.  GI symptoms Monitor symptoms. Consider starting famotidine '20mg'$  twice a day.   Breathing Daily controller medication(s): Start Flovent 125mg 2 puffs twice a day with spacer and rinse mouth afterwards. Spacer given and demonstrated proper use with inhaler. Patient understood technique and all questions/concerned were addressed.  Prior to  physical activity: May use albuterol rescue inhaler 2 puffs 5 to 15 minutes prior to strenuous physical activities. Rescue medications: May use albuterol rescue inhaler 2 puffs  every 4 to 6 hours as needed for shortness of breath, chest tightness, coughing, and wheezing. Monitor frequency of use.  Breathing control goals:  Full participation in all desired activities (may need albuterol before activity) Albuterol use two times or less a week on average (not counting use with activity) Cough interfering with sleep two times or less a month Oral steroids no more than once a year No hospitalizations   Follow up in 2 months or sooner if needed.    Skin care recommendations  Bath time: Always use lukewarm water. AVOID very hot or cold water. Keep bathing time to 5-10 minutes. Do NOT use bubble bath. Use a mild soap and use just enough to wash the dirty areas. Do NOT scrub skin vigorously.  After bathing, pat dry your skin with a towel. Do NOT rub or scrub the skin.  Moisturizers and prescriptions:  ALWAYS apply moisturizers immediately after bathing (within 3 minutes). This helps to lock-in moisture. Use the moisturizer several times a day over the whole body. Good summer moisturizers include: Aveeno, CeraVe, Cetaphil. Good winter moisturizers include: Aquaphor, Vaseline, Cerave, Cetaphil, Eucerin, Vanicream. When using moisturizers along with medications, the moisturizer should be applied about one hour after applying the medication to prevent diluting effect of the medication or moisturize around where you applied the medications. When not using medications, the moisturizer can be continued twice daily as maintenance.  Laundry and clothing: Avoid laundry products with added color or perfumes. Use unscented hypo-allergenic laundry products such as Tide free, Cheer free & gentle, and All free and clear.  If the skin  still seems dry or sensitive, you can try double-rinsing the  clothes. Avoid tight or scratchy clothing such as wool. Do not use fabric softeners or dyer sheets.   Control of House Dust Mite Allergen Dust mite allergens are a common trigger of allergy and asthma symptoms. While they can be found throughout the house, these microscopic creatures thrive in warm, humid environments such as bedding, upholstered furniture and carpeting. Because so much time is spent in the bedroom, it is essential to reduce mite levels there.  Encase pillows, mattresses, and box springs in special allergen-proof fabric covers or airtight, zippered plastic covers.  Bedding should be washed weekly in hot water (130 F) and dried in a hot dryer. Allergen-proof covers are available for comforters and pillows that can't be regularly washed.  Wash the allergy-proof covers every few months. Minimize clutter in the bedroom. Keep pets out of the bedroom.  Keep humidity less than 50% by using a dehumidifier or air conditioning. You can buy a humidity measuring device called a hygrometer to monitor this.  If possible, replace carpets with hardwood, linoleum, or washable area rugs. If that's not possible, vacuum frequently with a vacuum that has a HEPA filter. Remove all upholstered furniture and non-washable window drapes from the bedroom. Remove all non-washable stuffed toys from the bedroom.  Wash stuffed toys weekly. Pet Allergen Avoidance: Contrary to popular opinion, there are no "hypoallergenic" breeds of dogs or cats. That is because people are not allergic to an animal's hair, but to an allergen found in the animal's saliva, dander (dead skin flakes) or urine. Pet allergy symptoms typically occur within minutes. For some people, symptoms can build up and become most severe 8 to 12 hours after contact with the animal. People with severe allergies can experience reactions in public places if dander has been transported on the pet owners' clothing. Keeping an animal outdoors is only a  partial solution, since homes with pets in the yard still have higher concentrations of animal allergens. Before getting a pet, ask your allergist to determine if you are allergic to animals. If your pet is already considered part of your family, try to minimize contact and keep the pet out of the bedroom and other rooms where you spend a great deal of time. As with dust mites, vacuum carpets often or replace carpet with a hardwood floor, tile or linoleum. High-efficiency particulate air (HEPA) cleaners can reduce allergen levels over time. While dander and saliva are the source of cat and dog allergens, urine is the source of allergens from rabbits, hamsters, mice and [place] pigs; so ask a non-allergic family member to clean the animal's cage. If you have a pet allergy, talk to your allergist about the potential for allergy immunotherapy (allergy shots). This strategy can often provide long-term relief. Mold Control Mold and fungi can grow on a variety of surfaces provided certain temperature and moisture conditions exist.  Outdoor molds grow on plants, decaying vegetation and soil. The major outdoor mold, Alternaria and Cladosporium, are found in very high numbers during hot and dry conditions. Generally, a late summer - fall peak is seen for common outdoor fungal spores. Rain will temporarily lower outdoor mold spore count, but counts rise rapidly when the rainy period ends. The most important indoor molds are Aspergillus and Penicillium. Dark, humid and poorly ventilated basements are ideal sites for mold growth. The next most common sites of mold growth are the bathroom and the kitchen. Outdoor (Seasonal) Mold Control Use air conditioning  and keep windows closed. Avoid exposure to decaying vegetation. Avoid leaf raking. Avoid grain handling. Consider wearing a face mask if working in moldy areas.  Indoor (Perennial) Mold Control  Maintain humidity below 50%. Get rid of mold growth on hard  surfaces with water, detergent and, if necessary, 5% bleach (do not mix with other cleaners). Then dry the area completely. If mold covers an area more than 10 square feet, consider hiring an indoor environmental professional. For clothing, washing with soap and water is best. If moldy items cannot be cleaned and dried, throw them away. Remove sources e.g. contaminated carpets. Repair and seal leaking roofs or pipes. Using dehumidifiers in damp basements may be helpful, but empty the water and clean units regularly to prevent mildew from forming. All rooms, especially basements, bathrooms and kitchens, require ventilation and cleaning to deter mold and mildew growth. Avoid carpeting on concrete or damp floors, and storing items in damp areas.

## 2022-09-29 NOTE — Assessment & Plan Note (Signed)
Pruritus and rash started many years ago. No specific triggers noted. Worse after showering.  See below for proper skin care. Take a daily antihistamine.  Avoid the following potential triggers: alcohol, tight clothing, NSAIDs, hot showers and getting overheated. Get bloodwork to rule out other etiologies.

## 2022-09-29 NOTE — Assessment & Plan Note (Signed)
Used to take PPI but not anymore.  Monitor symptoms. Consider starting famotidine '20mg'$  twice a day.

## 2022-09-29 NOTE — Telephone Encounter (Signed)
Patient called back.   She is taking the Lexapro as prescribed daily x 2 weeks each month with cycle.  However, she is having a difficult time getting her available refill from the mail order pharmacy and would just like [place] to send it in to [facility] locally.   She is completely out of her medication.  OK?

## 2022-09-29 NOTE — Assessment & Plan Note (Signed)
Throat closing after eating crab over 30 years go - unsure of specific details. Rash with watermelon. No recent work up. Today's skin testing showed: Negative to select foods.  Continue to avoid shellfish and watermelon. For mild symptoms you can take over the counter antihistamines such as Benadryl and monitor symptoms closely. If symptoms worsen or if you have severe symptoms including breathing issues, throat closure, significant swelling, whole body hives, severe diarrhea and vomiting, lightheadedness then seek immediate medical care. Get bloodwork.

## 2022-09-29 NOTE — Assessment & Plan Note (Signed)
Diagnosed with [name] and concerned about mast cell activation. Patient presents with a constellation of symptoms. No prior work up for this and no prior tryptase level. Discussed with patient that mast cell activation syndrome is a compilation of symptoms and there is no specific bloodwork for this.  Will get tryptase level to rule out mastocytosis.  Discussed referral to [facility] allergy - Dr. Charlann Noss I. Esperanza Sheets, MD, PhD who specializes in mast cell disorders.

## 2022-09-29 NOTE — Telephone Encounter (Signed)
Patient called. Left message for her to call with how she is taking it.

## 2022-09-29 NOTE — Assessment & Plan Note (Signed)
Worsening rhino conjunctivitis symptoms. Took OTC antihistamines with some benefit. Flonase caused epistaxis. No prior AIT. Has heartburn at times. Today's skin testing showed: Positive to dust mites, perennial mold, mouse. Borderline to cat.  Start environmental control measures as below. Use over the counter antihistamines such as Zyrtec (cetirizine), Allegra (fexofenadine), or Xyzal (levocetirizine) daily as needed. May take twice a day during allergy flares. May switch antihistamines every few months.

## 2022-09-30 MED ORDER — ESCITALOPRAM OXALATE 20 MG PO TABS: ORAL_TABLET | ORAL | 0 refills | Status: DC

## 2022-10-06 LAB — CBC WITH DIFFERENTIAL/PLATELET
Basophils Absolute: 0.1 10*3/uL (ref 0.0–0.2)
Basos: 1 %
EOS (ABSOLUTE): 0.2 10*3/uL (ref 0.0–0.4)
Eos: 3 %
Hematocrit: 42.3 % (ref 34.0–46.6)
Hemoglobin: 13.9 g/dL (ref 11.1–15.9)
Immature Grans (Abs): 0 10*3/uL (ref 0.0–0.1)
Immature Granulocytes: 0 %
Lymphocytes Absolute: 3.5 10*3/uL — ABNORMAL HIGH (ref 0.7–3.1)
Lymphs: 42 %
MCH: 29 pg (ref 26.6–33.0)
MCHC: 32.9 g/dL (ref 31.5–35.7)
MCV: 88 fL (ref 79–97)
Monocytes Absolute: 0.6 10*3/uL (ref 0.1–0.9)
Monocytes: 7 %
Neutrophils Absolute: 4 10*3/uL (ref 1.4–7.0)
Neutrophils: 47 %
Platelets: 256 10*3/uL (ref 150–450)
RBC: 4.79 x10E6/uL (ref 3.77–5.28)
RDW: 12.2 % (ref 11.7–15.4)
WBC: 8.3 10*3/uL (ref 3.4–10.8)

## 2022-10-06 LAB — ALPHA-GAL PANEL
Allergen Lamb IgE: <0.1 kU/L
Beef IgE: <0.1 kU/L
IgE (Immunoglobulin E), Serum: 117 IU/mL (ref 6–495)
O215-IgE Alpha-Gal: <0.1 kU/L
Pork IgE: <0.1 kU/L

## 2022-10-06 LAB — COMPREHENSIVE METABOLIC PANEL
ALT: 13 IU/L (ref 0–32)
AST: 21 IU/L (ref 0–40)
Albumin/Globulin Ratio: 1.9 (ref 1.2–2.2)
Albumin: 4.8 g/dL (ref 3.9–4.9)
Alkaline Phosphatase: 60 IU/L (ref 44–121)
BUN/Creatinine Ratio: 20 (ref 9–23)
BUN: 13 mg/dL (ref 6–20)
Bilirubin Total: 0.2 mg/dL (ref 0.0–1.2)
CO2: 24 mmol/L (ref 20–29)
Calcium: 9.8 mg/dL (ref 8.7–10.2)
Chloride: 101 mmol/L (ref 96–106)
Creatinine, Ser: 0.66 mg/dL (ref 0.57–1.00)
Globulin, Total: 2.5 g/dL (ref 1.5–4.5)
Glucose: 90 mg/dL (ref 70–99)
Potassium: 4.2 mmol/L (ref 3.5–5.2)
Sodium: 139 mmol/L (ref 134–144)
Total Protein: 7.3 g/dL (ref 6.0–8.5)
eGFR: 116 mL/min/{1.73_m2} (ref >59)

## 2022-10-06 LAB — ANA W/REFLEX: Anti Nuclear Antibody (ANA): NEGATIVE

## 2022-10-06 LAB — SEDIMENTATION RATE: Sed Rate: 4 mm/hr (ref 0–32)

## 2022-10-06 LAB — ALLERGEN PROFILE, SHELLFISH
Clam IgE: <0.1 kU/L
F023-IgE Crab: <0.1 kU/L
F080-IgE Lobster: <0.1 kU/L
F290-IgE Oyster: <0.1 kU/L
Scallop IgE: <0.1 kU/L
Shrimp IgE: <0.1 kU/L

## 2022-10-06 LAB — C3 AND C4
Complement C3, Serum: 98 mg/dL (ref 82–167)
Complement C4, Serum: 25 mg/dL (ref 12–38)

## 2022-10-06 LAB — TRYPTASE: Tryptase: 10.9 ug/L (ref 2.2–13.2)

## 2022-10-06 LAB — ALLERGEN WATERMELON: Allergen Watermelon IgE: <0.1 kU/L

## 2022-10-06 LAB — CHRONIC URTICARIA: cu index: 3.7 (ref <10)

## 2022-10-06 LAB — C-REACTIVE PROTEIN: CRP: <1 mg/L (ref 0–10)

## 2022-10-07 ENCOUNTER — Telehealth: Payer: Self-pay | Admitting: Diagnostic Neuroimaging

## 2022-10-07 NOTE — Telephone Encounter (Signed)
Pt scheduled for 90 mins MR brain w/wo contrast and MR cervical spine w/wo contrast at [place] for 10/12/22 at 9:30am  [employer] XTAV#[id number] (exp.10/07/22-11/05/22)

## 2022-10-08 DIAGNOSIS — F321 Major depressive disorder, single episode, moderate: Secondary | ICD-10-CM | POA: Diagnosis not present

## 2022-10-08 DIAGNOSIS — F411 Generalized anxiety disorder: Secondary | ICD-10-CM | POA: Diagnosis not present

## 2022-10-12 ENCOUNTER — Ambulatory Visit: Payer: BC Managed Care – PPO

## 2022-10-12 DIAGNOSIS — R4189 Other symptoms and signs involving cognitive functions and awareness: Secondary | ICD-10-CM

## 2022-10-12 DIAGNOSIS — M62838 Other muscle spasm: Secondary | ICD-10-CM | POA: Diagnosis not present

## 2022-10-12 MED ORDER — GADOBENATE DIMEGLUMINE 529 MG/ML IV SOLN
12.0000 mL | Freq: Once | INTRAVENOUS | Status: AC | PRN
  Administered 2022-10-12: 12 mL via INTRAVENOUS

## 2022-10-15 ENCOUNTER — Ambulatory Visit: Payer: BC Managed Care – PPO | Admitting: Hematology and Oncology

## 2022-10-15 DIAGNOSIS — F411 Generalized anxiety disorder: Secondary | ICD-10-CM | POA: Diagnosis not present

## 2022-10-15 DIAGNOSIS — F321 Major depressive disorder, single episode, moderate: Secondary | ICD-10-CM | POA: Diagnosis not present

## 2022-10-18 ENCOUNTER — Telehealth: Payer: Self-pay

## 2022-10-18 NOTE — Telephone Encounter (Signed)
-----  Message from Penni Bombard, MD sent at 10/14/2022  7:10 PM EST ----- Unremarkable imaging results. Please call patient. Continue current plan. -VRP

## 2022-10-22 DIAGNOSIS — F321 Major depressive disorder, single episode, moderate: Secondary | ICD-10-CM | POA: Diagnosis not present

## 2022-10-22 DIAGNOSIS — F411 Generalized anxiety disorder: Secondary | ICD-10-CM | POA: Diagnosis not present

## 2022-10-29 DIAGNOSIS — F411 Generalized anxiety disorder: Secondary | ICD-10-CM | POA: Diagnosis not present

## 2022-10-29 DIAGNOSIS — F321 Major depressive disorder, single episode, moderate: Secondary | ICD-10-CM | POA: Diagnosis not present

## 2022-11-05 DIAGNOSIS — F411 Generalized anxiety disorder: Secondary | ICD-10-CM | POA: Diagnosis not present

## 2022-11-05 DIAGNOSIS — F321 Major depressive disorder, single episode, moderate: Secondary | ICD-10-CM | POA: Diagnosis not present

## 2022-11-08 ENCOUNTER — Encounter: Payer: Self-pay | Admitting: Allergy

## 2022-11-09 ENCOUNTER — Telehealth: Payer: Self-pay

## 2022-11-09 ENCOUNTER — Other Ambulatory Visit (HOSPITAL_COMMUNITY): Payer: Self-pay

## 2022-11-09 MED ORDER — CICLESONIDE 80 MCG/ACT IN AERS: 2.0000 | INHALATION_SPRAY | Freq: Two times a day (BID) | RESPIRATORY_TRACT | 5 refills | Status: DC

## 2022-11-09 NOTE — Telephone Encounter (Signed)
Patient Advocate Encounter   Received notification from [place] that prior authorization is required for Alvesco 80MCG/ACT aerosol  Submitted: 11-09-2022 [place]  Status is pending

## 2022-11-11 NOTE — Telephone Encounter (Signed)
[place]/[employer] called in - DOB verified - stated Alvesco PA has been DENIED due to the following:  Medication is not on the formulary. Patient must try/fail, or be unable to take ALL formulary alternatives( due to interactions, side effects, etc) - Asmanex, Qvar  Denial fax received, labeled, sent to [employer].  Forwarding message to provider for next step.

## 2022-11-12 DIAGNOSIS — F411 Generalized anxiety disorder: Secondary | ICD-10-CM | POA: Diagnosis not present

## 2022-11-12 DIAGNOSIS — F321 Major depressive disorder, single episode, moderate: Secondary | ICD-10-CM | POA: Diagnosis not present

## 2022-11-12 NOTE — Telephone Encounter (Signed)
Called and left a voicemail asking for patient to return call to discuss.  °

## 2022-11-12 NOTE — Telephone Encounter (Signed)
Please call patient.  I sent in Kathryn Park as patient requested it in her prior mychart message saying it was the cheapest option for her.  Let her know that insurance denied it and I can either send in Asmanex or Qvar.

## 2022-11-15 NOTE — Telephone Encounter (Signed)
Called and left a voicemail asking for patient to return call to discuss.  °

## 2022-11-16 NOTE — Telephone Encounter (Signed)
Noted  

## 2022-11-16 NOTE — Telephone Encounter (Signed)
Patient called back - advised patient of Dr. Julianne Rice message. Patient states she had found a coupon for the alvesco - that would make it a lot cheaper for her than the other alternatives - & that is why she requested alvesco. Patient states she has not met her deductible yet which is why the cost of the inhalers are so high. Patient requested for Dr. Maudie Mercury to hold off on sending anything else as of right now.

## 2022-11-19 DIAGNOSIS — F321 Major depressive disorder, single episode, moderate: Secondary | ICD-10-CM | POA: Diagnosis not present

## 2022-11-19 DIAGNOSIS — F411 Generalized anxiety disorder: Secondary | ICD-10-CM | POA: Diagnosis not present

## 2022-11-26 DIAGNOSIS — F411 Generalized anxiety disorder: Secondary | ICD-10-CM | POA: Diagnosis not present

## 2022-11-26 DIAGNOSIS — F321 Major depressive disorder, single episode, moderate: Secondary | ICD-10-CM | POA: Diagnosis not present

## 2022-12-01 ENCOUNTER — Other Ambulatory Visit: Payer: Self-pay

## 2022-12-01 ENCOUNTER — Ambulatory Visit: Payer: BC Managed Care – PPO | Admitting: Allergy

## 2022-12-01 ENCOUNTER — Encounter: Payer: Self-pay | Admitting: Allergy

## 2022-12-01 VITALS — BP 116/84 | HR 74 | Temp 98.3°F | Resp 20 | Ht 67.5 in | Wt 138.2 lb

## 2022-12-01 DIAGNOSIS — R21 Rash and other nonspecific skin eruption: Secondary | ICD-10-CM

## 2022-12-01 DIAGNOSIS — D8940 Mast cell activation, unspecified: Secondary | ICD-10-CM | POA: Diagnosis not present

## 2022-12-01 DIAGNOSIS — T781XXD Other adverse food reactions, not elsewhere classified, subsequent encounter: Secondary | ICD-10-CM

## 2022-12-01 DIAGNOSIS — J3089 Other allergic rhinitis: Secondary | ICD-10-CM | POA: Diagnosis not present

## 2022-12-01 DIAGNOSIS — J4520 Mild intermittent asthma, uncomplicated: Secondary | ICD-10-CM | POA: Diagnosis not present

## 2022-12-01 DIAGNOSIS — K219 Gastro-esophageal reflux disease without esophagitis: Secondary | ICD-10-CM

## 2022-12-01 MED ORDER — EPINEPHRINE 0.3 MG/0.3ML IJ SOAJ: 0.3000 mg | INTRAMUSCULAR | 1 refills | Status: AC | PRN

## 2022-12-01 NOTE — Assessment & Plan Note (Signed)
Past history - Pruritus and rash started many years ago. No specific triggers noted. Worse after showering.  Interim history - only has occasional episodes.  Continue proper skin care. Take a daily antihistamine.  Avoid the following potential triggers: alcohol, tight clothing, NSAIDs, hot showers and getting overheated.

## 2022-12-01 NOTE — Patient Instructions (Addendum)
Rule out mast cell issues Consider referral to [facility] allergy - Dr. Charlann Noss I. Esperanza Sheets, MD, PhD. Consider getting HAT test - it is $169. Swab collected.  This will take about 6-8 weeks for results to get back.  [email or web address]  Environmental allergies 2023 skin testing showed: Positive to dust mites, perennial mold, mouse. Borderline to cat.  Continue environmental control measures as below. Use over the counter antihistamines such as Zyrtec (cetirizine), Allegra (fexofenadine), or Xyzal (levocetirizine) daily as needed. May take twice a day during allergy flares. May switch antihistamines every few months. Use saline nasal spray.   Food Continue to avoid shellfish and watermelon. If interested we can schedule food challenge to watermelon. You must be off antihistamines for 3-5 days before. Must be in good health and not ill. No vaccines/injections/antibiotics within the past 7 days. Plan on being in the office for 2-3 hours and must bring in the food you want to do the oral challenge for. You must call to schedule an appointment and specify it's for a food challenge.  For mild symptoms you can take over the counter antihistamines such as Benadryl and monitor symptoms closely. If symptoms worsen or if you have severe symptoms including breathing issues, throat closure, significant swelling, whole body hives, severe diarrhea and vomiting, lightheadedness then inject epinephrine and seek immediate medical care afterwards.  Skin Continue proper skin care. Take a daily antihistamine.  Avoid the following potential triggers: alcohol, tight clothing, NSAIDs, hot showers and getting overheated.  GI symptoms Monitor symptoms. Restart famotidine '20mg'$  twice a day.  If still having issues we can try cromolyn next.   Breathing Normal breathing test today.  Daily controller medication(s): continue Flovent 172mg 2 puffs twice a day with spacer and  rinse mouth afterwards. Prior to physical activity: May use albuterol rescue inhaler 2 puffs 5 to 15 minutes prior to strenuous physical activities. Rescue medications: May use albuterol rescue inhaler 2 puffs  every 4 to 6 hours as needed for shortness of breath, chest tightness, coughing, and wheezing. Monitor frequency of use.  Breathing control goals:  Full participation in all desired activities (may need albuterol before activity) Albuterol use two times or less a week on average (not counting use with activity) Cough interfering with sleep two times or less a month Oral steroids no more than once a year No hospitalizations   Follow up in 4 months or sooner if needed.    Skin care recommendations  Bath time: Always use lukewarm water. AVOID very hot or cold water. Keep bathing time to 5-10 minutes. Do NOT use bubble bath. Use a mild soap and use just enough to wash the dirty areas. Do NOT scrub skin vigorously.  After bathing, pat dry your skin with a towel. Do NOT rub or scrub the skin.  Moisturizers and prescriptions:  ALWAYS apply moisturizers immediately after bathing (within 3 minutes). This helps to lock-in moisture. Use the moisturizer several times a day over the whole body. Good summer moisturizers include: Aveeno, CeraVe, Cetaphil. Good winter moisturizers include: Aquaphor, Vaseline, Cerave, Cetaphil, Eucerin, Vanicream. When using moisturizers along with medications, the moisturizer should be applied about one hour after applying the medication to prevent diluting effect of the medication or moisturize around where you applied the medications. When not using medications, the moisturizer can be continued twice daily as maintenance.  Laundry and clothing: Avoid laundry products with added color or perfumes. Use unscented hypo-allergenic laundry products such as Tide free, Cheer free & gentle, and All  free and clear.  If the skin still seems dry or sensitive, you can  try double-rinsing the clothes. Avoid tight or scratchy clothing such as wool. Do not use fabric softeners or dyer sheets.   Control of House Dust Mite Allergen Dust mite allergens are a common trigger of allergy and asthma symptoms. While they can be found throughout the house, these microscopic creatures thrive in warm, humid environments such as bedding, upholstered furniture and carpeting. Because so much time is spent in the bedroom, it is essential to reduce mite levels there.  Encase pillows, mattresses, and box springs in special allergen-proof fabric covers or airtight, zippered plastic covers.  Bedding should be washed weekly in hot water (130 F) and dried in a hot dryer. Allergen-proof covers are available for comforters and pillows that can't be regularly washed.  Wash the allergy-proof covers every few months. Minimize clutter in the bedroom. Keep pets out of the bedroom.  Keep humidity less than 50% by using a dehumidifier or air conditioning. You can buy a humidity measuring device called a hygrometer to monitor this.  If possible, replace carpets with hardwood, linoleum, or washable area rugs. If that's not possible, vacuum frequently with a vacuum that has a HEPA filter. Remove all upholstered furniture and non-washable window drapes from the bedroom. Remove all non-washable stuffed toys from the bedroom.  Wash stuffed toys weekly. Pet Allergen Avoidance: Contrary to popular opinion, there are no "hypoallergenic" breeds of dogs or cats. That is because people are not allergic to an animal's hair, but to an allergen found in the animal's saliva, dander (dead skin flakes) or urine. Pet allergy symptoms typically occur within minutes. For some people, symptoms can build up and become most severe 8 to 12 hours after contact with the animal. People with severe allergies can experience reactions in public places if dander has been transported on the pet owners' clothing. Keeping an animal  outdoors is only a partial solution, since homes with pets in the yard still have higher concentrations of animal allergens. Before getting a pet, ask your allergist to determine if you are allergic to animals. If your pet is already considered part of your family, try to minimize contact and keep the pet out of the bedroom and other rooms where you spend a great deal of time. As with dust mites, vacuum carpets often or replace carpet with a hardwood floor, tile or linoleum. High-efficiency particulate air (HEPA) cleaners can reduce allergen levels over time. While dander and saliva are the source of cat and dog allergens, urine is the source of allergens from rabbits, hamsters, mice and [place] pigs; so ask a non-allergic family member to clean the animal's cage. If you have a pet allergy, talk to your allergist about the potential for allergy immunotherapy (allergy shots). This strategy can often provide long-term relief. Mold Control Mold and fungi can grow on a variety of surfaces provided certain temperature and moisture conditions exist.  Outdoor molds grow on plants, decaying vegetation and soil. The major outdoor mold, Alternaria and Cladosporium, are found in very high numbers during hot and dry conditions. Generally, a late summer - fall peak is seen for common outdoor fungal spores. Rain will temporarily lower outdoor mold spore count, but counts rise rapidly when the rainy period ends. The most important indoor molds are Aspergillus and Penicillium. Dark, humid and poorly ventilated basements are ideal sites for mold growth. The next most common sites of mold growth are the bathroom and the kitchen.  Outdoor (Seasonal) Mold Control Use air conditioning and keep windows closed. Avoid exposure to decaying vegetation. Avoid leaf raking. Avoid grain handling. Consider wearing a face mask if working in moldy areas.  Indoor (Perennial) Mold Control  Maintain humidity below 50%. Get rid of mold  growth on hard surfaces with water, detergent and, if necessary, 5% bleach (do not mix with other cleaners). Then dry the area completely. If mold covers an area more than 10 square feet, consider hiring an indoor environmental professional. For clothing, washing with soap and water is best. If moldy items cannot be cleaned and dried, throw them away. Remove sources e.g. contaminated carpets. Repair and seal leaking roofs or pipes. Using dehumidifiers in damp basements may be helpful, but empty the water and clean units regularly to prevent mildew from forming. All rooms, especially basements, bathrooms and kitchens, require ventilation and cleaning to deter mold and mildew growth. Avoid carpeting on concrete or damp floors, and storing items in damp areas.

## 2022-12-01 NOTE — Assessment & Plan Note (Signed)
Past history - Diagnosed with asthma as a child. Not sure what type of inhalers she tried in the past. Covid-19 in 2023. 2023 spirometry showed: normal pattern with 35% improvement in FEV1 post bronchodilator treatment. Clinically feeling slightly improved.  Interim history - doing better with Flovent. Some issues with cost of ICS inhalers as she hasn't met deductible yet. Normal spirometry today - better than prior one.  Daily controller medication(s): continue Flovent 174mg 2 puffs twice a day with spacer and rinse mouth afterwards. Prior to physical activity: May use albuterol rescue inhaler 2 puffs 5 to 15 minutes prior to strenuous physical activities. Rescue medications: May use albuterol rescue inhaler 2 puffs  every 4 to 6 hours as needed for shortness of breath, chest tightness, coughing, and wheezing. Monitor frequency of use.

## 2022-12-01 NOTE — Progress Notes (Signed)
Follow Up Note  RE: Kathryn Park MRN: [id number] DOB: 31-Jul-1986 Date of Office Visit: 12/01/2022  Referring provider: [name], PA Primary care provider: [name], [place]  Chief Complaint: Follow-up (2 month follow up no concern)  History of Present Illness: I had the pleasure of seeing Kathryn Park for a follow up visit at the [facility] on 12/01/2022. She is a 37 y.o. Park, who is being followed for allergic rhinitis, asthma, rash, adverse food reaction, GERD and concerns about mast cell. Her previous allergy office visit was on 09/29/2022 with Dr. Maudie Mercury. Today is a regular follow up visit.  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 Patient going to see cardiologist for possible POTS.  Taking zyrtec '10mg'$  daily with some benefit.  Did not make appointment at [facility] yet. Interested in getting swab done for HAT.   Other allergic rhinitis Some dry nares.   Mild intermittent asthma Patient has been taking Flovent 180mg 2 puffs 1-2 times per day with some benefit. Having issues with insurance.  Using albuterol prior to exertion with good benefit.   Rash Occasional rashes.   Food  Maybe interested in eating watermelon.  No reactions.    Gastroesophageal reflux disease Took famotidine with good benefit. Patient forgot to take it and noticed some symptoms.  Assessment and Plan: Kathryn a 37y.o. Park with: 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 Past history - Diagnosed with EDrue Dunand concerned about mast cell activation. Patient presents with a constellation of symptoms. No prior work up for this. Interim history - 2023 bloodwork (ANA, alpha gal, CBC diff, CU, C3, C4, CMP, crp, ESR) all unremarkable. Tryptase level 10.9. [facility] OCharlann NossI. IEsperanza Sheets MD, PhD. Consider getting HAT test - it is $169. Patient interested in getting this done today.  Swab sample collected in the office  and mailed out.  This will take about 6-8 weeks for results to get back.   Other allergic rhinitis Past history - Worsening rhino conjunctivitis symptoms. Took OTC antihistamines with some benefit. Flonase caused epistaxis. No prior AIT. 2023 skin testing showed: Positive to dust mites, perennial mold, mouse. Borderline to cat.  Interim history - dry nares. Continue environmental control measures as below. Use over the counter antihistamines such as Zyrtec (cetirizine), Allegra (fexofenadine), or Xyzal (levocetirizine) daily as needed. May take twice a day during allergy flares. May switch antihistamines every few months. Use saline nasal spray.   Mild intermittent asthma without complication Past history - Diagnosed with asthma as a child. Not sure what type of inhalers she tried in the past. Covid-19 in 2023. 2023 spirometry showed: normal pattern with 35% improvement in FEV1 post bronchodilator treatment. Clinically feeling slightly improved.  Interim history - doing better with Flovent. Some issues with cost of ICS inhalers as she hasn't met deductible yet. Normal spirometry today - better than prior one.  Daily controller medication(s): continue Flovent 1189m 2 puffs twice a day with spacer and rinse mouth afterwards. Prior to physical activity: May use albuterol rescue inhaler 2 puffs 5 to 15 minutes prior to strenuous physical activities. Rescue medications: May use albuterol rescue inhaler 2 puffs  every 4 to 6 hours as needed for shortness of breath, chest tightness, coughing, and wheezing. Monitor frequency of use.   Gastroesophageal reflux disease Improved with famotidine.  Monitor symptoms. Restart famotidine '20mg'$  twice a day.   Other adverse food reactions, not elsewhere classified, subsequent encounter Past history -  Throat closing after eating crab over 30 years go - unsure of specific details. Rash with watermelon. No recent work up. 2023 skin testing showed: Negative to  select foods.  Interim history - 2023 bloodwork negative to shellfish and watermelon.  Continue to avoid shellfish and watermelon. If interested we can schedule food challenge to watermelon.  For mild symptoms you can take over the counter antihistamines such as Benadryl and monitor symptoms closely. If symptoms worsen or if you have severe symptoms including breathing issues, throat closure, significant swelling, whole body hives, severe diarrhea and vomiting, lightheadedness then inject epinephrine and seek immediate medical care afterwards.  Rash and other nonspecific skin eruption Past history - Pruritus and rash started many years ago. No specific triggers noted. Worse after showering.  Interim history - only has occasional episodes.  Continue proper skin care. Take a daily antihistamine.  Avoid the following potential triggers: alcohol, tight clothing, NSAIDs, hot showers and getting overheated.  Return in about 4 months (around 04/01/2023).  Meds ordered this encounter  Medications   EPINEPHrine 0.3 mg/0.3 mL IJ SOAJ injection    Sig: Inject 0.3 mg into the muscle as needed for anaphylaxis.    Dispense:  2 each    Refill:  1    May dispense generic/Mylan/Teva brand.   Lab Orders  No laboratory test(s) ordered today    Diagnostics: Spirometry:  Tracings reviewed. Her effort: Good reproducible efforts. FVC: 4.01L FEV1: 3.30L, 97% predicted FEV1/FVC ratio: 82% Interpretation: Spirometry consistent with normal pattern.  Please see scanned spirometry results for details.  Medication List:  Current Outpatient Medications  Medication Sig Dispense Refill   albuterol (VENTOLIN HFA) 108 (90 Base) MCG/ACT inhaler Inhale 2 puffs into the lungs every 4 (four) hours as needed for wheezing or shortness of breath (coughing fits). 18 g 1   busPIRone (BUSPAR) 5 MG tablet Take 1 tablet (5 mg total) by mouth 3 (three) times daily as needed (anxiety). 90 tablet 1   ciclesonide (ALVESCO) 80  MCG/ACT inhaler Inhale 2 puffs into the lungs 2 (two) times daily. with spacer and rinse mouth afterwards. 1 each 5   cyanocobalamin 1000 MCG tablet Take 1,000 mcg by mouth daily.     EPINEPHrine 0.3 mg/0.3 mL IJ SOAJ injection Inject 0.3 mg into the muscle as needed for anaphylaxis. 2 each 1   escitalopram (LEXAPRO) 20 MG tablet Take 1/2 tablet ('10mg'$ ) tablet by mouth daily for 2 weeks out of cycle 45 tablet 0   famotidine (PEPCID) 20 MG tablet Take 1 tablet (20 mg total) by mouth 2 (two) times daily. 60 tablet 3   magnesium oxide (MAG-OX) 400 (240 Mg) MG tablet Take 400 mg by mouth daily.     Melatonin 10 MG TABS Take 1 tablet by mouth at bedtime.     Multiple Vitamins-Minerals (MULTIPLE VITAMINS/WOMENS PO) Take 1 tablet by mouth daily in the afternoon.     valACYclovir (VALTREX) 1000 MG tablet Take 2 tablets (2000 mg) by mouth twice a day for 24 hours as needed for cold sore. 30 tablet 1   No current facility-administered medications for this visit.   Allergies: Allergies  Allergen Reactions   Shellfish Allergy Anaphylaxis    Throat closes   Other Other (See Comments)    Watermelon, mold/mildew, pet dander, dust and dust mites  Swelling of eyes and congestion   Latex Rash   Sulfa Antibiotics Rash   I reviewed her past medical history, social history, family history, and environmental history and  no significant changes have been reported from her previous visit.  Review of Systems  Constitutional:  Negative for appetite change, chills, fever and unexpected weight change.  HENT:  Negative for congestion and rhinorrhea.   Eyes:  Negative for itching.  Respiratory:  Negative for cough, chest tightness, shortness of breath and wheezing.   Cardiovascular:  Negative for chest pain.  Gastrointestinal:  Negative for abdominal pain and vomiting.  Skin:  Positive for rash.       pruritus  Allergic/Immunologic: Positive for environmental allergies.    Objective: BP 116/84   Pulse 74    Temp 98.3 F (36.8 C)   Resp 20   Ht 5' 7.5" (1.715 m)   Wt 138 lb 3.2 oz (62.7 kg)   SpO2 98%   BMI 21.33 kg/m  Body mass index is 21.33 kg/m. Physical Exam Vitals and nursing note reviewed.  Constitutional:      Appearance: Normal appearance. She is well-developed.  HENT:     Head: Normocephalic and atraumatic.     Right Ear: Tympanic membrane and external ear normal.     Left Ear: Tympanic membrane and external ear normal.     Nose: Nose normal.     Mouth/Throat:     Mouth: Mucous membranes are moist.     Pharynx: Oropharynx is clear.  Eyes:     Conjunctiva/sclera: Conjunctivae normal.  Cardiovascular:     Rate and Rhythm: Normal rate and regular rhythm.     Heart sounds: Normal heart sounds. No murmur heard.    No friction rub. No gallop.  Pulmonary:     Effort: Pulmonary effort is normal.     Breath sounds: Normal breath sounds. No wheezing, rhonchi or rales.  Musculoskeletal:     Cervical back: Neck supple.  Skin:    General: Skin is warm.     Findings: No rash.  Neurological:     Mental Status: She is alert and oriented to person, place, and time.  Psychiatric:        Behavior: Behavior normal.    Previous notes and tests were reviewed. The plan was reviewed with the patient/family, and all questions/concerned were addressed.  It was my pleasure to see Kathryn Park today and participate in her care. Please feel free to contact me with any questions or concerns.  Sincerely,  Rexene Alberts, DO Allergy & Immunology  [facility] office: [place] office: [phone]

## 2022-12-01 NOTE — Assessment & Plan Note (Signed)
Past history - Worsening rhino conjunctivitis symptoms. Took OTC antihistamines with some benefit. Flonase caused epistaxis. No prior AIT. 2023 skin testing showed: Positive to dust mites, perennial mold, mouse. Borderline to cat.  Interim history - dry nares. Continue environmental control measures as below. Use over the counter antihistamines such as Zyrtec (cetirizine), Allegra (fexofenadine), or Xyzal (levocetirizine) daily as needed. May take twice a day during allergy flares. May switch antihistamines every few months. Use saline nasal spray.

## 2022-12-01 NOTE — Assessment & Plan Note (Signed)
Past history - Throat closing after eating crab over 30 years go - unsure of specific details. Rash with watermelon. No recent work up. 2023 skin testing showed: Negative to select foods.  Interim history - 2023 bloodwork negative to shellfish and watermelon.  Continue to avoid shellfish and watermelon. If interested we can schedule food challenge to watermelon.  For mild symptoms you can take over the counter antihistamines such as Benadryl and monitor symptoms closely. If symptoms worsen or if you have severe symptoms including breathing issues, throat closure, significant swelling, whole body hives, severe diarrhea and vomiting, lightheadedness then inject epinephrine and seek immediate medical care afterwards.

## 2022-12-01 NOTE — Assessment & Plan Note (Signed)
Improved with famotidine.  Monitor symptoms. Restart famotidine '20mg'$  twice a day.

## 2022-12-01 NOTE — Assessment & Plan Note (Signed)
Past history - Diagnosed with [name] and concerned about mast cell activation. Patient presents with a constellation of symptoms. No prior work up for this. Interim history - 2023 bloodwork (ANA, alpha gal, CBC diff, CU, C3, C4, CMP, crp, ESR) all unremarkable. Tryptase level 10.9. Consider referral to [facility] allergy - Dr. Charlann Noss I. Esperanza Sheets, MD, PhD. Consider getting HAT test - it is $169. Patient interested in getting this done today.  Swab sample collected in the office and mailed out.  This will take about 6-8 weeks for results to get back.

## 2022-12-10 DIAGNOSIS — F321 Major depressive disorder, single episode, moderate: Secondary | ICD-10-CM | POA: Diagnosis not present

## 2022-12-14 ENCOUNTER — Ambulatory Visit: Payer: BC Managed Care – PPO | Attending: Internal Medicine | Admitting: Internal Medicine

## 2022-12-14 ENCOUNTER — Encounter: Payer: Self-pay | Admitting: Internal Medicine

## 2022-12-14 VITALS — BP 112/86 | HR 101 | Ht 67.5 in | Wt 136.2 lb

## 2022-12-14 DIAGNOSIS — G901 Familial dysautonomia [Riley-Day]: Secondary | ICD-10-CM | POA: Diagnosis not present

## 2022-12-14 DIAGNOSIS — G90A Postural orthostatic tachycardia syndrome (POTS): Secondary | ICD-10-CM | POA: Diagnosis not present

## 2022-12-14 DIAGNOSIS — D8940 Mast cell activation, unspecified: Secondary | ICD-10-CM

## 2022-12-14 DIAGNOSIS — Q796 Ehlers-Danlos syndrome, unspecified: Secondary | ICD-10-CM

## 2022-12-14 DIAGNOSIS — Q8740 Marfan's syndrome, unspecified: Secondary | ICD-10-CM

## 2022-12-14 NOTE — Progress Notes (Signed)
ELECTROPHYSIOLOGY CONSULT NOTE  Patient ID: Kathryn Park, MRN: [id number], DOB/AGE: Mar 30, 1986 37 y.o. Admit date: (Not on file) Date of Consult: 12/14/2022  Primary Physician: [name], PA Primary Cardiologist:      Briant Cedar Kathryn Park is a 37 y.o. female who is being seen today for the evaluation of POTS at the request of S [place] .    HPI Kathryn Park is a 37 y.o. female with a longstanding history of orthostatic intolerance joint aches joint subluxations recently diagnosed with Ehlers-Danlos type III/ joint hypermobility syndrome  Over the last for 5 years or if there is a significant worsening in her functional status to the point that she is mostly now chair bound she is able to walk around with difficulty activities of daily living are exhausting.  History of syncope at least once in the shower and multiple episodes of presyncope aborted by sitting or lying down.  Shower intolerance heat intolerance and menses intolerance.  Cognitive issues described as brain fog, poor sleep, daytime somnolence and unrestful sleep. Multiple GI issues  With worsening gait issues, she has been seen by neurology.  MRI of neck and head were apparently normal.  Diet is modestly deplete of sodium as well as fluids (    Date Cr K Hgb 13.9  12/23 0.66 4.2 13.9              Past Medical History:  Diagnosis Date   Abnormal Pap smear of cervix    Allergy    Anemia    suspected poor diet; vegetarian during teens   Anxiety    Asthma    childhood   Depression    Dysmenorrhea    Family history of breast cancer    Family history of kidney cancer    Granulosa cell tumor of ovary    History of iron deficiency anemia    age 59   Joint pain    Labral tear of right hip joint    Leukocytosis    Low vitamin D level    Migraine with aura    never on rx or saw neurology   Ovarian cancer ([place]) 2021   Ovarian cyst    Left    Pneumonia 01/2012    left lower lobe   Pre-diabetes 2015   had a "borderline" A1c   STD (sexually transmitted disease)    HSV1   Wears contact lenses    Wears glasses       Surgical History:  Past Surgical History:  Procedure Laterality Date   ADENOIDECTOMY     BREAST BIOPSY Right 04/2020   benign   COLPOSCOPY     LAPAROSCOPIC OVARIAN CYSTECTOMY Left 11/13/2019   Procedure: LAPAROSCOPIC OVARIAN CYSTECTOMY/POSSIBLE LEFT OOPHORECTOMY/ COLLECTION OF PELVIC WASHINGS/ks;  Surgeon: Nunzio Cobbs, MD;  Location: [facility];  Service: Gynecology;  Laterality: Left;  Possible left oophorectomy Colection of pelvic washings To follow first case of the day   LEFT OOPHORECTOMY Left 11/2019   right hip arthroscopy  01/2022   for laberal tear repair   TONSILLECTOMY     UPPER GI ENDOSCOPY       Home Meds: Current Meds  Medication Sig   albuterol (VENTOLIN HFA) 108 (90 Base) MCG/ACT inhaler Inhale 2 puffs into the lungs every 4 (four) hours as needed for wheezing or shortness of breath (coughing fits).   busPIRone (BUSPAR) 5 MG tablet Take 1 tablet (5 mg  total) by mouth 3 (three) times daily as needed (anxiety).   ciclesonide (ALVESCO) 80 MCG/ACT inhaler Inhale 2 puffs into the lungs 2 (two) times daily. with spacer and rinse mouth afterwards.   cyanocobalamin 1000 MCG tablet Take 1,000 mcg by mouth daily.   EPINEPHrine 0.3 mg/0.3 mL IJ SOAJ injection Inject 0.3 mg into the muscle as needed for anaphylaxis.   escitalopram (LEXAPRO) 20 MG tablet Take 1/2 tablet ('10mg'$ ) tablet by mouth daily for 2 weeks out of cycle   famotidine (PEPCID) 20 MG tablet Take 1 tablet (20 mg total) by mouth 2 (two) times daily.   magnesium oxide (MAG-OX) 400 (240 Mg) MG tablet Take 400 mg by mouth daily.   Melatonin 10 MG TABS Take 1 tablet by mouth at bedtime.   Multiple Vitamins-Minerals (MULTIPLE VITAMINS/WOMENS PO) Take 1 tablet by mouth daily in the afternoon.   valACYclovir  (VALTREX) 1000 MG tablet Take 2 tablets (2000 mg) by mouth twice a day for 24 hours as needed for cold sore.    Allergies:  Allergies  Allergen Reactions   Shellfish Allergy Anaphylaxis    Throat closes   Other Other (See Comments)    Watermelon, mold/mildew, pet dander, dust and dust mites  Swelling of eyes and congestion   Latex Rash   Sulfa Antibiotics Rash    Social History   Socioeconomic History   Marital status: Married    Spouse name: Not on file   Number of children: 0   Years of education: Not on file   Highest education level: Not on file  Occupational History    Comment: part time [employer]  Tobacco Use   Smoking status: Former    Packs/day: 1.00    Types: Cigarettes    Passive exposure: Past   Smokeless tobacco: Never  Vaping Use   Vaping Use: Never used  Substance and Sexual Activity   Alcohol use: Not Currently   Drug use: No   Sexual activity: Yes    Partners: Male    Birth control/protection: Other-see comments, Surgical    Comment: vasectomy  Other Topics Concern   Not on file  Social History Narrative   Moved from [place] [facility]   Married   Working part-time in [employer] broadcast assistant Strain: Not on file  Food Insecurity: Not on file  Transportation Needs: Not on file  Physical Activity: Not on file  Stress: Not on file  Social Connections: Not on file  Intimate Partner Violence: Not on file     Family History  Problem Relation Age of Onset   Asthma Mother    Allergic rhinitis Mother    Diabetes Mother    Hypertension Mother    Stroke Mother        TIA   Factor V Leiden deficiency Mother    Other Mother        pituitary tumor   Colon polyps Mother    Diverticulitis Mother    Allergic rhinitis Father    Eczema Father    Kidney cancer Father        cancerous tumor of kidney   Ulcerative colitis Father    Allergic rhinitis Brother    Eczema Brother    Other  Brother        ear tumor, dx. age 20-6   Breast cancer Maternal Grandmother        dx. 27s   Stroke Maternal Grandfather  Rheum arthritis Maternal Great-grandmother    Cancer Other    Ovarian cancer Neg Hx    Endometrial cancer Neg Hx    Colon cancer Neg Hx      ROS:  Please see the history of present illness.     All other systems reviewed and negative.    Physical Exam: Blood pressure 112/86, pulse (!) 101, height 5' 7.5" (1.715 m), weight 136 lb 3.2 oz (61.8 kg), last menstrual period 11/22/2022, SpO2 99 %. General: Well developed, well nourished female in no acute distress. Head: Normocephalic, atraumatic, sclera non-icteric, no xanthomas, nares are without discharge. EENT: normal  Neck: . JVD not elevated. Back:without scoliosis kyphosis Lungs:Breathing is unlabored. Heart: RRR with S1 S2. No  murmur . No rubs, or gallops appreciated. Msk:  Strength and tone appear normal for age. Extremities: No clubbing or cyanosis. No  edema.  Distal pedal pulses are 2+ and equal bilaterally.  Arachnodactyly and joint hypermobility Skin: Warm and Dry Neuro: Alert and oriented X 3. CN III-XII intact Grossly normal sensory and motor function . Psych:  Responds to questions appropriately with a normal affect.        EKG: sinus @ 69 14/09/37   Assessment and Plan:  Dysautonomia  Gait disturbance disorder  Ovarian cancer history-granulosa cell tumor  Possible and mast cell activation syndrome  Ehlers-Danlos syndrome with history of multiple joint subluxation  Arachnodactyly   The patient has a longstanding history of joint issues lightheadedness syncope and presyncope more recently aggravated.  Symptoms include brain fog GI issues fatigue.  Objective findings today are suggestive of orthostatic intolerance  We discussed extensively the issues of dysautonomia, the physiology of orthstasis and positional stress.  We discussed the role of salt and water repletion, the importance  of exercise, often needing to be started in the recumbent position, and the awareness of triggers and the role of ambient heat and dehydration  Discussed salt substitutes with a  goal of 2-3 gms of Sodium or 5-7 gm of sodium Chloride daily.  Rehydration solutions include Liquid IV, NUUN,  Normralyte pedialyte advanced care and Banana Bags.  Salt tablets include plain salt tablets, SaltStick Vitassium, thermatabs amongst others.   The higher sodium concentration per serving on this list is normalyte about 800 mg of sodium per serving LMNT1000 mg TriOral,nd most recently t Within You hydration formula all at 1000 mg  Salt supplements are best used with adjunctive sugar  Also discussed the role of compression wear including thigh sleeves and abdominal binders.  Calf compression is not specifically recommended..  Her gait disturbance with striking which she is has is intermittent.  I have recommended that she consider seeing Dr. Carles Collet who specializes in movement disorders have also suggested that she consider referral to the [facility] in [place]  Discussed also importance of soul care today with her and her husband.  They state they are at a better place than they have been.  We will undertake an echocardiogram to look at the mitral valve structures as well as a CT scan of the aorta given findings suggested of Marfan  We wait the evaluation for mast cell activation syndrome  Telehealth follow-up in about 6 to 8 weeks  Virl Axe

## 2022-12-15 NOTE — Addendum Note (Signed)
Addended by: Thora Lance on: 12/15/2022 12:01 PM   Modules accepted: Orders

## 2022-12-15 NOTE — Addendum Note (Signed)
Addended by: Michelle Nasuti on: 12/15/2022 10:11 AM   Modules accepted: Orders

## 2022-12-15 NOTE — Patient Instructions (Signed)
Medication Instructions:  Your physician recommends that you continue on your current medications as directed. Please refer to the Current Medication list given to you today.  *If you need a refill on your cardiac medications before your next appointment, please call your pharmacy*   Lab Work: None ordered.  If you have labs (blood work) drawn today and your tests are completely normal, you will receive your results only by: [place] (if you have MyChart) OR A paper copy in the mail If you have any lab test that is abnormal or we need to change your treatment, we will call you to review the results.   Testing/Procedures:  You will be contacted to schedule these appointments.  Your physician has requested that you have an echocardiogram. Echocardiography is a painless test that uses sound waves to create images of your heart. It provides your doctor with information about the size and shape of your heart and how well your heart's chambers and valves are working. This procedure takes approximately one hour. There are no restrictions for this procedure. Please do NOT wear cologne, perfume, aftershave, or lotions (deodorant is allowed). Please arrive 15 minutes prior to your appointment time.  Non-Cardiac CT Angiography (CTA), is a special type of CT scan that uses a computer to produce multi-dimensional views of major blood vessels throughout the body. In CT angiography, a contrast material is injected through an IV to help visualize the blood vessels    Follow-Up: At [facility], you and your health needs are our priority.  As part of our continuing mission to provide you with exceptional heart care, we have created designated Provider Care Teams.  These Care Teams include your primary Cardiologist (physician) and Advanced Practice Providers (APPs -  Physician Assistants and Nurse Practitioners) who all work together to provide you with the care you need, when you need  it.  We recommend signing up for the patient portal called "MyChart".  Sign up information is provided on this After Visit Summary.  MyChart is used to connect with patients for Virtual Visits (Telemedicine).  Patients are able to view lab/test results, encounter notes, upcoming appointments, etc.  Non-urgent messages can be sent to your provider as well.   To learn more about what you can do with MyChart, go to [email or web address].    Your next appointment:   6-8 week tele-health visit - Dr Olin Pia scheduler will call you to schedule.  Discussed salt substitutes with a  goal of 2-3 gms of Sodium or 5-7 gm of sodium Chloride daily.  Rehydration solutions include Liquid IV, NUUN, TriOral, Normralyte pedialyte advanced care and Banana Bags.  Salt tablets include plain salt tablets, SaltStick Vitassium, thermatabs amongst others.   The higher sodium concentration per serving on this list is normalyte about 800 mg of sodium per serving LMNT1000 mg and most recently of worried about Within You hydration formula also at 1000 mg  Salt supplements are best used with adjunctive sugar  Also discussed the role of compression wear including thigh sleeves and abdominal binders.  Calf compression is not specifically recommended.Marland Kitchen

## 2022-12-16 ENCOUNTER — Other Ambulatory Visit: Payer: Self-pay | Admitting: *Deleted

## 2022-12-16 MED ORDER — FLUTICASONE PROPIONATE HFA 110 MCG/ACT IN AERO: 2.0000 | INHALATION_SPRAY | Freq: Two times a day (BID) | RESPIRATORY_TRACT | 5 refills | Status: DC

## 2022-12-17 ENCOUNTER — Other Ambulatory Visit (HOSPITAL_COMMUNITY): Payer: Self-pay

## 2022-12-21 ENCOUNTER — Other Ambulatory Visit (HOSPITAL_COMMUNITY): Payer: Self-pay

## 2022-12-24 DIAGNOSIS — F321 Major depressive disorder, single episode, moderate: Secondary | ICD-10-CM | POA: Diagnosis not present

## 2022-12-30 ENCOUNTER — Telehealth: Payer: Self-pay | Admitting: Allergy

## 2022-12-30 MED ORDER — ASMANEX HFA 100 MCG/ACT IN AERO: 2.0000 | INHALATION_SPRAY | Freq: Two times a day (BID) | RESPIRATORY_TRACT | 3 refills | Status: DC

## 2022-12-30 NOTE — Telephone Encounter (Signed)
Patient states Archivist, pharmacy sent a request for a drug change.

## 2022-12-30 NOTE — Telephone Encounter (Signed)
Forwarding message to provider for alternative.

## 2022-12-30 NOTE — Telephone Encounter (Signed)
Sent in Asmanex.  If too expensive, check pricing on: Arnuity Qvar Alvesco Pulmicort

## 2023-01-07 ENCOUNTER — Ambulatory Visit (HOSPITAL_COMMUNITY): Payer: BC Managed Care – PPO | Attending: Cardiology

## 2023-01-07 DIAGNOSIS — G90A Postural orthostatic tachycardia syndrome (POTS): Secondary | ICD-10-CM | POA: Diagnosis not present

## 2023-01-07 DIAGNOSIS — F321 Major depressive disorder, single episode, moderate: Secondary | ICD-10-CM | POA: Diagnosis not present

## 2023-01-07 LAB — ECHOCARDIOGRAM COMPLETE
Area-P 1/2: 3.99 cm2
S' Lateral: 2.9 cm

## 2023-01-10 MED ORDER — BECLOMETHASONE DIPROP HFA 80 MCG/ACT IN AERB: 2.0000 | INHALATION_SPRAY | Freq: Two times a day (BID) | RESPIRATORY_TRACT | 5 refills | Status: DC

## 2023-01-10 NOTE — Addendum Note (Signed)
Addended by: Ellamae Sia on: 01/10/2023 01:45 PM   Modules accepted: Orders

## 2023-01-10 NOTE — Telephone Encounter (Signed)
I called the patient to inform her that Dr. Selena Batten sent in Qvar 80 to the [facility]. Patient has no further questions or concerns.

## 2023-01-10 NOTE — Telephone Encounter (Signed)
Sent in rx for qvar to [facility]

## 2023-01-10 NOTE — Telephone Encounter (Signed)
Kathryn Park called in and states [facility] does not have Asmanex, they are on backorder and have no idea when they will get more in.  Kathryn Park would like [place] to try [facility] at [address]

## 2023-01-12 ENCOUNTER — Ambulatory Visit (HOSPITAL_COMMUNITY)
Admission: RE | Admit: 2023-01-12 | Discharge: 2023-01-12 | Disposition: A | Discharge status: Home / Self Care | Payer: BC Managed Care – PPO | Source: Ambulatory Visit | Attending: Internal Medicine | Admitting: Internal Medicine

## 2023-01-12 DIAGNOSIS — G90A Postural orthostatic tachycardia syndrome (POTS): Secondary | ICD-10-CM

## 2023-01-12 DIAGNOSIS — I700 Atherosclerosis of aorta: Secondary | ICD-10-CM | POA: Diagnosis not present

## 2023-01-12 DIAGNOSIS — Q796 Ehlers-Danlos syndrome, unspecified: Secondary | ICD-10-CM | POA: Diagnosis not present

## 2023-01-12 DIAGNOSIS — Q8740 Marfan's syndrome, unspecified: Secondary | ICD-10-CM | POA: Insufficient documentation

## 2023-01-12 MED ORDER — IOHEXOL 350 MG/ML SOLN
75.0000 mL | Freq: Once | INTRAVENOUS | Status: AC | PRN
  Administered 2023-01-12: 75 mL via INTRAVENOUS

## 2023-01-18 ENCOUNTER — Encounter: Payer: Self-pay | Admitting: Allergy

## 2023-01-18 NOTE — Telephone Encounter (Signed)
Reviewed w/Nicole - copies made to be sent to [facility] and copy mailed to patient.

## 2023-01-18 NOTE — Telephone Encounter (Signed)
Please mail printed lab results.  Thank you.

## 2023-01-21 DIAGNOSIS — F321 Major depressive disorder, single episode, moderate: Secondary | ICD-10-CM | POA: Diagnosis not present

## 2023-01-24 ENCOUNTER — Ambulatory Visit (INDEPENDENT_AMBULATORY_CARE_PROVIDER_SITE_OTHER): Payer: BC Managed Care – PPO

## 2023-01-24 ENCOUNTER — Ambulatory Visit (INDEPENDENT_AMBULATORY_CARE_PROVIDER_SITE_OTHER): Payer: BC Managed Care – PPO | Admitting: Family Medicine

## 2023-01-24 VITALS — BP 112/72 | HR 75 | Ht 67.5 in | Wt 142.0 lb

## 2023-01-24 DIAGNOSIS — R0781 Pleurodynia: Secondary | ICD-10-CM | POA: Diagnosis not present

## 2023-01-24 NOTE — Patient Instructions (Addendum)
Thank you for coming in today.   Try heating pad.  Tylenol and arthritis.   Consider rib binder.   Consider muscle relaxer at bedtime. Just let me know.   Try to stay active.   Keep me updated.

## 2023-01-24 NOTE — Progress Notes (Signed)
   Rubin Payor, PhD, LAT, ATC acting as a scribe for [name], MD.  Kathryn Park is a 37 y.o. female who presents to [facility] at [facility] today for rib pain. Pt was previously seen by Dr. Denyse Amass on 06/10/22 for evaluation of her EDS.   Today, pt reports rib pain ongoing since Friday. She had a similar issue over the, while sick w/ COVID, coughing, and experienced a "pop" in her rib cage. Pt locates pain to the L side of her rib cage, along the lateral aspect. She notes she is having an overall sense of "lose joint" esp in her L shoulder.   Treatments tried: inhaler  Pertinent review of systems: No fevers or chills  Relevant historical information: Ehlers-Danlos syndrome   Exam:  BP 112/72   Pulse 75   Ht 5' 7.5" (1.715 m)   Wt 142 lb (64.4 kg)   SpO2 99%   BMI 21.91 kg/m  General: Well Developed, well nourished, and in no acute distress.   MSK: Left rib margin tender palpation.  Normal shoulder motion pain with abduction.  Strength is intact.  Lungs: Clear to auscultation bilaterally.    Lab and Radiology Results  X-ray images chest and left ribs obtained today personally and independently interpreted. No rib fracture is visible.  No pneumothorax or pleural effusion present.  No acute findings visible. Await formal radiology review    Assessment and Plan: 37 y.o. female with left rib pain occurring following a cough.  Pain is thought to be due to intercostal muscle injury or perhaps serratus anterior muscle injury with spasm.  Plan for heating pad Tylenol and ibuprofen.  We talked about muscle relaxers and she is worried about them.  Happy to prescribe muscle relaxer for bedtime use as needed in the future if needed. Consider aquatic physical therapy for this and other lingering issues not addressed with conventional physical therapy in the past.   PDMP not reviewed this encounter. Orders Placed This Encounter  Procedures    DG Ribs Unilateral W/Chest Left    Standing Status:   Future    Number of Occurrences:   1    Standing Expiration Date:   02/23/2023    Order Specific Question:   Reason for Exam (SYMPTOM  OR DIAGNOSIS REQUIRED)    Answer:   left sided rib pain    Order Specific Question:   Preferred imaging location?    Answer:   [facility]    Order Specific Question:   Is patient pregnant?    Answer:   No   No orders of the defined types were placed in this encounter.    Discussed warning signs or symptoms. Please see discharge instructions. Patient expresses understanding.   The above documentation has been reviewed and is accurate and complete [name], M.D.

## 2023-01-26 ENCOUNTER — Encounter: Payer: Self-pay | Admitting: Physician Assistant

## 2023-01-26 ENCOUNTER — Telehealth (INDEPENDENT_AMBULATORY_CARE_PROVIDER_SITE_OTHER): Payer: BC Managed Care – PPO | Admitting: Physician Assistant

## 2023-01-26 VITALS — Ht 67.5 in | Wt 142.0 lb

## 2023-01-26 DIAGNOSIS — F909 Attention-deficit hyperactivity disorder, unspecified type: Secondary | ICD-10-CM

## 2023-01-26 DIAGNOSIS — Q796 Ehlers-Danlos syndrome, unspecified: Secondary | ICD-10-CM

## 2023-01-26 MED ORDER — BUPROPION HCL ER (XL) 150 MG PO TB24: 150.0000 mg | ORAL_TABLET | Freq: Every day | ORAL | 1 refills | Status: DC

## 2023-01-26 NOTE — Progress Notes (Signed)
I acted as a Neurosurgeon for [employer], PA-C Corky Mull, LPN  Virtual Visit via Video Note   I, [name], PA, connected with  Kathryn Park  ([id number], July 16, 1986) on 01/26/23 at  8:00 AM EDT by a video-enabled telemedicine application and verified that I am speaking with the correct person using two identifiers.  Location: Patient: Home Provider: [place] office   I discussed the limitations of evaluation and management by telemedicine and the availability of in person appointments. The patient expressed understanding and agreed to proceed.    History of Present Illness: Kathryn Park is a 37 y.o. who identifies as a female who was assigned female at birth, and is being seen today for ADHD and Genetic Counseling.  ADHD Was unable to tolerate Adderall due to jaw clenching She is interested in trialing Wellbutrin for it's off-label use of ADHD Her anxiety is currently relatively well controlled - she is taking Lexapro at times in her cycle for PMDD and taking buspar rarely Denies Si/HI  EDS She is hoping to pursue genetic testing for connective tissue diagnosis. She has read that certain vitamins can be beneficial for certain types of EDS   Problems:  Patient Active Problem List   Diagnosis Date Noted   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 09/29/2022   Other adverse food reactions, not elsewhere classified, subsequent encounter 09/29/2022   Other allergic rhinitis 09/29/2022   Rash and other nonspecific skin eruption 09/29/2022   Pruritus 09/29/2022   Mild intermittent asthma without complication 09/29/2022   Gastroesophageal reflux disease 09/29/2022   EDS (Ehlers-Danlos syndrome) 06/10/2022   Tear of right acetabular labrum 07/22/2021   Breast lump 02/12/2021   Bilateral hip pain 11/25/2020   Positive ANA (antinuclear antibody) 11/25/2020   Other fatigue 11/25/2020   Bladder retention 10/13/2020   HSV-1  (herpes simplex virus 1) infection 10/13/2020   History of anorexia nervosa 10/13/2020   GAD (generalized anxiety disorder) 10/13/2020   Family history of breast cancer    Family history of kidney cancer    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 Rinse mouth after each use., Disp: 1 each, Rfl: 5   buPROPion (WELLBUTRIN XL) 150 MG 24 hr tablet, Take 1 tablet (150 mg total) by mouth daily., Disp: 30 tablet, Rfl: 1   busPIRone (BUSPAR) 5 MG tablet, Take 1 tablet (5 mg total) by mouth 3 (three) times daily as needed (anxiety)., Disp: 90 tablet, Rfl: 1   cyanocobalamin 1000 MCG tablet, Take 1,000 mcg by mouth daily., Disp: , Rfl:    EPINEPHrine 0.3 mg/0.3 mL IJ SOAJ injection, Inject 0.3 mg into the muscle as needed for anaphylaxis., Disp: 2 each, Rfl: 1   escitalopram (LEXAPRO) 20 MG tablet, Take 1/2 tablet ( ) tablet by mouth daily for 2 weeks out of cycle, Disp: 45  tablet, Rfl: 0   famotidine (PEPCID) 20 MG tablet, Take 1 tablet (20 mg total) by mouth 2 (two) times daily., Disp: 60 tablet, Rfl: 3   magnesium oxide (MAG-OX) 400 (240 Mg) MG tablet, Take 400 mg by mouth daily., Disp: , Rfl:    Melatonin 10 MG TABS, Take 1 tablet by mouth at bedtime., Disp: , Rfl:    Multiple Vitamins-Minerals (MULTIPLE VITAMINS/WOMENS PO), Take 1 tablet by mouth daily in the afternoon., Disp: , Rfl:    valACYclovir  (VALTREX) 1000 MG tablet, Take 2 tablets (2000 mg) by mouth twice a day for 24 hours as needed for cold sore., Disp: 30 tablet, Rfl: 1  Observations/Objective: Patient is well-developed, well-nourished in no acute distress.  Resting comfortably at home.  Head is normocephalic, atraumatic.  No labored breathing.  Speech is clear and coherent with logical content.  Patient is alert and oriented at baseline.   Assessment and Plan: 1. Attention deficit hyperactivity disorder (ADHD), unspecified ADHD type No red flags Will trial Wellbutrin 150 mg XL daily Denies Si/HI I discussed with patient that if they develop any SI, to tell someone immediately and seek medical attention. Side effects/benefits discussed Follow-up in 1 month to check in on this, either through [place] message or office visit  2. EDS (Ehlers-Danlos syndrome) Ongoing I have reached out to our genetic counselor to see where we can potentially send her for this sort of testing  Will send mychart message with response/ideas/plan   Follow Up Instructions: I discussed the assessment and treatment plan with the patient. The patient was provided an opportunity to ask questions and all were answered. The patient agreed with the plan and demonstrated an understanding of the instructions.  A copy of instructions were sent to the patient via MyChart unless otherwise noted below.   The patient was advised to call back or seek an in-person evaluation if the symptoms worsen or if the condition fails to improve as anticipated.  [name], [place]

## 2023-01-26 NOTE — Progress Notes (Signed)
Left rib x-ray shows no fractures.

## 2023-01-31 ENCOUNTER — Telehealth: Payer: Self-pay

## 2023-01-31 ENCOUNTER — Telehealth: Payer: Self-pay | Admitting: Internal Medicine

## 2023-01-31 NOTE — Telephone Encounter (Signed)
Attempted phone call to pt and left voicemail to contact RN at [phone] and will also release results for review on MyChart.  See result for complete details.

## 2023-01-31 NOTE — Telephone Encounter (Signed)
Patient was calling to get her echo results. Please call back

## 2023-01-31 NOTE — Telephone Encounter (Signed)
-----   Message from Duke Salvia, MD sent at 01/21/2023  3:24 PM EDT ----- Please Inform Patient  Imaging demonstrates no abnormality of the thoracic aorta in terms of dilatation, there is noted to be benign anatomical variation, 1 that is common representing about 1 out of 10, of the left (normal) aortic arch and that 2 vessels 1 to the arm and the 1 to the brain, from the common trunk instead of each vein is only origin.  This has no consequence  Thanks

## 2023-02-01 NOTE — Telephone Encounter (Signed)
Attempted phone call to pt to discuss CT results.  Pt has also been notified via MyChart.  See result note for complete details.

## 2023-02-10 ENCOUNTER — Encounter: Payer: Self-pay | Admitting: Internal Medicine

## 2023-02-10 ENCOUNTER — Ambulatory Visit: Payer: BC Managed Care – PPO | Attending: Internal Medicine | Admitting: Internal Medicine

## 2023-02-10 ENCOUNTER — Telehealth: Payer: Self-pay

## 2023-02-10 ENCOUNTER — Other Ambulatory Visit: Payer: Self-pay | Admitting: Obstetrics and Gynecology

## 2023-02-10 ENCOUNTER — Telehealth: Payer: Self-pay | Admitting: Internal Medicine

## 2023-02-10 VITALS — BP 119/69 | HR 67 | Ht 67.5 in | Wt 142.0 lb

## 2023-02-10 DIAGNOSIS — G901 Familial dysautonomia [Riley-Day]: Secondary | ICD-10-CM

## 2023-02-10 DIAGNOSIS — Q796 Ehlers-Danlos syndrome, unspecified: Secondary | ICD-10-CM

## 2023-02-10 DIAGNOSIS — Z1231 Encounter for screening mammogram for malignant neoplasm of breast: Secondary | ICD-10-CM

## 2023-02-10 NOTE — Patient Instructions (Signed)
Medication Instructions:  Your physician recommends that you continue on your current medications as directed. Please refer to the Current Medication list given to you today.  *If you need a refill on your cardiac medications before your next appointment, please call your pharmacy*   Lab Work: None ordered.  If you have labs (blood work) drawn today and your tests are completely normal, you will receive your results only by: MyChart Message (if you have MyChart) OR A paper copy in the mail If you have any lab test that is abnormal or we need to change your treatment, we will call you to review the results.   Testing/Procedures: None ordered.    Follow-Up: At [facility], you and your health needs are our priority.  As part of our continuing mission to provide you with exceptional heart care, we have created designated Provider Care Teams.  These Care Teams include your primary Cardiologist (physician) and Advanced Practice Providers (APPs -  Physician Assistants and Nurse Practitioners) who all work together to provide you with the care you need, when you need it.  We recommend signing up for the patient portal called "MyChart".  Sign up information is provided on this After Visit Summary.  MyChart is used to connect with patients for Virtual Visits (Telemedicine).  Patients are able to view lab/test results, encounter notes, upcoming appointments, etc.  Non-urgent messages can be sent to your provider as well.   To learn more about what you can do with MyChart, go to [email or web address].    Your next appointment:   12 week telephone visit with Dr Graciela Husbands

## 2023-02-10 NOTE — Telephone Encounter (Signed)
Called pt to get pt ready for virtual visit with Dr. Graciela Husbands. Patient did not answer call and voicemail was left.

## 2023-02-10 NOTE — Telephone Encounter (Signed)
Pt was returning CMA's call regarding prep for virtual visit with MD. Pt is requesting a callback and stated she'll have her phone on her. Please advise

## 2023-02-10 NOTE — Progress Notes (Signed)
Electrophysiology TeleHealth Note      Date:  02/10/2023   ID:  Kathryn Park, DOB 1986-04-28, MRN [id number]  Location: patient's home  Provider location: [address] [place]  Evaluation Performed: Follow-up visit  PCP:  [name], PA  Cardiologist:     Electrophysiologist:  SK   Chief Complaint:  POTS  History of Present Illness:    Kathryn Park is a 37 y.o. female who presents via audio/video conferencing for a telehealth visit today.  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  She is increased her sodium intake vigorously.  Her mornings are the worst with nausea and "brain fog "  Some increased exercise.  Still menstruating, symptoms worsened with this, and is seeing GYN in the next little bit of time.  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 shared origin of the left brachiocephalic and common carotid arteries)  The patient denies symptoms of fevers, chills, cough, or new SOB worrisome for COVID 19.   Past Medical History:  Diagnosis Date   Abnormal Pap smear of cervix    Allergy    Anemia    suspected poor diet; vegetarian during teens   Anxiety    Asthma    childhood   Depression    Dysmenorrhea    Family history of breast cancer    Family history of kidney cancer    Granulosa cell tumor of ovary    History of iron deficiency anemia    age 44   Joint pain    Labral tear of right hip joint    Leukocytosis    Low vitamin D level    Migraine with aura    never on rx or saw neurology   Ovarian cancer (HCC) 2021   Ovarian cyst     Left   Pneumonia 01/2012    left lower lobe   Pre-diabetes 2015   had a "borderline" A1c   STD (sexually transmitted disease)    HSV1   Wears contact lenses    Wears glasses     Past Surgical History:  Procedure Laterality Date   ADENOIDECTOMY     BREAST BIOPSY Right 04/2020   benign   COLPOSCOPY     LAPAROSCOPIC OVARIAN CYSTECTOMY Left 11/13/2019   Procedure: LAPAROSCOPIC OVARIAN CYSTECTOMY/POSSIBLE LEFT OOPHORECTOMY/ COLLECTION OF PELVIC WASHINGS/ks;  Surgeon: Patton Salles, MD;  Location: [facility];  Service: Gynecology;  Laterality: Left;  Possible left oophorectomy Colection of pelvic washings To follow first case of the day   LEFT OOPHORECTOMY Left 11/2019   right hip arthroscopy  01/2022   for laberal tear repair   TONSILLECTOMY     UPPER GI ENDOSCOPY      Current Outpatient Medications  Medication Sig Dispense Refill   albuterol (VENTOLIN HFA) 108 (90 Base) MCG/ACT inhaler Inhale 2 puffs into the lungs every 4 (four) hours as needed for wheezing or shortness of breath (coughing fits). 18 g 1   beclomethasone (QVAR) 80 MCG/ACT inhaler Inhale 2 puffs into the lungs 2 (two) times daily. Rinse mouth after each use. 1  each 5   buPROPion (WELLBUTRIN XL) 150 MG 24 hr tablet Take 1 tablet (150 mg total) by mouth daily. 30 tablet 1   busPIRone (BUSPAR) 5 MG tablet Take 1 tablet (5 mg total) by mouth 3 (three) times daily as needed (anxiety). 90 tablet 1   cyanocobalamin 1000 MCG tablet Take 1,000 mcg by mouth daily.     EPINEPHrine 0.3 mg/0.3 mL IJ SOAJ injection Inject 0.3 mg into the muscle as needed for anaphylaxis. 2 each 1   escitalopram (LEXAPRO) 20 MG tablet Take 1/2 tablet (10mg ) tablet by mouth daily for 2 weeks out of cycle 45 tablet 0   famotidine (PEPCID) 20 MG tablet Take 1 tablet (20 mg total) by mouth 2 (two) times daily. 60 tablet 3   fexofenadine (ALLEGRA) 180 MG tablet Take 180 mg by mouth in the morning and at bedtime.      magnesium oxide (MAG-OX) 400 (240 Mg) MG tablet Take 400 mg by mouth daily.     Melatonin 10 MG TABS Take 1 tablet by mouth at bedtime.     Multiple Vitamins-Minerals (MULTIPLE VITAMINS/WOMENS PO) Take 1 tablet by mouth daily in the afternoon.     valACYclovir (VALTREX) 1000 MG tablet Take 2 tablets (2000 mg) by mouth twice a day for 24 hours as needed for cold sore. 30 tablet 1   No current facility-administered medications for this visit.    Allergies:   Shellfish allergy, Other, Latex, and Sulfa antibiotics      ROS:  Please see the history of present illness.   All other systems are personally reviewed and negative.    Exam:    Vital Signs:  BP 119/69   Pulse 67   Ht 5' 7.5" (1.715 m)   Wt 142 lb (64.4 kg)   LMP 01/04/2023 (Approximate)   BMI 21.91 kg/m       Labs/Other Tests and Data Reviewed:    Recent Labs: 09/24/2022: TSH 1.490 09/29/2022: ALT 13; BUN 13; Creatinine, Ser 0.66; Hemoglobin 13.9; Platelets 256; Potassium 4.2; Sodium 139   Wt Readings from Last 3 Encounters:  02/10/23 142 lb (64.4 kg)  01/26/23 142 lb (64.4 kg)  01/24/23 142 lb (64.4 kg)     Other studies personally reviewed: Additional studies/ records that were reviewed today include      ASSESSMENT & PLAN:    Dysautonomia   Gait disturbance disorder   Ovarian cancer history-granulosa cell tumor   Possible and mast cell activation syndrome   Ehlers-Danlos syndrome with history of multiple joint subluxation   Arachnodactyly   Improved salt and water intake with decrease symptoms.  Encouraged her to further increase her sodium intake towards 5-7 g  daily.  Rehydration solutions include Liquid IV, NUUN, TriOral, Normralyte pedialyte advanced care and Banana Bags.  Salt tablets include plain salt tablets, SaltStick Vitassium, thermatabs amongst others.   The higher sodium concentration per serving on this list is normalyte about 800 mg of sodium per serving LMNT1000 mg and most recently of  worried about Within You hydration formula also at 1000 mg  Salt supplements are best used with adjunctive sugar    Early a.m. nausea, have suggested raising the head of the bed 2 to 4 inches as well as Zofran 4 mg upon awakening.  She has GI follow-up already scheduled.  Has GYN follow-up scheduled also to consider menses suppression  .  Blood pressures are better.  This allows [place] to consider increasing sodium intake and/or trying fludrocortisone  Follow-up:  10-12w telehealth visit      Current medicines are reviewed at length with the patient today.   The patient  concerns regarding her medicines.  The following changes were made today:  none  Labs/ tests ordered today include: none  No orders of the defined types were placed in this encounter.     Today, I have spent 22 minutes with the patient with telehealth technology discussing the above.  Signed, Sherryl Manges, MD  02/10/2023 1:36 PM     [facility] [address] [place] [address] [phone] (office) [phone] (fax)

## 2023-02-11 DIAGNOSIS — F321 Major depressive disorder, single episode, moderate: Secondary | ICD-10-CM | POA: Diagnosis not present

## 2023-02-18 DIAGNOSIS — F321 Major depressive disorder, single episode, moderate: Secondary | ICD-10-CM | POA: Diagnosis not present

## 2023-02-21 ENCOUNTER — Telehealth (INDEPENDENT_AMBULATORY_CARE_PROVIDER_SITE_OTHER): Payer: BC Managed Care – PPO | Admitting: Physician Assistant

## 2023-02-21 ENCOUNTER — Encounter: Payer: Self-pay | Admitting: Physician Assistant

## 2023-02-21 VITALS — Ht 67.5 in | Wt 138.0 lb

## 2023-02-21 DIAGNOSIS — F411 Generalized anxiety disorder: Secondary | ICD-10-CM | POA: Diagnosis not present

## 2023-02-21 DIAGNOSIS — F909 Attention-deficit hyperactivity disorder, unspecified type: Secondary | ICD-10-CM

## 2023-02-21 NOTE — Progress Notes (Signed)
I acted as a Neurosurgeon for [employer], PA-C Corky Mull, LPN  Virtual Visit via Video Note   I, [name], PA, connected with  Kathryn Park  ([id number], [birth year]) on 02/21/23 at  8:40 AM EDT by a video-enabled telemedicine application and verified that I am speaking with the correct person using two identifiers.  Location: Patient: Home Provider: [facility] office   I discussed the limitations of evaluation and management by telemedicine and the availability of in person appointments. The patient expressed understanding and agreed to proceed.    History of Present Illness: Kathryn Park is a 37 y.o. who identifies as a female who was assigned female at birth, and is being seen today for ADHD. Pt is currently taking Wellbutrin 150 mg daily for ADHD and it is not helping. Pt would like to discuss other medications.  Did not like taking Wellbutrin -- when not taking Lexapro with it, had SI. Stopped taking this a few days ago after talking to her husband and therapist. Did not act on any SI/HI.  Hoping to connect with her gynecology-oncology soon to discuss possible causes/treatments of hormonal shifts that could be at play.  Problems:  Patient Active Problem List   Diagnosis Date Noted   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 09/29/2022   Other adverse food reactions, not elsewhere classified, subsequent encounter 09/29/2022   Other allergic rhinitis 09/29/2022   Rash and other nonspecific skin eruption 09/29/2022   Pruritus 09/29/2022   Mild intermittent asthma without complication 09/29/2022   Gastroesophageal reflux disease 09/29/2022   EDS (Ehlers-Danlos syndrome) 06/10/2022   Tear of right acetabular labrum 07/22/2021   Breast lump 02/12/2021   Bilateral hip pain 11/25/2020   Positive ANA (antinuclear antibody) 11/25/2020   Other fatigue 11/25/2020   Bladder retention 10/13/2020   HSV-1 (herpes simplex virus 1)  infection 10/13/2020   History of anorexia nervosa 10/13/2020   GAD (generalized anxiety disorder) 10/13/2020   Family history of breast cancer    Family history of kidney cancer    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 Rinse mouth after each use., Disp: 1 each, Rfl: 5   buPROPion (WELLBUTRIN XL) 150 MG 24 hr tablet, Take 1 tablet (150 mg total) by mouth daily., Disp: 30 tablet, Rfl: 1   busPIRone (BUSPAR) 5 MG tablet, Take 1 tablet (5 mg total) by mouth 3 (three) times daily as needed (anxiety)., Disp: 90 tablet, Rfl: 1   cyanocobalamin 1000 MCG tablet, Take 1,000 mcg by mouth daily., Disp: , Rfl:    EPINEPHrine 0.3 mg/0.3 mL IJ SOAJ injection, Inject 0.3 mg into the muscle as needed for anaphylaxis., Disp: 2 each, Rfl: 1   escitalopram (LEXAPRO) 20 MG tablet, Take 1/2 tablet (10mg ) tablet by mouth daily for 2 weeks out of cycle, Disp: 45 tablet, Rfl:  0   famotidine (PEPCID) 20 MG tablet, Take 1 tablet (20 mg total) by mouth 2 (two) times daily., Disp: 60 tablet, Rfl: 3   fexofenadine (ALLEGRA) 180 MG tablet, Take 180 mg by mouth in the morning and at bedtime., Disp: , Rfl:    magnesium oxide (MAG-OX) 400 (240 Mg) MG tablet, Take 400 mg by mouth daily., Disp: , Rfl:    Melatonin 10 MG TABS, Take 1 tablet by mouth at bedtime., Disp: , Rfl:    Multiple Vitamins-Minerals (MULTIPLE VITAMINS/WOMENS  PO), Take 1 tablet by mouth daily in the afternoon., Disp: , Rfl:    valACYclovir (VALTREX) 1000 MG tablet, Take 2 tablets (2000 mg) by mouth twice a day for 24 hours as needed for cold sore., Disp: 30 tablet, Rfl: 1  Observations/Objective: Patient is well-developed, well-nourished in no acute distress.  Resting comfortably  at home.  Head is normocephalic, atraumatic.  No labored breathing.  Speech is clear and coherent with logical content.  Patient is alert and oriented at baseline.    Assessment and Plan: Attention deficit hyperactivity disorder (ADHD), unspecified ADHD type Uncontrolled She would like to hold off on medications at this time Continue to monitor and she will reach out after speaking with her other provider if she would like to pursue psychiatry management  GAD (generalized anxiety disorder) Overall stable with ongoing diagnosis of Ehlers-Danlos Syndrome and current Lexapro regimen Continue talk therapy Considered taking Lexapro daily but she would like to take as currently prescribe at the moment  Follow Up Instructions: I discussed the assessment and treatment plan with the patient. The patient was provided an opportunity to ask questions and all were answered. The patient agreed with the plan and demonstrated an understanding of the instructions.  A copy of instructions were sent to the patient via MyChart unless otherwise noted below.   The patient was advised to call back or seek an in-person evaluation if the symptoms worsen or if the condition fails to improve as anticipated.  Corky Mull, LPN

## 2023-02-22 ENCOUNTER — Ambulatory Visit
Admission: RE | Admit: 2023-02-22 | Discharge: 2023-02-22 | Disposition: A | Discharge status: Home / Self Care | Payer: BC Managed Care – PPO | Source: Ambulatory Visit | Attending: Obstetrics and Gynecology | Admitting: Obstetrics and Gynecology

## 2023-02-22 DIAGNOSIS — Z1231 Encounter for screening mammogram for malignant neoplasm of breast: Secondary | ICD-10-CM

## 2023-02-23 ENCOUNTER — Encounter: Payer: Self-pay | Admitting: Gynecologic Oncology

## 2023-02-25 ENCOUNTER — Inpatient Hospital Stay: Payer: BC Managed Care – PPO | Attending: Gynecologic Oncology | Admitting: Gynecologic Oncology

## 2023-02-25 ENCOUNTER — Inpatient Hospital Stay: Payer: BC Managed Care – PPO

## 2023-02-25 ENCOUNTER — Encounter: Payer: Self-pay | Admitting: Gynecologic Oncology

## 2023-02-25 ENCOUNTER — Other Ambulatory Visit: Payer: Self-pay

## 2023-02-25 VITALS — BP 129/77 | HR 66 | Temp 98.7°F | Resp 18 | Ht 67.72 in | Wt 142.2 lb

## 2023-02-25 DIAGNOSIS — D3912 Neoplasm of uncertain behavior of left ovary: Secondary | ICD-10-CM

## 2023-02-25 DIAGNOSIS — G43109 Migraine with aura, not intractable, without status migrainosus: Secondary | ICD-10-CM | POA: Diagnosis not present

## 2023-02-25 DIAGNOSIS — Z87891 Personal history of nicotine dependence: Secondary | ICD-10-CM | POA: Diagnosis not present

## 2023-02-25 DIAGNOSIS — G901 Familial dysautonomia [Riley-Day]: Secondary | ICD-10-CM

## 2023-02-25 DIAGNOSIS — N926 Irregular menstruation, unspecified: Secondary | ICD-10-CM

## 2023-02-25 DIAGNOSIS — Z90721 Acquired absence of ovaries, unilateral: Secondary | ICD-10-CM | POA: Diagnosis not present

## 2023-02-25 MED ORDER — NORETHINDRONE 0.35 MG PO TABS
1.0000 | ORAL_TABLET | Freq: Every day | ORAL | 11 refills | Status: DC
Start: 2023-02-25 — End: 2023-09-12

## 2023-02-25 NOTE — Progress Notes (Signed)
Gynecologic Oncology Return Clinic Visit  02/25/23  Reason for Visit: surveillance in the setting of granulosa cell tumor   Treatment History: Oncology History  Granulosa cell tumor of left ovary  10/18/2019 Imaging   Pelvic ultrasound: 11 x 7 x 9 cm left ovarian cyst, avascular with thin septation.  No free fluid.  Right ovary normal in appearance.   11/13/2019 Surgery   Laparoscopic left oophorectomy with collection of pelvic washings, lysis of adhesion.  Findings at the time of surgery were a 12 cm left ovarian cyst.  No ascites or intra-abdominal/pelvic disease.  Ovary was removed in a contained fashion and in an Endo Catch bag.   11/13/2019 Pathology Results   Left ovary showing granulosa cell tumor, 2.1 cm.  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 Pelvic washings negative for malignant cells.   11/13/2019 Initial Diagnosis   Granulosa cell tumor of left ovary   11/29/2019 Imaging   CT A/P: No acute findings in the abdomen or pelvis.  Specifically, no evidence for metastatic disease in the abdomen or pelvis. Trace free fluid in the cul-de-sac.  This can be physiologic in a premenopausal female.   12/27/2019 Tumor Marker   Patient's blood was tested for the following markers: Inh B, AMH Results of the tumor marker test revealed: 83, 1.07.     Interval History: Doing well.  Continues to workup her systemic symptoms.  Saw Dr. Graciela Husbands recently and did not meet criteria for POTS but was diagnosed with dysautonomia.  Thinks there may be some increase in her symptoms related to menstrual cycle.  Denies any abnormal uterine bleeding.  Denies any pelvic or abdominal pain outside of her normal symptoms.  Reports baseline bowel bladder function.  Past Medical/Surgical History: Past Medical History:  Diagnosis Date   Abnormal Pap smear of cervix    Allergy    Anemia    suspected poor diet; vegetarian during teens   Anxiety    Asthma    childhood    Depression    Dysmenorrhea    Family history of breast cancer    Family history of kidney cancer    Granulosa cell tumor of ovary    History of iron deficiency anemia    age 37   Joint pain    Labral tear of right hip joint    Leukocytosis    Low vitamin D level    Migraine with aura    never on rx or saw neurology   Ovarian cancer (HCC) 2021   Ovarian cyst    Left   Pneumonia 01/2012    left lower lobe   Pre-diabetes 2015   had a "borderline" A1c   STD (sexually transmitted disease)    HSV1   Wears contact lenses    Wears glasses     Past Surgical History:  Procedure Laterality Date   ADENOIDECTOMY     BREAST BIOPSY Right 04/2020   benign   COLPOSCOPY     LAPAROSCOPIC OVARIAN CYSTECTOMY Left 11/13/2019   Procedure: LAPAROSCOPIC OVARIAN CYSTECTOMY/POSSIBLE LEFT OOPHORECTOMY/ COLLECTION OF PELVIC WASHINGS/ks;  Surgeon: Patton Salles, MD;  Location: [facility];  Service: Gynecology;  Laterality: Left;  Possible left oophorectomy Colection of pelvic washings To follow first case of the day   LEFT OOPHORECTOMY Left 11/2019   right hip arthroscopy  01/2022   for laberal tear repair   TONSILLECTOMY     UPPER GI ENDOSCOPY      Family History  Problem Relation Age of Onset   Asthma Mother    Allergic rhinitis Mother    Diabetes Mother    Hypertension Mother    Stroke Mother        TIA   Factor V Leiden deficiency Mother    Other Mother        pituitary tumor   Colon polyps Mother    Diverticulitis Mother    Allergic rhinitis Father    Eczema Father    Kidney cancer Father        cancerous tumor of kidney   Ulcerative colitis Father    Allergic rhinitis Brother    Eczema Brother    Other Brother        ear tumor, dx. age 68-6   Breast cancer Maternal Grandmother        dx. 80s   Stroke Maternal Grandfather    Rheum arthritis Maternal Great-grandmother    Cancer Other    Ovarian cancer Neg Hx    Endometrial cancer Neg Hx     Colon cancer Neg Hx     Social History   Socioeconomic History   Marital status: Married    Spouse name: Not on file   Number of children: 0   Years of education: Not on file   Highest education level: Not on file  Occupational History    Comment: part time [employer]  Tobacco Use   Smoking status: Former    Packs/day: 1    Types: Cigarettes    Passive exposure: Past   Smokeless tobacco: Never  Vaping Use   Vaping Use: Never used  Substance and Sexual Activity   Alcohol use: Not Currently   Drug use: No   Sexual activity: Yes    Partners: Male    Birth control/protection: Other-see comments, Surgical    Comment: vasectomy  Other Topics Concern   Not on file  Social History Narrative   Moved from [place]   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   Social Determinants of Corporate investment banker Strain: Not on file  Food Insecurity: Not on file  Transportation Needs: Not on file  Physical Activity: Not on file  Stress: Not on file  Social Connections: Not on file    Current Medications:  Current Outpatient Medications:    albuterol (VENTOLIN HFA) 108 (90 Base) MCG/ACT inhaler, Inhale 2 puffs into the lungs every 4 (four) hours as needed for wheezing or shortness of breath (coughing fits)., Disp: 18 g, Rfl: 1   beclomethasone (QVAR) 80 MCG/ACT inhaler, Inhale 2 puffs into the lungs 2 (two) times daily. Rinse mouth after each use., Disp: 1 each, Rfl: 5   busPIRone (BUSPAR) 5 MG tablet, Take 1 tablet (5 mg total) by mouth 3 (three) times daily as needed (anxiety)., Disp: 90 tablet, Rfl: 1   cyanocobalamin 1000 MCG tablet, Take 1,000 mcg by mouth daily., Disp: , Rfl:    EPINEPHrine 0.3 mg/0.3 mL IJ SOAJ injection, Inject 0.3 mg into the muscle as needed for anaphylaxis., Disp: 2 each, Rfl: 1   escitalopram (LEXAPRO) 20 MG tablet, Take 1/2 tablet (10mg ) tablet by mouth daily for 2 weeks out of cycle, Disp: 45 tablet, Rfl: 0   famotidine  (PEPCID) 20 MG tablet, Take 1 tablet (20 mg total) by mouth 2 (two) times daily., Disp: 60 tablet, Rfl: 3   fexofenadine (ALLEGRA) 180 MG tablet, Take 180 mg by mouth in the morning and at  bedtime., Disp: , Rfl:    magnesium oxide (MAG-OX) 400 (240 Mg) MG tablet, Take 400 mg by mouth daily., Disp: , Rfl:    Melatonin 10 MG TABS, Take 1 tablet by mouth at bedtime., Disp: , Rfl:    Multiple Vitamins-Minerals (MULTIPLE VITAMINS/WOMENS PO), Take 1 tablet by mouth daily in the afternoon., Disp: , Rfl:    norethindrone (ORTHO MICRONOR) 0.35 MG tablet, Take 1 tablet (0.35 mg total) by mouth daily., Disp: 28 tablet, Rfl: 11   valACYclovir (VALTREX) 1000 MG tablet, Take 2 tablets (2000 mg) by mouth twice a day for 24 hours as needed for cold sore., Disp: 30 tablet, Rfl: 1   buPROPion (WELLBUTRIN XL) 150 MG 24 hr tablet, Take 1 tablet (150 mg total) by mouth daily. (Patient not taking: Reported on 02/23/2023), Disp: 30 tablet, Rfl: 1  Review of Systems: Denies appetite changes, fevers, chills, fatigue, unexplained weight changes. Denies hearing loss, neck lumps or masses, mouth sores, ringing in ears or voice changes. Denies cough or wheezing.  Denies shortness of breath. Denies chest pain or palpitations. Denies leg swelling. Denies abdominal distention, pain, blood in stools, constipation, diarrhea, nausea, vomiting, or early satiety. Denies pain with intercourse, dysuria, frequency, hematuria or incontinence. Denies hot flashes, pelvic pain, vaginal bleeding or vaginal discharge.   Denies joint pain, back pain or muscle pain/cramps. Denies itching, rash, or wounds. Denies dizziness, headaches, numbness or seizures. Denies swollen lymph nodes or glands, denies easy bruising or bleeding. Denies anxiety, confusion, or decreased concentration.  Physical Exam: BP 129/77   Pulse 66   Temp 98.7 F (37.1 C) (Oral)   Resp 18   Ht 5' 7.72" (1.72 m)   Wt 142 lb 3.2 oz (64.5 kg)   LMP 02/08/2023 (Exact  Date)   SpO2 99%   BMI 21.80 kg/m  General: Alert, oriented, no acute distress. HEENT: Normocephalic, atraumatic, sclera anicteric. Chest: Clear to auscultation bilaterally.  No wheezes or rhonchi. Cardiovascular: Regular rate and rhythm, no murmurs. Abdomen: soft, nontender.  Normoactive bowel sounds.  No masses or hepatosplenomegaly appreciated.  Well-healed incisions. Extremities: Grossly normal range of motion.  Warm, well perfused.  No edema bilaterally. Skin: No rashes or lesions noted. Lymphatics: No cervical, supraclavicular, or inguinal adenopathy. GU: Normal appearing external genitalia without erythema, excoriation, or lesions.  Speculum exam reveals well rugated vaginal mucosa, no lesions or masses.  Cervix is normal in appearance. Bimanual exam reveals small mobile uterus, no adnexal masses appreciated, no nodularity.  Rectovaginal exam confirms these findings.    Laboratory & Radiologic Studies: 08/2022: pap NIML, HR HPV negative           Component Ref Range & Units 6 mo ago (08/25/22) 1 yr ago (01/11/22) 1 yr ago (08/10/21) 2 yr ago (02/13/21) 2 yr ago (08/18/20) 3 yr ago (02/20/20) 3 yr ago (11/22/19)  Inhibin B pg/mL 8.7 56.0 CM 32.7 CM 48.0 CM 71.0 CM 14.9 CM 83.0 C   Component Ref Range & Units 6 mo ago (08/25/22) 1 yr ago (01/11/22) 1 yr ago (08/10/21) 2 yr ago (02/13/21) 2 yr ago (08/18/20) 3 yr ago (02/20/20) 3 yr ago (11/22/19)  ANTI-MULLERIAN HORMONE (AMH) ng/mL 1.33 1.70 CM 0.471 CM 2.57 CM 3.01 CM 2.08 CM 1.07 C   Assessment & Plan: Kathryn Park is a 37 y.o. 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 Last ultrasound 08/2022: normal right ovary.   Patient is overall doing well.  She is NED on exam today.  We discussed again her continued workup with regards to systemic and GI symptoms.  She has now seen a neurologist.  She has been diagnosed with dysautonomia.   Previously  discussed progestin therapy in the setting of her menstrual changes.  She has migraines with aura and I discussed that I would not recommend combined oral contraceptive pills.  Given possible relation to dysautonomia symptoms and menstrual cycles, we discussed again trial of progesterone only pills.  She was interested in doing this.  Prescription for norethindrone was sent to her pharmacy.   We reviewed signs and symptoms again that would be concerning for disease recurrence.  Per SGO surveillance recommendations, we will continue on visits every 6 months.  She knows to call if she develops any symptoms before her next scheduled visit.  I will plan to repeat labs at her next visit.  No routine imaging ordered at this time.  We discussed that typically, we would image for change in symptoms or tumor marker.  22 minutes of total time was spent for this patient encounter, including preparation, face-to-face counseling with the patient and coordination of care, and documentation of the encounter.  Eugene Garnet, MD  Division of Gynecologic Oncology  Department of Obstetrics and Gynecology  [facility]

## 2023-02-25 NOTE — Patient Instructions (Signed)
It was good to see you today.  I do not see or feel any evidence of cancer recurrence on your exam.  I will see you for follow-up in 6 months. I will release labs to you when back next week.  As always, if you develop any new and concerning symptoms before your next visit, please call to see me sooner.

## 2023-03-03 LAB — INHIBIN B: Inhibin B: 96.4 pg/mL

## 2023-03-04 DIAGNOSIS — F321 Major depressive disorder, single episode, moderate: Secondary | ICD-10-CM | POA: Diagnosis not present

## 2023-03-10 LAB — ANTI MULLERIAN HORMONE: ANTI-MULLERIAN HORMONE (AMH): 1.02 ng/mL

## 2023-03-15 DIAGNOSIS — G901 Familial dysautonomia [Riley-Day]: Secondary | ICD-10-CM | POA: Insufficient documentation

## 2023-03-17 ENCOUNTER — Encounter: Payer: Self-pay | Admitting: Physician Assistant

## 2023-03-17 ENCOUNTER — Telehealth (INDEPENDENT_AMBULATORY_CARE_PROVIDER_SITE_OTHER): Payer: BC Managed Care – PPO | Admitting: Physician Assistant

## 2023-03-17 ENCOUNTER — Ambulatory Visit: Payer: BC Managed Care – PPO | Admitting: Internal Medicine

## 2023-03-17 DIAGNOSIS — Q796 Ehlers-Danlos syndrome, unspecified: Secondary | ICD-10-CM

## 2023-03-17 DIAGNOSIS — R269 Unspecified abnormalities of gait and mobility: Secondary | ICD-10-CM | POA: Diagnosis not present

## 2023-03-17 DIAGNOSIS — G901 Familial dysautonomia [Riley-Day]: Secondary | ICD-10-CM

## 2023-03-17 DIAGNOSIS — N926 Irregular menstruation, unspecified: Secondary | ICD-10-CM | POA: Diagnosis not present

## 2023-03-17 NOTE — Progress Notes (Signed)
Virtual Visit via Video Note   I, [name], connected with  Kathryn Park  ([id number], [birth year]) on 03/17/23 at 10:40 AM EDT by a video-enabled telemedicine application and verified that I am speaking with the correct person using two identifiers.  Location: Patient: Home Provider: [facility] [place] office   I discussed the limitations of evaluation and management by telemedicine and the availability of in person appointments. The patient expressed understanding and agreed to proceed.    History of Present Illness: Kathryn Park is a 37 y.o. who identifies as a female who was assigned female at birth, and is being seen today for.  Menstrual Cycle Disorder Had great follow up with Dr Carin Hock on 02/25/23 - got clean report Was also started on norethindrone 0.35 mg daily, started this about 5 days ago So far, doing well with this  Gait Abnormality; Dysautonomia; EDS She reports that she has had gait issues status post COVID infection She also had a significant flare of cold sores at this time Started seeing [facility] with physical therapy at this time Also saw Dr Marjory Lies with [facility] for muscle spasms, brain fog -- had essentially normal brain MR w and without contrast; had MR cervical spine w and without contrast which showed mild disc bulging and mild pinched nerves at C5-6.  Her most recent menses, had normal (for her) heavy bleeding, had some gait issues  Had 2-3 days of "drop foot" like symptom(s) and has this on video from her husband She would like evaluation of this    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 09/29/2022   Other adverse food reactions, not elsewhere classified, subsequent encounter 09/29/2022   Other allergic rhinitis 09/29/2022   Rash and other nonspecific skin eruption 09/29/2022    Pruritus 09/29/2022   Mild intermittent asthma without complication 09/29/2022   Gastroesophageal reflux disease 09/29/2022   EDS (Ehlers-Danlos syndrome) 06/10/2022   Tear of right acetabular labrum 07/22/2021   Breast lump 02/12/2021   Bilateral hip pain 11/25/2020   Positive ANA (antinuclear antibody) 11/25/2020   Other fatigue 11/25/2020   Bladder retention 10/13/2020   HSV-1 (herpes simplex virus 1) infection 10/13/2020   History of anorexia nervosa 10/13/2020   GAD (generalized anxiety disorder) 10/13/2020   Family history of breast cancer    Family history of kidney cancer    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 Rinse mouth after each use., Disp: 1 each, Rfl: 5   busPIRone (BUSPAR) 5 MG tablet, Take 1 tablet (5 mg total) by mouth 3 (three) times daily as needed (anxiety)., Disp: 90 tablet, Rfl: 1   cyanocobalamin 1000 MCG tablet, Take 1,000 mcg by mouth daily., Disp: , Rfl:  EPINEPHrine 0.3 mg/0.3 mL IJ SOAJ injection, Inject 0.3 mg into the muscle as needed for anaphylaxis., Disp: 2 each, Rfl: 1   escitalopram (LEXAPRO) 20 MG tablet, Take 1/2 tablet (10mg ) tablet by mouth daily for 2 weeks out of cycle, Disp: 45 tablet, Rfl: 0   famotidine (PEPCID) 20 MG tablet, Take 1 tablet (20 mg total) by mouth 2 (two) times daily., Disp: 60 tablet, Rfl: 3   fexofenadine (ALLEGRA) 180 MG  tablet, Take 180 mg by mouth in the morning and at bedtime., Disp: , Rfl:    magnesium oxide (MAG-OX) 400 (240 Mg) MG tablet, Take 400 mg by mouth daily., Disp: , Rfl:    Melatonin 10 MG TABS, Take 1 tablet by mouth at bedtime., Disp: , Rfl:    Multiple Vitamins-Minerals (MULTIPLE VITAMINS/WOMENS PO), Take 1 tablet by mouth daily in the afternoon., Disp: , Rfl:    norethindrone (ORTHO MICRONOR) 0.35 MG tablet, Take 1 tablet (0.35 mg total) by mouth daily., Disp: 28 tablet, Rfl: 11   valACYclovir (VALTREX) 1000 MG tablet, Take 2 tablets (2000 mg) by mouth twice a day for 24 hours as needed for cold sore., Disp: 30 tablet, Rfl: 1  Observations/Objective: Patient is well-developed, well-nourished in no acute distress.  Resting comfortably  at home.  Head is normocephalic, atraumatic.  No labored breathing.  Speech is clear and coherent with logical content.  Patient is alert and oriented at baseline.   Assessment and Plan: Menstrual cycle disorder Reviewed most recent note from Dr Pricilla Holm Continue management per specialist  Gait abnormality; EDS (Ehlers-Danlos syndrome); Dysautonomia [facility]) - Ambulatory referral to Neurology  Referring to Dr Coralee Pesa at [facility] for further evaluation and second opinion. Continue to monitor/log symptoms   Follow Up Instructions: I discussed the assessment and treatment plan with the patient. The patient was provided an opportunity to ask questions and all were answered. The patient agreed with the plan and demonstrated an understanding of the instructions.  A copy of instructions were sent to the patient via MyChart unless otherwise noted below.   The patient was advised to call back or seek an in-person evaluation if the symptoms worsen or if the condition fails to improve as anticipated.  [name], [place]

## 2023-03-17 NOTE — Progress Notes (Deleted)
Electrophysiology TeleHealth Note      Date:  03/17/2023   ID:  Kathryn Park, DOB 11-30-1985, MRN [id number]  Location: patient's home  Provider location: [address] [place]  Evaluation Performed: Follow-up visit  PCP:  [name], PA  Cardiologist:   *** Electrophysiologist:  SK   Chief Complaint:  ***  History of Present Illness:    Kathryn Park is a 37 y.o. female who presents via audio/video conferencing for a telehealth visit today.  Since last being seen in our clinic for longstanding history of orthostatic intolerance joint aches joint subluxations recently diagnosed with Ehlers-Danlos type III/ joint hypermobility syndrome, empiric therapy for MAST cell activation disorder, vigorous salt and water repletion with which she had reported doing pbetter    the patient reports ***    The patient denies symptoms of fevers, chills, cough, or new SOB worrisome for COVID 19. ***  Past Medical History:  Diagnosis Date   Abnormal Pap smear of cervix    Allergy    Anemia    suspected poor diet; vegetarian during teens   Anxiety    Asthma    childhood   Depression    Dysmenorrhea    Family history of breast cancer    Family history of kidney cancer    Granulosa cell tumor of ovary    History of iron deficiency anemia    age 3   Joint pain    Labral tear of right hip joint    Leukocytosis    Low vitamin D level    Migraine with aura    never on rx or saw neurology   Ovarian cancer (HCC) 2021   Ovarian cyst    Left   Pneumonia 01/2012    left lower lobe   Pre-diabetes 2015   had a "borderline" A1c   STD (sexually transmitted disease)    HSV1   Wears contact lenses    Wears glasses     Past Surgical History:  Procedure Laterality Date   ADENOIDECTOMY     BREAST BIOPSY Right 04/2020   benign   COLPOSCOPY     LAPAROSCOPIC OVARIAN CYSTECTOMY Left 11/13/2019   Procedure: LAPAROSCOPIC OVARIAN  CYSTECTOMY/POSSIBLE LEFT OOPHORECTOMY/ COLLECTION OF PELVIC WASHINGS/ks;  Surgeon: Patton Salles, MD;  Location: [facility];  Service: Gynecology;  Laterality: Left;  Possible left oophorectomy Colection of pelvic washings To follow first case of the day   LEFT OOPHORECTOMY Left 11/2019   right hip arthroscopy  01/2022   for laberal tear repair   TONSILLECTOMY     UPPER GI ENDOSCOPY      Current Outpatient Medications  Medication Sig Dispense Refill   albuterol (VENTOLIN HFA) 108 (90 Base) MCG/ACT inhaler Inhale 2 puffs into the lungs every 4 (four) hours as needed for wheezing or shortness of breath (coughing fits). 18 g 1   beclomethasone (QVAR) 80 MCG/ACT inhaler Inhale 2 puffs into the lungs 2 (two) times daily. Rinse mouth after each use. 1 each 5   busPIRone (BUSPAR) 5 MG tablet Take 1 tablet (5 mg total) by mouth 3 (three) times daily as needed (anxiety). 90 tablet 1   cyanocobalamin 1000 MCG tablet Take 1,000 mcg by mouth daily.     EPINEPHrine 0.3 mg/0.3 mL IJ SOAJ injection Inject 0.3 mg into the muscle as needed for anaphylaxis. 2 each 1   escitalopram (LEXAPRO) 20 MG tablet Take 1/2 tablet (10mg ) tablet  by mouth daily for 2 weeks out of cycle 45 tablet 0   famotidine (PEPCID) 20 MG tablet Take 1 tablet (20 mg total) by mouth 2 (two) times daily. 60 tablet 3   fexofenadine (ALLEGRA) 180 MG tablet Take 180 mg by mouth in the morning and at bedtime.     magnesium oxide (MAG-OX) 400 (240 Mg) MG tablet Take 400 mg by mouth daily.     Melatonin 10 MG TABS Take 1 tablet by mouth at bedtime.     Multiple Vitamins-Minerals (MULTIPLE VITAMINS/WOMENS PO) Take 1 tablet by mouth daily in the afternoon.     norethindrone (ORTHO MICRONOR) 0.35 MG tablet Take 1 tablet (0.35 mg total) by mouth daily. 28 tablet 11   valACYclovir (VALTREX) 1000 MG tablet Take 2 tablets (2000 mg) by mouth twice a day for 24 hours as needed for cold sore. 30 tablet 1   No current  facility-administered medications for this visit.    Allergies:   Shellfish allergy, Other, Latex, and Sulfa antibiotics   Social History:  The patient  reports that she has quit smoking. Her smoking use included cigarettes. She smoked an average of 1 pack per day. She has been exposed to tobacco smoke. She has never used smokeless tobacco. She reports that she does not currently use alcohol. She reports that she does not use drugs.   Family History:  The patient's   family history includes Allergic rhinitis in her brother, father, and mother; Asthma in her mother; Breast cancer in her maternal grandmother; Cancer in an other family member; Colon polyps in her mother; Diabetes in her mother; Diverticulitis in her mother; Eczema in her brother and father; Factor V Leiden deficiency in her mother; Hypertension in her mother; Kidney cancer in her father; Other in her brother and mother; Rheum arthritis in her maternal great-grandmother; Stroke in her maternal grandfather and mother; Ulcerative colitis in her father.   ROS:  Please see the history of present illness.   All other systems are personally reviewed and negative.    Exam:    Vital Signs:  LMP 02/08/2023 (Exact Date)  ***  Well appearing, alert and conversant, regular work of breathing,  good skin color Eyes- anicteric, neuro- grossly intact, skin- no apparent rash or lesions or cyanosis, mouth- oral mucosa is pink   Labs/Other Tests and Data Reviewed:    Recent Labs: 09/24/2022: TSH 1.490 09/29/2022: ALT 13; BUN 13; Creatinine, Ser 0.66; Hemoglobin 13.9; Platelets 256; Potassium 4.2; Sodium 139   Wt Readings from Last 3 Encounters:  02/25/23 142 lb 3.2 oz (64.5 kg)  02/21/23 138 lb (62.6 kg)  02/10/23 142 lb (64.4 kg)     Other studies personally reviewed: Additional studies/ records that were reviewed today include: ***  Review of the above records today demonstrates: *** Prior radiographs: ***    ASSESSMENT & PLAN:     Dysautonomia   Gait disturbance disorder   Ovarian cancer history-granulosa cell tumor   Possible and mast cell activation syndrome   Ehlers-Danlos syndrome with history of multiple joint subluxation   Arachnodactyly  ***    Follow-up:  ***    Current medicines are reviewed at length with the patient today.   The patient {ACTIONS; HAS/DOES NOT HAVE:19233} concerns regarding her medicines.  The following changes were made today:  {NONE DEFAULTED:18576}  Labs/ tests ordered today include: *** No orders of the defined types were placed in this encounter.     Today, I have spent ***  minutes with the patient with telehealth technology discussing the above.  Signed, Sherryl Manges, MD  03/17/2023 7:45 AM     [facility] [address] [place] [address] [phone] (office) [phone] (fax)

## 2023-03-18 DIAGNOSIS — F321 Major depressive disorder, single episode, moderate: Secondary | ICD-10-CM | POA: Diagnosis not present

## 2023-03-25 DIAGNOSIS — F321 Major depressive disorder, single episode, moderate: Secondary | ICD-10-CM | POA: Diagnosis not present

## 2023-04-01 NOTE — Progress Notes (Deleted)
37 y.o. G36P0000 Married Caucasian female here for annual exam.    PCP:     No LMP recorded.           Sexually active: {yes no:314532}  The current method of family planning is vasectomy.    Exercising: {yes no:314532}  {types:19826} Smoker:  former  Health Maintenance: Pap:  08/31/22 neg: HR HPV neg, 03/28/18 neg; HR HPV neg History of abnormal Pap:  yes, hx of clopo 2007, no treatment MMG:  02/22/23 Breast Density Cat C, BI-RADS CAT 1 neg Colonoscopy:  02/26/22 BMD:   n/a  Result  n/a TDaP:  10/05/15 Gardasil:   yes HIV: 10/13/20 NR Hep C: 10/13/20 neg Screening Labs:  Hb today: ***, Urine today: ***   reports that she has quit smoking. Her smoking use included cigarettes. She smoked an average of 1 pack per day. She has been exposed to tobacco smoke. She has never used smokeless tobacco. She reports that she does not currently use alcohol. She reports that she does not use drugs.  Past Medical History:  Diagnosis Date   Abnormal Pap smear of cervix    Allergy    Anemia    suspected poor diet; vegetarian during teens   Anxiety    Asthma    childhood   Depression    Dysmenorrhea    Family history of breast cancer    Family history of kidney cancer    Granulosa cell tumor of ovary    History of iron deficiency anemia    age 37   Joint pain    Labral tear of right hip joint    Leukocytosis    Low vitamin D level    Migraine with aura    never on rx or saw neurology   Ovarian cancer (HCC) 2021   Ovarian cyst    Left   Pneumonia 01/2012    left lower lobe   Pre-diabetes 2015   had a "borderline" A1c   STD (sexually transmitted disease)    HSV1   Wears contact lenses    Wears glasses     Past Surgical History:  Procedure Laterality Date   ADENOIDECTOMY     BREAST BIOPSY Right 04/2020   benign   COLPOSCOPY     LAPAROSCOPIC OVARIAN CYSTECTOMY Left 11/13/2019   Procedure: LAPAROSCOPIC OVARIAN CYSTECTOMY/POSSIBLE LEFT OOPHORECTOMY/ COLLECTION OF PELVIC  WASHINGS/ks;  Surgeon: Patton Salles, MD;  Location: [facility];  Service: Gynecology;  Laterality: Left;  Possible left oophorectomy Colection of pelvic washings To follow first case of the day   LEFT OOPHORECTOMY Left 11/2019   right hip arthroscopy  01/2022   for laberal tear repair   TONSILLECTOMY     UPPER GI ENDOSCOPY      Current Outpatient Medications  Medication Sig Dispense Refill   albuterol (VENTOLIN HFA) 108 (90 Base) MCG/ACT inhaler Inhale 2 puffs into the lungs every 4 (four) hours as needed for wheezing or shortness of breath (coughing fits). 18 g 1   beclomethasone (QVAR) 80 MCG/ACT inhaler Inhale 2 puffs into the lungs 2 (two) times daily. Rinse mouth after each use. 1 each 5   busPIRone (BUSPAR) 5 MG tablet Take 1 tablet (5 mg total) by mouth 3 (three) times daily as needed (anxiety). 90 tablet 1   cyanocobalamin 1000 MCG tablet Take 1,000 mcg by mouth daily.     EPINEPHrine 0.3 mg/0.3 mL IJ SOAJ injection Inject 0.3 mg into the muscle as needed for  anaphylaxis. 2 each 1   escitalopram (LEXAPRO) 20 MG tablet Take 1/2 tablet (10mg ) tablet by mouth daily for 2 weeks out of cycle 45 tablet 0   famotidine (PEPCID) 20 MG tablet Take 1 tablet (20 mg total) by mouth 2 (two) times daily. 60 tablet 3   fexofenadine (ALLEGRA) 180 MG tablet Take 180 mg by mouth in the morning and at bedtime.     magnesium oxide (MAG-OX) 400 (240 Mg) MG tablet Take 400 mg by mouth daily.     Melatonin 10 MG TABS Take 1 tablet by mouth at bedtime.     Multiple Vitamins-Minerals (MULTIPLE VITAMINS/WOMENS PO) Take 1 tablet by mouth daily in the afternoon.     norethindrone (ORTHO MICRONOR) 0.35 MG tablet Take 1 tablet (0.35 mg total) by mouth daily. 28 tablet 11   valACYclovir (VALTREX) 1000 MG tablet Take 2 tablets (2000 mg) by mouth twice a day for 24 hours as needed for cold sore. 30 tablet 1   No current facility-administered medications for this visit.    Family  History  Problem Relation Age of Onset   Asthma Mother    Allergic rhinitis Mother    Diabetes Mother    Hypertension Mother    Stroke Mother        TIA   Factor V Leiden deficiency Mother    Other Mother        pituitary tumor   Colon polyps Mother    Diverticulitis Mother    Allergic rhinitis Father    Eczema Father    Kidney cancer Father        cancerous tumor of kidney   Ulcerative colitis Father    Allergic rhinitis Brother    Eczema Brother    Other Brother        ear tumor, dx. age 27-6   Breast cancer Maternal Grandmother        dx. 80s   Stroke Maternal Grandfather    Rheum arthritis Maternal Great-grandmother    Cancer Other    Ovarian cancer Neg Hx    Endometrial cancer Neg Hx    Colon cancer Neg Hx     Review of Systems  Exam:   There were no vitals taken for this visit.    General appearance: alert, cooperative and appears stated age Head: normocephalic, without obvious abnormality, atraumatic Neck: no adenopathy, supple, symmetrical, trachea midline and thyroid normal to inspection and palpation Lungs: clear to auscultation bilaterally Breasts: normal appearance, no masses or tenderness, No nipple retraction or dimpling, No nipple discharge or bleeding, No axillary adenopathy Heart: regular rate and rhythm Abdomen: soft, non-tender; no masses, no organomegaly Extremities: extremities normal, atraumatic, no cyanosis or edema Skin: skin color, texture, turgor normal. No rashes or lesions Lymph nodes: cervical, supraclavicular, and axillary nodes normal. Neurologic: grossly normal  Pelvic: External genitalia:  no lesions              No abnormal inguinal nodes palpated.              Urethra:  normal appearing urethra with no masses, tenderness or lesions              Bartholins and Skenes: normal                 Vagina: normal appearing vagina with normal color and discharge, no lesions              Cervix: no lesions  Pap taken: {yes  no:314532} Bimanual Exam:  Uterus:  normal size, contour, position, consistency, mobility, non-tender              Adnexa: no mass, fullness, tenderness              Rectal exam: {yes no:314532}.  Confirms.              Anus:  normal sphincter tone, no lesions  Chaperone was present for exam:  ***  Assessment:   Well woman visit with gynecologic exam.   Plan: Mammogram screening discussed. Self breast awareness reviewed. Pap and HR HPV as above. Guidelines for Calcium, Vitamin D, regular exercise program including cardiovascular and weight bearing exercise.   Follow up annually and prn.   Additional counseling given.  {yes [id number]. _______ minutes face to face time of which over 50% was spent in counseling.    After visit summary provided.    '

## 2023-04-03 NOTE — Progress Notes (Deleted)
Follow Up Note  RE: Kathryn Park MRN: [id number] DOB: 1986-10-04 Date of Office Visit: 04/04/2023  Referring provider: [name], PA Primary care provider: [name], PA  Chief Complaint: No chief complaint on file.  History of Present Illness: I had the pleasure of seeing Kathryn Park for a follow up visit at the [facility] on 04/03/2023. She is a 37 y.o. female, who is being followed for r/o mast cell issues, allergic rhinitis, asthma, GERD, adverse food reaction, rash. Her previous allergy office visit was on 12/01/2022 with Dr. Selena Batten. Today is a regular follow up visit.  I received your results back from the sendout lab.  Good news is that it came back negative. You don't have hereditary alpha-tryptasemia.   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 Past history - Diagnosed with Kathryn Park and concerned about mast cell activation. Patient presents with a constellation of symptoms. No prior work up for this. Interim history - 2023 bloodwork (ANA, alpha gal, CBC diff, CU, C3, C4, CMP, crp, ESR) all unremarkable. Tryptase level 10.9. Consider referral to [facility] allergy - Dr. Andris Baumann I. Hoy Finlay, MD, PhD. Consider getting HAT test - it is $169. Patient interested in getting this done today.  Swab sample collected in the office and mailed out.  This will take about 6-8 weeks for results to get back.    Other allergic rhinitis Past history - Worsening rhino conjunctivitis symptoms. Took OTC antihistamines with some benefit. Flonase caused epistaxis. No prior AIT. 2023 skin testing showed: Positive to dust mites, perennial mold, mouse. Borderline to cat.  Interim history - dry nares. Continue environmental control measures as below. Use over the counter antihistamines such as Zyrtec (cetirizine), Allegra (fexofenadine), or Xyzal (levocetirizine) daily as needed. May take twice a day during allergy flares. May switch antihistamines  every few months. Use saline nasal spray.    Mild intermittent asthma without complication Past history - Diagnosed with asthma as a child. Not sure what type of inhalers she tried in the past. Covid-19 in 2023. 2023 spirometry showed: normal pattern with 35% improvement in FEV1 post bronchodilator treatment. Clinically feeling slightly improved.  Interim history - doing better with Flovent. Some issues with cost of ICS inhalers as she hasn't met deductible yet. Normal spirometry today - better than prior one.  Daily controller medication(s): continue Flovent 2 puffs twice a day with spacer and rinse mouth afterwards. Prior to physical activity: May use albuterol rescue inhaler 2 puffs 5 to 15 minutes prior to strenuous physical activities. Rescue medications: May use albuterol rescue inhaler 2 puffs  every 4 to 6 hours as needed for shortness of breath, chest tightness, coughing, and wheezing. Monitor frequency of use.    Gastroesophageal reflux disease Improved with famotidine.  Monitor symptoms. Restart famotidine 20mg  twice a day.    Other adverse food reactions, not elsewhere classified, subsequent encounter Past history - Throat closing after eating crab over 30 years go - unsure of specific details. Rash with watermelon. No recent work up. 2023 skin testing showed: Negative to select foods.  Interim history - 2023 bloodwork negative to shellfish and watermelon.  Continue to avoid shellfish and watermelon. If interested we can schedule food challenge to watermelon.  For mild symptoms you can take over the counter antihistamines such as Benadryl and monitor symptoms closely. If symptoms worsen or if you have severe symptoms including breathing issues, throat closure, significant swelling, whole body hives, severe diarrhea and vomiting, lightheadedness then  inject epinephrine and seek immediate medical care afterwards.   Rash and other nonspecific skin eruption Past history -  Pruritus and rash started many years ago. No specific triggers noted. Worse after showering.  Interim history - only has occasional episodes.  Continue proper skin care. Take a daily antihistamine.  Avoid the following potential triggers: alcohol, tight clothing, NSAIDs, hot showers and getting overheated.   Return in about 4 months (around 04/01/2023  Assessment and Plan: Kathryn Park is a 37 y.o. female with: No problem-specific Assessment & Plan notes found for this encounter.  No follow-ups on file.  No orders of the defined types were placed in this encounter.  Lab Orders  No laboratory test(s) ordered today    Diagnostics: Spirometry:  Tracings reviewed. Her effort: {Blank single:19197::"Good reproducible efforts.","It was hard to get consistent efforts and there is a question as to whether this reflects a maximal maneuver.","Poor effort, data can not be interpreted."} FVC: ***L FEV1: ***L, ***% predicted FEV1/FVC ratio: ***% Interpretation: {Blank single:19197::"Spirometry consistent with mild obstructive disease","Spirometry consistent with moderate obstructive disease","Spirometry consistent with severe obstructive disease","Spirometry consistent with possible restrictive disease","Spirometry consistent with mixed obstructive and restrictive disease","Spirometry uninterpretable due to technique","Spirometry consistent with normal pattern","No overt abnormalities noted given today's efforts"}.  Please see scanned spirometry results for details.  Skin Testing: {Blank single:19197::"Select foods","Environmental allergy panel","Environmental allergy panel and select foods","Food allergy panel","None","Deferred due to recent antihistamines use"}. *** Results discussed with patient/family.   Medication List:  Current Outpatient Medications  Medication Sig Dispense Refill   albuterol (VENTOLIN HFA) 108 (90 Base) MCG/ACT inhaler Inhale 2 puffs into the lungs every 4 (four) hours as  needed for wheezing or shortness of breath (coughing fits). 18 g 1   beclomethasone (QVAR) 80 MCG/ACT inhaler Inhale 2 puffs into the lungs 2 (two) times daily. Rinse mouth after each use. 1 each 5   busPIRone (BUSPAR) 5 MG tablet Take 1 tablet (5 mg total) by mouth 3 (three) times daily as needed (anxiety). 90 tablet 1   cyanocobalamin 1000 MCG tablet Take 1,000 mcg by mouth daily.     EPINEPHrine 0.3 mg/0.3 mL IJ SOAJ injection Inject 0.3 mg into the muscle as needed for anaphylaxis. 2 each 1   escitalopram (LEXAPRO) 20 MG tablet Take 1/2 tablet (10mg ) tablet by mouth daily for 2 weeks out of cycle 45 tablet 0   famotidine (PEPCID) 20 MG tablet Take 1 tablet (20 mg total) by mouth 2 (two) times daily. 60 tablet 3   fexofenadine (ALLEGRA) 180 MG tablet Take 180 mg by mouth in the morning and at bedtime.     magnesium oxide (MAG-OX) 400 (240 Mg) MG tablet Take 400 mg by mouth daily.     Melatonin 10 MG TABS Take 1 tablet by mouth at bedtime.     Multiple Vitamins-Minerals (MULTIPLE VITAMINS/WOMENS PO) Take 1 tablet by mouth daily in the afternoon.     norethindrone (ORTHO MICRONOR) 0.35 MG tablet Take 1 tablet (0.35 mg total) by mouth daily. 28 tablet 11   valACYclovir (VALTREX) 1000 MG tablet Take 2 tablets (2000 mg) by mouth twice a day for 24 hours as needed for cold sore. 30 tablet 1   No current facility-administered medications for this visit.   Allergies: Allergies  Allergen Reactions   Shellfish Allergy Anaphylaxis    Throat closes   Other Other (See Comments)    Watermelon, mold/mildew, pet dander, dust and dust mites  Swelling of eyes and congestion   Latex Rash  Sulfa Antibiotics Rash   I reviewed her past medical history, social history, family history, and environmental history and no significant changes have been reported from her previous visit.  Review of Systems  Constitutional:  Negative for appetite change, chills, fever and unexpected weight change.  HENT:   Negative for congestion and rhinorrhea.   Eyes:  Negative for itching.  Respiratory:  Negative for cough, chest tightness, shortness of breath and wheezing.   Cardiovascular:  Negative for chest pain.  Gastrointestinal:  Negative for abdominal pain and vomiting.  Skin:  Positive for rash.       pruritus  Allergic/Immunologic: Positive for environmental allergies.    Objective: There were no vitals taken for this visit. There is no height or weight on file to calculate BMI. Physical Exam Vitals and nursing note reviewed.  Constitutional:      Appearance: Normal appearance. She is well-developed.  HENT:     Head: Normocephalic and atraumatic.     Right Ear: Tympanic membrane and external ear normal.     Left Ear: Tympanic membrane and external ear normal.     Nose: Nose normal.     Mouth/Throat:     Mouth: Mucous membranes are moist.     Pharynx: Oropharynx is clear.  Eyes:     Conjunctiva/sclera: Conjunctivae normal.  Cardiovascular:     Rate and Rhythm: Normal rate and regular rhythm.     Heart sounds: Normal heart sounds. No murmur heard.    No friction rub. No gallop.  Pulmonary:     Effort: Pulmonary effort is normal.     Breath sounds: Normal breath sounds. No wheezing, rhonchi or rales.  Musculoskeletal:     Cervical back: Neck supple.  Skin:    General: Skin is warm.     Findings: No rash.  Neurological:     Mental Status: She is alert and oriented to person, place, and time.  Psychiatric:        Behavior: Behavior normal.    Previous notes and tests were reviewed. The plan was reviewed with the patient/family, and all questions/concerned were addressed.  It was my pleasure to see Kathryn Park today and participate in her care. Please feel free to contact me with any questions or concerns.  Sincerely,  Wyline Mood, DO Allergy & Immunology  [facility] office: [phone] [facility] office: [phone]

## 2023-04-04 ENCOUNTER — Ambulatory Visit: Payer: BC Managed Care – PPO | Admitting: Allergy

## 2023-04-04 DIAGNOSIS — J3089 Other allergic rhinitis: Secondary | ICD-10-CM

## 2023-04-04 DIAGNOSIS — R21 Rash and other nonspecific skin eruption: Secondary | ICD-10-CM

## 2023-04-04 DIAGNOSIS — T781XXD Other adverse food reactions, not elsewhere classified, subsequent encounter: Secondary | ICD-10-CM

## 2023-04-04 DIAGNOSIS — D8940 Mast cell activation, unspecified: Secondary | ICD-10-CM

## 2023-04-04 DIAGNOSIS — K219 Gastro-esophageal reflux disease without esophagitis: Secondary | ICD-10-CM

## 2023-04-04 DIAGNOSIS — J4520 Mild intermittent asthma, uncomplicated: Secondary | ICD-10-CM

## 2023-04-13 ENCOUNTER — Ambulatory Visit: Payer: BC Managed Care – PPO | Admitting: Obstetrics and Gynecology

## 2023-04-15 DIAGNOSIS — F321 Major depressive disorder, single episode, moderate: Secondary | ICD-10-CM | POA: Diagnosis not present

## 2023-04-27 NOTE — Progress Notes (Signed)
37 y.o. G26P0000 Married Caucasian female here for annual exam.   Husband is present for visit today.  PMDD controlled with Lexapro, taking 10 mg for 2 weeks per month.  Now she is not feeling the waves of the PMDD. No suicidal ideation.   Taking Micronor to reduce bleeding with her menstrual cycle.  Less bleeding now.  First cycle had early cycle.  Finishing her second month.   Dx with Ehrler Danlos syndrome. She is asking about a connective tissue panel through blood work.   Having gait issues and is seeing neurology. Saw cardiology and has dx of dysautonomia.   Does not have dx of POTTS.  Now on Micronor pills through Dr. Pricilla Holm.  Seeing her every 6 months until hysterectomy.  Having blood work done checking tumor markers.  Doing pelvic US yearly.   Some pelvic pain with cycles.  Cramping after orgasm.   No dysuria or blood in urine.  Some diarrhea and constipation.   No blood in stool.  May consider seeing a specialist in mast cell activation.   PCP:   [name], PA  Patient's last menstrual period was 05/04/2023.     Period Cycle (Days): 28 Period Duration (Days): 4-5 Period Pattern: Regular Menstrual Flow: Light Menstrual Control: Other (Comment) Dysmenorrhea: (!) Moderate     Sexually active: Yes.    The current method of family planning is vasectomy/POP.   Exercising: Yes.     Smoker:  former  Health Maintenance: Pap:  08/31/22 neg, neg HR HPV, 03-28-18 Neg:Neg HR HPV, 2015 ?Neg  History of abnormal Pap:  yes, Hx of colposcopy 2007. No treatment  MMG:  02/22/23 Breast Density Cat C, BI-RADS CAT 1 neg.   Colonoscopy:  02/26/22 - polyps, due in 2026.  Followed by [name]. BMD:   n/a  Result  n/a TDaP:  PCP Gardasil:   yes HIV: 10/13/20 NR Hep C: 10/13/20  Screening Labs:  PCP   reports that she has quit smoking. Her smoking use included cigarettes. She has been exposed to tobacco smoke. She has never used smokeless tobacco. She reports that she does  not currently use alcohol. She reports that she does not use drugs.  Past Medical History:  Diagnosis Date   Abnormal Pap smear of cervix    Allergy    Anemia    suspected poor diet; vegetarian during teens   Anxiety    Asthma    childhood   Depression    Dysmenorrhea    Family history of breast cancer    Family history of kidney cancer    Granulosa cell tumor of ovary    History of iron deficiency anemia    age 44   Joint pain    Labral tear of right hip joint    Leukocytosis    Low vitamin D level    Migraine with aura    never on rx or saw neurology   Ovarian cancer (HCC) 2021   Ovarian cyst    Left   Pneumonia 01/2012    left lower lobe   Pre-diabetes 2015   had a "borderline" A1c   STD (sexually transmitted disease)    HSV1   Wears contact lenses    Wears glasses     Past Surgical History:  Procedure Laterality Date   ADENOIDECTOMY     BREAST BIOPSY Right 04/2020   benign   COLPOSCOPY     LAPAROSCOPIC OVARIAN CYSTECTOMY Left 11/13/2019   Procedure: LAPAROSCOPIC OVARIAN CYSTECTOMY/POSSIBLE LEFT  OOPHORECTOMY/ COLLECTION OF PELVIC WASHINGS/ks;  Surgeon: Patton Salles, MD;  Location: [facility];  Service: Gynecology;  Laterality: Left;  Possible left oophorectomy Colection of pelvic washings To follow first case of the day   LEFT OOPHORECTOMY Left 11/2019   right hip arthroscopy  01/2022   for laberal tear repair   TONSILLECTOMY     UPPER GI ENDOSCOPY      Current Outpatient Medications  Medication Sig Dispense Refill   albuterol (VENTOLIN HFA) 108 (90 Base) MCG/ACT inhaler Inhale 2 puffs into the lungs every 4 (four) hours as needed for wheezing or shortness of breath (coughing fits). 18 g 1   beclomethasone (QVAR) 80 MCG/ACT inhaler Inhale 2 puffs into the lungs 2 (two) times daily. Rinse mouth after each use. 1 each 5   busPIRone (BUSPAR) 5 MG tablet Take 1 tablet (5 mg total) by mouth 3 (three) times daily as needed (anxiety).  90 tablet 1   cyanocobalamin 1000 MCG tablet Take 1,000 mcg by mouth daily.     EPINEPHrine 0.3 mg/0.3 mL IJ SOAJ injection Inject 0.3 mg into the muscle as needed for anaphylaxis. 2 each 1   famotidine (PEPCID) 20 MG tablet Take 1 tablet (20 mg total) by mouth 2 (two) times daily. 60 tablet 3   fexofenadine (ALLEGRA) 180 MG tablet Take 180 mg by mouth in the morning and at bedtime.     magnesium oxide (MAG-OX) 400 (240 Mg) MG tablet Take 400 mg by mouth daily.     Melatonin 10 MG TABS Take 1 tablet by mouth at bedtime.     Multiple Vitamins-Minerals (MULTIPLE VITAMINS/WOMENS PO) Take 1 tablet by mouth daily in the afternoon.     norethindrone (ORTHO MICRONOR) 0.35 MG tablet Take 1 tablet (0.35 mg total) by mouth daily. 28 tablet 11   escitalopram (LEXAPRO) 20 MG tablet Take 1/2 tablet (10mg ) tablet by mouth daily for 2 weeks out of cycle 45 tablet 3   valACYclovir (VALTREX) 1000 MG tablet Take 2 tablets (2000 mg) by mouth twice a day for 24 hours as needed for cold sore. 30 tablet 1   No current facility-administered medications for this visit.    Family History  Problem Relation Age of Onset   Asthma Mother    Allergic rhinitis Mother    Diabetes Mother    Hypertension Mother    Stroke Mother        TIA   Factor V Leiden deficiency Mother    Other Mother        pituitary tumor   Colon polyps Mother    Diverticulitis Mother    Allergic rhinitis Father    Eczema Father    Kidney cancer Father        cancerous tumor of kidney   Ulcerative colitis Father    Allergic rhinitis Brother    Eczema Brother    Other Brother        ear tumor, dx. age 1-6   Breast cancer Maternal Grandmother        dx. 80s   Stroke Maternal Grandfather    Rheum arthritis Maternal Great-grandmother    Cancer Other    Ovarian cancer Neg Hx    Endometrial cancer Neg Hx    Colon cancer Neg Hx     Review of Systems  All other systems reviewed and are negative.   Exam:   BP 128/72 (BP Location:  Right Arm, Patient Position: Sitting, Cuff Size: Normal)  Pulse 63   Ht 5' 7.5" (1.715 m)   Wt 141 lb (64 kg)   LMP 05/04/2023   SpO2 98%   BMI 21.76 kg/m     General appearance: alert, cooperative and appears stated age Head: normocephalic, without obvious abnormality, atraumatic Neck: no adenopathy, supple, symmetrical, trachea midline and thyroid normal to inspection and palpation Lungs: clear to auscultation bilaterally Breasts: normal appearance, no masses or tenderness, No nipple retraction or dimpling, No nipple discharge or bleeding, No axillary adenopathy Heart: regular rate and rhythm Abdomen: soft, non-tender; no masses, no organomegaly Extremities: extremities normal, atraumatic, no cyanosis or edema Skin: skin color, texture, turgor normal. No rashes or lesions Lymph nodes: cervical, supraclavicular, and axillary nodes normal. Neurologic: grossly normal  Pelvic: External genitalia:  no lesions              No abnormal inguinal nodes palpated.              Urethra:  normal appearing urethra with no masses, tenderness or lesions              Bartholins and Skenes: normal                 Vagina: normal appearing vagina with normal color and discharge, no lesions              Cervix: no lesions.  Menstrual blood noted.               Pap taken: no Bimanual Exam:  Uterus:  normal size, contour, position, consistency, mobility, non-tender              Adnexa: no mass, fullness, tenderness              Rectal exam: yes.  Confirms.              Anus:  normal sphincter tone, no lesions  Chaperone was present for exam:  Warren Lacy, CMA  Assessment:   Well woman visit with gynecologic exam. Status post laparoscopic left oophorectomy.  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  11/2019. At increased risk for breast cancer.  Lifetime risk 19.0%. FH breast cancer. Migraine with aura. Anxiety.  PMDD.  Right breast 2.5 cm mass 6:00.  Lumpy change right breast upper, outer  quadrant.  Plan: Mammogram screening discussed.  Dx mammogram and right breast US. [facility]. She will do yearly mammogram. Self breast awareness reviewed. We discussed potential breast MRI which she will consider. Pap and HR HPV as above. Guidelines for Calcium, Vitamin D, regular exercise program including cardiovascular and weight bearing exercise.   Follow up annually and prn.   Additional counseling given.  {yes [id number]. _______ minutes face to face time of which over 50% was spent in counseling.    After visit summary provided.

## 2023-04-29 DIAGNOSIS — F321 Major depressive disorder, single episode, moderate: Secondary | ICD-10-CM | POA: Diagnosis not present

## 2023-05-04 ENCOUNTER — Ambulatory Visit (INDEPENDENT_AMBULATORY_CARE_PROVIDER_SITE_OTHER): Payer: BC Managed Care – PPO | Admitting: Obstetrics and Gynecology

## 2023-05-04 ENCOUNTER — Encounter: Payer: Self-pay | Admitting: Obstetrics and Gynecology

## 2023-05-04 VITALS — BP 128/72 | HR 63 | Ht 67.5 in | Wt 141.0 lb

## 2023-05-04 DIAGNOSIS — D3912 Neoplasm of uncertain behavior of left ovary: Secondary | ICD-10-CM

## 2023-05-04 DIAGNOSIS — Z01419 Encounter for gynecological examination (general) (routine) without abnormal findings: Secondary | ICD-10-CM

## 2023-05-04 DIAGNOSIS — N6315 Unspecified lump in the right breast, overlapping quadrants: Secondary | ICD-10-CM | POA: Diagnosis not present

## 2023-05-04 DIAGNOSIS — Z5181 Encounter for therapeutic drug level monitoring: Secondary | ICD-10-CM | POA: Diagnosis not present

## 2023-05-04 DIAGNOSIS — F3281 Premenstrual dysphoric disorder: Secondary | ICD-10-CM | POA: Diagnosis not present

## 2023-05-04 MED ORDER — VALACYCLOVIR HCL 1 G PO TABS: ORAL_TABLET | ORAL | 1 refills | Status: AC

## 2023-05-04 MED ORDER — ESCITALOPRAM OXALATE 20 MG PO TABS: ORAL_TABLET | ORAL | 3 refills | Status: DC

## 2023-05-04 NOTE — Patient Instructions (Signed)

## 2023-05-06 DIAGNOSIS — F321 Major depressive disorder, single episode, moderate: Secondary | ICD-10-CM | POA: Diagnosis not present

## 2023-05-12 ENCOUNTER — Telehealth (INDEPENDENT_AMBULATORY_CARE_PROVIDER_SITE_OTHER): Payer: BC Managed Care – PPO | Admitting: Physician Assistant

## 2023-05-12 ENCOUNTER — Encounter: Payer: Self-pay | Admitting: Physician Assistant

## 2023-05-12 VITALS — Ht 67.0 in

## 2023-05-12 DIAGNOSIS — G901 Familial dysautonomia [Riley-Day]: Secondary | ICD-10-CM

## 2023-05-12 DIAGNOSIS — R269 Unspecified abnormalities of gait and mobility: Secondary | ICD-10-CM | POA: Diagnosis not present

## 2023-05-12 DIAGNOSIS — R7303 Prediabetes: Secondary | ICD-10-CM | POA: Diagnosis not present

## 2023-05-12 NOTE — Progress Notes (Signed)
Virtual Visit via Video Note   I, [name], connected with  Kathryn Park  ([id number], [birth year]) on 05/12/23 at  9:00 AM EDT by a video-enabled telemedicine application and verified that I am speaking with the correct person using two identifiers.  Location: Patient: Home Provider: [facility] [place] office   I discussed the limitations of evaluation and management by telemedicine and the availability of in person appointments. The patient expressed understanding and agreed to proceed.    History of Present Illness: Kathryn Park is a 37 y.o. who identifies as a female who was assigned female at birth, and is being seen today for the following:  Gait issues Having ongoing gait issues Reports that she was recommended to see Dr Lurena Joiner Tat for these issues by her cardiologist We placed an order for her to see a neurologist with [facility] but she has not heard back from them Symptoms are getting progressively worse  Pre-diabetic Fasting blood sugar was around 105 around 2018 Currently blood sugar is around 110-120 She is drinking electrolyte supplements that are low-sugar Having late night sugar cravings Feels like she has gained weight over the past year Denies increase in urinary frequency or dry mouth  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 09/29/2022   Other adverse food reactions, not elsewhere classified, subsequent encounter 09/29/2022   Other allergic rhinitis 09/29/2022   Rash and other nonspecific skin eruption 09/29/2022   Pruritus 09/29/2022   Mild intermittent asthma without complication 09/29/2022   Gastroesophageal reflux disease 09/29/2022   EDS (Ehlers-Danlos syndrome) 06/10/2022   Tear of right acetabular labrum 07/22/2021   Breast lump 02/12/2021   Bilateral hip pain 11/25/2020   Positive ANA (antinuclear  antibody) 11/25/2020   Other fatigue 11/25/2020   Bladder retention 10/13/2020   HSV-1 (herpes simplex virus 1) infection 10/13/2020   History of anorexia nervosa 10/13/2020   GAD (generalized anxiety disorder) 10/13/2020   Family history of breast cancer    Family history of kidney cancer    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 Rinse mouth after each use., Disp: 1 each, Rfl: 5   busPIRone (BUSPAR) 5 MG tablet, Take 1 tablet (5 mg total) by mouth 3 (three) times daily as needed (anxiety)., Disp: 90 tablet, Rfl: 1   cyanocobalamin 1000 MCG tablet, Take 1,000 mcg by mouth daily., Disp: , Rfl:    EPINEPHrine 0.3 mg/0.3 mL IJ SOAJ injection, Inject 0.3 mg into the muscle as needed for anaphylaxis., Disp: 2 each, Rfl: 1   escitalopram (LEXAPRO) 20 MG tablet, Take 1/2 tablet (10mg ) tablet by mouth daily for 2 weeks out of cycle, Disp: 45 tablet, Rfl: 3   famotidine (PEPCID) 20 MG tablet, Take 1 tablet (20 mg  total) by mouth 2 (two) times daily., Disp: 60 tablet, Rfl: 3   fexofenadine (ALLEGRA) 180 MG tablet, Take 180 mg by mouth in the morning and at bedtime., Disp: , Rfl:    magnesium oxide (MAG-OX) 400 (240 Mg) MG tablet, Take 400 mg by mouth daily., Disp: , Rfl:    Melatonin 10 MG TABS, Take 1 tablet by mouth at bedtime., Disp: , Rfl:    Multiple Vitamins-Minerals (MULTIPLE VITAMINS/WOMENS PO),  Take 1 tablet by mouth daily in the afternoon., Disp: , Rfl:    norethindrone (ORTHO MICRONOR) 0.35 MG tablet, Take 1 tablet (0.35 mg total) by mouth daily., Disp: 28 tablet, Rfl: 11   valACYclovir (VALTREX) 1000 MG tablet, Take 2 tablets (2000 mg) by mouth twice a day for 24 hours as needed for cold sore., Disp: 30 tablet, Rfl: 1  Observations/Objective: Patient is well-developed, well-nourished in no acute distress.  Resting comfortably  at home.  Head is normocephalic, atraumatic.  No labored breathing.  Speech is clear and coherent with logical content.  Patient is alert and oriented at baseline.   Assessment and Plan: Gait abnormality; Dysautonomia (HCC) - Ambulatory referral to Neurology  Will place new referral per patient request   Prediabetes Update blood work at next visit She does not seem to be having any acute symptom(s) of diabetic emergency -- continue to monitor symptom(s)  If new/worsening symptom(s) in the meantime, she will let us know  Follow Up Instructions: I discussed the assessment and treatment plan with the patient. The patient was provided an opportunity to ask questions and all were answered. The patient agreed with the plan and demonstrated an understanding of the instructions.  A copy of instructions were sent to the patient via MyChart unless otherwise noted below.   The patient was advised to call back or seek an in-person evaluation if the symptoms worsen or if the condition fails to improve as anticipated.  [name], [place]

## 2023-05-16 ENCOUNTER — Encounter: Payer: Self-pay | Admitting: Physician Assistant

## 2023-05-18 NOTE — Progress Notes (Unsigned)
Electrophysiology TeleHealth Note      Date:  05/19/2023   ID:  Kathryn Park, DOB 1986/02/05, MRN [id number]  Location: patient's home  Provider location: [address] [place]  Evaluation Performed: Follow-up visit  PCP:  [name], PA  Cardiologist:     Electrophysiologist:  SK   Chief Complaint:  dysautonomia   History of Present Illness:    Kathryn Park is a 37 y.o. female who presents via audio conferencing for a telehealth visit today.  Since last being seen in our clinic for orthostatic intolerance with JHS/EDS3 ? Mast cell activation disorder and hx of ovarian Ca and gait disturbance with neuro consultation recommended   the patient reports  Morning a little better-- with salt repletion ( Normalyte)  Now on BCP Neuro eval pending  Jt pain-- Brain fog stable Fatigue better GI better Mast Cell eval is pending >> but still with histamine  Mental health  DATE TEST EF   4/24 Echo   60% Normal Mitral Valve  4/24 CTA  Ao  Normal Bovine Variant         Date Cr K Hgb   12/23 0.66 4.2 13.9          The patient denies symptoms of fevers, chills, cough, or new SOB worrisome for COVID 19.    Past Medical History:  Diagnosis Date   Abnormal Pap smear of cervix    Allergy    Anemia    suspected poor diet; vegetarian during teens   Anxiety    Asthma    childhood   Depression    Dysmenorrhea    Family history of breast cancer    Family history of kidney cancer    Granulosa cell tumor of ovary    History of iron deficiency anemia    age 51   Joint pain    Labral tear of right hip joint    Leukocytosis    Low vitamin D level    Migraine with aura    never on rx or saw neurology   Ovarian cancer (HCC) 2021   Ovarian cyst    Left   Pneumonia 01/2012    left lower lobe   Pre-diabetes 2015   had a "borderline" A1c   STD (sexually transmitted disease)    HSV1   Wears contact lenses    Wears  glasses     Past Surgical History:  Procedure Laterality Date   ADENOIDECTOMY     BREAST BIOPSY Right 04/2020   benign   COLPOSCOPY     LAPAROSCOPIC OVARIAN CYSTECTOMY Left 11/13/2019   Procedure: LAPAROSCOPIC OVARIAN CYSTECTOMY/POSSIBLE LEFT OOPHORECTOMY/ COLLECTION OF PELVIC WASHINGS/ks;  Surgeon: Patton Salles, MD;  Location: [facility];  Service: Gynecology;  Laterality: Left;  Possible left oophorectomy Colection of pelvic washings To follow first case of the day   LEFT OOPHORECTOMY Left 11/2019   right hip arthroscopy  01/2022   for laberal tear repair   TONSILLECTOMY     UPPER GI ENDOSCOPY      Current Outpatient Medications  Medication Sig Dispense Refill   albuterol (VENTOLIN HFA) 108 (90 Base) MCG/ACT inhaler Inhale 2 puffs into the lungs every 4 (four) hours as needed for wheezing or shortness of breath (coughing fits). 18 g 1   beclomethasone (QVAR) 80 MCG/ACT inhaler Inhale 2 puffs into the lungs 2 (two) times daily. Rinse mouth after each use. 1 each 5  busPIRone (BUSPAR) 5 MG tablet Take 1 tablet (5 mg total) by mouth 3 (three) times daily as needed (anxiety). 90 tablet 1   cyanocobalamin 1000 MCG tablet Take 1,000 mcg by mouth daily.     EPINEPHrine 0.3 mg/0.3 mL IJ SOAJ injection Inject 0.3 mg into the muscle as needed for anaphylaxis. 2 each 1   escitalopram (LEXAPRO) 20 MG tablet Take 1/2 tablet (10mg ) tablet by mouth daily for 2 weeks out of cycle 45 tablet 3   famotidine (PEPCID) 20 MG tablet Take 1 tablet (20 mg total) by mouth 2 (two) times daily. 60 tablet 3   fexofenadine (ALLEGRA) 180 MG tablet Take 180 mg by mouth in the morning and at bedtime.     magnesium oxide (MAG-OX) 400 (240 Mg) MG tablet Take 400 mg by mouth daily.     Melatonin 10 MG TABS Take 1 tablet by mouth at bedtime.     Multiple Vitamins-Minerals (MULTIPLE VITAMINS/WOMENS PO) Take 1 tablet by mouth daily in the afternoon.     norethindrone (ORTHO MICRONOR) 0.35  MG tablet Take 1 tablet (0.35 mg total) by mouth daily. 28 tablet 11   valACYclovir (VALTREX) 1000 MG tablet Take 2 tablets (2000 mg) by mouth twice a day for 24 hours as needed for cold sore. 30 tablet 1   No current facility-administered medications for this visit.    Allergies:   Shellfish allergy, Other, Latex, and Sulfa antibiotics   ROS:  Please see the history of present illness.   All other systems are personally reviewed and negative.    Exam:    Vital Signs:  BP (!) 133/90   Pulse 83   Ht 5\' 7"  (1.702 m)   LMP 05/04/2023   BMI 22.08 kg/m     Well appearing, alert and conversant, regular work of breathing,  good skin color Eyes- anicteric, neuro- grossly intact, skin- no apparent rash or lesions or cyanosis, mouth- oral mucosa is pink   Labs/Other Tests and Data Reviewed:    Recent Labs: 09/24/2022: TSH 1.490 09/29/2022: ALT 13; BUN 13; Creatinine, Ser 0.66; Hemoglobin 13.9; Platelets 256; Potassium 4.2; Sodium 139   Wt Readings from Last 3 Encounters:  05/04/23 141 lb (64 kg)  02/25/23 142 lb 3.2 oz (64.5 kg)  02/21/23 138 lb (62.6 kg)     Other studies personally reviewed: Additional studies/ records that were reviewed today include:      ASSESSMENT & PLAN:    Dysautonomia   Gait disturbance disorder   Ovarian cancer history-granulosa cell tumor   Possible and mast cell activation syndrome   Ehlers-Danlos syndrome with history of multiple joint subluxation   Arachnodactyly    Improved with salt and water repletion  [facility]  consider  Neuro referral  --- efforts to facilitate     Follow-up:  6 m      Current medicines are reviewed at length with the patient today.   The patient  concerns regarding her medicines.  The following changes were made today:  none  Labs/ tests ordered today include:  No orders of the defined types were placed in this encounter.     Today, I have spent 22 minutes with the patient with telehealth technology  discussing the above.  Signed, Sherryl Manges, MD  05/19/2023 9:59 AM     [facility] [address] [place] [address] [phone] (office) [phone] (fax)

## 2023-05-19 ENCOUNTER — Ambulatory Visit: Payer: BC Managed Care – PPO | Attending: Internal Medicine | Admitting: Internal Medicine

## 2023-05-19 ENCOUNTER — Encounter: Payer: Self-pay | Admitting: Internal Medicine

## 2023-05-19 ENCOUNTER — Telehealth: Payer: Self-pay

## 2023-05-19 DIAGNOSIS — R269 Unspecified abnormalities of gait and mobility: Secondary | ICD-10-CM

## 2023-05-19 NOTE — Patient Instructions (Signed)
Medication Instructions:  The current medical regimen is effective;  continue present plan and medications.  *If you need a refill on your cardiac medications before your next appointment, please call your pharmacy*   Follow-Up: At [facility], you and your health needs are our priority.  As part of our continuing mission to provide you with exceptional heart care, we have created designated Provider Care Teams.  These Care Teams include your primary Cardiologist (physician) and Advanced Practice Providers (APPs -  Physician Assistants and Nurse Practitioners) who all work together to provide you with the care you need, when you need it.  We recommend signing up for the patient portal called "MyChart".  Sign up information is provided on this After Visit Summary.  MyChart is used to connect with patients for Virtual Visits (Telemedicine).  Patients are able to view lab/test results, encounter notes, upcoming appointments, etc.  Non-urgent messages can be sent to your provider as well.   To learn more about what you can do with MyChart, go to [email or web address].    Your next appointment:   6 month(s)  Provider:   Sherryl Manges, MD

## 2023-05-19 NOTE — Telephone Encounter (Signed)
..   Pt understands that although there may be some limitations with this type of visit, we will take all precautions to reduce any security or privacy concerns.  Pt understands that this will be treated like an in office visit and we will file with pt's insurance, and there may be a patient responsible charge related to this service. ? ?

## 2023-05-20 DIAGNOSIS — F321 Major depressive disorder, single episode, moderate: Secondary | ICD-10-CM | POA: Diagnosis not present

## 2023-05-24 ENCOUNTER — Encounter: Payer: Self-pay | Admitting: Obstetrics and Gynecology

## 2023-05-24 ENCOUNTER — Encounter: Payer: Self-pay | Admitting: Physician Assistant

## 2023-05-24 DIAGNOSIS — N6315 Unspecified lump in the right breast, overlapping quadrants: Secondary | ICD-10-CM

## 2023-05-24 NOTE — Telephone Encounter (Signed)
Orders pend. Ok to schedule?

## 2023-05-24 NOTE — Telephone Encounter (Signed)
Spoke w/ Kathryn Park at [facility] and accepted the first available appt for 8/28 @ 1:30. Will notify pt via mychart msg.   Routing to provider for final review and closing encounter.

## 2023-05-25 ENCOUNTER — Encounter: Payer: Self-pay | Admitting: Internal Medicine

## 2023-05-25 NOTE — Telephone Encounter (Signed)
Samantha, please look at referral and see Dr. Don Perking response and advise.

## 2023-05-26 ENCOUNTER — Telehealth: Payer: Self-pay | Admitting: Physician Assistant

## 2023-05-26 NOTE — Telephone Encounter (Signed)
Error

## 2023-05-27 DIAGNOSIS — H5052 Exophoria: Secondary | ICD-10-CM | POA: Diagnosis not present

## 2023-05-31 ENCOUNTER — Telehealth (INDEPENDENT_AMBULATORY_CARE_PROVIDER_SITE_OTHER): Payer: BC Managed Care – PPO | Admitting: Physician Assistant

## 2023-05-31 ENCOUNTER — Encounter: Payer: Self-pay | Admitting: Physician Assistant

## 2023-05-31 VITALS — Ht 67.0 in | Wt 140.0 lb

## 2023-05-31 DIAGNOSIS — G901 Familial dysautonomia [Riley-Day]: Secondary | ICD-10-CM

## 2023-05-31 DIAGNOSIS — R269 Unspecified abnormalities of gait and mobility: Secondary | ICD-10-CM | POA: Diagnosis not present

## 2023-05-31 DIAGNOSIS — H532 Diplopia: Secondary | ICD-10-CM | POA: Diagnosis not present

## 2023-05-31 NOTE — Progress Notes (Signed)
Virtual Visit via Video Note   I, , connected with  Kathryn Park  ([id number], Dec 07, 1985) on 05/31/23 at  8:00 AM EDT by a video-enabled telemedicine application and verified that I am speaking with the correct person using two identifiers.  Location: Patient: Home Provider: [facility] office   I discussed the limitations of evaluation and management by telemedicine and the availability of in person appointments. The patient expressed understanding and agreed to proceed.    History of Present Illness: Kathryn Park is a 37 y.o. who identifies as a female who was assigned female at birth, and is being seen today for:  Diplopia Patient reports that "over the past 2-3 weeks, my eyes have started to lose focus and cross when I am trying to type, read, etc, and I am having to close one eye to read anything (with and without my glasses)." She ended up going to her eye doctor with these issues and they said that it was likely due to her needing new type of prescription lens. She overall feels reassured from this.  Dysautonomia; Gait Abnormality Has scheduled a follow-up appointment with [facility] for December (soonest she could get in.) Does feel like her dysautonomia is getting better, continues to ingest additional sodium, take in lots of fluids, wears compression stockings. She is concerned about ongoing gait disturbance and trying to find someone who will advocate for her and listen.  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 09/29/2022   Other adverse food reactions, not elsewhere classified, subsequent encounter 09/29/2022   Other allergic rhinitis 09/29/2022   Rash and other nonspecific skin eruption 09/29/2022   Pruritus 09/29/2022   Mild intermittent asthma without complication 09/29/2022   Gastroesophageal reflux disease  09/29/2022   EDS (Ehlers-Danlos syndrome) 06/10/2022   Tear of right acetabular labrum 07/22/2021   Breast lump 02/12/2021   Bilateral hip pain 11/25/2020   Positive ANA (antinuclear antibody) 11/25/2020   Other fatigue 11/25/2020   Bladder retention 10/13/2020   HSV-1 (herpes simplex virus 1) infection 10/13/2020   History of anorexia nervosa 10/13/2020   GAD (generalized anxiety disorder) 10/13/2020   Family history of breast cancer    Family history of kidney cancer    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 Rinse mouth after each use., Disp: 1 each, Rfl: 5   busPIRone (BUSPAR) 5 MG tablet, Take 1 tablet (5 mg total) by mouth 3 (three) times daily as needed (anxiety)., Disp: 90 tablet, Rfl: 1   cyanocobalamin 1000 MCG tablet, Take 1,000 mcg by mouth daily., Disp: , Rfl:    EPINEPHrine 0.3 mg/0.3 mL IJ SOAJ injection, Inject 0.3 mg into the muscle as needed for anaphylaxis., Disp: 2 each, Rfl: 1  escitalopram (LEXAPRO) 20 MG tablet, Take 1/2 tablet (10mg ) tablet by mouth daily for 2 weeks out of cycle, Disp: 45 tablet, Rfl: 3   famotidine (PEPCID) 20 MG tablet, Take 1 tablet (20 mg total) by mouth 2 (two) times daily., Disp: 60 tablet, Rfl: 3   fexofenadine (ALLEGRA) 180 MG tablet, Take 180 mg by mouth in the morning and at bedtime., Disp: , Rfl:    magnesium oxide (MAG-OX) 400 (240 Mg) MG  tablet, Take 400 mg by mouth daily., Disp: , Rfl:    Melatonin 10 MG TABS, Take 1 tablet by mouth at bedtime., Disp: , Rfl:    Multiple Vitamins-Minerals (MULTIPLE VITAMINS/WOMENS PO), Take 1 tablet by mouth daily in the afternoon., Disp: , Rfl:    norethindrone (ORTHO MICRONOR) 0.35 MG tablet, Take 1 tablet (0.35 mg total) by mouth daily., Disp: 28 tablet, Rfl: 11   Turmeric 500 MG CAPS, Take 1 capsule by mouth daily in the afternoon., Disp: , Rfl:    valACYclovir (VALTREX) 1000 MG tablet, Take 2 tablets (2000 mg) by mouth twice a day for 24 hours as needed for cold sore., Disp: 30 tablet, Rfl: 1  Observations/Objective: Patient is well-developed, well-nourished in no acute distress.  Resting comfortably  at home.  Head is normocephalic, atraumatic.  No labored breathing.  Speech is clear and coherent with logical content.  Patient is alert and oriented at baseline.   Assessment and Plan: Diplopia Reviewed No red flags and has already seen optho for this Continue to monitor and reach out if symptom(s) persist after interventions from optho in place  Dysautonomia [facility]); Gait abnormality Ongoing Recommend returning to sports medicine Dr Denyse Amass to further evaluate Continue plan to see neurology in December We placed two other neurology referrals -- one was denied (Dr Tat at [place]) and one is still pending (Dr Mila Homer)  Follow Up Instructions: I discussed the assessment and treatment plan with the patient. The patient was provided an opportunity to ask questions and all were answered. The patient agreed with the plan and demonstrated an understanding of the instructions.  A copy of instructions were sent to the patient via MyChart unless otherwise noted below.   The patient was advised to call back or seek an in-person evaluation if the symptoms worsen or if the condition fails to improve as anticipated.  [name], [place]

## 2023-06-01 ENCOUNTER — Other Ambulatory Visit: Payer: BC Managed Care – PPO

## 2023-06-03 DIAGNOSIS — F321 Major depressive disorder, single episode, moderate: Secondary | ICD-10-CM | POA: Diagnosis not present

## 2023-06-07 NOTE — Progress Notes (Signed)
Rubin Payor, PhD, LAT, ATC acting as a scribe for [name], MD.  Kathryn Park is a 37 y.o. female who presents to [facility] today for R shoulder pain and gait issues. Pt was previously seen by Dr. Denyse Amass in 2023-24 for rib pain and EDS.  Today, pt c/o R shoulder pain that's chronic in nature, worsening over the past 6 month. Pt is unable to specifically locate pain. Pt can spontaneously "pop" her R shoulder in/out of socket.   UE Numbness/tingling: yes- bilat UE Weakness: yes Treatments tried: CBD cream, Tylenol, heat, tiger balm  She also c/o difficulty w/ her gait ongoing for over a year. She was seen by neurology in Dec. Every wk or so she will experience episodes where she is unable to walk. She notes weakness in her LE and balance difficulty. Seems to be episodic. She also notes tinnitus bilaterally.  Pertinent review of systems: No fevers or chills  Relevant historical information: Ehlers-Danlos syndrome.  History of labrum tear right hip   Exam:  BP 124/76   Pulse 75   Ht 5\' 7"  (1.702 m)   Wt 138 lb (62.6 kg)   LMP 05/17/2023 (Approximate)   SpO2 99%   BMI 21.61 kg/m  General: Well Developed, well nourished, and in no acute distress.   MSK: Right shoulder: Normal-appearing Normal motion.  Crepitation felt with shoulder range of motion. Intact strength. Some laxity present with axial traction and with clunk and relocation test positive. Negative Yergason's and speeds test. Mildly positive anterior apprehension test and positive relocation test.  Gait: Currently normal.  Patient showed me a video of her gait abnormality and she has significant trunk instability or impaired balance during these episodes.    Lab and Radiology Results  X-ray images right shoulder obtained today personally and independently interpreted No acute fractures.  Mild AC DJD. Await formal radiology review    Assessment and  Plan: 37 y.o. female with chronic right shoulder pain and instability.  Part of the instability is probably joint laxity attributable to Ehlers-Danlos syndrome and hypermobility.  She may have a labrum tear as she has had episodes of shoulder subluxation or possibly even dislocation.  She is a good candidate for trial of physical therapy.  She has an established relationship with physical therapy at Dr. Rogue Jury office and will refer there.  She also has this odd intermittent impaired balance or trunk instability episodes.  She did have a partial workup with a neurologist in January where she had a relatively normal MRI of the brain and cervical spine. I think perhaps proceeding with further MRI of T-spine and L-spine would be helpful and have sent a message to Dr. Gladys Damme asking about this.  Also she is interested in a second opinion and I sent a message to Dr. Arbutus Leas asking about that as well. She mentions that if she is going to get an MRI she wonders about an upright MRI.  I explained that these are not very available and often have poor resolution but would be willing to entertain it if needed.   PDMP not reviewed this encounter. Orders Placed This Encounter  Procedures   DG Shoulder Right    Standing Status:   Future    Number of Occurrences:   1    Standing Expiration Date:   06/07/2024    Order Specific Question:   Reason for Exam (SYMPTOM  OR DIAGNOSIS REQUIRED)    Answer:  right shoulder pain    Order Specific Question:   Preferred imaging location?    Answer:   [facility]    Order Specific Question:   Is patient pregnant?    Answer:   No   Ambulatory referral to Physical Therapy    Referral Priority:   Routine    Referral Type:   Physical Medicine    Referral Reason:   Specialty Services Required    Requested Specialty:   Physical Therapy    Number of Visits Requested:   1   No orders of the defined types were placed in this encounter.    Discussed warning signs or  symptoms. Please see discharge instructions. Patient expresses understanding.   The above documentation has been reviewed and is accurate and complete [name], M.D.

## 2023-06-08 ENCOUNTER — Ambulatory Visit (INDEPENDENT_AMBULATORY_CARE_PROVIDER_SITE_OTHER): Payer: BC Managed Care – PPO | Admitting: Family Medicine

## 2023-06-08 ENCOUNTER — Encounter: Payer: Self-pay | Admitting: Family Medicine

## 2023-06-08 ENCOUNTER — Ambulatory Visit (INDEPENDENT_AMBULATORY_CARE_PROVIDER_SITE_OTHER): Payer: BC Managed Care – PPO

## 2023-06-08 VITALS — BP 124/76 | HR 75 | Ht 67.0 in | Wt 138.0 lb

## 2023-06-08 DIAGNOSIS — G8929 Other chronic pain: Secondary | ICD-10-CM | POA: Diagnosis not present

## 2023-06-08 DIAGNOSIS — M25511 Pain in right shoulder: Secondary | ICD-10-CM

## 2023-06-08 DIAGNOSIS — R269 Unspecified abnormalities of gait and mobility: Secondary | ICD-10-CM

## 2023-06-08 DIAGNOSIS — Q796 Ehlers-Danlos syndrome, unspecified: Secondary | ICD-10-CM | POA: Diagnosis not present

## 2023-06-08 NOTE — Patient Instructions (Addendum)
Thank you for coming in today.   Please get an Xray today before you leave   I've referred you to Physical Therapy.  Let us know if you don't hear from them in one week.   I will talk to your neurologist  Check back in 6-8 weeks

## 2023-06-08 NOTE — Telephone Encounter (Signed)
Forwarding to Dr. [name] as Juluis Rainier.

## 2023-06-09 ENCOUNTER — Ambulatory Visit
Admission: RE | Admit: 2023-06-09 | Discharge: 2023-06-09 | Disposition: A | Discharge status: Home / Self Care | Payer: BC Managed Care – PPO | Source: Ambulatory Visit | Attending: Obstetrics and Gynecology | Admitting: Obstetrics and Gynecology

## 2023-06-09 DIAGNOSIS — N6315 Unspecified lump in the right breast, overlapping quadrants: Secondary | ICD-10-CM

## 2023-06-09 DIAGNOSIS — N6311 Unspecified lump in the right breast, upper outer quadrant: Secondary | ICD-10-CM | POA: Diagnosis not present

## 2023-06-10 NOTE — Telephone Encounter (Signed)
If that problem persist we can get you to a neuro optometrist that specializes in this exact thing.

## 2023-06-13 NOTE — Progress Notes (Signed)
Right shoulder x-ray looks okay to radiology.

## 2023-06-14 ENCOUNTER — Ambulatory Visit (INDEPENDENT_AMBULATORY_CARE_PROVIDER_SITE_OTHER): Payer: BC Managed Care – PPO | Admitting: Internal Medicine

## 2023-06-14 ENCOUNTER — Other Ambulatory Visit (INDEPENDENT_AMBULATORY_CARE_PROVIDER_SITE_OTHER): Payer: BC Managed Care – PPO

## 2023-06-14 ENCOUNTER — Other Ambulatory Visit: Payer: Self-pay

## 2023-06-14 ENCOUNTER — Encounter: Payer: Self-pay | Admitting: Internal Medicine

## 2023-06-14 VITALS — BP 120/70 | HR 83 | Ht 66.0 in | Wt 134.0 lb

## 2023-06-14 DIAGNOSIS — R112 Nausea with vomiting, unspecified: Secondary | ICD-10-CM

## 2023-06-14 DIAGNOSIS — K219 Gastro-esophageal reflux disease without esophagitis: Secondary | ICD-10-CM | POA: Diagnosis not present

## 2023-06-14 DIAGNOSIS — K5900 Constipation, unspecified: Secondary | ICD-10-CM

## 2023-06-14 DIAGNOSIS — E2740 Unspecified adrenocortical insufficiency: Secondary | ICD-10-CM

## 2023-06-14 LAB — CORTISOL: Cortisol, Plasma: 2.5 ug/dL

## 2023-06-14 MED ORDER — OMEPRAZOLE 40 MG PO CPDR: 40.0000 mg | DELAYED_RELEASE_CAPSULE | Freq: Every day | ORAL | 2 refills | Status: DC

## 2023-06-14 MED ORDER — ONDANSETRON HCL 4 MG PO TABS: 4.0000 mg | ORAL_TABLET | Freq: Three times a day (TID) | ORAL | 1 refills | Status: DC | PRN

## 2023-06-14 NOTE — Progress Notes (Signed)
Hi Kathryn Park, please place a referral to endocrinology for adrenal insufficiency (suspected based upon a low cortisol lab value).

## 2023-06-14 NOTE — Progress Notes (Signed)
Chief Complaint: Nausea  HPI : 37 year old female with history of Ehlers-Danlos, migraine, anemia, and ovarian cancer s/p left oophorectomy presents for nausea  Interval History: She is no longer taking the omeprazole, stopped in 09/2022. She was recently diagnosed with Ehlers-Danlos syndrome. She switches between Allegra and Zyrtec to help treat allergic rhinitis. Antihistamines seem to also help with her GI symptoms. Her GI tract seems to be moving better than it was in the past. She saw Dr. Graciela Husbands and was tested for POTS. She did not fulfill full criteria for POTS, but she was told that she does have some symptoms that are consistent with dysautonomia. Endorses nausea that has been getting worse over the last year. Nausea has been fairly severe and worst in the morning. She has some vomiting but it has been minimal. She will just lay down and usually the nausea will go away on its own. She is taking about 3000 mg of sodium per day and drinks an electrolyte supplement throughout the day, which seems to help with the dysautonomy. She wears compression stockings. Endorses some chest burning and regurgitation. She has large burps ever since she stopped her omeprazole. Endorses headache mostly in the mornings, and she is not sure if this is associated with her nausea.  Her headaches have been longstanding and preceded her nausea and vomiting. She has also been having some gait issues and had a MRI with neurology. She is trying to get a second opinion with neurology. She just started low dose progesterone birth control that has shortened her menstrual periods. Denies overt dizziness or marijuana use. Denies alcohol use. She does not use any antinausea medications. She will use Dramamine if nausea symptoms get really bad. Her bowel habits have been irregular. Has alternating constipation and diarrhea. She has more constipation than diarrhea.  Wt Readings from Last 3 Encounters:  06/14/23 134 lb (60.8 kg)   06/08/23 138 lb (62.6 kg)  05/31/23 140 lb (63.5 kg)   Past Medical History:  Diagnosis Date   Abnormal Pap smear of cervix    Allergy    Anemia    suspected poor diet; vegetarian during teens   Anxiety    Asthma    childhood   Depression    Dysmenorrhea    Ehlers-Danlos syndrome    Family history of breast cancer    Family history of kidney cancer    Granulosa cell tumor of ovary    History of iron deficiency anemia    age 59   Joint pain    Labral tear of right hip joint    Leukocytosis    Low vitamin D level    Migraine with aura    never on rx or saw neurology   Ovarian cancer (HCC) 2021   Ovarian cyst    Left   Pneumonia 01/2012    left lower lobe   Pre-diabetes 2015   had a "borderline" A1c   STD (sexually transmitted disease)    HSV1   Wears contact lenses    Wears glasses      Past Surgical History:  Procedure Laterality Date   ADENOIDECTOMY     BREAST BIOPSY Right 04/2020   benign   COLPOSCOPY     LAPAROSCOPIC OVARIAN CYSTECTOMY Left 11/13/2019   Procedure: LAPAROSCOPIC OVARIAN CYSTECTOMY/POSSIBLE LEFT OOPHORECTOMY/ COLLECTION OF PELVIC WASHINGS/ks;  Surgeon: Patton Salles, MD;  Location: [facility];  Service: Gynecology;  Laterality: Left;  Possible left oophorectomy Colection of  pelvic washings To follow first case of the day   LEFT OOPHORECTOMY Left 11/2019   right hip arthroscopy  01/2022   for laberal tear repair   TONSILLECTOMY     UPPER GI ENDOSCOPY     Family History  Problem Relation Age of Onset   Asthma Mother    Allergic rhinitis Mother    Diabetes Mother    Hypertension Mother    Stroke Mother        TIA   Factor V Leiden deficiency Mother    Other Mother        pituitary tumor   Colon polyps Mother    Diverticulitis Mother    Allergic rhinitis Father    Eczema Father    Kidney cancer Father        cancerous tumor of kidney   Ulcerative colitis Father    Allergic rhinitis Brother    Eczema  Brother    Other Brother        ear tumor, dx. age 15-6   Breast cancer Maternal Grandmother        dx. 80s   Stroke Maternal Grandfather    Rheum arthritis Maternal Great-grandmother    Cancer Other    Ovarian cancer Neg Hx    Endometrial cancer Neg Hx    Colon cancer Neg Hx    Social History   Tobacco Use   Smoking status: Former    Current packs/day: 1.00    Types: Cigarettes    Passive exposure: Past   Smokeless tobacco: Never  Vaping Use   Vaping status: Never Used  Substance Use Topics   Alcohol use: Not Currently   Drug use: No   Current Outpatient Medications  Medication Sig Dispense Refill   albuterol (VENTOLIN HFA) 108 (90 Base) MCG/ACT inhaler Inhale 2 puffs into the lungs every 4 (four) hours as needed for wheezing or shortness of breath (coughing fits). 18 g 1   beclomethasone (QVAR) 80 MCG/ACT inhaler Inhale 2 puffs into the lungs 2 (two) times daily. Rinse mouth after each use. 1 each 5   busPIRone (BUSPAR) 5 MG tablet Take 1 tablet (5 mg total) by mouth 3 (three) times daily as needed (anxiety). 90 tablet 1   cyanocobalamin 1000 MCG tablet Take 1,000 mcg by mouth daily.     EPINEPHrine 0.3 mg/0.3 mL IJ SOAJ injection Inject 0.3 mg into the muscle as needed for anaphylaxis. 2 each 1   escitalopram (LEXAPRO) 20 MG tablet Take 1/2 tablet (10mg ) tablet by mouth daily for 2 weeks out of cycle 45 tablet 3   famotidine (PEPCID) 20 MG tablet Take 1 tablet (20 mg total) by mouth 2 (two) times daily. 60 tablet 3   fexofenadine (ALLEGRA) 180 MG tablet Take 180 mg by mouth in the morning and at bedtime.     magnesium oxide (MAG-OX) 400 (240 Mg) MG tablet Take 400 mg by mouth daily.     Melatonin 10 MG TABS Take 1 tablet by mouth at bedtime.     Multiple Vitamins-Minerals (MULTIPLE VITAMINS/WOMENS PO) Take 1 tablet by mouth daily in the afternoon.     norethindrone (ORTHO MICRONOR) 0.35 MG tablet Take 1 tablet (0.35 mg total) by mouth daily. 28 tablet 11   Turmeric 500 MG  CAPS Take 1 capsule by mouth daily in the afternoon.     valACYclovir (VALTREX) 1000 MG tablet Take 2 tablets (2000 mg) by mouth twice a day for 24 hours as needed for cold sore. 30 tablet  1   No current facility-administered medications for this visit.   Allergies  Allergen Reactions   Shellfish Allergy Anaphylaxis    Throat closes   Other Other (See Comments)    Watermelon, mold/mildew, pet dander, dust and dust mites  Swelling of eyes and congestion   Latex Rash   Sulfa Antibiotics Rash   Physical Exam: BP 120/70   Pulse 83   Ht 5\' 6"  (1.676 m)   Wt 134 lb (60.8 kg)   LMP 06/01/2023 (Approximate)   BMI 21.63 kg/m  Constitutional: Pleasant,well-developed, female in no acute distress. HEENT: Normocephalic and atraumatic. Conjunctivae are normal. No scleral icterus. Cardiovascular: Normal rate, regular rhythm.  Pulmonary/chest: Effort normal and breath sounds normal. No wheezing, rales or rhonchi. Abdominal: Soft, nondistended, nontender. Bowel sounds active throughout. There are no masses palpable. No hepatomegaly. Extremities: Bilateral lower extremities in compression stocks Neurological: Alert and oriented to person place and time. Skin: Skin is warm and dry. No rashes noted. Psychiatric: Normal mood and affect. Behavior is normal.  Labs 12/2021: CBC with low Hb of 11.8. CMP unremarkable.  Labs 02/2022: CBC normal.  Ferritin/IBC normal.  Labs 09/2022: CBC unremarkable.  Alpha gal panel was negative.  C3/C4 were normal.  Tryptase was normal.  CRP and ESR were normal.  TSH normal.  Vitamin B12 normal.  ANA negative.  CT A/P w/contrast 02/17/22: IMPRESSION: 1. Along the left posterior uterine margin, a 2.4 cc mixed density structure with high density component is observed. Questionably present a lower in density on 11/29/2019. Possibilities include scarring, ovarian remnant, small serosal fibroid, or less likely recurrent tumor. If clinically warranted, complimentary  assessment with pelvic MRI with and without contrast might be considered. 2. Although the aortomesenteric distance is reduced at 5 mm, the aortomesenteric angle is within normal limits 35 degrees and accordingly superior mesenteric artery syndrome is considered less likely.  EGD 02/26/22: - Normal esophagus. Biopsied.  - Gastritis. Biopsied.  - Duodenal erosions without bleeding. Biopsied. Path: 1. Surgical [P], duodenal biopsies REACTIVE DUODENAL MUCOSA WITH GASTRIC METAPLASIA COMPATIBLE WITH PEPTIC DUODENITIS 2. Surgical [P], gastric biopsies REACTIVE GASTROPATHY NEGATIVE FOR H. PYLORI, INTESTINAL METAPLASIA, DYSPLASIA AND CARCINOMA 3. Surgical [P], esophageal biopsies MILDLY REACTIVE SQUAMOUS MUCOSA NEGATIVE FOR GLANDULAR EPITHELIUM, EOSINOPHILS, DYSPLASIA AND CARCINOMA  Colonoscopy 02/26/22: - The examined portion of the ileum was normal.  - Three 3 to 12 mm polyps in the transverse colon and in the ascending colon, removed with a cold snare. Resected and retrieved.  - Non- bleeding internal hemorrhoids. Path: 4. Surgical [P], colon, ascending and transverse, polyp (3) SESSILE SERRATED POLYP WITHOUT CYTOLOGIC DYSPLASIA, 2 FRAGMENTS BENIGN LYMPHOID AGGREGATES  ASSESSMENT AND PLAN: Nausea and vomiting GERD Alternating constipation and diarrhea with constipation being slightly more prominent Patient now presents with issues with nausea and occasional vomiting.  She was recently diagnosed with Ehlers-Danlos and was told that she may have some features of dysautonomia.  Contributors to her nausea could include GERD, constipation, headaches, and/or dysautonomia.  Patient's nausea occurs mostly in the mornings and does not have a cyclical presentation. Will check a cortisol level, obtain a RUQ U/S to see if there are any signs of gallstones, and have the patient start omeprazole for GERD. Patient was previously ruled out for celiac disease with duodenal biopsies and did not have H. pylori  on her last set of gastric biopsies. - Check cortisol level - Start omeprazole 40 mg before dinner - Check RUQ U/S - Zofran 4 mg TID PRN - Next colonoscopy  for polyp surveillance due in 02/2025 - RTC in 2 months. Consider constipation treatments in the future  Eulah Pont, MD  I spent 44 minutes of time, including in depth chart review, independent review of results as outlined above, communicating results with the patient directly, face-to-face time with the patient, coordinating care, ordering studies and medications as appropriate, and documentation.

## 2023-06-14 NOTE — Patient Instructions (Addendum)
You have been scheduled for an abdominal ultrasound at [facility] (1st floor of hospital) on 06/17/23 at 10 am. Please arrive 30 minutes prior to your appointment for registration. Make certain not to have anything to eat or drink 6 hours prior to your appointment. Should you need to reschedule your appointment, please contact radiology at [phone]. This test typically takes about 30 minutes to perform.   Your provider has requested that you go to the basement level for lab work before leaving today. Press "B" on the elevator. The lab is located at the first door on the left as you exit the elevator.  We have sent the following medications to your pharmacy for you to pick up at your convenience: Omeprazole, Zofran  You are schedule for a follow up visit on 08/23/23 at 9:30 am If your blood pressure at your visit was 140/90 or greater, please contact your primary care physician to follow up on this.  _______________________________________________________  If you are age 58 or older, your body mass index should be between 23-30. Your Body mass index is 21.63 kg/m. If this is out of the aforementioned range listed, please consider follow up with your Primary Care Provider.  If you are age 27 or younger, your body mass index should be between 19-25. Your Body mass index is 21.63 kg/m. If this is out of the aformentioned range listed, please consider follow up with your Primary Care Provider.   ________________________________________________________  The [place] [facility] providers would like to encourage you to use [facility] to communicate with providers for non-urgent requests or questions.  Due to long hold times on the telephone, sending your provider a message by [facility] may be a faster and more efficient way to get a response.  Please allow 48 business hours for a response.  Please remember that this is for non-urgent requests.   _______________________________________________________   Thank you for entrusting me with your care and for choosing [facility], Dr. Eulah Pont

## 2023-06-15 ENCOUNTER — Encounter: Payer: Self-pay | Admitting: Gynecologic Oncology

## 2023-06-15 ENCOUNTER — Encounter: Payer: Self-pay | Admitting: Internal Medicine

## 2023-06-16 DIAGNOSIS — F321 Major depressive disorder, single episode, moderate: Secondary | ICD-10-CM | POA: Diagnosis not present

## 2023-06-17 ENCOUNTER — Ambulatory Visit (HOSPITAL_COMMUNITY)
Admission: RE | Admit: 2023-06-17 | Discharge: 2023-06-17 | Disposition: A | Discharge status: Home / Self Care | Payer: BC Managed Care – PPO | Source: Ambulatory Visit | Attending: Internal Medicine | Admitting: Internal Medicine

## 2023-06-17 DIAGNOSIS — K219 Gastro-esophageal reflux disease without esophagitis: Secondary | ICD-10-CM | POA: Diagnosis not present

## 2023-06-17 DIAGNOSIS — R112 Nausea with vomiting, unspecified: Secondary | ICD-10-CM | POA: Diagnosis not present

## 2023-06-17 DIAGNOSIS — R110 Nausea: Secondary | ICD-10-CM | POA: Diagnosis not present

## 2023-06-18 ENCOUNTER — Other Ambulatory Visit: Payer: Self-pay | Admitting: Allergy

## 2023-07-13 ENCOUNTER — Telehealth: Payer: Self-pay | Admitting: Internal Medicine

## 2023-07-13 NOTE — Telephone Encounter (Signed)
Inbound call from patient stating that last time she was seen [name] was going to refer her to a Endocrinologist. Patient states she has not heard anything and is wanting to follow up on referral. Please advise.

## 2023-07-19 ENCOUNTER — Encounter: Payer: Self-pay | Admitting: Family Medicine

## 2023-07-19 DIAGNOSIS — G8929 Other chronic pain: Secondary | ICD-10-CM

## 2023-07-19 DIAGNOSIS — R269 Unspecified abnormalities of gait and mobility: Secondary | ICD-10-CM

## 2023-07-20 NOTE — Telephone Encounter (Signed)
Forwarding to Dr. Denyse Amass as Lorain Childes.

## 2023-07-28 DIAGNOSIS — F321 Major depressive disorder, single episode, moderate: Secondary | ICD-10-CM | POA: Diagnosis not present

## 2023-08-02 NOTE — Telephone Encounter (Signed)
Forwarding to Dr. Corey to review and advise.  

## 2023-08-03 NOTE — Addendum Note (Signed)
Addended by: Rodolph Bong on: 08/03/2023 07:28 AM   Modules accepted: Orders

## 2023-08-08 ENCOUNTER — Telehealth: Payer: Self-pay

## 2023-08-08 ENCOUNTER — Telehealth: Payer: Self-pay | Admitting: *Deleted

## 2023-08-08 NOTE — Telephone Encounter (Signed)
Ms.Rupnow called office stating she has a follow up with Dr.Tucker on 11/15. Last year she had an ultrasound and was wondering if she is supposed to have one this year as well.   Pt is aware Dr.Tucker is out of the office, message sent and will call her with advice. Pt voiced an understanding.

## 2023-08-08 NOTE — Telephone Encounter (Signed)
[place], Kathryn Park, CMA; [place] Patient called stating she has reached out to [facility] who informed her they still don't have a referral on file for her. States that the woman she spoke to advised her to have PCP office call and make an appointment on her behalf. Patient really needs this completed since still having trouble walking on some days. Please Advise.

## 2023-08-08 NOTE — Telephone Encounter (Signed)
Per Warner Mccreedy NP, according to Dr.Tuckers last note, No routine imaging ordered at this time. Typically imaging for change in symptoms or tumor marker.   LVM for patient to call office.

## 2023-08-08 NOTE — Telephone Encounter (Signed)
Kathryn Park, can you help patient with this please. Referral is from June.

## 2023-08-09 ENCOUNTER — Encounter: Payer: Self-pay | Admitting: *Deleted

## 2023-08-09 NOTE — Telephone Encounter (Signed)
Noted  

## 2023-08-10 ENCOUNTER — Telehealth (INDEPENDENT_AMBULATORY_CARE_PROVIDER_SITE_OTHER): Payer: BC Managed Care – PPO | Admitting: Physician Assistant

## 2023-08-10 ENCOUNTER — Encounter: Payer: Self-pay | Admitting: Physician Assistant

## 2023-08-10 VITALS — Ht 66.0 in | Wt 135.0 lb

## 2023-08-10 DIAGNOSIS — R270 Ataxia, unspecified: Secondary | ICD-10-CM | POA: Diagnosis not present

## 2023-08-10 NOTE — Progress Notes (Unsigned)
Virtual Visit via Video Note   I, [name], PA , connected with  Kathryn Park  ([id number], [birth year]) on 08/10/23 at  3:00 PM EST by a video-enabled telemedicine application and verified that I am speaking with the correct person using two identifiers.  Location: Patient: Home Provider: [place] [facility] [place] office   I discussed the limitations of evaluation and management by telemedicine and the availability of in person appointments. The patient expressed understanding and agreed to proceed.    History of Present Illness: Kathryn Park is a 37 y.o. who identifies as a female who was assigned female at birth, and is being seen today for neurologic problem. She is hoping to see a neurologist soon about some issues with balance and trunk instability.  Per recent sports medicine visit: "She also has this odd intermittent impaired balance or trunk instability episodes." She has video evidence of these symptoms occurring. She has tried physical therapy which has not been helpful.  She has had some work-up with [facility] for Muscle Spasms and had work-up with unrevealing findings. She is interested in another referral as her symptom(s) seem to be progressing.  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 09/29/2022   Other adverse food reactions, not elsewhere classified, subsequent encounter 09/29/2022   Other allergic rhinitis 09/29/2022   Rash and other nonspecific skin eruption 09/29/2022   Pruritus 09/29/2022   Mild intermittent asthma without complication 09/29/2022   Gastroesophageal reflux disease 09/29/2022   EDS (Ehlers-Danlos syndrome) 06/10/2022   Tear of right acetabular labrum 07/22/2021   Breast lump 02/12/2021   Bilateral hip pain 11/25/2020   Positive ANA (antinuclear antibody) 11/25/2020   Other fatigue 11/25/2020    Bladder retention 10/13/2020   HSV-1 (herpes simplex virus 1) infection 10/13/2020   History of anorexia nervosa 10/13/2020   GAD (generalized anxiety disorder) 10/13/2020   Family history of breast cancer    Family history of kidney cancer    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 Rinse mouth after each use., Disp: 1 each, Rfl: 5   busPIRone (BUSPAR) 5 MG tablet, Take 1 tablet (5 mg total) by mouth 3 (three) times daily as needed (anxiety)., Disp: 90 tablet, Rfl: 1   cyanocobalamin 1000 MCG tablet, Take 1,000 mcg by mouth daily., Disp: , Rfl:    EPINEPHrine 0.3 mg/0.3 mL IJ SOAJ injection, Inject 0.3 mg into the muscle as needed for anaphylaxis., Disp: 2 each, Rfl: 1   escitalopram (LEXAPRO) 20 MG tablet, Take 1/2 tablet (10mg ) tablet by mouth daily for 2 weeks out of cycle, Disp: 45 tablet, Rfl: 3   famotidine (PEPCID) 20 MG tablet, Take 1 tablet (20 mg total) by mouth 2 (two) times daily., Disp: 60 tablet, Rfl: 3  fexofenadine (ALLEGRA) 180 MG tablet, Take 180 mg by mouth in the morning and at bedtime., Disp: , Rfl:    magnesium oxide (MAG-OX) 400 (240 Mg) MG tablet, Take 400 mg by mouth daily., Disp: , Rfl:    Melatonin 10 MG TABS, Take 1 tablet by mouth at bedtime., Disp: , Rfl:    Multiple Vitamins-Minerals (MULTIPLE VITAMINS/WOMENS PO), Take 1 tablet by mouth daily in the afternoon., Disp: , Rfl:     norethindrone (ORTHO MICRONOR) 0.35 MG tablet, Take 1 tablet (0.35 mg total) by mouth daily., Disp: 28 tablet, Rfl: 11   omeprazole (PRILOSEC) 40 MG capsule, Take 1 capsule (40 mg total) by mouth daily., Disp: 30 capsule, Rfl: 2   ondansetron (ZOFRAN) 4 MG tablet, Take 1 tablet (4 mg total) by mouth 3 (three) times daily as needed for nausea or vomiting., Disp: 30 tablet, Rfl: 1   Turmeric 500 MG CAPS, Take 1 capsule by mouth daily in the afternoon., Disp: , Rfl:    valACYclovir (VALTREX) 1000 MG tablet, Take 2 tablets (2000 mg) by mouth twice a day for 24 hours as needed for cold sore., Disp: 30 tablet, Rfl: 1  Observations/Objective: Patient is well-developed, well-nourished in no acute distress.  Resting comfortably  at home.  Head is normocephalic, atraumatic.  No labored breathing.  Speech is clear and coherent with logical content.  Patient is alert and oriented at baseline.  Tearful  Assessment and Plan: 1. Ataxia, unspecified Will place referral for [facility] Moveement Disorder -- I have asked patient if agreeable and she will let me know   Follow Up Instructions: I discussed the assessment and treatment plan with the patient. The patient was provided an opportunity to ask questions and all were answered. The patient agreed with the plan and demonstrated an understanding of the instructions.  A copy of instructions were sent to the patient via MyChart unless otherwise noted below.   The patient was advised to call back or seek an in-person evaluation if the symptoms worsen or if the condition fails to improve as anticipated.  [name], [place]

## 2023-08-11 DIAGNOSIS — F321 Major depressive disorder, single episode, moderate: Secondary | ICD-10-CM | POA: Diagnosis not present

## 2023-08-15 ENCOUNTER — Other Ambulatory Visit: Payer: Self-pay | Admitting: Physician Assistant

## 2023-08-15 DIAGNOSIS — R270 Ataxia, unspecified: Secondary | ICD-10-CM

## 2023-08-17 ENCOUNTER — Encounter: Payer: Self-pay | Admitting: Gynecologic Oncology

## 2023-08-18 ENCOUNTER — Other Ambulatory Visit: Payer: Self-pay | Admitting: Gynecologic Oncology

## 2023-08-18 DIAGNOSIS — D3912 Neoplasm of uncertain behavior of left ovary: Secondary | ICD-10-CM

## 2023-08-19 ENCOUNTER — Inpatient Hospital Stay: Payer: BC Managed Care – PPO | Attending: Gynecologic Oncology | Admitting: Gynecologic Oncology

## 2023-08-19 ENCOUNTER — Inpatient Hospital Stay: Payer: BC Managed Care – PPO

## 2023-08-19 ENCOUNTER — Encounter: Payer: Self-pay | Admitting: Gynecologic Oncology

## 2023-08-19 VITALS — BP 131/71 | HR 81 | Resp 19 | Wt 133.4 lb

## 2023-08-19 DIAGNOSIS — G901 Familial dysautonomia [Riley-Day]: Secondary | ICD-10-CM | POA: Insufficient documentation

## 2023-08-19 DIAGNOSIS — D3912 Neoplasm of uncertain behavior of left ovary: Secondary | ICD-10-CM | POA: Diagnosis not present

## 2023-08-19 DIAGNOSIS — N939 Abnormal uterine and vaginal bleeding, unspecified: Secondary | ICD-10-CM | POA: Diagnosis not present

## 2023-08-19 DIAGNOSIS — Z79890 Hormone replacement therapy (postmenopausal): Secondary | ICD-10-CM | POA: Insufficient documentation

## 2023-08-19 NOTE — Patient Instructions (Signed)
It was good to see you today.  I will release your labs once back.  I will call you with your ultrasound to discuss if an endometrial biopsy (biopsy of your uterus) is needed.

## 2023-08-19 NOTE — Progress Notes (Signed)
Gynecologic Oncology Return Clinic Visit  08/19/23  Reason for Visit: surveillance in the setting of granulosa cell tumor   Treatment History: Oncology History  Granulosa cell tumor of left ovary  10/18/2019 Imaging   Pelvic ultrasound: 11 x 7 x 9 cm left ovarian cyst, avascular with thin septation.  No free fluid.  Right ovary normal in appearance.   11/13/2019 Surgery   Laparoscopic left oophorectomy with collection of pelvic washings, lysis of adhesion.  Findings at the time of surgery were a 12 cm left ovarian cyst.  No ascites or intra-abdominal/pelvic disease.  Ovary was removed in a contained fashion and in an Endo Catch bag.   11/13/2019 Pathology Results   Left ovary showing granulosa cell tumor, 2.1 cm.  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 Pelvic washings negative for malignant cells.   11/13/2019 Initial Diagnosis   Granulosa cell tumor of left ovary   11/29/2019 Imaging   CT A/P: No acute findings in the abdomen or pelvis.  Specifically, no evidence for metastatic disease in the abdomen or pelvis. Trace free fluid in the cul-de-sac.  This can be physiologic in a premenopausal female.   12/27/2019 Tumor Marker   Patient's blood was tested for the following markers: Inh B, AMH Results of the tumor marker test revealed: 83, 1.07.     Interval History: Doing well.  Struggling more with movement related issues including balance and walking.  Hoping to be seen at the movement clinic at [facility].  Has follow-up with neurologist in December.  Has had increasing nausea as well as GERD symptoms and belching.  Baseline bowel function.  Denies any pelvic pain but has intermittent right sided abdominal pain.  Continues to take progesterone only pills.  Notes that for the last 7 weeks she has had a couple of days each week as if her menses was about to start but it does not.  This has continued cyclically every week.  Since her last visit with me, she  had cortisol testing which was low.  Past Medical/Surgical History: Past Medical History:  Diagnosis Date   Abnormal Pap smear of cervix    Allergy    Anemia    suspected poor diet; vegetarian during teens   Anxiety    Asthma    childhood   Depression    Dysmenorrhea    Ehlers-Danlos syndrome    Family history of breast cancer    Family history of kidney cancer    Granulosa cell tumor of ovary    History of iron deficiency anemia    age 37   Joint pain    Labral tear of right hip joint    Leukocytosis    Low vitamin D level    Migraine with aura    never on rx or saw neurology   Ovarian cancer (HCC) 2021   Ovarian cyst    Left   Pneumonia 01/2012    left lower lobe   Pre-diabetes 2015   had a "borderline" A1c   STD (sexually transmitted disease)    HSV1   Wears contact lenses    Wears glasses     Past Surgical History:  Procedure Laterality Date   ADENOIDECTOMY     BREAST BIOPSY Right 04/2020   benign   COLPOSCOPY     LAPAROSCOPIC OVARIAN CYSTECTOMY Left 11/13/2019   Procedure: LAPAROSCOPIC OVARIAN CYSTECTOMY/POSSIBLE LEFT OOPHORECTOMY/ COLLECTION OF PELVIC WASHINGS/ks;  Surgeon: Patton Salles, MD;  Location: [facility];  Service: Gynecology;  Laterality: Left;  Possible left oophorectomy Colection of pelvic washings To follow first case of the day   LEFT OOPHORECTOMY Left 11/2019   right hip arthroscopy  01/2022   for laberal tear repair   TONSILLECTOMY     UPPER GI ENDOSCOPY      Family History  Problem Relation Age of Onset   Asthma Mother    Allergic rhinitis Mother    Diabetes Mother    Hypertension Mother    Stroke Mother        TIA   Factor V Leiden deficiency Mother    Other Mother        pituitary tumor   Colon polyps Mother    Diverticulitis Mother    Allergic rhinitis Father    Eczema Father    Kidney cancer Father        cancerous tumor of kidney   Ulcerative colitis Father    Allergic rhinitis Brother     Eczema Brother    Other Brother        ear tumor, dx. age 35-6   Breast cancer Maternal Grandmother        dx. 80s   Stroke Maternal Grandfather    Rheum arthritis Maternal Great-grandmother    Cancer Other    Ovarian cancer Neg Hx    Endometrial cancer Neg Hx    Colon cancer Neg Hx     Social History   Socioeconomic History   Marital status: Married    Spouse name: Not on file   Number of children: 0   Years of education: Not on file   Highest education level: Not on file  Occupational History   Not on file  Tobacco Use   Smoking status: Former    Current packs/day: 1.00    Types: Cigarettes    Passive exposure: Past   Smokeless tobacco: Never  Vaping Use   Vaping status: Never Used  Substance and Sexual Activity   Alcohol use: Not Currently   Drug use: No   Sexual activity: Yes    Partners: Male    Birth control/protection: Surgical, Pill    Comment: vasectomy  Other Topics Concern   Not on file  Social History Narrative   Moved from [place]   Into Consulting civil engineer   Married   Working part-time in Development worker, community Determinants of Corporate investment banker Strain: Not on file  Food Insecurity: Not on file  Transportation Needs: Not on file  Physical Activity: Not on file  Stress: Not on file  Social Connections: Not on file    Current Medications:  Current Outpatient Medications:    albuterol (VENTOLIN HFA) 108 (90 Base) MCG/ACT inhaler, INHALE 2 PUFFS BY MOUTH EVERY 4 HOURS AS NEEDED FOR WHEEZING OR SHORTNESS OF BREATH (COUGHING FITS), Disp: 18 g, Rfl: 0   beclomethasone (QVAR) 80 MCG/ACT inhaler, Inhale 2 puffs into the lungs 2 (two) times daily. Rinse mouth after each use., Disp: 1 each, Rfl: 5   busPIRone (BUSPAR) 5 MG tablet, Take 1 tablet (5 mg total) by mouth 3 (three) times daily as needed (anxiety)., Disp: 90 tablet, Rfl: 1   cyanocobalamin 1000 MCG tablet, Take 1,000 mcg by mouth daily., Disp: , Rfl:    EPINEPHrine 0.3 mg/0.3 mL IJ  SOAJ injection, Inject 0.3 mg into the muscle as needed for anaphylaxis., Disp: 2 each, Rfl: 1   escitalopram (LEXAPRO) 20 MG tablet, Take 1/2 tablet (10mg ) tablet by mouth daily  for 2 weeks out of cycle, Disp: 45 tablet, Rfl: 3   famotidine (PEPCID) 20 MG tablet, Take 1 tablet (20 mg total) by mouth 2 (two) times daily., Disp: 60 tablet, Rfl: 3   fexofenadine (ALLEGRA) 180 MG tablet, Take 180 mg by mouth in the morning and at bedtime., Disp: , Rfl:    magnesium oxide (MAG-OX) 400 (240 Mg) MG tablet, Take 400 mg by mouth daily., Disp: , Rfl:    Melatonin 10 MG TABS, Take 1 tablet by mouth at bedtime., Disp: , Rfl:    Multiple Vitamins-Minerals (MULTIPLE VITAMINS/WOMENS PO), Take 1 tablet by mouth daily in the afternoon., Disp: , Rfl:    norethindrone (ORTHO MICRONOR) 0.35 MG tablet, Take 1 tablet (0.35 mg total) by mouth daily., Disp: 28 tablet, Rfl: 11   omeprazole (PRILOSEC) 40 MG capsule, Take 1 capsule (40 mg total) by mouth daily., Disp: 30 capsule, Rfl: 2   ondansetron (ZOFRAN) 4 MG tablet, Take 1 tablet (4 mg total) by mouth 3 (three) times daily as needed for nausea or vomiting., Disp: 30 tablet, Rfl: 1   Turmeric 500 MG CAPS, Take 1 capsule by mouth daily in the afternoon., Disp: , Rfl:    valACYclovir (VALTREX) 1000 MG tablet, Take 2 tablets (2000 mg) by mouth twice a day for 24 hours as needed for cold sore., Disp: 30 tablet, Rfl: 1  Review of Systems: + appetite changes Denies fevers, chills, fatigue, unexplained weight changes. Denies hearing loss, neck lumps or masses, mouth sores, ringing in ears or voice changes. Denies cough or wheezing.  Denies shortness of breath. Denies chest pain or palpitations. Denies leg swelling. Denies abdominal distention, pain, blood in stools, constipation, diarrhea, nausea, vomiting, or early satiety. Denies pain with intercourse, dysuria, frequency, hematuria or incontinence. Denies hot flashes, pelvic pain, vaginal bleeding or vaginal discharge.    Denies joint pain, back pain or muscle pain/cramps. Denies itching, rash, or wounds. Denies dizziness, headaches, numbness or seizures. Denies swollen lymph nodes or glands, denies easy bruising or bleeding. Denies anxiety, depression, confusion, or decreased concentration.  Physical Exam: BP 131/71 (BP Location: Left Arm, Patient Position: Sitting)   Pulse 81   Resp 19   Wt 133 lb 6.4 oz (60.5 kg)   LMP 07/13/2023 (Approximate)   SpO2 97%   BMI 21.53 kg/m  General: Alert, oriented, no acute distress. HEENT: Normocephalic, atraumatic, sclera anicteric. Chest: Clear to auscultation bilaterally.  No wheezes or rhonchi. Cardiovascular: Regular rate and rhythm, no murmurs. Abdomen: soft, nontender.  Normoactive bowel sounds.  No masses or hepatosplenomegaly appreciated.  Well-healed incisions. Extremities: Grossly normal range of motion.  Warm, well perfused.  No edema bilaterally.  Skin: No rashes or lesions noted. Lymphatics: No cervical, supraclavicular, or inguinal adenopathy. GU: Normal appearing external genitalia without erythema, excoriation, or lesions.  Speculum exam reveals well rugated vaginal mucosa, no lesions or masses.  Cervix is normal in appearance, somewhat posterior facing. Bimanual exam reveals small mobile uterus, no adnexal masses appreciated, no nodularity.    Laboratory & Radiologic Studies: Component Ref Range & Units 5 mo ago (02/25/23) 11 mo ago (08/25/22) 1 yr ago (01/11/22) 2 yr ago (08/10/21) 2 yr ago (02/13/21) 3 yr ago (08/18/20) 3 yr ago (02/20/20)  Inhibin B pg/mL 96.4 8.7 CM 56.0 CM 32.7 CM 48.0 CM 71.0 CM 14.9 CM   Component Ref Range & Units 5 mo ago (02/25/23) 11 mo ago (08/25/22) 1 yr ago (01/11/22) 2 yr ago (08/10/21) 2 yr ago (02/13/21) 3 yr  ago (08/18/20) 3 yr ago (02/20/20)  ANTI-MULLERIAN HORMONE (AMH) ng/mL 1.02 1.33 CM 1.70 CM 0.471 CM 2.57 CM 3.01 CM 2.08 CM   Assessment & Plan: Kathryn Park is a 37 y.o. 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 Last ultrasound 08/2022: normal right ovary.   The patient continues to struggle some with her symptoms related to dysautonomia, balance issues.  She also has worsening GI symptoms.  Although I suspect that symptoms are unrelated to her granulosa cell tumor history, we discussed getting an ultrasound to assure no adnexal masses.  No masses appreciated on pelvic exam today.  We also discussed her abnormal bleeding over the last 2 months.  She has been on low-dose progesterone to help with her hormone cycling and her dysautonomia.  We discussed that granulosa cell tumors can produce estrogen and further workup of her abnormal bleeding is indicated.  We will start with the ultrasound as above.  Discussed that we may need to proceed with endometrial biopsy depending on ultrasound findings or if abnormal bleeding is persistent.   We reviewed signs and symptoms again that would be concerning for disease recurrence.  Per [facility] surveillance recommendations, we will continue on visits every 6 months.  She knows to call if she develops any symptoms before her next scheduled visit.  I will plan to repeat labs at her next visit.  No routine imaging ordered at this time.  We discussed that typically, we would image for change in symptoms or tumor marker.  24 minutes of total time was spent for this patient encounter, including preparation, face-to-face counseling with the patient and coordination of care, and documentation of the encounter.  Eugene Garnet, MD  Division of Gynecologic Oncology  Department of Obstetrics and Gynecology  [facility]

## 2023-08-22 LAB — INHIBIN B: Inhibin B: 88.2 pg/mL

## 2023-08-23 ENCOUNTER — Ambulatory Visit: Payer: BC Managed Care – PPO | Admitting: Internal Medicine

## 2023-08-24 ENCOUNTER — Ambulatory Visit (HOSPITAL_COMMUNITY)
Admission: RE | Admit: 2023-08-24 | Discharge: 2023-08-24 | Disposition: A | Discharge status: Home / Self Care | Payer: BC Managed Care – PPO | Source: Ambulatory Visit | Attending: Gynecologic Oncology | Admitting: Gynecologic Oncology

## 2023-08-24 DIAGNOSIS — D3912 Neoplasm of uncertain behavior of left ovary: Secondary | ICD-10-CM | POA: Diagnosis not present

## 2023-08-25 DIAGNOSIS — F321 Major depressive disorder, single episode, moderate: Secondary | ICD-10-CM | POA: Diagnosis not present

## 2023-09-01 LAB — ANTI MULLERIAN HORMONE: ANTI-MULLERIAN HORMONE (AMH): 1.39 ng/mL

## 2023-09-09 ENCOUNTER — Other Ambulatory Visit: Payer: Self-pay

## 2023-09-09 DIAGNOSIS — E2740 Unspecified adrenocortical insufficiency: Secondary | ICD-10-CM

## 2023-09-12 ENCOUNTER — Encounter: Payer: Self-pay | Admitting: Diagnostic Neuroimaging

## 2023-09-12 ENCOUNTER — Ambulatory Visit (INDEPENDENT_AMBULATORY_CARE_PROVIDER_SITE_OTHER): Payer: BC Managed Care – PPO | Admitting: Diagnostic Neuroimaging

## 2023-09-12 VITALS — BP 108/70 | HR 81 | Ht 66.0 in | Wt 136.0 lb

## 2023-09-12 DIAGNOSIS — R270 Ataxia, unspecified: Secondary | ICD-10-CM | POA: Diagnosis not present

## 2023-09-12 NOTE — Progress Notes (Signed)
[facility]  PATIENT: Kathryn Park DOB: 1985/10/12  REFERRING CLINICIAN: [name], PA HISTORY FROM: PATIENT AND HUSBAND REASON FOR VISIT: NEW CONSULT   HISTORICAL  CHIEF COMPLAINT:  Chief Complaint  Patient presents with   Follow-up    Rm 8 with spouse Kathryn Park Pt is well, reports her gait has progressed significantly since last visit. She does fall and loose balance regularly.     HISTORY OF PRESENT ILLNESS:   UPDATE (09/12/23, VRP): Since last visit, patient returns for progression of symptoms.  Now having intermittent episodes of gait and balance difficulty where she has trouble coordinating her body and legs.  She feels a disconnection between her brain and her body.  Also having some issues with early morning nausea, orthostatic hypotension and hypermobility.  PRIOR HPI (09/25/22): 37 year old female here for abnormal sensations.  Since 2020/10/01 patient has had increasing fatigue, brain fog, word recall problems.  Also was having decreased appetite, unintentional weight loss, abnormal sensation and pain and pressure.  Around that time she was diagnosed with ovarian cancer and treated with surgery.  She was having increasing stress and anxiety around that time related to her medical issues.  She was also had an caregiver for her mother who suffered from stroke and dementia, passed away in 10/02/2019. Has a history of anxiety back to high school.  Also has ADHD since young age.  Has more recent diagnosis of Ehlers-Danlos syndrome in October 2023 related to joint hypermobility.  Patient had right hip labral tear and treated with surgery in April 2023.  In summer 2023 started to have issues of involuntary muscle jerks and spasms in her arms, legs, torso.  Sometimes she would have a "dip" sensation where her muscle tone would suddenly give out and she would almost fall.  This seemed to happen randomly without specific triggers.  No remote history of  motor tics or similar muscle spasms when she was younger.   REVIEW OF SYSTEMS: Full 14 system review of systems performed and negative with exception of: as per HPI.  ALLERGIES: Allergies  Allergen Reactions   Shellfish Allergy Anaphylaxis    Throat closes   Other Other (See Comments)    Watermelon, mold/mildew, pet dander, dust and dust mites  Swelling of eyes and congestion   Latex Rash   Sulfa Antibiotics Rash    HOME MEDICATIONS: Outpatient Medications Prior to Visit  Medication Sig Dispense Refill   albuterol (VENTOLIN HFA) 108 (90 Base) MCG/ACT inhaler INHALE 2 PUFFS BY MOUTH EVERY 4 HOURS AS NEEDED FOR WHEEZING OR SHORTNESS OF BREATH (COUGHING FITS) 18 g 0   beclomethasone (QVAR) 80 MCG/ACT inhaler Inhale 2 puffs into the lungs 2 (two) times daily. Rinse mouth after each use. 1 each 5   busPIRone (BUSPAR) 5 MG tablet Take 1 tablet (5 mg total) by mouth 3 (three) times daily as needed (anxiety). 90 tablet 1   cyanocobalamin 1000 MCG tablet Take 1,000 mcg by mouth daily.     EPINEPHrine 0.3 mg/0.3 mL IJ SOAJ injection Inject 0.3 mg into the muscle as needed for anaphylaxis. 2 each 1   escitalopram (LEXAPRO) 20 MG tablet Take 1/2 tablet (10mg ) tablet by mouth daily for 2 weeks out of cycle 45 tablet 3   famotidine (PEPCID) 20 MG tablet Take 1 tablet (20 mg total) by mouth 2 (two) times daily. 60 tablet 3   fexofenadine (ALLEGRA) 180 MG tablet Take 180 mg by mouth in the morning and at bedtime.  magnesium oxide (MAG-OX) 400 (240 Mg) MG tablet Take 400 mg by mouth daily.     Melatonin 10 MG TABS Take 1 tablet by mouth at bedtime.     Multiple Vitamins-Minerals (MULTIPLE VITAMINS/WOMENS PO) Take 1 tablet by mouth daily in the afternoon.     omeprazole (PRILOSEC) 40 MG capsule Take 1 capsule (40 mg total) by mouth daily. 30 capsule 2   ondansetron (ZOFRAN) 4 MG tablet Take 1 tablet (4 mg total) by mouth 3 (three) times daily as needed for nausea or vomiting. 30 tablet 1    Turmeric 500 MG CAPS Take 1 capsule by mouth daily in the afternoon.     valACYclovir (VALTREX) 1000 MG tablet Take 2 tablets (2000 mg) by mouth twice a day for 24 hours as needed for cold sore. 30 tablet 1   norethindrone (ORTHO MICRONOR) 0.35 MG tablet Take 1 tablet (0.35 mg total) by mouth daily. (Patient not taking: Reported on 09/12/2023) 28 tablet 11   No facility-administered medications prior to visit.    PAST MEDICAL HISTORY: Past Medical History:  Diagnosis Date   Abnormal Pap smear of cervix    Allergy    Anemia    suspected poor diet; vegetarian during teens   Anxiety    Asthma    childhood   Depression    Dysmenorrhea    Ehlers-Danlos syndrome    Family history of breast cancer    Family history of kidney cancer    Granulosa cell tumor of ovary    History of iron deficiency anemia    age 32   Joint pain    Labral tear of right hip joint    Leukocytosis    Low vitamin D level    Migraine with aura    never on rx or saw neurology   Ovarian cancer (HCC) 2021   Ovarian cyst    Left   Pneumonia 01/2012    left lower lobe   Pre-diabetes 2015   had a "borderline" A1c   STD (sexually transmitted disease)    HSV1   Wears contact lenses    Wears glasses     PAST SURGICAL HISTORY: Past Surgical History:  Procedure Laterality Date   ADENOIDECTOMY     BREAST BIOPSY Right 04/2020   benign   COLPOSCOPY     LAPAROSCOPIC OVARIAN CYSTECTOMY Left 11/13/2019   Procedure: LAPAROSCOPIC OVARIAN CYSTECTOMY/POSSIBLE LEFT OOPHORECTOMY/ COLLECTION OF PELVIC WASHINGS/ks;  Surgeon: Patton Salles, MD;  Location: [facility];  Service: Gynecology;  Laterality: Left;  Possible left oophorectomy Colection of pelvic washings To follow first case of the day   LEFT OOPHORECTOMY Left 11/2019   right hip arthroscopy  01/2022   for laberal tear repair   TONSILLECTOMY     UPPER GI ENDOSCOPY      FAMILY HISTORY: Family History  Problem Relation Age of  Onset   Asthma Mother    Allergic rhinitis Mother    Diabetes Mother    Hypertension Mother    Stroke Mother        TIA   Factor V Leiden deficiency Mother    Other Mother        pituitary tumor   Colon polyps Mother    Diverticulitis Mother    Allergic rhinitis Father    Eczema Father    Kidney cancer Father        cancerous tumor of kidney   Ulcerative colitis Father    Allergic rhinitis Brother  Eczema Brother    Other Brother        ear tumor, dx. age 22-6   Breast cancer Maternal Grandmother        dx. 80s   Stroke Maternal Grandfather    Rheum arthritis Maternal Great-grandmother    Cancer Other    Ovarian cancer Neg Hx    Endometrial cancer Neg Hx    Colon cancer Neg Hx     SOCIAL HISTORY: Social History   Socioeconomic History   Marital status: Married    Spouse name: Not on file   Number of children: 0   Years of education: Not on file   Highest education level: Not on file  Occupational History   Not on file  Tobacco Use   Smoking status: Former    Current packs/day: 1.00    Types: Cigarettes    Passive exposure: Past   Smokeless tobacco: Never  Vaping Use   Vaping status: Never Used  Substance and Sexual Activity   Alcohol use: Not Currently   Drug use: No   Sexual activity: Yes    Partners: Male    Birth control/protection: Surgical, Pill    Comment: vasectomy  Other Topics Concern   Not on file  Social History Narrative   Moved from [place]   Into Consulting civil engineer   Married   Working part-time in Development worker, community Determinants of Corporate investment banker Strain: Not on file  Food Insecurity: Not on file  Transportation Needs: Not on file  Physical Activity: Not on file  Stress: Not on file  Social Connections: Not on file  Intimate Partner Violence: Not on file     PHYSICAL EXAM  GENERAL EXAM/CONSTITUTIONAL: Vitals:  Vitals:   09/12/23 1602  BP: 108/70  Pulse: 81  Weight: 136 lb (61.7 kg)  Height: 5\' 6"   (1.676 m)   Body mass index is 21.95 kg/m. Wt Readings from Last 3 Encounters:  09/12/23 136 lb (61.7 kg)  08/19/23 133 lb 6.4 oz (60.5 kg)  08/10/23 135 lb (61.2 kg)   Patient is in no distress; well developed, nourished and groomed; neck is supple  CARDIOVASCULAR: Examination of carotid arteries is normal; no carotid bruits Regular rate and rhythm, no murmurs Examination of peripheral vascular system by observation and palpation is normal  EYES: Ophthalmoscopic exam of optic discs and posterior segments is normal; no papilledema or hemorrhages No results found.  MUSCULOSKELETAL: Gait, strength, tone, movements noted in Neurologic exam below  NEUROLOGIC: MENTAL STATUS:      No data to display         awake, alert, oriented to person, place and time recent and remote memory intact normal attention and concentration language fluent, comprehension intact, naming intact fund of knowledge appropriate  CRANIAL NERVE:  2nd - no papilledema on fundoscopic exam; PHOTOSENSITIVE 2nd, 3rd, 4th, 6th - pupils equal and reactive to light, visual fields full to confrontation, extraocular muscles intact, no nystagmus 5th - facial sensation symmetric 7th - facial strength symmetric 8th - hearing intact 9th - palate elevates symmetrically, uvula midline 11th - shoulder shrug symmetric 12th - tongue protrusion midline  MOTOR:  normal bulk and tone, full strength in the BUE, BLE  SENSORY:  normal and symmetric to light touch, temperature, vibration  COORDINATION:  finger-nose-finger, fine finger movements SLOW  REFLEXES:  deep tendon reflexes 1+ and symmetric; HYPERSENSITIVE TO REFLEX TESTING  GAIT/STATION:  narrow based gait; USING CANE; ABNL LURCHING, IRREGULAR ATAXIC GAIT, WITH  HIPS FORWARD, ABDOMEN FLEXED, SHOULDERS BACK     DIAGNOSTIC DATA (LABS, IMAGING, TESTING) - I reviewed patient records, labs, notes, testing and imaging myself where available.  Lab Results   Component Value Date   WBC 8.3 09/29/2022   HGB 13.9 09/29/2022   HCT 42.3 09/29/2022   MCV 88 09/29/2022   PLT 256 09/29/2022      Component Value Date/Time   NA 139 09/29/2022 1127   K 4.2 09/29/2022 1127   CL 101 09/29/2022 1127   CO2 24 09/29/2022 1127   GLUCOSE 90 09/29/2022 1127   GLUCOSE 82 12/23/2021 1507   BUN 13 09/29/2022 1127   CREATININE 0.66 09/29/2022 1127   CREATININE 0.75 09/04/2020 1150   CALCIUM 9.8 09/29/2022 1127   PROT 7.3 09/29/2022 1127   ALBUMIN 4.8 09/29/2022 1127   AST 21 09/29/2022 1127   AST 11 (L) 09/04/2020 1150   ALT 13 09/29/2022 1127   ALT 8 09/04/2020 1150   ALKPHOS 60 09/29/2022 1127   BILITOT 0.2 09/29/2022 1127   BILITOT 0.3 09/04/2020 1150   GFRNONAA >60 09/04/2020 1150   GFRAA 106 10/11/2019 1013   Lab Results  Component Value Date   CHOL 142 10/11/2019   HDL 64 10/11/2019   LDLCALC 70 10/11/2019   TRIG 29 10/11/2019   CHOLHDL 2.2 10/11/2019   Lab Results  Component Value Date   HGBA1C 5.6 09/24/2022   Lab Results  Component Value Date   VITAMINB12 757 09/24/2022   Lab Results  Component Value Date   TSH 1.490 09/24/2022    10/12/22 Normal MRI brain (with and without).   10/12/22 MRI cervical spine with and without contrast imaging: - At C5-6 mild disc bulging with mild right and moderate left foraminal stenosis. - No intrinsic, compressive or abnormal enhancing spinal cord lesions.     ASSESSMENT AND PLAN  37 y.o. year old female here with:   Dx:  1. Ataxia     PLAN:  MUSCLE TWITCHES, BRAIN FOG, TINGLING, LOSS OF MUSCLE TONE, DECR APPETITE (also history of anxiety, ADHD, EDS; will proceed with demyelinating dz and neuromuscular screening workup) - check labs (SCA panel, paraneoplastic panel) - check MRI thoracic and lumbar spine (complete workup of neuraxis) - may consider EMG/NCS (then addl lab and nerve testing)  Orders Placed This Encounter  Procedures   MR THORACIC SPINE W WO CONTRAST   MR  Lumbar Spine W Wo Contrast   Comp Spinocerebellar Ataxia   Autoimmune Neurology Ab   Return for pending if symptoms worsen or fail to improve, pending test results.    Suanne Marker, MD 09/12/2023, 5:02 PM Certified in Neurology, Neurophysiology and Neuroimaging  [facility] [address], [address] [place], [place] [address] [phone]

## 2023-09-13 ENCOUNTER — Other Ambulatory Visit (INDEPENDENT_AMBULATORY_CARE_PROVIDER_SITE_OTHER): Payer: Self-pay

## 2023-09-13 DIAGNOSIS — Z0289 Encounter for other administrative examinations: Secondary | ICD-10-CM

## 2023-09-14 ENCOUNTER — Other Ambulatory Visit: Payer: BC Managed Care – PPO

## 2023-09-14 DIAGNOSIS — E2740 Unspecified adrenocortical insufficiency: Secondary | ICD-10-CM | POA: Diagnosis not present

## 2023-09-20 ENCOUNTER — Ambulatory Visit (INDEPENDENT_AMBULATORY_CARE_PROVIDER_SITE_OTHER): Payer: BC Managed Care – PPO | Admitting: "Endocrinology

## 2023-09-20 ENCOUNTER — Encounter: Payer: Self-pay | Admitting: "Endocrinology

## 2023-09-20 VITALS — BP 110/68 | HR 63 | Ht 66.0 in | Wt 136.8 lb

## 2023-09-20 DIAGNOSIS — R7989 Other specified abnormal findings of blood chemistry: Secondary | ICD-10-CM | POA: Diagnosis not present

## 2023-09-20 NOTE — Progress Notes (Signed)
Outpatient Endocrinology Note Kathryn Morrow, MD    Kathryn Park 1986-04-30 [id number]  Referring Provider: Imogene Burn, MD Primary Care Provider: [name], PA Reason for consultation: Subjective   Assessment & Plan  Kathryn Park was seen today for establish care.  Diagnoses and all orders for this visit:  Low serum cortisol level -     TSH  Patient had a low cortisol of 2.5 around 11 AM on 06/14/2023, followed by normal cortisol at 10.4 at 8:46 AM on 09/14/2023.  Patient denies taking any steroids around the time of the first cortisol that was low.  Her current ACTH and DHEA-sulfate are normal.  Her thyroid labs (TSH, free T4) were normal last year. Patient reports some abdominal pain, has morning nausea, has complex medical history involving dysautonomia, ruling out ataxia by neurologist, Ehlers-Danlos syndrome and history of ovarian cancer Will follow-up with cosyntropin stimulation test Will also repeat TSH   Return for cosyntropin stimulation test followed by video visit in 1 week .   I have reviewed current medications, nurse's notes, allergies, vital signs, past medical and surgical history, family medical history, and social history for this encounter. Counseled patient on symptoms, examination findings, lab findings, imaging results, treatment decisions and monitoring and prognosis. The patient understood the recommendations and agrees with the treatment plan. All questions regarding treatment plan were fully answered.  Kathryn Clymer, MD  09/20/23   History of Present Illness HPI  Kathryn Park is a 37 y.o. female  referred by [name] for evaluation and management of "low cortisol".   Patient was seen by GI due to morning nausea  Sees a neurologist, ruling out ataxia due to gait issue, has a cane that she sues outside the house   Sees a cardiologist for dysautonomia   Has history of ovarian cancer s/p ovarian  removal on one side  Has Ehlers danlos syndrome   She weight change Yes, some, stable last year  moon face No fat pads No increased girth No plethora No hyperpigmentation No, no HTN purple striae No proximal muscle weakness No acne No vellus/terminal hirsutism No scalp hair loss No a history of HTN a history of hypokalemia No paroxysmal episodes of anxiety Yes, remote history of panic attack  tremors Yes lightheadedness Yes headache Yes palpitation No sweating Yes blurry vision Yes  Component     Latest Ref Rng 09/24/2022 06/14/2023 09/14/2023  T4,Free(Direct)     0.82 - 1.77 ng/dL 4.78     Cortisol, Plasma     mcg/dL  2.5  29.5   DHEA-SO4     19 - 237 mcg/dL   74   A213 ACTH     6 - 50 pg/mL   19     Physical Exam  BP 110/68 (BP Location: Left Arm, Patient Position: Sitting, Cuff Size: Small)   Pulse 63   Ht 5\' 6"  (1.676 m)   Wt 136 lb 12.8 oz (62.1 kg)   SpO2 99%   BMI 22.08 kg/m    Constitutional: well developed, well nourished Head: normocephalic, atraumatic Eyes: sclera anicteric, no redness Neck: supple Lungs: normal respiratory effort Neurology: alert and oriented Skin: dry, no appreciable rashes Musculoskeletal: no appreciable defects Psychiatric: normal mood and affect   Current Medications Patient's Medications  New Prescriptions   No medications on file  Previous Medications   ALBUTEROL (VENTOLIN HFA) 108 (90 BASE) MCG/ACT INHALER    INHALE 2 PUFFS BY MOUTH EVERY 4 HOURS AS  NEEDED FOR WHEEZING OR SHORTNESS OF BREATH (COUGHING FITS)   BECLOMETHASONE (QVAR) 80 MCG/ACT INHALER    Inhale 2 puffs into the lungs 2 (two) times daily. Rinse mouth after each use.   BUSPIRONE (BUSPAR) 5 MG TABLET    Take 1 tablet (5 mg total) by mouth 3 (three) times daily as needed (anxiety).   CYANOCOBALAMIN 1000 MCG TABLET    Take 1,000 mcg by mouth daily.   EPINEPHRINE 0.3 MG/0.3 ML IJ SOAJ INJECTION    Inject 0.3 mg into the muscle as needed for anaphylaxis.    ESCITALOPRAM (LEXAPRO) 20 MG TABLET    Take 1/2 tablet (10mg ) tablet by mouth daily for 2 weeks out of cycle   FAMOTIDINE (PEPCID) 20 MG TABLET    Take 1 tablet (20 mg total) by mouth 2 (two) times daily.   FEXOFENADINE (ALLEGRA) 180 MG TABLET    Take 180 mg by mouth in the morning and at bedtime.   MAGNESIUM OXIDE (MAG-OX) 400 (240 MG) MG TABLET    Take 400 mg by mouth daily.   MELATONIN 10 MG TABS    Take 1 tablet by mouth at bedtime.   MULTIPLE VITAMINS-MINERALS (MULTIPLE VITAMINS/WOMENS PO)    Take 1 tablet by mouth daily in the afternoon.   OMEPRAZOLE (PRILOSEC) 40 MG CAPSULE    Take 1 capsule (40 mg total) by mouth daily.   ONDANSETRON (ZOFRAN) 4 MG TABLET    Take 1 tablet (4 mg total) by mouth 3 (three) times daily as needed for nausea or vomiting.   TURMERIC 500 MG CAPS    Take 1 capsule by mouth daily in the afternoon.   VALACYCLOVIR (VALTREX) 1000 MG TABLET    Take 2 tablets (2000 mg) by mouth twice a day for 24 hours as needed for cold sore.  Modified Medications   No medications on file  Discontinued Medications   No medications on file    Allergies Allergies  Allergen Reactions   Shellfish Allergy Anaphylaxis    Throat closes   Other Other (See Comments)    Watermelon, mold/mildew, pet dander, dust and dust mites  Swelling of eyes and congestion   Latex Rash   Sulfa Antibiotics Rash    Past Medical History Past Medical History:  Diagnosis Date   Abnormal Pap smear of cervix    Allergy    Anemia    suspected poor diet; vegetarian during teens   Anxiety    Asthma    childhood   Depression    Dysmenorrhea    Ehlers-Danlos syndrome    Family history of breast cancer    Family history of kidney cancer    Granulosa cell tumor of ovary    History of iron deficiency anemia    age 52   Joint pain    Labral tear of right hip joint    Leukocytosis    Low vitamin D level    Migraine with aura    never on rx or saw neurology   Ovarian cancer (HCC) 2021   Ovarian  cyst    Left   Pneumonia 01/2012    left lower lobe   Pre-diabetes 2015   had a "borderline" A1c   STD (sexually transmitted disease)    HSV1   Wears contact lenses    Wears glasses     Past Surgical History Past Surgical History:  Procedure Laterality Date   ADENOIDECTOMY     BREAST BIOPSY Right 04/2020   benign   COLPOSCOPY  LAPAROSCOPIC OVARIAN CYSTECTOMY Left 11/13/2019   Procedure: LAPAROSCOPIC OVARIAN CYSTECTOMY/POSSIBLE LEFT OOPHORECTOMY/ COLLECTION OF PELVIC WASHINGS/ks;  Surgeon: Patton Salles, MD;  Location: [facility];  Service: Gynecology;  Laterality: Left;  Possible left oophorectomy Colection of pelvic washings To follow first case of the day   LEFT OOPHORECTOMY Left 11/2019   right hip arthroscopy  01/2022   for laberal tear repair   TONSILLECTOMY     UPPER GI ENDOSCOPY      Family History family history includes Allergic rhinitis in her brother, father, and mother; Asthma in her mother; Breast cancer in her maternal grandmother; Cancer in an other family member; Colon polyps in her mother; Diabetes in her mother; Diverticulitis in her mother; Eczema in her brother and father; Factor V Leiden deficiency in her mother; Hypertension in her mother; Kidney cancer in her father; Other in her brother and mother; Rheum arthritis in her maternal great-grandmother; Stroke in her maternal grandfather and mother; Ulcerative colitis in her father.  Social History Social History   Socioeconomic History   Marital status: Married    Spouse name: Not on file   Number of children: 0   Years of education: Not on file   Highest education level: Not on file  Occupational History   Not on file  Tobacco Use   Smoking status: Former    Current packs/day: 1.00    Types: Cigarettes    Passive exposure: Past   Smokeless tobacco: Never  Vaping Use   Vaping status: Never Used  Substance and Sexual Activity   Alcohol use: Not Currently   Drug  use: No   Sexual activity: Yes    Partners: Male    Birth control/protection: Surgical, Pill    Comment: vasectomy  Other Topics Concern   Not on file  Social History Narrative   Moved from [place]   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   Social Drivers of Health   Financial Resource Strain: Not on file  Food Insecurity: Not on file  Transportation Needs: Not on file  Physical Activity: Not on file  Stress: Not on file  Social Connections: Not on file  Intimate Partner Violence: Not on file    Lab Results  Component Value Date   CHOL 142 10/11/2019   Lab Results  Component Value Date   HDL 64 10/11/2019   Lab Results  Component Value Date   LDLCALC 70 10/11/2019   Lab Results  Component Value Date   TRIG 29 10/11/2019   Lab Results  Component Value Date   CHOLHDL 2.2 10/11/2019   Lab Results  Component Value Date   CREATININE 0.66 09/29/2022   Lab Results  Component Value Date   GFR 107.62 12/23/2021      Component Value Date/Time   NA 139 09/29/2022 1127   K 4.2 09/29/2022 1127   CL 101 09/29/2022 1127   CO2 24 09/29/2022 1127   GLUCOSE 90 09/29/2022 1127   GLUCOSE 82 12/23/2021 1507   BUN 13 09/29/2022 1127   CREATININE 0.66 09/29/2022 1127   CREATININE 0.75 09/04/2020 1150   CALCIUM 9.8 09/29/2022 1127   PROT 7.3 09/29/2022 1127   ALBUMIN 4.8 09/29/2022 1127   AST 21 09/29/2022 1127   AST 11 (L) 09/04/2020 1150   ALT 13 09/29/2022 1127   ALT 8 09/04/2020 1150   ALKPHOS 60 09/29/2022 1127   BILITOT 0.2 09/29/2022 1127   BILITOT 0.3  09/04/2020 1150   GFRNONAA >60 09/04/2020 1150   GFRAA 106 10/11/2019 1013      Latest Ref Rng & Units 09/29/2022   11:27 AM 12/23/2021    3:07 PM 07/06/2021    9:18 AM  BMP  Glucose 70 - 99 mg/dL 90  82  80   BUN 6 - 20 mg/dL 13  9  10    Creatinine 0.57 - 1.00 mg/dL 5.62  1.30  8.65   BUN/Creat Ratio 9 - 23 20     Sodium 134 - 144 mmol/L 139  141  140   Potassium 3.5 -  5.2 mmol/L 4.2  4.0  4.0   Chloride 96 - 106 mmol/L 101  104  105   CO2 20 - 29 mmol/L 24  31  27    Calcium 8.7 - 10.2 mg/dL 9.8  9.0  9.3        Component Value Date/Time   WBC 8.3 09/29/2022 1127   WBC 9.4 02/23/2022 1612   RBC 4.79 09/29/2022 1127   RBC 4.55 02/23/2022 1612   HGB 13.9 09/29/2022 1127   HCT 42.3 09/29/2022 1127   PLT 256 09/29/2022 1127   MCV 88 09/29/2022 1127   MCH 29.0 09/29/2022 1127   MCH 29.0 09/04/2020 1150   MCH 29.5 09/04/2020 1150   MCHC 32.9 09/29/2022 1127   MCHC 33.7 02/23/2022 1612   RDW 12.2 09/29/2022 1127   LYMPHSABS 3.5 (H) 09/29/2022 1127   MONOABS 0.7 02/23/2022 1612   EOSABS 0.2 09/29/2022 1127   BASOSABS 0.1 09/29/2022 1127   Lab Results  Component Value Date   TSH 1.490 09/24/2022   TSH 3.80 07/06/2021   TSH 2.500 09/04/2020   FREET4 1.30 09/24/2022         Parts of this note may have been dictated using voice recognition software. There may be variances in spelling and vocabulary which are unintentional. Not all errors are proofread. Please notify the Thereasa Parkin if any discrepancies are noted or if the meaning of any statement is not clear.

## 2023-09-21 ENCOUNTER — Ambulatory Visit: Payer: BC Managed Care – PPO

## 2023-09-21 DIAGNOSIS — R270 Ataxia, unspecified: Secondary | ICD-10-CM

## 2023-09-21 MED ORDER — GADOBENATE DIMEGLUMINE 529 MG/ML IV SOLN
10.0000 mL | Freq: Once | INTRAVENOUS | Status: AC | PRN
  Administered 2023-09-21: 10 mL via INTRAVENOUS

## 2023-09-22 DIAGNOSIS — F321 Major depressive disorder, single episode, moderate: Secondary | ICD-10-CM | POA: Diagnosis not present

## 2023-09-23 LAB — ALDOSTERONE + RENIN ACTIVITY W/ RATIO
ALDO / PRA Ratio: 5.3 {ratio} (ref 0.9–28.9)
Aldosterone: 3 ng/dL
Renin Activity: 0.57 ng/mL/h (ref 0.25–5.82)

## 2023-09-23 LAB — CORTISOL: Cortisol, Plasma: 10.4 ug/dL

## 2023-09-23 LAB — DHEA-SULFATE: DHEA-SO4: 74 ug/dL (ref 19–237)

## 2023-09-23 LAB — METANEPHRINES, PLASMA
Metanephrine, Free: 32 pg/mL (ref <=57)
Normetanephrine, Free: 74 pg/mL (ref <=148)
Total Metanephrines-Plasma: 106 pg/mL (ref <=205)

## 2023-09-23 LAB — ACTH: C206 ACTH: 19 pg/mL (ref 6–50)

## 2023-09-26 ENCOUNTER — Other Ambulatory Visit: Payer: BC Managed Care – PPO

## 2023-10-03 ENCOUNTER — Other Ambulatory Visit: Payer: Self-pay

## 2023-10-04 ENCOUNTER — Telehealth: Payer: Self-pay

## 2023-10-04 ENCOUNTER — Other Ambulatory Visit: Payer: Self-pay | Admitting: "Endocrinology

## 2023-10-04 ENCOUNTER — Ambulatory Visit (INDEPENDENT_AMBULATORY_CARE_PROVIDER_SITE_OTHER): Payer: BC Managed Care – PPO

## 2023-10-04 ENCOUNTER — Other Ambulatory Visit: Payer: BC Managed Care – PPO

## 2023-10-04 DIAGNOSIS — E2740 Unspecified adrenocortical insufficiency: Secondary | ICD-10-CM

## 2023-10-04 DIAGNOSIS — R7989 Other specified abnormal findings of blood chemistry: Secondary | ICD-10-CM

## 2023-10-04 MED ORDER — COSYNTROPIN 0.25 MG IJ SOLR
0.2500 mg | Freq: Once | INTRAMUSCULAR | Status: AC
Start: 2023-10-04 — End: 2023-10-04
  Administered 2023-10-04: 0.25 mg via INTRAVENOUS

## 2023-10-04 NOTE — Telephone Encounter (Signed)
Orders Placed This Encounter  Procedures   Cortisol   Cortisol   Cortisol

## 2023-10-04 NOTE — Progress Notes (Signed)
 After obtaining consent, and per orders of Dr. Roosevelt Locks, injection of Cosyntropin given by Pollie Meyer. Patient instructed to remain in clinic for the remaining of her labs and to report any adverse reaction to me immediately.

## 2023-10-05 LAB — TSH: TSH: 0.85 m[IU]/L

## 2023-10-06 LAB — ACTH STIMULATION, 3 SPECIMENS
Coritsol: 6.1 ug/dL
Cortisol: 27.2 ug/dL
Cortisol: 31 ug/dL
TIME 1: 840
TIME 3: 940
Time 2: 910

## 2023-10-11 ENCOUNTER — Encounter: Payer: Self-pay | Admitting: "Endocrinology

## 2023-10-11 ENCOUNTER — Telehealth (INDEPENDENT_AMBULATORY_CARE_PROVIDER_SITE_OTHER): Payer: BC Managed Care – PPO | Admitting: "Endocrinology

## 2023-10-11 VITALS — Ht 66.0 in

## 2023-10-11 DIAGNOSIS — R110 Nausea: Secondary | ICD-10-CM

## 2023-10-11 DIAGNOSIS — R7989 Other specified abnormal findings of blood chemistry: Secondary | ICD-10-CM

## 2023-10-11 NOTE — Progress Notes (Signed)
 The patient reports they are currently: Kathryn Park. I spent 6-7 minutes on the video with the patient on the date of service. I spent an additional 5-10 minutes on pre- and post-visit activities on the date of service.   The patient was physically located in [place]  or a state in which I am permitted to provide care. The patient and/or parent/guardian understood that s/he may incur co-pays and cost sharing, and agreed to the telemedicine visit. The visit was reasonable and appropriate under the circumstances given the patient's presentation at the time.  The patient and/or parent/guardian has been advised of the potential risks and limitations of this mode of treatment (including, but not limited to, the absence of in-person examination) and has agreed to be treated using telemedicine. The patient's/patient's family's questions regarding telemedicine have been answered.   The patient and/or parent/guardian has also been advised to contact their provider's office for worsening conditions, and seek emergency medical treatment and/or call 911 if the patient deems either necessary.     Outpatient Endocrinology Note Kathryn Birmingham, MD    Female Iafrate 1936-04-26 [id number]  Referring Provider: Job Lukes, PA Primary Care Provider: Job Lukes, PA Reason for consultation: Subjective   Assessment & Plan  Aerilynn was seen today for follow-up.  Diagnoses and all orders for this visit:  Low serum cortisol level  Nausea   Patient had a low cortisol of 2.5 around 11 AM on 06/14/2023, followed by normal cortisol at 10.4 at 8:46 AM on 09/14/2023.  Patient denies taking any steroids around the time of the first cortisol that was low.  Her current ACTH  and DHEA-sulfate are normal.  Her thyroid  labs (TSH, free T4) were normal in 2023. Patient reported some abdominal pain, has morning nausea, has complex medical history involving dysautonomia, ruling out ataxia by  neurologist, Ehlers-Danlos syndrome and history of ovarian cancer Cosyntropin  stimulation test result was WNL Repeat TSH was WNL Discussed ways to improve nausea  Follow up as needed   No follow-ups on file.   I have reviewed current medications, nurse's notes, allergies, vital signs, past medical and surgical history, family medical history, and social history for this encounter. Counseled patient on symptoms, examination findings, lab findings, imaging results, treatment decisions and monitoring and prognosis. The patient understood the recommendations and agrees with the treatment plan. All questions regarding treatment plan were fully answered.  Kathryn Birmingham, MD  10/11/23   History of Present Illness HPI  Azalyn Park is a 38 y.o. female  referred by Dr. Job for evaluation and management of low cortisol.   Continues to have morning nausea and gait issues Uses a cane almost daily, trying to get in with a therapist   10/04/23 Cosyntropin  stimulation test  Component Ref Range & Units (hover) 7 d ago  TIME 1 840  Coritsol 6.1  Time 2 910  Cortisol 27.2  TIME 3 940  Cortisol 31.0    Initial history:  Patient was seen by GI due to morning nausea  Sees a neurologist, ruling out ataxia due to gait issue, has a cane that she sues outside the house   Sees a cardiologist for dysautonomia   Has history of ovarian cancer s/p ovarian removal on one side  Has Ehlers danlos syndrome   She weight change Yes, some, stable last year  moon face No fat pads No increased girth No plethora No hyperpigmentation No, no HTN purple striae No proximal muscle weakness No acne No vellus/terminal hirsutism No  scalp hair loss No a history of HTN a history of hypokalemia No paroxysmal episodes of anxiety Yes, remote history of panic attack  tremors Yes lightheadedness Yes headache Yes palpitation No sweating Yes blurry vision Yes  Component     Latest Ref  Rng 09/24/2022 06/14/2023 09/14/2023  T4,Free(Direct)     0.82 - 1.77 ng/dL 8.69     Cortisol, Plasma     mcg/dL  2.5  89.5   DHEA-SO4     19 - 237 mcg/dL   74   R793 ACTH      6 - 50 pg/mL   19     Physical Exam  Ht 5' 6 (1.676 m)   BMI 22.08 kg/m    Constitutional: well developed, well nourished Head: normocephalic, atraumatic Eyes: sclera anicteric, no redness Neck: supple Lungs: normal respiratory effort Neurology: alert and oriented Skin: dry, no appreciable rashes Musculoskeletal: no appreciable defects Psychiatric: normal mood and affect   Current Medications Patient's Medications  New Prescriptions   No medications on file  Previous Medications   ALBUTEROL  (VENTOLIN  HFA) 108 (90 BASE) MCG/ACT INHALER    INHALE 2 PUFFS BY MOUTH EVERY 4 HOURS AS NEEDED FOR WHEEZING OR SHORTNESS OF BREATH (COUGHING FITS)   BECLOMETHASONE (QVAR) 80 MCG/ACT INHALER    Inhale 2 puffs into the lungs 2 (two) times daily. Rinse mouth after each use.   BUSPIRONE  (BUSPAR ) 5 MG TABLET    Take 1 tablet (5 mg total) by mouth 3 (three) times daily as needed (anxiety).   CYANOCOBALAMIN  1000 MCG TABLET    Take 1,000 mcg by mouth daily.   EPINEPHRINE  0.3 MG/0.3 ML IJ SOAJ INJECTION    Inject 0.3 mg into the muscle as needed for anaphylaxis.   ESCITALOPRAM  (LEXAPRO ) 20 MG TABLET    Take 1/2 tablet (10mg ) tablet by mouth daily for 2 weeks out of cycle   FAMOTIDINE  (PEPCID ) 20 MG TABLET    Take 1 tablet (20 mg total) by mouth 2 (two) times daily.   FEXOFENADINE (ALLEGRA) 180 MG TABLET    Take 180 mg by mouth in the morning and at bedtime.   MAGNESIUM OXIDE (MAG-OX) 400 (240 MG) MG TABLET    Take 400 mg by mouth daily.   MELATONIN 10 MG TABS    Take 1 tablet by mouth at bedtime.   MULTIPLE VITAMINS-MINERALS (MULTIPLE VITAMINS/WOMENS PO)    Take 1 tablet by mouth daily in the afternoon.   OMEPRAZOLE  (PRILOSEC) 40 MG CAPSULE    Take 1 capsule (40 mg total) by mouth daily.   ONDANSETRON  (ZOFRAN ) 4 MG TABLET     Take 1 tablet (4 mg total) by mouth 3 (three) times daily as needed for nausea or vomiting.   TURMERIC 500 MG CAPS    Take 1 capsule by mouth daily in the afternoon.   VALACYCLOVIR  (VALTREX ) 1000 MG TABLET    Take 2 tablets (2000 mg) by mouth twice a day for 24 hours as needed for cold sore.  Modified Medications   No medications on file  Discontinued Medications   No medications on file    Allergies Allergies  Allergen Reactions   Shellfish Allergy Anaphylaxis    Throat closes   Other Other (See Comments)    Watermelon, mold/mildew, pet dander, dust and dust mites  Swelling of eyes and congestion   Latex Rash   Sulfa Antibiotics Rash    Past Medical History Past Medical History:  Diagnosis Date   Abnormal Pap smear of cervix  Allergy    Anemia    suspected poor diet; vegetarian during teens   Anxiety    Asthma    childhood   Depression    Dysmenorrhea    Ehlers-Danlos syndrome    Family history of breast cancer    Family history of kidney cancer    Granulosa cell tumor of ovary    History of iron deficiency anemia    age 80   Joint pain    Labral tear of right hip joint    Leukocytosis    Low vitamin D  level    Migraine with aura    never on rx or saw neurology   Ovarian cancer (HCC) 2021   Ovarian cyst    Left   Pneumonia 01/2012    left lower lobe   Pre-diabetes 2015   had a borderline A1c   STD (sexually transmitted disease)    HSV1   Wears contact lenses    Wears glasses     Past Surgical History Past Surgical History:  Procedure Laterality Date   ADENOIDECTOMY     BREAST BIOPSY Right 04/2020   benign   COLPOSCOPY     LAPAROSCOPIC OVARIAN CYSTECTOMY Left 11/13/2019   Procedure: LAPAROSCOPIC OVARIAN CYSTECTOMY/POSSIBLE LEFT OOPHORECTOMY/ COLLECTION OF PELVIC WASHINGS/ks;  Surgeon: Cathlyn JAYSON Nikki Bobie FORBES, MD;  Location: [facility];  Service: Gynecology;  Laterality: Left;  Possible left oophorectomy Colection of pelvic  washings To follow first case of the day   LEFT OOPHORECTOMY Left 11/2019   right hip arthroscopy  01/2022   for laberal tear repair   TONSILLECTOMY     UPPER GI ENDOSCOPY      Family History family history includes Allergic rhinitis in her brother, father, and mother; Asthma in her mother; Breast cancer in her maternal grandmother; Cancer in an other family member; Colon polyps in her mother; Diabetes in her mother; Diverticulitis in her mother; Eczema in her brother and father; Factor V Leiden deficiency in her mother; Hypertension in her mother; Kidney cancer in her father; Other in her brother and mother; Rheum arthritis in her maternal great-grandmother; Stroke in her maternal grandfather and mother; Ulcerative colitis in her father.  Social History Social History   Socioeconomic History   Marital status: Married    Spouse name: Not on file   Number of children: 0   Years of education: Not on file   Highest education level: Not on file  Occupational History   Not on file  Tobacco Use   Smoking status: Former    Current packs/day: 1.00    Types: Cigarettes    Passive exposure: Past   Smokeless tobacco: Never  Vaping Use   Vaping status: Never Used  Substance and Sexual Activity   Alcohol use: Not Currently   Drug use: No   Sexual activity: Yes    Partners: Male    Birth control/protection: Surgical, Pill    Comment: vasectomy  Other Topics Concern   Not on file  Social History Narrative   Moved from [place]   Into consulting civil engineer   Married   Working part-time in engineering geologist   Social Drivers of Corporate Investment Banker Strain: Not on file  Food Insecurity: Not on file  Transportation Needs: Not on file  Physical Activity: Not on file  Stress: Not on file  Social Connections: Not on file  Intimate Partner Violence: Not on file    Lab Results  Component Value Date  CHOL 142 10/11/2019   Lab Results  Component Value Date   HDL 64 10/11/2019    Lab Results  Component Value Date   LDLCALC 70 10/11/2019   Lab Results  Component Value Date   TRIG 29 10/11/2019   Lab Results  Component Value Date   CHOLHDL 2.2 10/11/2019   Lab Results  Component Value Date   CREATININE 0.66 09/29/2022   Lab Results  Component Value Date   GFR 107.62 12/23/2021      Component Value Date/Time   NA 139 09/29/2022 1127   K 4.2 09/29/2022 1127   CL 101 09/29/2022 1127   CO2 24 09/29/2022 1127   GLUCOSE 90 09/29/2022 1127   GLUCOSE 82 12/23/2021 1507   BUN 13 09/29/2022 1127   CREATININE 0.66 09/29/2022 1127   CREATININE 0.75 09/04/2020 1150   CALCIUM 9.8 09/29/2022 1127   PROT 7.3 09/29/2022 1127   ALBUMIN 4.8 09/29/2022 1127   AST 21 09/29/2022 1127   AST 11 (L) 09/04/2020 1150   ALT 13 09/29/2022 1127   ALT 8 09/04/2020 1150   ALKPHOS 60 09/29/2022 1127   BILITOT 0.2 09/29/2022 1127   BILITOT 0.3 09/04/2020 1150   GFRNONAA >60 09/04/2020 1150   GFRAA 106 10/11/2019 1013      Latest Ref Rng & Units 09/29/2022   11:27 AM 12/23/2021    3:07 PM 07/06/2021    9:18 AM  BMP  Glucose 70 - 99 mg/dL 90  82  80   BUN 6 - 20 mg/dL 13  9  10    Creatinine 0.57 - 1.00 mg/dL 9.33  9.27  9.26   BUN/Creat Ratio 9 - 23 20     Sodium 134 - 144 mmol/L 139  141  140   Potassium 3.5 - 5.2 mmol/L 4.2  4.0  4.0   Chloride 96 - 106 mmol/L 101  104  105   CO2 20 - 29 mmol/L 24  31  27    Calcium 8.7 - 10.2 mg/dL 9.8  9.0  9.3        Component Value Date/Time   WBC 8.3 09/29/2022 1127   WBC 9.4 02/23/2022 1612   RBC 4.79 09/29/2022 1127   RBC 4.55 02/23/2022 1612   HGB 13.9 09/29/2022 1127   HCT 42.3 09/29/2022 1127   PLT 256 09/29/2022 1127   MCV 88 09/29/2022 1127   MCH 29.0 09/29/2022 1127   MCH 29.0 09/04/2020 1150   MCH 29.5 09/04/2020 1150   MCHC 32.9 09/29/2022 1127   MCHC 33.7 02/23/2022 1612   RDW 12.2 09/29/2022 1127   LYMPHSABS 3.5 (H) 09/29/2022 1127   MONOABS 0.7 02/23/2022 1612   EOSABS 0.2 09/29/2022 1127    BASOSABS 0.1 09/29/2022 1127   Lab Results  Component Value Date   TSH 0.85 10/04/2023   TSH 1.490 09/24/2022   TSH 3.80 07/06/2021   FREET4 1.30 09/24/2022         Parts of this note may have been dictated using voice recognition software. There may be variances in spelling and vocabulary which are unintentional. Not all errors are proofread. Please notify the dino if any discrepancies are noted or if the meaning of any statement is not clear.

## 2023-10-12 LAB — COMP SPINOCEREBELLAR ATAXIA

## 2023-10-12 LAB — AUTOIMMUNE NEUROLOGY AB

## 2023-10-20 ENCOUNTER — Telehealth: Payer: Self-pay | Admitting: *Deleted

## 2023-10-20 NOTE — Telephone Encounter (Signed)
Spoke to pt gave lab wok results and informed pt SCA panel is pending . Pt expressed understanding and thanked me for calling

## 2023-10-20 NOTE — Telephone Encounter (Signed)
-----   Message from [facility] sent at 10/19/2023  3:06 PM EST ----- Normal autoimmune panel. SCA panel pending. -VRP

## 2023-10-26 NOTE — Progress Notes (Signed)
Normal scan. -VRP

## 2023-10-27 NOTE — Progress Notes (Signed)
   I, Stevenson Clinch, CMA acting as a scribe for Kathryn Graham, MD.  Kathryn Park is a 38 y.o. female who presents to [facility] today for exacerbation of her R shoulder pain. Pt was last seen by Dr. Denyse Amass on 06/08/23 and was referred to PT.  Today, pt reports some worsening right shoulder sx. Tearing sensation when dressing in the morning. Also s/o left-sided rib pain since 10/04/23. Sx started at the right clavicle radiating across the chest to the left ribs. Sx worse with deep breathing. Sx have improved some since onset. Has dx of EDS.     Pertinent review of systems: No fevers or chills  Relevant historical information: Ehlers-Danlos syndrome and truncal instability/ataxia   Exam:  BP 120/78   Pulse 97   Ht 5\' 6"  (1.676 m)   Wt 137 lb (62.1 kg)   SpO2 (!) 89%   BMI 22.11 kg/m  General: Well Developed, well nourished, and in no acute distress.   MSK: Right shoulder normal-appearing normal motion.  Some popping present with shoulder abduction.      Assessment and Plan: 38 y.o. female with chronic right shoulder pain and instability due to hypermobility syndrome.  Plan to refer to PT.  There was a problem get around previously.  Physical therapy will be helpful prior to any other intervention.  Next step if needed would be MRI arthrogram of the shoulder.  Rib pain exacerbation of a chronic issue.  She has had a good workup before already for this with x-rays and subsequently an MRI of her T-spine.  Again PT.   PDMP not reviewed this encounter. Orders Placed This Encounter  Procedures   Ambulatory referral to Physical Therapy    Referral Priority:   Routine    Referral Type:   Physical Medicine    Referral Reason:   Specialty Services Required    Requested Specialty:   Physical Therapy    Number of Visits Requested:   1   No orders of the defined types were placed in this encounter.    Discussed warning signs or  symptoms. Please see discharge instructions. Patient expresses understanding.   The above documentation has been reviewed and is accurate and complete Kathryn Park, M.D.

## 2023-10-28 ENCOUNTER — Ambulatory Visit (INDEPENDENT_AMBULATORY_CARE_PROVIDER_SITE_OTHER): Payer: Self-pay | Admitting: Family Medicine

## 2023-10-28 ENCOUNTER — Encounter: Payer: Self-pay | Admitting: Family Medicine

## 2023-10-28 VITALS — BP 120/78 | HR 97 | Ht 66.0 in | Wt 137.0 lb

## 2023-10-28 DIAGNOSIS — R0781 Pleurodynia: Secondary | ICD-10-CM

## 2023-10-28 DIAGNOSIS — G8929 Other chronic pain: Secondary | ICD-10-CM

## 2023-10-28 DIAGNOSIS — M25511 Pain in right shoulder: Secondary | ICD-10-CM

## 2023-10-28 NOTE — Patient Instructions (Signed)
Thank you for coming in today.   Proceed to physical therapy for both the ribs and the shoulder.   We can do a MRI arthrogram if needed.

## 2023-11-07 ENCOUNTER — Encounter: Payer: Self-pay | Admitting: Obstetrics and Gynecology

## 2023-11-07 ENCOUNTER — Ambulatory Visit: Payer: No Typology Code available for payment source | Admitting: Obstetrics and Gynecology

## 2023-11-07 ENCOUNTER — Telehealth: Payer: Self-pay | Admitting: Obstetrics and Gynecology

## 2023-11-07 VITALS — BP 134/82 | HR 87 | Ht 66.0 in | Wt 134.0 lb

## 2023-11-07 DIAGNOSIS — N6315 Unspecified lump in the right breast, overlapping quadrants: Secondary | ICD-10-CM

## 2023-11-07 DIAGNOSIS — N6310 Unspecified lump in the right breast, unspecified quadrant: Secondary | ICD-10-CM

## 2023-11-07 NOTE — Progress Notes (Signed)
GYNECOLOGY  VISIT   HPI: 38 y.o.   Married  Caucasian female   [id number] with Patient's last menstrual period was 10/18/2023 (approximate).   here for: lump in R breast- has previously been looked at, still feels slightly tender. Pt reports skin indentation right above lump and change in shape.  Notices this for a couple of months.  The area on her right breast feels more firm and has some skin indentation above this area.   Has breast tenderness that comes and goes in her breasts, more in this area.   Hx right breast biopsy on 04/17/20 at 6:30 showing fibroadenoma.   Stopped taking birth control pills.  Periods are less heavy.   Will see GYN ONC in May for hx granulosa cell tumor of ovary.  Will go to [facility] for evaluation of ataxia.    GYNECOLOGIC HISTORY: Patient's last menstrual period was 10/18/2023 (approximate). Contraception:  vasectomy Menopausal hormone therapy:  n/a Last 2 paps:  08/31/22 neg: HR HPV neg History of abnormal Pap or positive HPV:  yes, Hx of colposcopy 2007. No treatment  Mammogram:  06/09/23 Breast Density Cat D, BI-RADS CAT 2 benign.  Due in May, 2025 for bilateral screening mammogram.        OB History     Gravida  0   Para  0   Term  0   Preterm  0   AB  0   Living  0      SAB  0   IAB  0   Ectopic  0   Multiple  0   Live Births  0              Patient Active Problem List   Diagnosis Date Noted   Dysautonomia (HCC) 03/15/2023   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 09/29/2022   Other adverse food reactions, not elsewhere classified, subsequent encounter 09/29/2022   Other allergic rhinitis 09/29/2022   Rash and other nonspecific skin eruption 09/29/2022   Pruritus 09/29/2022   Mild intermittent asthma without complication 09/29/2022   Gastroesophageal reflux disease 09/29/2022   EDS (Ehlers-Danlos syndrome) 06/10/2022   Tear of right acetabular labrum 07/22/2021   Breast lump 02/12/2021   Bilateral hip pain 11/25/2020    Positive ANA (antinuclear antibody) 11/25/2020   Other fatigue 11/25/2020   Bladder retention 10/13/2020   HSV-1 (herpes simplex virus 1) infection 10/13/2020   History of anorexia nervosa 10/13/2020   GAD (generalized anxiety disorder) 10/13/2020   Family history of breast cancer    Family history of kidney cancer    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    Past Medical History:  Diagnosis Date   Abnormal Pap smear of cervix    Allergy    Anemia    suspected poor diet; vegetarian during teens   Anxiety    Asthma    childhood   Depression    Dysmenorrhea    Ehlers-Danlos syndrome    Family history of breast cancer    Family history of kidney cancer    Granulosa cell tumor of ovary    History of iron deficiency anemia    age 52   Joint pain    Labral tear of right hip joint    Leukocytosis    Low vitamin D level    Migraine with aura    never on rx or saw neurology   Ovarian cancer (HCC) 2021   Ovarian cyst    Left   Pneumonia 01/2012  left lower lobe   Pre-diabetes 2015   had a "borderline" A1c   STD (sexually transmitted disease)    HSV1   Wears contact lenses    Wears glasses     Past Surgical History:  Procedure Laterality Date   ADENOIDECTOMY     BREAST BIOPSY Right 04/2020   benign fibroadenoma at 6:30   COLPOSCOPY     LAPAROSCOPIC OVARIAN CYSTECTOMY Left 11/13/2019   Procedure: LAPAROSCOPIC OVARIAN CYSTECTOMY/POSSIBLE LEFT OOPHORECTOMY/ COLLECTION OF PELVIC WASHINGS/ks;  Surgeon: Patton Salles, MD;  Location: [facility];  Service: Gynecology;  Laterality: Left;  Possible left oophorectomy Colection of pelvic washings To follow first case of the day   LEFT OOPHORECTOMY Left 11/2019   right hip arthroscopy  01/2022   for laberal tear repair   TONSILLECTOMY     UPPER GI ENDOSCOPY      Current Outpatient Medications  Medication Sig Dispense Refill   albuterol (VENTOLIN HFA) 108 (90 Base) MCG/ACT inhaler INHALE 2  PUFFS BY MOUTH EVERY 4 HOURS AS NEEDED FOR WHEEZING OR SHORTNESS OF BREATH (COUGHING FITS) 18 g 0   beclomethasone (QVAR) 80 MCG/ACT inhaler Inhale 2 puffs into the lungs 2 (two) times daily. Rinse mouth after each use. 1 each 5   busPIRone (BUSPAR) 5 MG tablet Take 1 tablet (5 mg total) by mouth 3 (three) times daily as needed (anxiety). 90 tablet 1   cyanocobalamin 1000 MCG tablet Take 1,000 mcg by mouth daily.     EPINEPHrine 0.3 mg/0.3 mL IJ SOAJ injection Inject 0.3 mg into the muscle as needed for anaphylaxis. 2 each 1   escitalopram (LEXAPRO) 20 MG tablet Take 1/2 tablet (10mg ) tablet by mouth daily for 2 weeks out of cycle 45 tablet 3   famotidine (PEPCID) 20 MG tablet Take 1 tablet (20 mg total) by mouth 2 (two) times daily. 60 tablet 3   fexofenadine (ALLEGRA) 180 MG tablet Take 180 mg by mouth in the morning and at bedtime.     magnesium oxide (MAG-OX) 400 (240 Mg) MG tablet Take 400 mg by mouth daily.     Melatonin 10 MG TABS Take 1 tablet by mouth at bedtime.     Multiple Vitamins-Minerals (MULTIPLE VITAMINS/WOMENS PO) Take 1 tablet by mouth daily in the afternoon.     omeprazole (PRILOSEC) 40 MG capsule Take 1 capsule (40 mg total) by mouth daily. 30 capsule 2   ondansetron (ZOFRAN) 4 MG tablet Take 1 tablet (4 mg total) by mouth 3 (three) times daily as needed for nausea or vomiting. 30 tablet 1   Turmeric 500 MG CAPS Take 1 capsule by mouth daily in the afternoon.     valACYclovir (VALTREX) 1000 MG tablet Take 2 tablets (2000 mg) by mouth twice a day for 24 hours as needed for cold sore. 30 tablet 1   No current facility-administered medications for this visit.     ALLERGIES: Shellfish allergy, Other, Latex, and Sulfa antibiotics  Family History  Problem Relation Age of Onset   Asthma Mother    Allergic rhinitis Mother    Diabetes Mother    Hypertension Mother    Stroke Mother        TIA   Factor V Leiden deficiency Mother    Other Mother        pituitary tumor   Colon  polyps Mother    Diverticulitis Mother    Allergic rhinitis Father    Eczema Father    Kidney cancer  Father        cancerous tumor of kidney   Ulcerative colitis Father    Allergic rhinitis Brother    Eczema Brother    Other Brother        ear tumor, dx. age 87-6   Breast cancer Maternal Grandmother        dx. 80s   Stroke Maternal Grandfather    Rheum arthritis Maternal Great-grandmother    Cancer Other    Ovarian cancer Neg Hx    Endometrial cancer Neg Hx    Colon cancer Neg Hx     Social History   Socioeconomic History   Marital status: Married    Spouse name: Not on file   Number of children: 0   Years of education: Not on file   Highest education level: Not on file  Occupational History   Not on file  Tobacco Use   Smoking status: Former    Current packs/day: 1.00    Types: Cigarettes    Passive exposure: Past   Smokeless tobacco: Never  Vaping Use   Vaping status: Never Used  Substance and Sexual Activity   Alcohol use: Not Currently   Drug use: No   Sexual activity: Yes    Partners: Male    Birth control/protection: Surgical, Pill    Comment: vasectomy  Other Topics Concern   Not on file  Social History Narrative   Moved from [place]   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   Social Drivers of Corporate investment banker Strain: Not on file  Food Insecurity: Not on file  Transportation Needs: Not on file  Physical Activity: Not on file  Stress: Not on file  Social Connections: Not on file  Intimate Partner Violence: Not on file    Review of Systems  All other systems reviewed and are negative.   PHYSICAL EXAMINATION:   BP 134/82 (BP Location: Left Arm, Patient Position: Sitting, Cuff Size: Small)   Pulse 87   Ht 5\' 6"  (1.676 m)   Wt 134 lb (60.8 kg)   LMP 10/18/2023 (Approximate)   SpO2 98%   BMI 21.63 kg/m     General appearance: alert, cooperative and appears stated age Breasts:  Right - normal  appearance, 2.5 cm smooth mass at 6:00 and 5 mm firm mass at 11:00.  No tenderness, No nipple retraction or dimpling, No nipple discharge or bleeding, No axillary or supraclavicular adenopathy Left - normal appearance, no mass or tenderness, No nipple retraction or dimpling, No nipple discharge or bleeding, No axillary or supraclavicular adenopathy  Chaperone was present for exam:  Warren Lacy, CMA  ASSESSMENT: At increased risk for breast cancer.  Lifetime risk 19.0%. Right breast masses:  2.5 cm smooth mass at 6:00.  5 mm firm mass at 11:00.  Status post right breast biopsy.  Benign fibroadenoma at 6:30 in 2021.  Status post laparoscopic left oophorectomy.  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  11/2019.  No evidence of recurrence.   PLAN:  Dx right mammogram and right breast US at [facility].  Annual exam end of July, 2025.   25 min  total time was spent for this patient encounter, including preparation, face-to-face counseling with the patient, coordination of care, and documentation of the encounter.

## 2023-11-07 NOTE — Telephone Encounter (Signed)
Spoke with Ebony at [facility]. Patient scheduled for first available appt on 11/16/23 at 1250.   Patient notified.   Encounter closed.

## 2023-11-07 NOTE — Telephone Encounter (Signed)
Please assist with scheduling dx right [facility]   She has 2 right breast masses:  2.5 cm smooth mass at 6:00.  5 mm firm mass at 11:00.   She states the area at 11:00 has changed.   She has hx of an ovarian granulosa cell tumor.

## 2023-11-16 ENCOUNTER — Ambulatory Visit
Admission: RE | Admit: 2023-11-16 | Discharge: 2023-11-16 | Disposition: A | Discharge status: Home / Self Care | Payer: No Typology Code available for payment source | Source: Ambulatory Visit | Attending: Obstetrics and Gynecology | Admitting: Obstetrics and Gynecology

## 2023-11-16 ENCOUNTER — Ambulatory Visit
Admission: RE | Admit: 2023-11-16 | Discharge: 2023-11-16 | Disposition: A | Discharge status: Home / Self Care | Payer: Medicaid Other | Source: Ambulatory Visit | Attending: Obstetrics and Gynecology | Admitting: Obstetrics and Gynecology

## 2023-11-16 DIAGNOSIS — N6315 Unspecified lump in the right breast, overlapping quadrants: Secondary | ICD-10-CM

## 2023-11-17 ENCOUNTER — Telehealth: Payer: Self-pay

## 2023-11-17 NOTE — Telephone Encounter (Signed)
Copied from CRM [id number]. Topic: General - Other >> Nov 16, 2023  2:23 PM [place] A wrote: Reason for CRM: Patient was returning call fro "Allysa"  Not seeing any phone notes for this patient for reason of phone call or from our office; pt had recent imaging done yesterday. Please advise if anything is needed from you and I can contact pt directly.

## 2023-11-17 NOTE — Telephone Encounter (Signed)
Patient is no longer needing anything, pt has been seen by Neuro and nothing further needed at this time.

## 2023-11-21 ENCOUNTER — Encounter: Payer: Self-pay | Admitting: Obstetrics and Gynecology

## 2023-11-21 ENCOUNTER — Ambulatory Visit: Payer: No Typology Code available for payment source | Admitting: Family Medicine

## 2023-11-21 ENCOUNTER — Ambulatory Visit (INDEPENDENT_AMBULATORY_CARE_PROVIDER_SITE_OTHER): Payer: No Typology Code available for payment source

## 2023-11-21 VITALS — BP 132/86 | HR 85 | Ht 66.0 in | Wt 139.0 lb

## 2023-11-21 DIAGNOSIS — S6291XA Unspecified fracture of right wrist and hand, initial encounter for closed fracture: Secondary | ICD-10-CM | POA: Diagnosis not present

## 2023-11-21 DIAGNOSIS — M79641 Pain in right hand: Secondary | ICD-10-CM | POA: Diagnosis not present

## 2023-11-21 MED ORDER — HYDROCODONE-ACETAMINOPHEN 5-325 MG PO TABS: 1.0000 | ORAL_TABLET | Freq: Four times a day (QID) | ORAL | 0 refills | Status: DC | PRN

## 2023-11-21 NOTE — Patient Instructions (Addendum)
Thank you for coming in today.   I've referred you to [facility] for a surgical consultation.  Let us know if you don't hear from them in one week.   Check back as needed

## 2023-11-21 NOTE — Progress Notes (Signed)
   Rubin Payor, PhD, LAT, ATC acting as a scribe for Kathryn Graham, MD.  Kathryn Park is a 38 y.o. female who presents to [facility] at [facility] today for R hand pain. Pt was previously seen by Dr. Denyse Amass on 10/28/23 for R shoulder pain.   Today, pt c/o R hand pain ongoing since this morning. Pt hit a door out of anger this morning, striking the door w/ the pinky side of her R hand. Pt locates pain to 5th finger, 5th MC. She notes a little bit of pain in the 3rd-4th fingers/MC.   Swelling: yes Aggravates: very TTP, making a fist,  Treatments tried: ice, Tylenol.   Pertinent review of systems: No fevers or chills  Relevant historical information: Ehlers-Danlos syndrome.  Dysautonomia.  History of left ovarian granulosa cell tumor   Exam:  BP 132/86   Pulse 85   Ht 5\' 6"  (1.676 m)   Wt 139 lb (63 kg)   LMP 10/18/2023 (Approximate)   SpO2 99%   BMI 22.44 kg/m  General: Well Developed, well nourished, and in no acute distress.   MSK: Right hand significant swelling at the fifth metacarpal.  Tender palpation.  Some mild scissoring is visible with flexion of the digits with some crossover.  Pulses capillary fill are intact distally.    Lab and Radiology Results  X-ray images right hand obtained today personally and independently interpreted. Fracture at proximal fifth metatarsal with angulation is visible. Await formal radiology review  Patient was placed into a custom made well-formed ulnar gutter fiberglass cast.   Assessment and Plan: 38 y.o. female with right fifth metacarpal fracture.  Unfortunately based on the location and angulation this likely will require surgery.  Refer to hand surgery.  Placed in ulnar gutter splint now.  Recheck as needed.  Hydrocodone prescribed for pain control.   PDMP reviewed during this encounter. Orders Placed This Encounter  Procedures   DG Hand Complete Right    Standing Status:   Future     Number of Occurrences:   1    Expiration Date:   12/19/2023    Reason for Exam (SYMPTOM  OR DIAGNOSIS REQUIRED):   right hand pain    Preferred imaging location?:   [place]    Is patient pregnant?:   No   Ambulatory referral to Orthopedic Surgery    Referral Priority:   Routine    Referral Type:   Surgical    Referral Reason:   Specialty Services Required    Requested Specialty:   Orthopedic Surgery    Number of Visits Requested:   1   Meds ordered this encounter  Medications   HYDROcodone-acetaminophen (NORCO/VICODIN) 5-325 MG tablet    Sig: Take 1 tablet by mouth every 6 (six) hours as needed.    Dispense:  15 tablet    Refill:  0     Discussed warning signs or symptoms. Please see discharge instructions. Patient expresses understanding.   The above documentation has been reviewed and is accurate and complete Kathryn Park, M.D.

## 2023-12-07 ENCOUNTER — Encounter: Payer: Self-pay | Admitting: Family Medicine

## 2023-12-07 NOTE — Progress Notes (Signed)
 Right hand x-ray as we discussed in clinic the other day shows a fracture.

## 2024-01-10 ENCOUNTER — Other Ambulatory Visit: Payer: Self-pay | Admitting: Gynecologic Oncology

## 2024-01-10 DIAGNOSIS — D3912 Neoplasm of uncertain behavior of left ovary: Secondary | ICD-10-CM

## 2024-01-23 ENCOUNTER — Ambulatory Visit: Admitting: Physician Assistant

## 2024-01-23 ENCOUNTER — Encounter: Payer: Self-pay | Admitting: Physician Assistant

## 2024-01-23 VITALS — BP 130/80 | HR 104 | Temp 98.8°F | Ht 66.0 in | Wt 130.0 lb

## 2024-01-23 DIAGNOSIS — Z0184 Encounter for antibody response examination: Secondary | ICD-10-CM

## 2024-01-23 DIAGNOSIS — J4520 Mild intermittent asthma, uncomplicated: Secondary | ICD-10-CM

## 2024-01-23 LAB — CBC WITH DIFFERENTIAL/PLATELET
Basophils Absolute: 0.1 10*3/uL (ref 0.0–0.1)
Basophils Relative: 0.8 % (ref 0.0–3.0)
Eosinophils Absolute: 0.2 10*3/uL (ref 0.0–0.7)
Eosinophils Relative: 2.9 % (ref 0.0–5.0)
HCT: 40.2 % (ref 36.0–46.0)
Hemoglobin: 13.5 g/dL (ref 12.0–15.0)
Lymphocytes Relative: 42.5 % (ref 12.0–46.0)
Lymphs Abs: 3.6 10*3/uL (ref 0.7–4.0)
MCHC: 33.5 g/dL (ref 30.0–36.0)
MCV: 86.7 fl (ref 78.0–100.0)
Monocytes Absolute: 0.6 10*3/uL (ref 0.1–1.0)
Monocytes Relative: 7.3 % (ref 3.0–12.0)
Neutro Abs: 4 10*3/uL (ref 1.4–7.7)
Neutrophils Relative %: 46.5 % (ref 43.0–77.0)
Platelets: 203 10*3/uL (ref 150.0–400.0)
RBC: 4.63 Mil/uL (ref 3.87–5.11)
RDW: 12.9 % (ref 11.5–15.5)
WBC: 8.6 10*3/uL (ref 4.0–10.5)

## 2024-01-23 LAB — COMPREHENSIVE METABOLIC PANEL WITH GFR
ALT: 8 U/L (ref 0–35)
AST: 13 U/L (ref 0–37)
Albumin: 4.5 g/dL (ref 3.5–5.2)
Alkaline Phosphatase: 43 U/L (ref 39–117)
BUN: 8 mg/dL (ref 6–23)
CO2: 25 meq/L (ref 19–32)
Calcium: 9.4 mg/dL (ref 8.4–10.5)
Chloride: 107 meq/L (ref 96–112)
Creatinine, Ser: 0.7 mg/dL (ref 0.40–1.20)
GFR: 109.7 mL/min (ref >60.00)
Glucose, Bld: 92 mg/dL (ref 70–99)
Potassium: 3.8 meq/L (ref 3.5–5.1)
Sodium: 139 meq/L (ref 135–145)
Total Bilirubin: 0.5 mg/dL (ref 0.2–1.2)
Total Protein: 7 g/dL (ref 6.0–8.3)

## 2024-01-23 NOTE — Patient Instructions (Signed)
 It was great to see you!  Pneumonia vaccine -- consider this; you are eligible due to your asthma diagnosis   MMR titers and hepatitis B titers will be drawn today and I can advise further on updating vaxx once these return  Take care,  Alexander Iba PA-C

## 2024-01-23 NOTE — Progress Notes (Signed)
 Kathryn Park Kimiya Brunelle is a 38 y.o. female here for a follow up of a pre-existing problem.  History of Present Illness:   Chief Complaint  Patient presents with   Immunity check    Pt would like titers done for MMR, Varicella, Hep B?    HPI  Titers (MMR, Varicella, Hep B): Pt contracted COVID during the Omicron outbreak and wants to check the current status of her immunity. Pt shows interest in a MMR booster, Varicella vaccine, and Hep B vaccine. She is also agreeable to having general blood work done today and a pneumonia vaccine in the future.  Past Medical History:  Diagnosis Date   Abnormal Pap smear of cervix    Allergy    Anemia    suspected poor diet; vegetarian during teens   Anxiety    Asthma    childhood   Depression    Dysmenorrhea    Ehlers-Danlos syndrome    Family history of breast cancer    Family history of kidney cancer    Granulosa cell tumor of ovary    History of iron deficiency anemia    age 72   Increased risk of breast cancer    23% lifetime risk   Joint pain    Labral tear of right hip joint    Leukocytosis    Low vitamin D  level    Migraine with aura    never on rx or saw neurology   Ovarian cancer (HCC) 2021   Ovarian cyst    Left   Pneumonia 01/2012    left lower lobe   Pre-diabetes 2015   had a "borderline" A1c   STD (sexually transmitted disease)    HSV1   Wears contact lenses    Wears glasses      Social History   Tobacco Use   Smoking status: Former    Current packs/day: 1.00    Types: Cigarettes    Passive exposure: Past   Smokeless tobacco: Never  Vaping Use   Vaping status: Never Used  Substance Use Topics   Alcohol use: Not Currently   Drug use: No    Past Surgical History:  Procedure Laterality Date   ADENOIDECTOMY     BREAST BIOPSY Right 04/2020   benign fibroadenoma at 6:30   COLPOSCOPY     LAPAROSCOPIC OVARIAN CYSTECTOMY Left 11/13/2019   Procedure: LAPAROSCOPIC OVARIAN CYSTECTOMY/POSSIBLE  LEFT OOPHORECTOMY/ COLLECTION OF PELVIC WASHINGS/ks;  Surgeon: [name], MD;  Location: [facility];  Service: Gynecology;  Laterality: Left;  Possible left oophorectomy Colection of pelvic washings To follow first case of the day   LEFT OOPHORECTOMY Left 11/2019   right hip arthroscopy  01/2022   for laberal tear repair   TONSILLECTOMY     UPPER GI ENDOSCOPY      Family History  Problem Relation Age of Onset   Asthma Mother    Allergic rhinitis Mother    Diabetes Mother    Hypertension Mother    Stroke Mother        TIA   Factor V Leiden deficiency Mother    Other Mother        pituitary tumor   Colon polyps Mother    Diverticulitis Mother    Allergic rhinitis Father    Eczema Father    Kidney cancer Father        cancerous tumor of kidney   Ulcerative colitis Father    Allergic rhinitis Brother  Eczema Brother    Other Brother        ear tumor, dx. age 22-6   Breast cancer Maternal Grandmother        dx. 80s   Stroke Maternal Grandfather    Rheum arthritis Maternal Great-grandmother    Cancer Other    Ovarian cancer Neg Hx    Endometrial cancer Neg Hx    Colon cancer Neg Hx     Allergies  Allergen Reactions   Shellfish Allergy Anaphylaxis    Throat closes   Other Other (See Comments)    Watermelon, mold/mildew, pet dander, dust and dust mites  Swelling of eyes and congestion   Latex Rash   Sulfa Antibiotics Rash    Current Medications:   Current Outpatient Medications:    albuterol  (VENTOLIN  HFA) 108 (90 Base) MCG/ACT inhaler, INHALE 2 PUFFS BY MOUTH EVERY 4 HOURS AS NEEDED FOR WHEEZING OR SHORTNESS OF BREATH (COUGHING FITS), Disp: 18 g, Rfl: 0   beclomethasone (QVAR) 80 MCG/ACT inhaler, Inhale 2 puffs into the lungs 2 (two) times daily. Rinse mouth after each use., Disp: 1 each, Rfl: 5   cyanocobalamin  1000 MCG tablet, Take 1,000 mcg by mouth daily., Disp: , Rfl:    EPINEPHrine  0.3 mg/0.3 mL IJ SOAJ injection, Inject 0.3  mg into the muscle as needed for anaphylaxis., Disp: 2 each, Rfl: 1   escitalopram  (LEXAPRO ) 20 MG tablet, Take 1/2 tablet (10mg ) tablet by mouth daily for 2 weeks out of cycle, Disp: 45 tablet, Rfl: 3   famotidine  (PEPCID ) 20 MG tablet, Take 1 tablet (20 mg total) by mouth 2 (two) times daily., Disp: 60 tablet, Rfl: 3   fexofenadine (ALLEGRA) 180 MG tablet, Take 180 mg by mouth in the morning and at bedtime., Disp: , Rfl:    HYDROcodone -acetaminophen  (NORCO/VICODIN) 5-325 MG tablet, Take 1 tablet by mouth every 6 (six) hours as needed., Disp: 15 tablet, Rfl: 0   magnesium oxide (MAG-OX) 400 (240 Mg) MG tablet, Take 400 mg by mouth daily., Disp: , Rfl:    Melatonin 10 MG TABS, Take 1 tablet by mouth at bedtime., Disp: , Rfl:    ondansetron  (ZOFRAN ) 4 MG tablet, Take 1 tablet (4 mg total) by mouth 3 (three) times daily as needed for nausea or vomiting., Disp: 30 tablet, Rfl: 1   QUERCETIN PO, Take 1 capsule by mouth daily in the afternoon., Disp: , Rfl:    Turmeric 500 MG CAPS, Take 1 capsule by mouth daily in the afternoon., Disp: , Rfl:    valACYclovir  (VALTREX ) 1000 MG tablet, Take 2 tablets (2000 mg) by mouth twice a day for 24 hours as needed for cold sore., Disp: 30 tablet, Rfl: 1   Review of Systems:   Negative unless otherwise specified per HPI.  Vitals:   Vitals:   01/23/24 1327  BP: 130/80  Pulse: (!) 104  Temp: 98.8 F (37.1 C)  TempSrc: Temporal  SpO2: 99%  Weight: 130 lb (59 kg)  Height: 5\' 6"  (1.676 m)     Body mass index is 20.98 kg/m.  Physical Exam:   Physical Exam Vitals and nursing note reviewed.  Constitutional:      General: She is not in acute distress.    Appearance: Normal appearance. She is well-developed. She is not ill-appearing or toxic-appearing.  HENT:     Head: Normocephalic and atraumatic.  Eyes:     General: Lids are normal.     Extraocular Movements: Extraocular movements intact.     Conjunctiva/sclera:  Conjunctivae normal.   Cardiovascular:     Rate and Rhythm: Normal rate and regular rhythm.     Pulses: Normal pulses.     Heart sounds: Normal heart sounds, S1 normal and S2 normal.  Pulmonary:     Effort: Pulmonary effort is normal.     Breath sounds: Normal breath sounds.  Musculoskeletal:        General: Normal range of motion.     Cervical back: Normal range of motion and neck supple.  Skin:    General: Skin is warm and dry.  Neurological:     Mental Status: She is alert and oriented to person, place, and time.     GCS: GCS eye subscore is 4. GCS verbal subscore is 5. GCS motor subscore is 6.  Psychiatric:        Attention and Perception: Attention and perception normal.        Mood and Affect: Mood normal. Affect is tearful.        Speech: Speech normal.        Behavior: Behavior normal. Behavior is cooperative.        Thought Content: Thought content normal.        Judgment: Judgment normal.     Assessment and Plan:   Immunity status testing (Primary) - Measles/Mumps/Rubella Immunity - CBC with Differential/Platelet - Comprehensive metabolic panel with GFR - Hepatitis B surface antibody,quantitative  Update blood work and provide recommendations  Mild intermittent asthma without complication She is eligible for pneumonia vaccine if she is interested this I did encourage her to reach out to her insurance to see if this is covered for this indication  I, Timoteo Force, acting as a Neurosurgeon for [employer], [place]., have documented all relevant documentation on the behalf of Alexander Iba, [place], as directed by  Alexander Iba, PA while in the presence of Alexander Iba, [place].  I, Alexander Iba, [place], have reviewed all documentation for this visit. The documentation on 01/23/24 for the exam, diagnosis, procedures, and orders are all accurate and complete.  Alexander Iba, PA-C

## 2024-01-24 ENCOUNTER — Encounter: Payer: Self-pay | Admitting: Physician Assistant

## 2024-01-24 LAB — MEASLES/MUMPS/RUBELLA IMMUNITY
Mumps IgG: 113 [AU]/ml
Rubella: 8.25 {index}
Rubeola IgG: 80.5 [AU]/ml

## 2024-01-24 LAB — HEPATITIS B SURFACE ANTIBODY, QUANTITATIVE: Hep B S AB Quant (Post): 232 m[IU]/mL (ref >=10)

## 2024-01-31 ENCOUNTER — Ambulatory Visit: Admitting: Family Medicine

## 2024-02-06 NOTE — Progress Notes (Unsigned)
   Joanna Muck, PhD, LAT, ATC acting as a scribe for Garlan Juniper, MD.  Kathryn Park is a 38 y.o. female who presents to [facility] at [facility] today for f/u R 5th MC fx. Pt was last seen by Dr. [name] on 2/17   Pertinent review of systems: ***  Relevant historical information: ***   Exam:  LMP 12/31/2023 (Exact Date)  General: Well Developed, well nourished, and in no acute distress.   MSK: ***    Lab and Radiology Results No results found for this or any previous visit (from the past 72 hours). No results found.     Assessment and Plan: 38 y.o. female with ***   PDMP not reviewed this encounter. No orders of the defined types were placed in this encounter.  No orders of the defined types were placed in this encounter.    Discussed warning signs or symptoms. Please see discharge instructions. Patient expresses understanding.   ***

## 2024-02-07 ENCOUNTER — Encounter: Payer: Self-pay | Admitting: Family Medicine

## 2024-02-07 ENCOUNTER — Ambulatory Visit (INDEPENDENT_AMBULATORY_CARE_PROVIDER_SITE_OTHER): Admitting: Family Medicine

## 2024-02-07 VITALS — BP 122/86 | HR 79 | Ht 66.0 in | Wt 136.0 lb

## 2024-02-07 DIAGNOSIS — M25511 Pain in right shoulder: Secondary | ICD-10-CM

## 2024-02-07 DIAGNOSIS — G8929 Other chronic pain: Secondary | ICD-10-CM

## 2024-02-07 DIAGNOSIS — R0781 Pleurodynia: Secondary | ICD-10-CM

## 2024-02-07 DIAGNOSIS — M542 Cervicalgia: Secondary | ICD-10-CM | POA: Diagnosis not present

## 2024-02-07 DIAGNOSIS — Q796 Ehlers-Danlos syndrome, unspecified: Secondary | ICD-10-CM

## 2024-02-07 DIAGNOSIS — M79641 Pain in right hand: Secondary | ICD-10-CM

## 2024-02-07 DIAGNOSIS — R269 Unspecified abnormalities of gait and mobility: Secondary | ICD-10-CM | POA: Diagnosis not present

## 2024-02-07 NOTE — Patient Instructions (Addendum)
 Thank you for coming in today.   Look up Mg12 with CBD  Information provided for cervical traction collar  I've referred you to Occupational Therapy.  Let us  know if you don't hear from them in one week.   I will talk to radiology  Let me know how it goes at [facility]

## 2024-02-09 ENCOUNTER — Ambulatory Visit (INDEPENDENT_AMBULATORY_CARE_PROVIDER_SITE_OTHER)

## 2024-02-09 ENCOUNTER — Ambulatory Visit: Admitting: Occupational Therapy

## 2024-02-09 DIAGNOSIS — M542 Cervicalgia: Secondary | ICD-10-CM

## 2024-02-09 DIAGNOSIS — Q796 Ehlers-Danlos syndrome, unspecified: Secondary | ICD-10-CM | POA: Diagnosis not present

## 2024-02-19 ENCOUNTER — Ambulatory Visit: Payer: Self-pay | Admitting: Family Medicine

## 2024-02-19 NOTE — Progress Notes (Signed)
 Flexion and extension x-ray views look okay.  No visible shifting is seen.  You do have a little bit of arthritis at C5-C6.

## 2024-02-20 NOTE — Therapy (Signed)
 OUTPATIENT OCCUPATIONAL THERAPY ORTHO EVALUATION  Patient Name: Kathryn Park MRN: [id number] DOB:April 28, 1986, 38 y.o., female Today's Date: 02/21/2024  PCP: Dulcie Gibes. PA REFERRING PROVIDER: [name], MD   END OF SESSION:  OT End of Session - 02/21/24 1303     Visit Number 1    Number of Visits 8    Date for OT Re-Evaluation 04/06/24    Authorization Type [employer]    OT Start Time 1303    OT Stop Time 1349    OT Time Calculation (min) 46 min    Activity Tolerance Patient tolerated treatment well;No increased pain;Patient limited by pain;Patient limited by fatigue    Behavior During Therapy [facility] for tasks assessed/performed             Past Medical History:  Diagnosis Date   Abnormal Pap smear of cervix    Allergy    Anemia    suspected poor diet; vegetarian during teens   Anxiety    Asthma    childhood   Depression    Dysmenorrhea    Ehlers-Danlos syndrome    Family history of breast cancer    Family history of kidney cancer    Granulosa cell tumor of ovary    History of iron deficiency anemia    age 55   Increased risk of breast cancer    23% lifetime risk   Joint pain    Labral tear of right hip joint    Leukocytosis    Low vitamin D  level    Migraine with aura    never on rx or saw neurology   Ovarian cancer (HCC) 2021   Ovarian cyst    Left   Pneumonia 01/2012    left lower lobe   Pre-diabetes 2015   had a "borderline" A1c   STD (sexually transmitted disease)    HSV1   Wears contact lenses    Wears glasses    Past Surgical History:  Procedure Laterality Date   ADENOIDECTOMY     BREAST BIOPSY Right 04/2020   benign fibroadenoma at 6:30   COLPOSCOPY     LAPAROSCOPIC OVARIAN CYSTECTOMY Left 11/13/2019   Procedure: LAPAROSCOPIC OVARIAN CYSTECTOMY/POSSIBLE LEFT OOPHORECTOMY/ COLLECTION OF PELVIC WASHINGS/ks;  Surgeon: [name], MD;  Location: [facility];  Service: Gynecology;  Laterality:  Left;  Possible left oophorectomy Colection of pelvic washings To follow first case of the day   LEFT OOPHORECTOMY Left 11/2019   right hip arthroscopy  01/2022   for laberal tear repair   TONSILLECTOMY     UPPER GI ENDOSCOPY     Patient Active Problem List   Diagnosis Date Noted   Dysautonomia (HCC) 03/15/2023   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 09/29/2022   Other adverse food reactions, not elsewhere classified, subsequent encounter 09/29/2022   Other allergic rhinitis 09/29/2022   Rash and other nonspecific skin eruption 09/29/2022   Pruritus 09/29/2022   Mild intermittent asthma without complication 09/29/2022   Gastroesophageal reflux disease 09/29/2022   EDS (Ehlers-Danlos syndrome) 06/10/2022   Tear of right acetabular labrum 07/22/2021   Breast lump 02/12/2021   Bilateral hip pain 11/25/2020   Positive ANA (antinuclear antibody) 11/25/2020   Other fatigue 11/25/2020   Bladder retention 10/13/2020   HSV-1 (herpes simplex virus 1) infection 10/13/2020   History of anorexia nervosa 10/13/2020   GAD (generalized anxiety disorder) 10/13/2020   Family history of breast cancer    Family history of kidney cancer  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    ONSET DATE: DOI: 11/21/23  REFERRING DIAG: Z61.096 (ICD-10-CM) - Right hand pain   THERAPY DIAG:  Muscle weakness (generalized)  Pain in right hand  Stiffness of right hand, not elsewhere classified  Rationale for Evaluation and Treatment: Rehabilitation  SUBJECTIVE:   SUBJECTIVE STATEMENT: She states she has a malrotation of the right hand small finger with associated pain and weakness and decreased functional ability after breaking it about 3 months ago.  She wore a cast for 3 weeks, and transitioned to a buddy strap that she couldn't tolerate and d/c'd quickly.   Now she has some some crossing over, Also states hx of OA in her fingers/thumb and hx of EDS. She also has multiple falls in a day that present  as tightness in her back that causes her body to draw tightly and lose balance.      PERTINENT HISTORY: Per referral: "Evaluate and Treat for R hand pain after 5th MC fx and shoulder pain 1-2 times per week for 4-6 weeks."   "Pt hit a door out of anger this morning, striking the door w/ the pinky side of her R hand. Pt locates pain to 5th finger, 5th MC. She notes a little bit of pain in the 3rd-4th fingers/MC "   Per imaging: " mildly comminuted transverse fracture through the proximal aspect of the fifth metacarpal. There is mild apex posterior angulation. Distal fracture fragment is displaced posteriorly 3 mm. There is overlying soft tissue swelling. There is no dislocation."   Past Medical Hx includes cervical CA and she wears a K-[id number] today.   PRECAUTIONS: Fall and Other: latex allergies.  For fall risk, OT will use close approximation and she is following up with [facility] for an "ataxia" clinic which will work on her balance as well.   RED FLAGS: None   WEIGHT BEARING RESTRICTIONS: No  PAIN:  Are you having pain? Yes: NPRS scale: 2-3/10  at rest now,  at worst in past week up to 7-8/10 with poor weather and gripping/repetition  Pain location: Right hand right hand small finger and metacarpal area also thumb CMC joints Pain description: Aching and sore Aggravating factors: Tight gripping Relieving factors: Rest  FALLS: Has patient fallen in last 6 months? Yes. Number of falls she reports multiple falls a day at times.  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  She will be following up with [facility] for this issue and has had multiple MRIs, though there is no consensus on why she has these balance and cramping issues now  LIVING ENVIRONMENT: Lives with: lives with their family Lives in: House/apartment Has following equipment at home: Single point cane and possibly others  PLOF: Independent with basic ADLs,  Independent with household mobility without device, Independent with community mobility with device, and Needs assistance with homemaking  PATIENT GOALS: To improve pain in the right hand due to the small finger fracture and crossing over, and also learn strategies to deal with hand arthritis and pain.  She would also like to work on right shoulder pain  NEXT MD VISIT: As needed with Dr. [name]  OBJECTIVE: (All objective assessments below are from initial evaluation on: 02/21/2024 unless otherwise specified.)    HAND DOMINANCE: Right   ADLs: Overall ADLs: States decreased ability to grab, hold household objects, pain and difficulty to open containers, perform FMS tasks (manipulate fasteners on  clothing)   FUNCTIONAL OUTCOME MEASURES: Eval: Patient Specific Functional Scale: 3 (Writing, computer work, food prep)  (Higher Score  =  Better Ability for the Selected Tasks)       UPPER EXTREMITY ROM     Shoulder to Wrist AROM Right eval  Shoulder flexion Approximately 165 degrees  Shoulder abduction   Shoulder extension   Shoulder internal rotation Approximately 60 degrees  Shoulder external rotation Approximately 85 degrees  Elbow flexion Within normal limits  Elbow extension Within normal limits  Forearm supination   Forearm pronation    Wrist flexion 85  Wrist extension 75  Wrist ulnar deviation   Wrist radial deviation   Functional dart thrower's motion (F-DTM) in ulnar flexion   F-DTM in radial extension    (Blank rows = not tested)   Hand AROM Right eval  Full Fist Ability (or Gap to Distal Palmar Crease) full  Thumb Opposition  (Kapandji Scale)  10/10  Thumb MCP (0-60)   Thumb IP (0-80)   Thumb Radial Abduction Span   Thumb Palmar Abduction Span   Index MCP (0-90)   Index PIP (0-100)   Index DIP (0-70)    Long MCP (0-90)    Long PIP (0-100)    Long DIP (0-70)    Ring MCP (0-90)    Ring PIP (0-100)    Ring DIP (0-70)    Little MCP (0-90)  0 - 89  Little PIP  (0-100)  0 - 96  Little DIP (0-70)  (+3) - 75  (Blank rows = not tested)   HAND FUNCTION: Eval: Observed weakness in affected Rt hand.  Grip strength Right: 44 lbs, Left: 64 lbs   COORDINATION: Eval: No issues noted today for fine motor skills, she does have some instability with gross motor skills, she complains about pain with writing and other coordinated activities  SENSATION: Eval:  Light touch intact today no complaints  COGNITION: Eval: Overall cognitive status: WFL for evaluation today   OBSERVATIONS:   Eval: She has a swollen lump over the base of the dorsum of the fifth metacarpal of the right hand, she is largely nontender now, she has some radial rotation at the MCP joint causing some scissoring or crossing over but this is passively flexible today.  When she makes a fist she does not always scissoring or crossover.  She does have some intrinsic tightness in the right hand  She is able to make her right shoulder grind and pop in strange ways if she sits on her hand and internal rotation and flexes her scapula.  She was told to not do this and not try to cause problems or pain or tearing around her arm or shoulder due to her hyperflexibility.  Overall, she seems to have some scapular weakness and instability which is typical with EDS but also some tightness with internal rotation.  Lastly she also appears to have some CMC joint thumb arthritis bilaterally worse in the right hand.  We can work on this as well with recommendations and exercises.   TODAY'S TREATMENT:  Post-evaluation treatment:   For safety/self-care she was asked to keep her joint range of motion and normal planes of motion and try to actively avoid hyperextension or hyperflexion or positions of instability around the hand or the shoulder.  She was also given the following home exercise program today that focused on rehabilitation of the small finger and right hand as well as trying to correct rotational  deformities as well as  trying to manage thumb arthritis, with stretches, isometric stability activities, gentle gripping activities, etc.  These were reviewed with her quickly and she states understanding and no pain.  OT will provide additional exercises to help manage right shoulder instability and pain as helpful in the next session.   Exercises - Bend and Pull Back Wrist SLOWLY  - 3-4 x daily - 10-15 reps - Tendon Glides (Pen push-ups)- 3-4 x daily - 10-15 reps - Finger Spreading  - 3-4 x daily - 10-15 reps - 5 sec hold - Seated Finger Composite Flexion Stretch  - 4 x daily - 3-5 reps - 15 hold - Stretch thumb toward base of small finger (put hand in LAP)  - 2-3 x daily - 3-5 reps - 15 sec hold - Thumb Webspace Stretch  - 3 x daily - 3 reps - 15 sec hold - Towel Roll Grip with Forearm in Neutral  - 3 x daily - 5 reps - 10 sec hold  PATIENT EDUCATION: Education details: See tx section above for details  Person educated: Patient Education method: Verbal Instruction, Teach back, Handouts  Education comprehension: States and demonstrates understanding, Additional Education required    HOME EXERCISE PROGRAM: Access Code: [email or web address] URL: [email or web address] Date: 02/21/2024 Prepared by: Leartis Proud   GOALS: Goals reviewed with patient? Yes   SHORT TERM GOALS: (STG required if POC>30 days) Target Date: 03/09/2024  Pt will obtain protective, custom orthotic. Goal status: TBD/PRN  2.  Pt will demo/state understanding of initial HEP to improve pain levels and prerequisite motion. Goal status: INITIAL   LONG TERM GOALS: Target Date: 04/06/2024  Pt will improve functional ability by decreased impairment per PSFS assessment from 3 to 6 or better, for better quality of life. Goal status: INITIAL  2.  Pt will improve grip strength in right hand from 44 lbs to at least 50 to lbs for functional use at home and in IADLs. Goal status: INITIAL  3.  Pt will improve  A/ROM in right shoulder internal rotation from approx 60 degrees to approx 70 degrees, to have functional motion for tasks like reach and grasp.  Goal status: INITIAL  4.  Pt will improve strength in right scapula to improve dynamic scapular motion, reduce pain and symptoms.   Goal status: INITIAL  5.  Pt will improve coordination skills in right hand by her stated ability for better writing ability with less pain and problems.  Goal status: INITIAL  6.  Pt will decrease pain at worst from 7-8/10 to 3/10 or better to have better sleep and occupational participation in daily roles. Goal status: INITIAL   ASSESSMENT:  CLINICAL IMPRESSION: Patient is a 38 y.o. female who was seen today for occupational therapy evaluation for multiple complaints including painful right hand small finger 3 months since metacarpal fracture, decreased coordination and motor skills, arthritis pain in bilateral thumb joints, right shoulder pain and instability.  She will benefit from outpatient occupational therapy to help decrease the symptoms and improve her quality of life.   PERFORMANCE DEFICITS: in functional skills including ADLs, IADLs, coordination, ROM, strength, pain, fascial restrictions, Gross motor control, body mechanics, endurance, decreased knowledge of precautions, and UE functional use, cognitive skills including problem solving and safety awareness, and psychosocial skills including coping strategies, environmental adaptation, habits, and routines and behaviors.   IMPAIRMENTS: are limiting patient from ADLs, IADLs, and leisure.   COMORBIDITIES: has co-morbidities such as history of cancer, Ehlers-Danlos syndrome, anxiety and depression, ataxia and poor balance, and  possibly others that affects occupational performance. Patient will benefit from skilled OT to address above impairments and improve overall function.  MODIFICATION OR ASSISTANCE TO COMPLETE EVALUATION: Min-Moderate modification of tasks  or assist with assess necessary to complete an evaluation.  OT OCCUPATIONAL PROFILE AND HISTORY: Detailed assessment: Review of records and additional review of physical, cognitive, psychosocial history related to current functional performance.  CLINICAL DECISION MAKING: High - multiple treatment options, significant modification of task necessary  REHAB POTENTIAL: Good  EVALUATION COMPLEXITY: Moderate      PLAN:  OT FREQUENCY: 1-2x/week  OT DURATION: 6 weeks through 04/06/2024 and up to 8 total visits as needed  PLANNED INTERVENTIONS: 97535 self care/ADL training, 40981 therapeutic exercise, 97530 therapeutic activity, 97112 neuromuscular re-education, 97140 manual therapy, 97760 Orthotic Initial, 97763 Orthotic/Prosthetic subsequent, Dry needling, coping strategies training, patient/family education, and DME and/or AE instructions  RECOMMENDED OTHER SERVICES: None now that she will be following up with other medical providers for ataxia, poor gait, other issues  CONSULTED AND AGREED WITH PLAN OF CARE: Patient  PLAN FOR NEXT SESSION:   Also give out Rt sh program, review initial recommendations and HEP for hand pain, crossing over, arthritis, etc.   Leartis Proud, OTR/L, CHT 02/21/2024, 3:46 PM

## 2024-02-21 ENCOUNTER — Encounter: Payer: Self-pay | Admitting: Rehabilitative and Restorative Service Providers"

## 2024-02-21 ENCOUNTER — Ambulatory Visit: Admitting: Rehabilitative and Restorative Service Providers"

## 2024-02-21 DIAGNOSIS — M6281 Muscle weakness (generalized): Secondary | ICD-10-CM

## 2024-02-21 DIAGNOSIS — M25641 Stiffness of right hand, not elsewhere classified: Secondary | ICD-10-CM

## 2024-02-21 DIAGNOSIS — M79641 Pain in right hand: Secondary | ICD-10-CM | POA: Diagnosis not present

## 2024-02-22 ENCOUNTER — Other Ambulatory Visit: Payer: Self-pay | Admitting: Gynecologic Oncology

## 2024-02-22 DIAGNOSIS — D3912 Neoplasm of uncertain behavior of left ovary: Secondary | ICD-10-CM

## 2024-02-23 ENCOUNTER — Ambulatory Visit: Payer: BC Managed Care – PPO | Admitting: Gynecologic Oncology

## 2024-02-23 ENCOUNTER — Inpatient Hospital Stay: Payer: BC Managed Care – PPO | Attending: Gynecologic Oncology

## 2024-02-23 DIAGNOSIS — Z90721 Acquired absence of ovaries, unilateral: Secondary | ICD-10-CM | POA: Insufficient documentation

## 2024-02-23 DIAGNOSIS — Z8603 Personal history of neoplasm of uncertain behavior: Secondary | ICD-10-CM | POA: Diagnosis present

## 2024-02-23 DIAGNOSIS — D3912 Neoplasm of uncertain behavior of left ovary: Secondary | ICD-10-CM

## 2024-02-23 DIAGNOSIS — Q796 Ehlers-Danlos syndrome, unspecified: Secondary | ICD-10-CM | POA: Diagnosis not present

## 2024-02-29 LAB — ANTI MULLERIAN HORMONE: ANTI-MULLERIAN HORMONE (AMH): 1.21 ng/mL

## 2024-02-29 LAB — INHIBIN B: Inhibin B: 122.3 pg/mL

## 2024-03-01 ENCOUNTER — Encounter: Payer: Self-pay | Admitting: Gynecologic Oncology

## 2024-03-01 ENCOUNTER — Inpatient Hospital Stay (HOSPITAL_BASED_OUTPATIENT_CLINIC_OR_DEPARTMENT_OTHER): Payer: Self-pay | Admitting: Gynecologic Oncology

## 2024-03-01 VITALS — BP 110/60 | HR 74 | Temp 99.0°F | Resp 16 | Ht 66.0 in | Wt 131.0 lb

## 2024-03-01 DIAGNOSIS — Z8603 Personal history of neoplasm of uncertain behavior: Secondary | ICD-10-CM | POA: Diagnosis not present

## 2024-03-01 DIAGNOSIS — D3912 Neoplasm of uncertain behavior of left ovary: Secondary | ICD-10-CM

## 2024-03-01 NOTE — Progress Notes (Signed)
 Gynecologic Oncology Return Clinic Visit  03/01/24  Reason for Visit: surveillance in the setting of granulosa cell tumor   Treatment History: Oncology History  Granulosa cell tumor of left ovary  10/18/2019 Imaging   Pelvic ultrasound: 11 x 7 x 9 cm left ovarian cyst, avascular with thin septation.  No free fluid.  Right ovary normal in appearance.   11/13/2019 Surgery   Laparoscopic left oophorectomy with collection of pelvic washings, lysis of adhesion.  Findings at the time of surgery were a 12 cm left ovarian cyst.  No ascites or intra-abdominal/pelvic disease.  Ovary was removed in a contained fashion and in an Endo Catch bag.   11/13/2019 Pathology Results   Left ovary showing granulosa cell tumor, 2.1 cm.  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 Pelvic washings negative for malignant cells.   11/13/2019 Initial Diagnosis   Granulosa cell tumor of left ovary   11/29/2019 Imaging   CT A/P: No acute findings in the abdomen or pelvis.  Specifically, no evidence for metastatic disease in the abdomen or pelvis. Trace free fluid in the cul-de-sac.  This can be physiologic in a premenopausal female.   12/27/2019 Tumor Marker   Patient's blood was tested for the following markers: Inh B, [facility] Results of the tumor marker test revealed: 83, 1.07.     Interval History: Still struggling with ataxia.  She is seeing specialist at [facility] in a couple of weeks.  Notes pelvic pain that fluctuates with her menstrual cycle, at baseline.  Has occasional right sided pelvic pain, typically midcycle.  Came off of progesterone as she did not think this was helping to reduce her symptoms and was unsure whether she was deriving any benefit.  Denies any change in her EDS-related symptoms.  Menstrual cycles have remained between 24 and 28 days, denies any change to bleeding.  Has occasional mild urinary retention, otherwise reports baseline bladder symptoms.  Denies any recent  changes to her bowel function.  Past Medical/Surgical History: Past Medical History:  Diagnosis Date   Abnormal Pap smear of cervix    Allergy    Anemia    suspected poor diet; vegetarian during teens   Anxiety    Asthma    childhood   Depression    Dysmenorrhea    Ehlers-Danlos syndrome    Family history of breast cancer    Family history of kidney cancer    Granulosa cell tumor of ovary    History of iron deficiency anemia    age 38   Increased risk of breast cancer    23% lifetime risk   Joint pain    Labral tear of right hip joint    Leukocytosis    Low vitamin D  level    Migraine with aura    never on rx or saw neurology   Ovarian cancer (HCC) 2021   Ovarian cyst    Left   Pneumonia 01/2012    left lower lobe   Pre-diabetes 2015   had a "borderline" A1c   STD (sexually transmitted disease)    HSV1   Wears contact lenses    Wears glasses     Past Surgical History:  Procedure Laterality Date   ADENOIDECTOMY     BREAST BIOPSY Right 04/2020   benign fibroadenoma at 6:30   COLPOSCOPY     LAPAROSCOPIC OVARIAN CYSTECTOMY Left 11/13/2019   Procedure: LAPAROSCOPIC OVARIAN CYSTECTOMY/POSSIBLE LEFT OOPHORECTOMY/ COLLECTION OF PELVIC WASHINGS/ks;  Surgeon: [name], MD;  Location: [facility];  Service: Gynecology;  Laterality: Left;  Possible left oophorectomy Colection of pelvic washings To follow first case of the day   LEFT OOPHORECTOMY Left 11/2019   right hip arthroscopy  01/2022   for laberal tear repair   TONSILLECTOMY     UPPER GI ENDOSCOPY      Family History  Problem Relation Age of Onset   Asthma Mother    Allergic rhinitis Mother    Diabetes Mother    Hypertension Mother    Stroke Mother        TIA   Factor V Leiden deficiency Mother    Other Mother        pituitary tumor   Colon polyps Mother    Diverticulitis Mother    Allergic rhinitis Father    Eczema Father    Kidney cancer Father        cancerous  tumor of kidney   Ulcerative colitis Father    Allergic rhinitis Brother    Eczema Brother    Other Brother        ear tumor, dx. age 41-6   Breast cancer Maternal Grandmother        dx. 80s   Stroke Maternal Grandfather    Rheum arthritis Maternal Great-grandmother    Cancer Other    Ovarian cancer Neg Hx    Endometrial cancer Neg Hx    Colon cancer Neg Hx     Social History   Socioeconomic History   Marital status: Married    Spouse name: Not on file   Number of children: 0   Years of education: Not on file   Highest education level: Not on file  Occupational History   Not on file  Tobacco Use   Smoking status: Former    Current packs/day: 1.00    Types: Cigarettes    Passive exposure: Past   Smokeless tobacco: Never  Vaping Use   Vaping status: Never Used  Substance and Sexual Activity   Alcohol use: Not Currently   Drug use: No   Sexual activity: Yes    Partners: Male    Birth control/protection: Surgical, Pill    Comment: vasectomy  Other Topics Concern   Not on file  Social History Narrative   Moved from [place]   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   Social Drivers of Corporate investment banker Strain: Not on file  Food Insecurity: Not on file  Transportation Needs: Not on file  Physical Activity: Not on file  Stress: Not on file  Social Connections: Not on file    Current Medications:  Current Outpatient Medications:    albuterol  (VENTOLIN  HFA) 108 (90 Base) MCG/ACT inhaler, INHALE 2 PUFFS BY MOUTH EVERY 4 HOURS AS NEEDED FOR WHEEZING OR SHORTNESS OF BREATH (COUGHING FITS), Disp: 18 g, Rfl: 0   beclomethasone (QVAR) 80 MCG/ACT inhaler, Inhale 2 puffs into the lungs 2 (two) times daily. Rinse mouth after each use., Disp: 1 each, Rfl: 5   cyanocobalamin  1000 MCG tablet, Take 1,000 mcg by mouth daily., Disp: , Rfl:    EPINEPHrine  0.3 mg/0.3 mL IJ SOAJ injection, Inject 0.3 mg into the muscle as needed for  anaphylaxis., Disp: 2 each, Rfl: 1   escitalopram  (LEXAPRO ) 20 MG tablet, Take 1/2 tablet (10mg ) tablet by mouth daily for 2 weeks out of cycle, Disp: 45 tablet, Rfl: 3   famotidine  (PEPCID ) 20 MG tablet, Take 1 tablet (  20 mg total) by mouth 2 (two) times daily., Disp: 60 tablet, Rfl: 3   fexofenadine (ALLEGRA) 180 MG tablet, Take 180 mg by mouth in the morning and at bedtime., Disp: , Rfl:    magnesium oxide (MAG-OX) 400 (240 Mg) MG tablet, Take 400 mg by mouth daily., Disp: , Rfl:    Melatonin 10 MG TABS, Take 1 tablet by mouth at bedtime., Disp: , Rfl:    ondansetron  (ZOFRAN ) 4 MG tablet, Take 1 tablet (4 mg total) by mouth 3 (three) times daily as needed for nausea or vomiting., Disp: 30 tablet, Rfl: 1   QUERCETIN PO, Take 1 capsule by mouth daily in the afternoon., Disp: , Rfl:    Turmeric 500 MG CAPS, Take 1 capsule by mouth daily in the afternoon., Disp: , Rfl:    valACYclovir  (VALTREX ) 1000 MG tablet, Take 2 tablets (2000 mg) by mouth twice a day for 24 hours as needed for cold sore., Disp: 30 tablet, Rfl: 1  Review of Systems: Denies appetite changes, fevers, chills, fatigue, unexplained weight changes. Denies hearing loss, neck lumps or masses, mouth sores, ringing in ears or voice changes. Denies cough or wheezing.  Denies shortness of breath. Denies chest pain or palpitations. Denies leg swelling. Denies abdominal distention, pain, blood in stools, constipation, diarrhea, nausea, vomiting, or early satiety. Denies pain with intercourse, dysuria, frequency, hematuria or incontinence. Denies hot flashes, pelvic pain, vaginal bleeding or vaginal discharge.   Denies joint pain, back pain or muscle pain/cramps. Denies itching, rash, or wounds. Denies dizziness, headaches, numbness or seizures. Denies swollen lymph nodes or glands, denies easy bruising or bleeding. Denies anxiety, depression, confusion, or decreased concentration.  Physical Exam: BP 110/60 (BP Location: Left Arm,  Patient Position: Sitting)   Pulse 74   Temp 99 F (37.2 C) (Oral)   Resp 16   Ht 5\' 6"  (1.676 m)   Wt 131 lb (59.4 kg)   LMP 01/29/2024 (Approximate)   SpO2 100%   BMI 21.14 kg/m  General: Alert, oriented, no acute distress. HEENT: Normocephalic, atraumatic, sclera anicteric. Chest: Clear to auscultation bilaterally.  No wheezes or rhonchi. Cardiovascular: Regular rate and rhythm, no murmurs. Abdomen: soft, nontender.  Normoactive bowel sounds.  No masses or hepatosplenomegaly appreciated.  Well-healed incisions. Extremities: Grossly normal range of motion.  Warm, well perfused.  No edema bilaterally.  Skin: No rashes or lesions noted. Lymphatics: No cervical, supraclavicular, or inguinal adenopathy. GU: Normal appearing external genitalia without erythema, excoriation, or lesions.  Speculum exam reveals well rugated vaginal mucosa, no lesions or masses.  Cervix is normal in appearance, somewhat posterior facing. Bimanual exam reveals small mobile uterus, no adnexal masses appreciated, no nodularity.  Laboratory & Radiologic Studies: Component Ref Range & Units (hover) 7 d ago (02/23/24) 6 mo ago (08/19/23) 1 yr ago (02/25/23) 1 yr ago (08/25/22) 2 yr ago (01/11/22) 2 yr ago (08/10/21) 3 yr ago (02/13/21)  Inhibin B 122.3 88.2 CM 96.4 CM 8.7 CM 56.0 CM 32.7 CM 48.0 CM   Component Ref Range & Units (hover) 7 d ago (02/23/24) 6 mo ago (08/19/23) 1 yr ago (02/25/23) 1 yr ago (08/25/22) 2 yr ago (01/11/22) 2 yr ago (08/10/21) 3 yr ago (02/13/21)  ANTI-MULLERIAN HORMONE ([facility]) 1.21 1.39 CM 1.02 CM 1.33 CM 1.70 CM 0.471 CM 2.57 CM   Assessment & Plan: Kathryn Park is a 38 y.o. 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 Last ultrasound 08/2022: normal  right ovary. Pelvic ultrasound 08/2023: Normal uterus, endometrium 9 mm, normal-appearing right ovary with 1.9 cm dominant anechoic follicle.   Doing well.   Continues to struggle with ataxia and other symptoms suspected to be related to EDS.  No change in symptoms or in menstrual pattern since coming off progesterone.   We reviewed signs and symptoms again that would be concerning for disease recurrence.  Per [facility] surveillance recommendations, we will continue on visits every 6 months.  She knows to call if she develops any symptoms before her next scheduled visit.  I will plan to repeat labs at her next visit.  No routine imaging ordered at this time.  We discussed that typically, we would image for change in symptoms or tumor marker.  22 minutes of total time was spent for this patient encounter, including preparation, face-to-face counseling with the patient and coordination of care, and documentation of the encounter.  Wiley Hanger, MD  Division of Gynecologic Oncology  Department of Obstetrics and Gynecology  [facility]

## 2024-03-01 NOTE — Patient Instructions (Signed)
 It was good to see you today.  I do not see or feel any evidence of cancer recurrence on your exam.  I will see you for follow-up in 6 months.  As always, if you develop any new and concerning symptoms before your next visit, please call to see me sooner.

## 2024-03-07 NOTE — Therapy (Addendum)
 " OUTPATIENT OCCUPATIONAL THERAPY treatment & DISCHARGE note  Patient Name: Kathryn Park MRN: [id number] DOB:06-Mar-1986, 38 y.o., female Today's Date: 03/08/2024  PCP: Job RAMAN. PA REFERRING PROVIDER: Joane Artist RAMAN, MD                  OCCUPATIONAL THERAPY DISCHARGE SUMMARY  Visits from Start of Care: 2  Pt did not show up to additional OT visits and is officially D/C at this point.  Please see notes for any details about status, but unfortunately LTG's could not be finalized.   Melvenia Ada, OTR/L, CHT 10/25/24                END OF SESSION:  OT End of Session - 03/08/24 1433     Visit Number 2    Number of Visits 8    Date for OT Re-Evaluation 04/06/24    Authorization Type [employer]    OT Start Time 1434    OT Stop Time 1528    OT Time Calculation (min) 54 min    Activity Tolerance Patient tolerated treatment well;No increased pain;Patient limited by pain;Patient limited by fatigue    Behavior During Therapy [facility] for tasks assessed/performed              Past Medical History:  Diagnosis Date   Abnormal Pap smear of cervix    Allergy    Anemia    suspected poor diet; vegetarian during teens   Anxiety    Asthma    childhood   Depression    Dysmenorrhea    Ehlers-Danlos syndrome    Family history of breast cancer    Family history of kidney cancer    Granulosa cell tumor of ovary    History of iron deficiency anemia    age 76   Increased risk of breast cancer    23% lifetime risk   Joint pain    Labral tear of right hip joint    Leukocytosis    Low vitamin D  level    Migraine with aura    never on rx or saw neurology   Ovarian cancer (HCC) 2021   Ovarian cyst    Left   Pneumonia 01/2012    left lower lobe   Pre-diabetes 2015   had a borderline A1c   STD (sexually transmitted disease)    HSV1   Wears contact lenses    Wears glasses    Past Surgical History:  Procedure Laterality Date    ADENOIDECTOMY     BREAST BIOPSY Right 04/2020   benign fibroadenoma at 6:30   COLPOSCOPY     LAPAROSCOPIC OVARIAN CYSTECTOMY Left 11/13/2019   Procedure: LAPAROSCOPIC OVARIAN CYSTECTOMY/POSSIBLE LEFT OOPHORECTOMY/ COLLECTION OF PELVIC WASHINGS/ks;  Surgeon: Cathlyn JAYSON Nikki Bobie FORBES, MD;  Location: [facility];  Service: Gynecology;  Laterality: Left;  Possible left oophorectomy Colection of pelvic washings To follow first case of the day   LEFT OOPHORECTOMY Left 11/2019   right hip arthroscopy  01/2022   for laberal tear repair   TONSILLECTOMY     UPPER GI ENDOSCOPY     Patient Active Problem List   Diagnosis Date Noted   Dysautonomia (HCC) 03/15/2023   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 09/29/2022   Other adverse food reactions, not elsewhere classified, subsequent encounter 09/29/2022   Other allergic rhinitis 09/29/2022   Rash and other nonspecific skin eruption 09/29/2022   Pruritus 09/29/2022   Mild intermittent asthma without complication 09/29/2022   Gastroesophageal  reflux disease 09/29/2022   EDS (Ehlers-Danlos syndrome) 06/10/2022   Tear of right acetabular labrum 07/22/2021   Breast lump 02/12/2021   Bilateral hip pain 11/25/2020   Positive ANA (antinuclear antibody) 11/25/2020   Other fatigue 11/25/2020   Bladder retention 10/13/2020   HSV-1 (herpes simplex virus 1) infection 10/13/2020   History of anorexia nervosa 10/13/2020   GAD (generalized anxiety disorder) 10/13/2020   Family history of breast cancer    Family history of kidney cancer    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    ONSET DATE: DOI: 11/21/23  REFERRING DIAG: F20.358 (ICD-10-CM) - Right hand pain   THERAPY DIAG:  Muscle weakness (generalized)  Pain in right hand  Stiffness of right hand, not elsewhere classified  Rationale for Evaluation and Treatment: Rehabilitation  PERTINENT HISTORY: Per referral: Evaluate and Treat for R hand pain after 5th MC fx and shoulder  pain 1-2 times per week for 4-6 weeks.   Pt hit a door out of anger this morning, striking the door w/ the pinky side of her R hand. Pt locates pain to 5th finger, 5th MC. She notes a little bit of pain in the 3rd-4th fingers/MC    Per imaging:  mildly comminuted transverse fracture through the proximal aspect of the fifth metacarpal. There is mild apex posterior angulation. Distal fracture fragment is displaced posteriorly 3 mm. There is overlying soft tissue swelling. There is no dislocation.   Past Medical Hx includes cervical CA and she wears a K-N95 today.  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  She wore a cast for 3 weeks, and transitioned to a buddy strap that she couldn't tolerate and d/c'd quickly.   Now she has some some crossing over, Also states hx of OA in her fingers/thumb and hx of EDS. She also has multiple falls in a day that present as tightness in her back that causes her body to draw tightly and lose balance.    PRECAUTIONS: Fall and Other: latex allergies.  For fall risk, OT will use close approximation and she is following up with [facility] for an ataxia clinic which will work on her balance as well.   RED FLAGS: None   WEIGHT BEARING RESTRICTIONS: No    SUBJECTIVE:   SUBJECTIVE STATEMENT: She states the arthritis symptoms that she complained about in her thumbs mainly seem to be well-controlled by exercises now, but in general her pain is exacerbated by the rainy weather.  She does state that her right shoulder continues to be sharp and painful limiting factor to the use of her right arm.      PAIN:  Are you having pain? Yes: NPRS scale: Does not provide a pain rating today but does state her hands are less painful now, but right shoulder continues to bother her with poor weather and movement Pain location: Right hand right hand  small finger and metacarpal area also thumb CMC joints Pain description: Aching and sore Aggravating factors: Tight gripping Relieving factors: Rest  FALLS: Has patient fallen in last 6 months? Yes. Number of falls she reports multiple falls a day at times.  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  She will be following up with [facility] for this issue and has had multiple MRIs, though there is no consensus  on why she has these balance and cramping issues now  LIVING ENVIRONMENT: Lives with: lives with their family Lives in: House/apartment Has following equipment at home: Single point cane and possibly others  PLOF: Independent with basic ADLs, Independent with household mobility without device, Independent with community mobility with device, and Needs assistance with homemaking  PATIENT GOALS: To improve pain in the right hand due to the small finger fracture and crossing over, and also learn strategies to deal with hand arthritis and pain.  She would also like to work on right shoulder pain  NEXT MD VISIT: As needed with Dr. Joane  OBJECTIVE: (All objective assessments below are from initial evaluation on: 02/21/2024 unless otherwise specified.)    HAND DOMINANCE: Right   ADLs: Overall ADLs: States decreased ability to grab, hold household objects, pain and difficulty to open containers, perform FMS tasks (manipulate fasteners on clothing)   FUNCTIONAL OUTCOME MEASURES: Eval: Patient Specific Functional Scale: 3 (Writing, computer work, food prep)  (Higher Score  =  Better Ability for the Selected Tasks)       UPPER EXTREMITY ROM     Shoulder to Wrist AROM Right eval  Shoulder flexion Approximately 165 degrees  Shoulder abduction   Shoulder extension   Shoulder internal rotation Approximately 60 degrees  Shoulder external rotation Approximately 85 degrees  Elbow flexion Within normal limits  Elbow  extension Within normal limits  Forearm supination   Forearm pronation    Wrist flexion 85  Wrist extension 75  Wrist ulnar deviation   Wrist radial deviation   Functional dart thrower's motion (F-DTM) in ulnar flexion   F-DTM in radial extension    (Blank rows = not tested)   Hand AROM Right eval Rt 03/08/24  Full Fist Ability (or Gap to Distal Palmar Crease) full   Thumb Opposition  (Kapandji Scale)  10/10   Thumb MCP (0-60)    Thumb IP (0-80)    Thumb Radial Abduction Span    Thumb Palmar Abduction Span    Index MCP (0-90)    Index PIP (0-100)    Index DIP (0-70)     Long MCP (0-90)     Long PIP (0-100)     Long DIP (0-70)     Ring MCP (0-90)     Ring PIP (0-100)     Ring DIP (0-70)     Little MCP (0-90)  0 - 89 0 - 77  Little PIP (0-100)  0 - 96 0 - 95  Little DIP (0-70)  (+3) - 75 0 - 82  (Blank rows = not tested)   HAND FUNCTION: 03/08/24: Grip: Rt: 46#   Eval: Observed weakness in affected Rt hand.  Grip strength Right: 44 lbs, Left: 64 lbs   COORDINATION: Eval: No issues noted today for fine motor skills, she does have some instability with gross motor skills, she complains about pain with writing and other coordinated activities  OBSERVATIONS:   Eval: She has a swollen lump over the base of the dorsum of the fifth metacarpal of the right hand, she is largely nontender now, she has some radial rotation at the MCP joint causing some scissoring or crossing over but this is passively flexible today.  When she makes a fist she does not always scissoring or crossover.  She does have some intrinsic tightness in the right hand  She is able to make her right shoulder grind and pop in strange ways if she sits on her hand and internal  rotation and flexes her scapula.  She was told to not do this and not try to cause problems or pain or tearing around her arm or shoulder due to her hyperflexibility.  Overall, she seems to have some scapular weakness and instability which is  typical with EDS but also some tightness with internal rotation.  Lastly she also appears to have some CMC joint thumb arthritis bilaterally worse in the right hand.  We can work on this as well with recommendations and exercises.   TODAY'S TREATMENT:  03/08/2024: We review her home exercise program for the small finger fracture recovery as well as for management of hand and thumb arthritis that is exacerbated by Ehlers-Danlos syndrome.  She tolerates this review very well, having no significant pain with passive stretches at the small finger, stating understanding thumb stretches and isometric towel roll gripping, etc.  OT upgrades her home exercises to include therapy putty activities for pinching in a stable position, composite fisting, and FDP strengthening for intrinsic tightness.  She tolerates these well without significant pain.    Lastly, OT tries to help her address her right neck/shoulder/clavicular pain by giving the bolded exercises below.  She does seem to have tightness into internal rotation (tightness in the external rotators), tightness of the chest with forward shoulder posture, and general weakness/instability of the shoulder girdle due to Ehlers-Danlos syndrome.    She tolerates isometric strengthening for scapular retraction and shoulder external rotation very well, but when trying to stretch the chest/clavicular area gently, she has difficulty proceeding or finding a good stretch.  We tried several ways and standing, and in supine.  She seems to become frustrated and states sharp pains through the upper trap and scalene muscles of her neck.  OT suggest doing gentle lateral neck flexion to stretch these areas and she states I do that.  She seems visibly frustrated and appears to stop following OT directions at this point.  OT decides to finish up the session there, encourages her to try to find nonpainful stretches and do gentle strengthening without pain, as all humans can do this if  they are being cautious and thoughtful.  It is encouraging that her hand issues including fracture and arthritis are feeling better.  She did lose her balance several times today but OT was there and guarding her to prevent any injury or accident.   Exercises/activities (new or bolded) - Bend and Pull Back Wrist SLOWLY  - 3-4 x daily - 10-15 reps - Tendon Glides  - 3-4 x daily - 10-15 reps - Finger Spreading  - 3-4 x daily - 10-15 reps - 5 sec hold - Seated Finger Composite Flexion Stretch  - 4 x daily - 3-5 reps - 15 hold - Stretch thumb toward base of small finger (put hand in LAP)  - 2-3 x daily - 3-5 reps - 15 sec hold - Thumb Webspace Stretch  - 3 x daily - 3 reps - 15 sec hold - Towel Roll Grip with Forearm in Neutral  - 3 x daily - 5 reps - 10 sec hold - Putty activities in Full Fist  - 2-3 x daily - 5 reps - 10 sec hold - Thumb Opposition with Putty  - 2-3 x daily - 5 reps - Seated Claw Fist with Putty  - 2-3 x daily - 5 reps - Doorway Stretches (both arms low, lean in gently)   - 3-4 x daily - 3-5 reps - 15 hold - Engineer, [employer]  -  3-4 x daily - 5 reps - 15-20 sec hold - Standing Shoulder Row with Anchored Resistance  - 2-4 x daily - 1-2 sets - 10-15 reps - Shoulder External Rotation with Anchored Resistance  - 4-6 x daily - 1 sets - 10-15 reps   PATIENT EDUCATION: Education details: See tx section above for details  Person educated: Patient Education method: Verbal Instruction, Teach back, Handouts  Education comprehension: States and demonstrates understanding, Additional Education required    HOME EXERCISE PROGRAM: Access Code: [email or web address] URL: [email or web address] Date: 02/21/2024 Prepared by: Melvenia Ada   GOALS: Goals reviewed with patient? Yes   SHORT TERM GOALS: (STG required if POC>30 days) Target Date: 03/09/2024  Pt will obtain protective, custom orthotic. Goal status: TBD/PRN  2.  Pt will demo/state understanding of initial HEP to  improve pain levels and prerequisite motion. Goal status: 03/08/2024: Goal met   LONG TERM GOALS: Target Date: 04/06/2024  Pt will improve functional ability by decreased impairment per PSFS assessment from 3 to 6 or better, for better quality of life. Goal status: INITIAL  2.  Pt will improve grip strength in right hand from 44 lbs to at least 50 to lbs for functional use at home and in IADLs. Goal status: INITIAL  3.  Pt will improve A/ROM in right shoulder internal rotation from approx 60 degrees to approx 70 degrees, to have functional motion for tasks like reach and grasp.  Goal status: INITIAL  4.  Pt will improve strength in right scapula to improve dynamic scapular motion, reduce pain and symptoms.   Goal status: INITIAL  5.  Pt will improve coordination skills in right hand by her stated ability for better writing ability with less pain and problems.  Goal status: INITIAL  6.  Pt will decrease pain at worst from 7-8/10 to 3/10 or better to have better sleep and occupational participation in daily roles. Goal status: INITIAL   ASSESSMENT:  CLINICAL IMPRESSION: 03/08/2024: Fortunately she is doing very well with her small finger, thumbs, hand symptoms and complaints.  Trying to address her chronic shoulder problem was quite difficult, and she became visibly, mildly agitated stating I do not understand what I am supposed to feel.  She had difficulty describing her pain or whether she was feeling stretches or pain.  This seems to be something like a centralized or chronic pain syndrome that makes it difficult for her to understand what she is feeling and perceiving.    PLAN:  OT FREQUENCY: 1-2x/week  OT DURATION: 6 weeks through 04/06/2024 and up to 8 total visits as needed  PLANNED INTERVENTIONS: 97535 self care/ADL training, 02889 therapeutic exercise, 97530 therapeutic activity, 97112 neuromuscular re-education, 97140 manual therapy, 97760 Orthotic Initial, [id number]  Orthotic/Prosthetic subsequent, Dry needling, coping strategies training, patient/family education, and DME and/or AE instructions  RECOMMENDED OTHER SERVICES: None now that she will be following up with other medical providers for ataxia, poor gait, other issues  CONSULTED AND AGREED WITH PLAN OF CARE: Patient  PLAN FOR NEXT SESSION:   We will likely not focus on shoulder chronic symptoms as this agitated her somewhat.  Will focus on finalizing a plan for maintenance of Ehlers-Danlos/arthritis symptoms as well as recovering from small finger fracture.  If she would like to work on shoulder girdle issues we can, but OT will also consider recommending her to a mental health specialist as she seems to have some overt anxiety and has an obviously low frustration tolerance.   Melvenia Ada, OTR/L, CHT  03/08/2024, 4:43 PM   "

## 2024-03-08 ENCOUNTER — Ambulatory Visit (INDEPENDENT_AMBULATORY_CARE_PROVIDER_SITE_OTHER): Admitting: Rehabilitative and Restorative Service Providers"

## 2024-03-08 ENCOUNTER — Encounter: Payer: Self-pay | Admitting: Rehabilitative and Restorative Service Providers"

## 2024-03-08 DIAGNOSIS — M79641 Pain in right hand: Secondary | ICD-10-CM

## 2024-03-08 DIAGNOSIS — M6281 Muscle weakness (generalized): Secondary | ICD-10-CM | POA: Diagnosis not present

## 2024-03-08 DIAGNOSIS — M25641 Stiffness of right hand, not elsewhere classified: Secondary | ICD-10-CM

## 2024-03-08 NOTE — Therapy (Incomplete)
 OUTPATIENT OCCUPATIONAL THERAPY treatment note  Patient Name: Kathryn Park MRN: [id number] DOB:October 24, 1985, 38 y.o., female Today's Date: 03/08/2024  PCP: Dulcie Gibes. PA REFERRING PROVIDER: [name], MD   END OF SESSION:     Past Medical History:  Diagnosis Date   Abnormal Pap smear of cervix    Allergy    Anemia    suspected poor diet; vegetarian during teens   Anxiety    Asthma    childhood   Depression    Dysmenorrhea    Ehlers-Danlos syndrome    Family history of breast cancer    Family history of kidney cancer    Granulosa cell tumor of ovary    History of iron deficiency anemia    age 18   Increased risk of breast cancer    23% lifetime risk   Joint pain    Labral tear of right hip joint    Leukocytosis    Low vitamin D  level    Migraine with aura    never on rx or saw neurology   Ovarian cancer (HCC) 2021   Ovarian cyst    Left   Pneumonia 01/2012    left lower lobe   Pre-diabetes 2015   had a "borderline" A1c   STD (sexually transmitted disease)    HSV1   Wears contact lenses    Wears glasses    Past Surgical History:  Procedure Laterality Date   ADENOIDECTOMY     BREAST BIOPSY Right 04/2020   benign fibroadenoma at 6:30   COLPOSCOPY     LAPAROSCOPIC OVARIAN CYSTECTOMY Left 11/13/2019   Procedure: LAPAROSCOPIC OVARIAN CYSTECTOMY/POSSIBLE LEFT OOPHORECTOMY/ COLLECTION OF PELVIC WASHINGS/ks;  Surgeon: [name], MD;  Location: [facility];  Service: Gynecology;  Laterality: Left;  Possible left oophorectomy Colection of pelvic washings To follow first case of the day   LEFT OOPHORECTOMY Left 11/2019   right hip arthroscopy  01/2022   for laberal tear repair   TONSILLECTOMY     UPPER GI ENDOSCOPY     Patient Active Problem List   Diagnosis Date Noted   Dysautonomia (HCC) 03/15/2023   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 09/29/2022   Other adverse food reactions, not elsewhere classified,  subsequent encounter 09/29/2022   Other allergic rhinitis 09/29/2022   Rash and other nonspecific skin eruption 09/29/2022   Pruritus 09/29/2022   Mild intermittent asthma without complication 09/29/2022   Gastroesophageal reflux disease 09/29/2022   EDS (Ehlers-Danlos syndrome) 06/10/2022   Tear of right acetabular labrum 07/22/2021   Breast lump 02/12/2021   Bilateral hip pain 11/25/2020   Positive ANA (antinuclear antibody) 11/25/2020   Other fatigue 11/25/2020   Bladder retention 10/13/2020   HSV-1 (herpes simplex virus 1) infection 10/13/2020   History of anorexia nervosa 10/13/2020   GAD (generalized anxiety disorder) 10/13/2020   Family history of breast cancer    Family history of kidney cancer    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    ONSET DATE: DOI: 11/21/23  REFERRING DIAG: W09.811 (ICD-10-CM) - Right hand pain   THERAPY DIAG:  No diagnosis found.  Rationale for Evaluation and Treatment: Rehabilitation  PERTINENT HISTORY: Per referral: "Evaluate and Treat for R hand pain after 5th MC fx and shoulder pain 1-2 times per week for 4-6 weeks."   "Pt hit a door out of anger this morning, striking the door w/ the pinky side of her R hand. Pt locates pain to 5th finger, 5th  MC. She notes a little bit of pain in the 3rd-4th fingers/MC "   Per imaging: " mildly comminuted transverse fracture through the proximal aspect of the fifth metacarpal. There is mild apex posterior angulation. Distal fracture fragment is displaced posteriorly 3 mm. There is overlying soft tissue swelling. There is no dislocation."   Past Medical Hx includes cervical CA and she wears a K-N95 today.  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  She wore a cast for 3 weeks, and transitioned to a buddy strap that she couldn't tolerate and d/c'd quickly.   Now she has some  some crossing over, Also states hx of OA in her fingers/thumb and hx of EDS. She also has multiple falls in a day that present as tightness in her back that causes her body to draw tightly and lose balance.    PRECAUTIONS: Fall and Other: latex allergies.  For fall risk, OT will use close approximation and she is following up with [facility] for an "ataxia" clinic which will work on her balance as well.   RED FLAGS: None   WEIGHT BEARING RESTRICTIONS: No    SUBJECTIVE:   SUBJECTIVE STATEMENT: She states ***    the arthritis symptoms that she complained about in her thumbs mainly seem to be well-controlled by exercises now, but in general her pain is exacerbated by the rainy weather.  She does state that her right shoulder continues to be "sharp and painful" limiting factor to the use of her right arm.      PAIN:  Are you having pain? Yes: NPRS scale: ***/10 Pain location: Right hand right hand small finger and metacarpal area also thumb CMC joints Pain description: Aching and sore Aggravating factors: Tight gripping Relieving factors: Rest  FALLS: Has patient fallen in last 6 months? Yes. Number of falls she reports multiple falls a day at times.  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  She will be following up with [facility] for this issue and has had multiple MRIs, though there is no consensus on why she has these balance and cramping issues now  LIVING ENVIRONMENT: Lives with: lives with their family Lives in: House/apartment Has following equipment at home: Single point cane and possibly others  PLOF: Independent with basic ADLs, Independent with household mobility without device, Independent with community mobility with device, and Needs assistance with homemaking  PATIENT GOALS: To improve pain in the right hand due to the small finger fracture and crossing over, and also learn strategies to deal with  hand arthritis and pain.  She would also like to work on right shoulder pain  NEXT MD VISIT: As needed with Dr. [name]  OBJECTIVE: (All objective assessments below are from initial evaluation on: 02/21/2024 unless otherwise specified.)    HAND DOMINANCE: Right   ADLs: Overall ADLs: States decreased ability to grab, hold household objects, pain and difficulty to open containers, perform FMS tasks (manipulate fasteners on clothing)   FUNCTIONAL OUTCOME MEASURES: Eval: Patient Specific Functional Scale: 3 (Writing, computer work, food prep)  (Higher Score  =  Better Ability for the Selected Tasks)       UPPER EXTREMITY ROM     Shoulder to Wrist AROM Right eval  Shoulder flexion Approximately 165 degrees  Shoulder abduction   Shoulder extension  Shoulder internal rotation Approximately 60 degrees  Shoulder external rotation Approximately 85 degrees  Elbow flexion Within normal limits  Elbow extension Within normal limits  Forearm supination   Forearm pronation    Wrist flexion 85  Wrist extension 75  Wrist ulnar deviation   Wrist radial deviation   Functional dart thrower's motion (F-DTM) in ulnar flexion   F-DTM in radial extension    (Blank rows = not tested)   Hand AROM Right eval Rt 03/08/24  Full Fist Ability (or Gap to Distal Palmar Crease) full   Thumb Opposition  (Kapandji Scale)  10/10   Thumb MCP (0-60)    Thumb IP (0-80)    Thumb Radial Abduction Span    Thumb Palmar Abduction Span    Index MCP (0-90)    Index PIP (0-100)    Index DIP (0-70)     Long MCP (0-90)     Long PIP (0-100)     Long DIP (0-70)     Ring MCP (0-90)     Ring PIP (0-100)     Ring DIP (0-70)     Little MCP (0-90)  0 - 89 0 - 77  Little PIP (0-100)  0 - 96 0 - 95  Little DIP (0-70)  (+3) - 75 0 - 82  (Blank rows = not tested)   HAND FUNCTION: 03/08/24: Grip: Rt: 46#   Eval: Observed weakness in affected Rt hand.  Grip strength Right: 44 lbs, Left: 64 lbs    COORDINATION: Eval: No issues noted today for fine motor skills, she does have some instability with gross motor skills, she complains about pain with writing and other coordinated activities  OBSERVATIONS:   Eval: She has a swollen lump over the base of the dorsum of the fifth metacarpal of the right hand, she is largely nontender now, she has some radial rotation at the MCP joint causing some scissoring or crossing over but this is passively flexible today.  When she makes a fist she does not always scissoring or crossover.  She does have some intrinsic tightness in the right hand  She is able to make her right shoulder grind and pop in strange ways if she sits on her hand and internal rotation and flexes her scapula.  She was told to not do this and not try to cause problems or pain or tearing around her arm or shoulder due to her hyperflexibility.  Overall, she seems to have some scapular weakness and instability which is typical with EDS but also some tightness with internal rotation.  Lastly she also appears to have some CMC joint thumb arthritis bilaterally worse in the right hand.  We can work on this as well with recommendations and exercises.   TODAY'S TREATMENT:  03/12/24: *** We will likely not focus on shoulder chronic symptoms as this agitated her somewhat.  Will focus on finalizing a plan for maintenance of Ehlers-Danlos/arthritis symptoms as well as recovering from small finger fracture.  If she would like to work on shoulder girdle issues we can, but OT will also consider recommending her to a mental health specialist as she seems to have some overt anxiety and has an obviously low frustration tolerance.     03/08/2024: We review her home exercise program for the small finger fracture recovery as well as for management of hand and thumb arthritis that is exacerbated by Ehlers-Danlos syndrome.  She tolerates this review very well, having no significant pain with passive stretches at  the small finger, stating understanding thumb stretches and isometric towel roll gripping, etc.  OT upgrades her home exercises to include therapy putty activities for pinching in a stable position, composite fisting, and FDP strengthening for intrinsic tightness.  She tolerates these well without significant pain.    Lastly, OT tries to help her address her right neck/shoulder/clavicular pain by giving the bolded exercises below.  She does seem to have tightness into internal rotation (tightness in the external rotators), tightness of the chest with forward shoulder posture, and general weakness/instability of the shoulder girdle due to Ehlers-Danlos syndrome.    She tolerates isometric strengthening for scapular retraction and shoulder external rotation very well, but when trying to stretch the chest/clavicular area gently, she has difficulty proceeding or finding a good stretch.  We tried several ways and standing, and in supine.  She seems to become frustrated and states sharp pains through the upper trap and scalene muscles of her neck.  OT suggest doing gentle lateral neck flexion to stretch these areas and she states "I do that."  She seems visibly frustrated and appears to stop following OT directions at this point.  OT decides to finish up the session there, encourages her to try to find nonpainful stretches and do gentle strengthening without pain, as all humans can do this if they are being cautious and thoughtful.  It is encouraging that her hand issues including fracture and arthritis are feeling better.  She did lose her balance several times today but OT was there and guarding her to prevent any injury or accident.   Exercises/activities (new or bolded) - Bend and Pull Back Wrist SLOWLY  - 3-4 x daily - 10-15 reps - Tendon Glides  - 3-4 x daily - 10-15 reps - Finger Spreading  - 3-4 x daily - 10-15 reps - 5 sec hold - Seated Finger Composite Flexion Stretch  - 4 x daily - 3-5 reps - 15  hold - Stretch thumb toward base of small finger (put hand in LAP)  - 2-3 x daily - 3-5 reps - 15 sec hold - Thumb Webspace Stretch  - 3 x daily - 3 reps - 15 sec hold - Towel Roll Grip with Forearm in Neutral  - 3 x daily - 5 reps - 10 sec hold - Putty activities in Full Fist  - 2-3 x daily - 5 reps - 10 sec hold - Thumb Opposition with Putty  - 2-3 x daily - 5 reps - Seated Claw Fist with Putty  - 2-3 x daily - 5 reps - Doorway Stretches (both arms low, lean in gently)   - 3-4 x daily - 3-5 reps - 15 hold - Sleeper Stretch  - 3-4 x daily - 5 reps - 15-20 sec hold - Standing Shoulder Row with Anchored Resistance  - 2-4 x daily - 1-2 sets - 10-15 reps - Shoulder External Rotation with Anchored Resistance  - 4-6 x daily - 1 sets - 10-15 reps   PATIENT EDUCATION: Education details: See tx section above for details  Person educated: Patient Education method: Verbal Instruction, Teach back, Handouts  Education comprehension: States and demonstrates understanding, Additional Education required    HOME EXERCISE PROGRAM: Access Code: [email or web address] URL: [email or web address] Date: 02/21/2024 Prepared by: Leartis Proud   GOALS: Goals reviewed with patient? Yes   SHORT TERM GOALS: (STG required if POC>30 days) Target Date: 03/09/2024  Pt will obtain protective, custom orthotic. Goal status: TBD/PRN  2.  Pt will demo/state understanding  of initial HEP to improve pain levels and prerequisite motion. Goal status: 03/08/2024: Goal met   LONG TERM GOALS: Target Date: 04/06/2024  Pt will improve functional ability by decreased impairment per PSFS assessment from 3 to 6 or better, for better quality of life. Goal status: INITIAL  2.  Pt will improve grip strength in right hand from 44 lbs to at least 50 to lbs for functional use at home and in IADLs. Goal status: INITIAL  3.  Pt will improve A/ROM in right shoulder internal rotation from approx 60 degrees to approx 70 degrees,  to have functional motion for tasks like reach and grasp.  Goal status: INITIAL  4.  Pt will improve strength in right scapula to improve dynamic scapular motion, reduce pain and symptoms.   Goal status: INITIAL  5.  Pt will improve coordination skills in right hand by her stated ability for better writing ability with less pain and problems.  Goal status: INITIAL  6.  Pt will decrease pain at worst from 7-8/10 to 3/10 or better to have better sleep and occupational participation in daily roles. Goal status: INITIAL   ASSESSMENT:  CLINICAL IMPRESSION: 03/12/24: ***  03/08/2024: Fortunately she is doing very well with her small finger, thumbs, hand symptoms and complaints.  Trying to address her chronic shoulder problem was quite difficult, and she became visibly, mildly agitated stating "I do not understand what I am supposed to feel."  She had difficulty describing her pain or whether she was feeling stretches or pain.  This seems to be something like a centralized or chronic pain syndrome that makes it difficult for her to understand what she is feeling and perceiving.    PLAN:  OT FREQUENCY: 1-2x/week  OT DURATION: 6 weeks through 04/06/2024 and up to 8 total visits as needed  PLANNED INTERVENTIONS: 97535 self care/ADL training, 96045 therapeutic exercise, 97530 therapeutic activity, 97112 neuromuscular re-education, 97140 manual therapy, 97760 Orthotic Initial, 97763 Orthotic/Prosthetic subsequent, Dry needling, coping strategies training, patient/family education, and DME and/or AE instructions  RECOMMENDED OTHER SERVICES: None now that she will be following up with other medical providers for ataxia, poor gait, other issues  CONSULTED AND AGREED WITH PLAN OF CARE: Patient  PLAN FOR NEXT SESSION:   ***   Leartis Proud, OTR/L, CHT 03/08/2024, 5:00 PM

## 2024-03-09 ENCOUNTER — Encounter: Admitting: Physical Therapy

## 2024-03-11 NOTE — Progress Notes (Unsigned)
 Follow Up Note  RE: Kathryn Park MRN: [id number] DOB: [birth year] Date of Office Visit: 03/12/2024  Referring provider: Alexander Iba, PA Primary care provider: Alexander Iba, PA  Chief Complaint: No chief complaint on file.  History of Present Illness: I had the pleasure of seeing Kathryn Park for a follow up visit at the [facility] on 03/11/2024. She is a 38 y.o. female, who is being followed for allergic rhinitis, asthma, GERD, adverse reaction, rash. Her previous allergy office visit was on 12/01/2022 with Dr. Burdette Carolin. Today is a regular follow up visit.  Discussed the use of AI scribe software for clinical note transcription with the patient, who gave verbal consent to proceed.  History of Present Illness            2024 labs: "I received your results back from the sendout lab.  Good news is that it came back negative. You don't have hereditary alpha-tryptasemia.  "  Assessment and Plan: Kathryn Park is a 38 y.o. female with: 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 Past history - Diagnosed with Karole Pacer and concerned about mast cell activation. Patient presents with a constellation of symptoms. No prior work up for this. Interim history - 2023 bloodwork (ANA, alpha gal, CBC diff, CU, C3, C4, CMP, crp, ESR) all unremarkable. Tryptase level 10.9. Consider referral to [facility] allergy - Dr. Samul Croft I. Authur Leghorn, MD, PhD. Consider getting HAT test - it is $169. Patient interested in getting this done today.  Swab sample collected in the office and mailed out.  This will take about 6-8 weeks for results to get back.    Other allergic rhinitis Past history - Worsening rhino conjunctivitis symptoms. Took OTC antihistamines with some benefit. Flonase  caused epistaxis. No prior AIT. 2023 skin testing showed: Positive to dust mites, perennial mold, mouse. Borderline to cat.  Interim history - dry nares. Continue environmental control  measures as below. Use over the counter antihistamines such as Zyrtec (cetirizine), Allegra (fexofenadine), or Xyzal (levocetirizine) daily as needed. May take twice a day during allergy flares. May switch antihistamines every few months. Use saline nasal spray.    Mild intermittent asthma without complication Past history - Diagnosed with asthma as a child. Not sure what type of inhalers she tried in the past. Covid-19 in 2023. 2023 spirometry showed: normal pattern with 35% improvement in FEV1 post bronchodilator treatment. Clinically feeling slightly improved.  Interim history - doing better with Flovent . Some issues with cost of ICS inhalers as she hasn't met deductible yet. Normal spirometry today - better than prior one.  Daily controller medication(s): continue Flovent  110mcg 2 puffs twice a day with spacer and rinse mouth afterwards. Prior to physical activity: May use albuterol  rescue inhaler 2 puffs 5 to 15 minutes prior to strenuous physical activities. Rescue medications: May use albuterol  rescue inhaler 2 puffs  every 4 to 6 hours as needed for shortness of breath, chest tightness, coughing, and wheezing. Monitor frequency of use.    Gastroesophageal reflux disease Improved with famotidine .  Monitor symptoms. Restart famotidine  20mg  twice a day.    Other adverse food reactions, not elsewhere classified, subsequent encounter Past history - Throat closing after eating crab over 30 years go - unsure of specific details. Rash with watermelon. No recent work up. 2023 skin testing showed: Negative to select foods.  Interim history - 2023 bloodwork negative to shellfish and watermelon.  Continue to avoid shellfish and watermelon. If interested we can schedule food challenge to  watermelon.  For mild symptoms you can take over the counter antihistamines such as Benadryl and monitor symptoms closely. If symptoms worsen or if you have severe symptoms including breathing issues, throat  closure, significant swelling, whole body hives, severe diarrhea and vomiting, lightheadedness then inject epinephrine  and seek immediate medical care afterwards.   Rash and other nonspecific skin eruption Past history - Pruritus and rash started many years ago. No specific triggers noted. Worse after showering.  Interim history - only has occasional episodes.  Continue proper skin care. Take a daily antihistamine.  Avoid the following potential triggers: alcohol, tight clothing, NSAIDs, hot showers and getting overheated. Assessment and Plan              No follow-ups on file.  No orders of the defined types were placed in this encounter.  Lab Orders  No laboratory test(s) ordered today    Diagnostics: Spirometry:  Tracings reviewed. Her effort: {Blank single:19197::"Good reproducible efforts.","It was hard to get consistent efforts and there is a question as to whether this reflects a maximal maneuver.","Poor effort, data can not be interpreted."} FVC: ***L FEV1: ***L, ***% predicted FEV1/FVC ratio: ***% Interpretation: {Blank single:19197::"Spirometry consistent with mild obstructive disease","Spirometry consistent with moderate obstructive disease","Spirometry consistent with severe obstructive disease","Spirometry consistent with possible restrictive disease","Spirometry consistent with mixed obstructive and restrictive disease","Spirometry uninterpretable due to technique","Spirometry consistent with normal pattern","No overt abnormalities noted given today's efforts"}.  Please see scanned spirometry results for details.  Skin Testing: {Blank single:19197::"Select foods","Environmental allergy panel","Environmental allergy panel and select foods","Food allergy panel","None","Deferred due to recent antihistamines use"}. *** Results discussed with patient/family.   Medication List:  Current Outpatient Medications  Medication Sig Dispense Refill   albuterol  (VENTOLIN  HFA)  108 (90 Base) MCG/ACT inhaler INHALE 2 PUFFS BY MOUTH EVERY 4 HOURS AS NEEDED FOR WHEEZING OR SHORTNESS OF BREATH (COUGHING FITS) 18 g 0   beclomethasone (QVAR) 80 MCG/ACT inhaler Inhale 2 puffs into the lungs 2 (two) times daily. Rinse mouth after each use. 1 each 5   cyanocobalamin  1000 MCG tablet Take 1,000 mcg by mouth daily.     EPINEPHrine  0.3 mg/0.3 mL IJ SOAJ injection Inject 0.3 mg into the muscle as needed for anaphylaxis. 2 each 1   escitalopram  (LEXAPRO ) 20 MG tablet Take 1/2 tablet (10mg ) tablet by mouth daily for 2 weeks out of cycle 45 tablet 3   famotidine  (PEPCID ) 20 MG tablet Take 1 tablet (20 mg total) by mouth 2 (two) times daily. 60 tablet 3   fexofenadine (ALLEGRA) 180 MG tablet Take 180 mg by mouth in the morning and at bedtime.     magnesium oxide (MAG-OX) 400 (240 Mg) MG tablet Take 400 mg by mouth daily.     Melatonin 10 MG TABS Take 1 tablet by mouth at bedtime.     ondansetron  (ZOFRAN ) 4 MG tablet Take 1 tablet (4 mg total) by mouth 3 (three) times daily as needed for nausea or vomiting. 30 tablet 1   QUERCETIN PO Take 1 capsule by mouth daily in the afternoon.     Turmeric 500 MG CAPS Take 1 capsule by mouth daily in the afternoon.     valACYclovir  (VALTREX ) 1000 MG tablet Take 2 tablets (2000 mg) by mouth twice a day for 24 hours as needed for cold sore. 30 tablet 1   No current facility-administered medications for this visit.   Allergies: Allergies  Allergen Reactions   Shellfish Allergy Anaphylaxis    Throat closes   Other Other (See  Comments)    Watermelon, mold/mildew, pet dander, dust and dust mites  Swelling of eyes and congestion   Latex Rash   Sulfa Antibiotics Rash   I reviewed her past medical history, social history, family history, and environmental history and no significant changes have been reported from her previous visit.  Review of Systems  Constitutional:  Negative for appetite change, chills, fever and unexpected weight change.  HENT:   Negative for congestion and rhinorrhea.   Eyes:  Negative for itching.  Respiratory:  Negative for cough, chest tightness, shortness of breath and wheezing.   Cardiovascular:  Negative for chest pain.  Gastrointestinal:  Negative for abdominal pain and vomiting.  Skin:  Positive for rash.       pruritus  Allergic/Immunologic: Positive for environmental allergies.    Objective: There were no vitals taken for this visit. There is no height or weight on file to calculate BMI. Physical Exam Vitals and nursing note reviewed.  Constitutional:      Appearance: Normal appearance. She is well-developed.  HENT:     Head: Normocephalic and atraumatic.     Right Ear: Tympanic membrane and external ear normal.     Left Ear: Tympanic membrane and external ear normal.     Nose: Nose normal.     Mouth/Throat:     Mouth: Mucous membranes are moist.     Pharynx: Oropharynx is clear.  Eyes:     Conjunctiva/sclera: Conjunctivae normal.  Cardiovascular:     Rate and Rhythm: Normal rate and regular rhythm.     Heart sounds: Normal heart sounds. No murmur heard.    No friction rub. No gallop.  Pulmonary:     Effort: Pulmonary effort is normal.     Breath sounds: Normal breath sounds. No wheezing, rhonchi or rales.  Musculoskeletal:     Cervical back: Neck supple.  Skin:    General: Skin is warm.     Findings: No rash.  Neurological:     Mental Status: She is alert and oriented to person, place, and time.  Psychiatric:        Behavior: Behavior normal.    Previous notes and tests were reviewed. The plan was reviewed with the patient/family, and all questions/concerned were addressed.  It was my pleasure to see Kathryn Park today and participate in her care. Please feel free to contact me with any questions or concerns.  Sincerely,  Eudelia Hero, DO Allergy & Immunology  [facility]  [place] office: [phone] [facility] office: [phone]

## 2024-03-12 ENCOUNTER — Encounter: Payer: Self-pay | Admitting: Allergy

## 2024-03-12 ENCOUNTER — Ambulatory Visit (INDEPENDENT_AMBULATORY_CARE_PROVIDER_SITE_OTHER): Admitting: Allergy

## 2024-03-12 ENCOUNTER — Encounter: Admitting: Rehabilitative and Restorative Service Providers"

## 2024-03-12 ENCOUNTER — Other Ambulatory Visit: Payer: Self-pay

## 2024-03-12 VITALS — BP 120/68 | HR 64 | Temp 98.2°F | Resp 18 | Ht 66.0 in | Wt 128.3 lb

## 2024-03-12 DIAGNOSIS — J3089 Other allergic rhinitis: Secondary | ICD-10-CM

## 2024-03-12 DIAGNOSIS — R21 Rash and other nonspecific skin eruption: Secondary | ICD-10-CM

## 2024-03-12 DIAGNOSIS — R198 Other specified symptoms and signs involving the digestive system and abdomen: Secondary | ICD-10-CM

## 2024-03-12 DIAGNOSIS — J4530 Mild persistent asthma, uncomplicated: Secondary | ICD-10-CM | POA: Diagnosis not present

## 2024-03-12 DIAGNOSIS — T781XXD Other adverse food reactions, not elsewhere classified, subsequent encounter: Secondary | ICD-10-CM | POA: Diagnosis not present

## 2024-03-12 DIAGNOSIS — J4520 Mild intermittent asthma, uncomplicated: Secondary | ICD-10-CM

## 2024-03-12 DIAGNOSIS — K219 Gastro-esophageal reflux disease without esophagitis: Secondary | ICD-10-CM

## 2024-03-12 MED ORDER — ALBUTEROL SULFATE HFA 108 (90 BASE) MCG/ACT IN AERS: 2.0000 | INHALATION_SPRAY | RESPIRATORY_TRACT | 1 refills | Status: AC | PRN

## 2024-03-12 MED ORDER — CROMOLYN SODIUM 100 MG/5ML PO CONC: 100.0000 mg | Freq: Three times a day (TID) | ORAL | 3 refills | Status: AC

## 2024-03-12 MED ORDER — BECLOMETHASONE DIPROP HFA 80 MCG/ACT IN AERB: 2.0000 | INHALATION_SPRAY | Freq: Two times a day (BID) | RESPIRATORY_TRACT | 3 refills | Status: AC

## 2024-03-12 NOTE — Patient Instructions (Addendum)
 Skin Continue proper skin care. Avoid the following potential triggers: alcohol, tight clothing, NSAIDs, hot showers and getting overheated.  Monitor symptoms. Continue cetirizine 10mg  twice a day. Continue famotidine  20mg  twice a day.   Environmental allergies 2023 skin testing positive to dust mites, perennial mold, mouse. Borderline to cat.  Continue environmental control measures as below. Use saline nasal spray.   Food Continue to avoid shellfish.  For mild symptoms you can take over the counter antihistamines (zyrtec 10mg  to 20mg ) and monitor symptoms closely.  If symptoms worsen or if you have severe symptoms including breathing issues, throat closure, significant swelling, whole body hives, severe diarrhea and vomiting, lightheadedness then use epinephrine  and seek immediate medical care afterwards. Emergency action plan in place.   GI symptoms Continue famotidine  20mg  twice a day.  Try cromolyn 5mL before meals and before bedtime and monitor symptoms.4 You may take it up to 4 times per day.  If you notice no improvement, then stop.   Breathing Normal breathing test today.  Daily controller medication(s): continue Qvar 80mcg 2 puffs twice a day and rinse mouth afterwards. May use albuterol  rescue inhaler 2 puffs every 4 to 6 hours as needed for shortness of breath, chest tightness, coughing, and wheezing. May use albuterol  rescue inhaler 2 puffs 5 to 15 minutes prior to strenuous physical activities. Monitor frequency of use - if you need to use it more than twice per week on a consistent basis let us  know.  Breathing control goals:  Full participation in all desired activities (may need albuterol  before activity) Albuterol  use two times or less a week on average (not counting use with activity) Cough interfering with sleep two times or less a month Oral steroids no more than once a year No hospitalizations   Follow up in 3 months or sooner if needed.    Skin care  recommendations  Bath time: Always use lukewarm water. AVOID very hot or cold water. Keep bathing time to 5-10 minutes. Do NOT use bubble bath. Use a mild soap and use just enough to wash the dirty areas. Do NOT scrub skin vigorously.  After bathing, pat dry your skin with a towel. Do NOT rub or scrub the skin.  Moisturizers and prescriptions:  ALWAYS apply moisturizers immediately after bathing (within 3 minutes). This helps to lock-in moisture. Use the moisturizer several times a day over the whole body. Good summer moisturizers include: Aveeno, CeraVe, Cetaphil. Good winter moisturizers include: Aquaphor, Vaseline, Cerave, Cetaphil, Eucerin, Vanicream. When using moisturizers along with medications, the moisturizer should be applied about one hour after applying the medication to prevent diluting effect of the medication or moisturize around where you applied the medications. When not using medications, the moisturizer can be continued twice daily as maintenance.  Laundry and clothing: Avoid laundry products with added color or perfumes. Use unscented hypo-allergenic laundry products such as Tide free, Cheer free & gentle, and All free and clear.  If the skin still seems dry or sensitive, you can try double-rinsing the clothes. Avoid tight or scratchy clothing such as wool. Do not use fabric softeners or dyer sheets.   Control of House Dust Mite Allergen Dust mite allergens are a common trigger of allergy and asthma symptoms. While they can be found throughout the house, these microscopic creatures thrive in warm, humid environments such as bedding, upholstered furniture and carpeting. Because so much time is spent in the bedroom, it is essential to reduce mite levels there.  Encase pillows, mattresses, and box  springs in special allergen-proof fabric covers or airtight, zippered plastic covers.  Bedding should be washed weekly in hot water (130 F) and dried in a hot dryer.  Allergen-proof covers are available for comforters and pillows that can't be regularly washed.  Wash the allergy-proof covers every few months. Minimize clutter in the bedroom. Keep pets out of the bedroom.  Keep humidity less than 50% by using a dehumidifier or air conditioning. You can buy a humidity measuring device called a hygrometer to monitor this.  If possible, replace carpets with hardwood, linoleum, or washable area rugs. If that's not possible, vacuum frequently with a vacuum that has a HEPA filter. Remove all upholstered furniture and non-washable window drapes from the bedroom. Remove all non-washable stuffed toys from the bedroom.  Wash stuffed toys weekly. Pet Allergen Avoidance: Contrary to popular opinion, there are no "hypoallergenic" breeds of dogs or cats. That is because people are not allergic to an animal's hair, but to an allergen found in the animal's saliva, dander (dead skin flakes) or urine. Pet allergy symptoms typically occur within minutes. For some people, symptoms can build up and become most severe 8 to 12 hours after contact with the animal. People with severe allergies can experience reactions in public places if dander has been transported on the pet owners' clothing. Keeping an animal outdoors is only a partial solution, since homes with pets in the yard still have higher concentrations of animal allergens. Before getting a pet, ask your allergist to determine if you are allergic to animals. If your pet is already considered part of your family, try to minimize contact and keep the pet out of the bedroom and other rooms where you spend a great deal of time. As with dust mites, vacuum carpets often or replace carpet with a hardwood floor, tile or linoleum. High-efficiency particulate air (HEPA) cleaners can reduce allergen levels over time. While dander and saliva are the source of cat and dog allergens, urine is the source of allergens from rabbits, hamsters, mice  and [place] pigs; so ask a non-allergic family member to clean the animal's cage. If you have a pet allergy, talk to your allergist about the potential for allergy immunotherapy (allergy shots). This strategy can often provide long-term relief. Mold Control Mold and fungi can grow on a variety of surfaces provided certain temperature and moisture conditions exist.  Outdoor molds grow on plants, decaying vegetation and soil. The major outdoor mold, Alternaria and Cladosporium, are found in very high numbers during hot and dry conditions. Generally, a late summer - fall peak is seen for common outdoor fungal spores. Rain will temporarily lower outdoor mold spore count, but counts rise rapidly when the rainy period ends. The most important indoor molds are Aspergillus and Penicillium. Dark, humid and poorly ventilated basements are ideal sites for mold growth. The next most common sites of mold growth are the bathroom and the kitchen. Outdoor (Seasonal) Mold Control Use air conditioning and keep windows closed. Avoid exposure to decaying vegetation. Avoid leaf raking. Avoid grain handling. Consider wearing a face mask if working in moldy areas.  Indoor (Perennial) Mold Control  Maintain humidity below 50%. Get rid of mold growth on hard surfaces with water, detergent and, if necessary, 5% bleach (do not mix with other cleaners). Then dry the area completely. If mold covers an area more than 10 square feet, consider hiring an indoor environmental professional. For clothing, washing with soap and water is best. If moldy items cannot be cleaned  and dried, throw them away. Remove sources e.g. contaminated carpets. Repair and seal leaking roofs or pipes. Using dehumidifiers in damp basements may be helpful, but empty the water and clean units regularly to prevent mildew from forming. All rooms, especially basements, bathrooms and kitchens, require ventilation and cleaning to deter mold and mildew growth.  Avoid carpeting on concrete or damp floors, and storing items in damp areas.

## 2024-03-14 NOTE — Therapy (Incomplete)
 OUTPATIENT OCCUPATIONAL THERAPY treatment note  Patient Name: Kathryn Park MRN: [id number] DOB:[birth year], 38 y.o., female Today's Date: 03/14/2024  PCP: Dulcie Gibes. PA REFERRING PROVIDER: [name], MD   END OF SESSION:     Past Medical History:  Diagnosis Date   Abnormal Pap smear of cervix    Allergy    Anemia    suspected poor diet; vegetarian during teens   Anxiety    Asthma    childhood   Depression    Dysmenorrhea    Ehlers-Danlos syndrome    Family history of breast cancer    Family history of kidney cancer    Granulosa cell tumor of ovary    History of iron deficiency anemia    age 54   Increased risk of breast cancer    23% lifetime risk   Joint pain    Labral tear of right hip joint    Leukocytosis    Low vitamin D  level    Migraine with aura    never on rx or saw neurology   Ovarian cancer (HCC) 2021   Ovarian cyst    Left   Pneumonia 01/2012    left lower lobe   Pre-diabetes 2015   had a borderline A1c   STD (sexually transmitted disease)    HSV1   Wears contact lenses    Wears glasses    Past Surgical History:  Procedure Laterality Date   ADENOIDECTOMY     BREAST BIOPSY Right 04/2020   benign fibroadenoma at 6:30   COLPOSCOPY     LAPAROSCOPIC OVARIAN CYSTECTOMY Left 11/13/2019   Procedure: LAPAROSCOPIC OVARIAN CYSTECTOMY/POSSIBLE LEFT OOPHORECTOMY/ COLLECTION OF PELVIC WASHINGS/ks;  Surgeon: [name], MD;  Location: [facility];  Service: Gynecology;  Laterality: Left;  Possible left oophorectomy Colection of pelvic washings To follow first case of the day   LEFT OOPHORECTOMY Left 11/2019   right hip arthroscopy  01/2022   for laberal tear repair   TONSILLECTOMY     UPPER GI ENDOSCOPY     Patient Active Problem List   Diagnosis Date Noted   Dysautonomia (HCC) 03/15/2023   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 09/29/2022   Other adverse food reactions, not elsewhere classified,  subsequent encounter 09/29/2022   Other allergic rhinitis 09/29/2022   Rash and other nonspecific skin eruption 09/29/2022   Pruritus 09/29/2022   Mild intermittent asthma without complication 09/29/2022   Gastroesophageal reflux disease 09/29/2022   EDS (Ehlers-Danlos syndrome) 06/10/2022   Tear of right acetabular labrum 07/22/2021   Breast lump 02/12/2021   Bilateral hip pain 11/25/2020   Positive ANA (antinuclear antibody) 11/25/2020   Other fatigue 11/25/2020   Bladder retention 10/13/2020   HSV-1 (herpes simplex virus 1) infection 10/13/2020   History of anorexia nervosa 10/13/2020   GAD (generalized anxiety disorder) 10/13/2020   Family history of breast cancer    Family history of kidney cancer    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    ONSET DATE: DOI: 11/21/23  REFERRING DIAG: W29.562 (ICD-10-CM) - Right hand pain   THERAPY DIAG:  No diagnosis found.  Rationale for Evaluation and Treatment: Rehabilitation  PERTINENT HISTORY: Per referral: Evaluate and Treat for R hand pain after 5th MC fx and shoulder pain 1-2 times per week for 4-6 weeks.   Pt hit a door out of anger this morning, striking the door w/ the pinky side of her R hand. Pt locates pain to 5th finger, 5th  MC. She notes a little bit of pain in the 3rd-4th fingers/MC    Per imaging:  mildly comminuted transverse fracture through the proximal aspect of the fifth metacarpal. There is mild apex posterior angulation. Distal fracture fragment is displaced posteriorly 3 mm. There is overlying soft tissue swelling. There is no dislocation.   Past Medical Hx includes cervical CA and she wears a K-N95 today.  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  She wore a cast for 3 weeks, and transitioned to a buddy strap that she couldn't tolerate and d/c'd quickly.   Now she has some  some crossing over, Also states hx of OA in her fingers/thumb and hx of EDS. She also has multiple falls in a day that present as tightness in her back that causes her body to draw tightly and lose balance.    PRECAUTIONS: Fall and Other: latex allergies.  For fall risk, OT will use close approximation and she is following up with [facility] for an ataxia clinic which will work on her balance as well.   RED FLAGS: None   WEIGHT BEARING RESTRICTIONS: No    SUBJECTIVE:   SUBJECTIVE STATEMENT: She states ***    the arthritis symptoms that she complained about in her thumbs mainly seem to be well-controlled by exercises now, but in general her pain is exacerbated by the rainy weather.  She does state that her right shoulder continues to be sharp and painful limiting factor to the use of her right arm.      PAIN:  Are you having pain? Yes: NPRS scale: ***/10 Pain location: Right hand right hand small finger and metacarpal area also thumb CMC joints Pain description: Aching and sore Aggravating factors: Tight gripping Relieving factors: Rest  FALLS: Has patient fallen in last 6 months? Yes. Number of falls she reports multiple falls a day at times.  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  She will be following up with [facility] for this issue and has had multiple MRIs, though there is no consensus on why she has these balance and cramping issues now  LIVING ENVIRONMENT: Lives with: lives with their family Lives in: House/apartment Has following equipment at home: Single point cane and possibly others  PLOF: Independent with basic ADLs, Independent with household mobility without device, Independent with community mobility with device, and Needs assistance with homemaking  PATIENT GOALS: To improve pain in the right hand due to the small finger fracture and crossing over, and also learn strategies to deal with  hand arthritis and pain.  She would also like to work on right shoulder pain  NEXT MD VISIT: As needed with Dr. [name]  OBJECTIVE: (All objective assessments below are from initial evaluation on: 02/21/2024 unless otherwise specified.)    HAND DOMINANCE: Right   ADLs: Overall ADLs: States decreased ability to grab, hold household objects, pain and difficulty to open containers, perform FMS tasks (manipulate fasteners on clothing)   FUNCTIONAL OUTCOME MEASURES: Eval: Patient Specific Functional Scale: 3 (Writing, computer work, food prep)  (Higher Score  =  Better Ability for the Selected Tasks)       UPPER EXTREMITY ROM     Shoulder to Wrist AROM Right eval  Shoulder flexion Approximately 165 degrees  Shoulder abduction   Shoulder extension  Shoulder internal rotation Approximately 60 degrees  Shoulder external rotation Approximately 85 degrees  Elbow flexion Within normal limits  Elbow extension Within normal limits  Forearm supination   Forearm pronation    Wrist flexion 85  Wrist extension 75  Wrist ulnar deviation   Wrist radial deviation   Functional dart thrower's motion (F-DTM) in ulnar flexion   F-DTM in radial extension    (Blank rows = not tested)   Hand AROM Right eval Rt 03/08/24  Full Fist Ability (or Gap to Distal Palmar Crease) full   Thumb Opposition  (Kapandji Scale)  10/10   Thumb MCP (0-60)    Thumb IP (0-80)    Thumb Radial Abduction Span    Thumb Palmar Abduction Span    Index MCP (0-90)    Index PIP (0-100)    Index DIP (0-70)     Long MCP (0-90)     Long PIP (0-100)     Long DIP (0-70)     Ring MCP (0-90)     Ring PIP (0-100)     Ring DIP (0-70)     Little MCP (0-90)  0 - 89 0 - 77  Little PIP (0-100)  0 - 96 0 - 95  Little DIP (0-70)  (+3) - 75 0 - 82  (Blank rows = not tested)   HAND FUNCTION: 03/08/24: Grip: Rt: 46#   Eval: Observed weakness in affected Rt hand.  Grip strength Right: 44 lbs, Left: 64 lbs    COORDINATION: Eval: No issues noted today for fine motor skills, she does have some instability with gross motor skills, she complains about pain with writing and other coordinated activities  OBSERVATIONS:   Eval: She has a swollen lump over the base of the dorsum of the fifth metacarpal of the right hand, she is largely nontender now, she has some radial rotation at the MCP joint causing some scissoring or crossing over but this is passively flexible today.  When she makes a fist she does not always scissoring or crossover.  She does have some intrinsic tightness in the right hand  She is able to make her right shoulder grind and pop in strange ways if she sits on her hand and internal rotation and flexes her scapula.  She was told to not do this and not try to cause problems or pain or tearing around her arm or shoulder due to her hyperflexibility.  Overall, she seems to have some scapular weakness and instability which is typical with EDS but also some tightness with internal rotation.  Lastly she also appears to have some CMC joint thumb arthritis bilaterally worse in the right hand.  We can work on this as well with recommendations and exercises.   TODAY'S TREATMENT:  03/19/24: *** We will likely not focus on shoulder chronic symptoms as this agitated her somewhat.  Will focus on finalizing a plan for maintenance of Ehlers-Danlos/arthritis symptoms as well as recovering from small finger fracture.  If she would like to work on shoulder girdle issues we can, but OT will also consider recommending her to a mental health specialist as she seems to have some overt anxiety and has an obviously low frustration tolerance.     03/08/2024: We review her home exercise program for the small finger fracture recovery as well as for management of hand and thumb arthritis that is exacerbated by Ehlers-Danlos syndrome.  She tolerates this review very well, having no significant pain with passive stretches at  the small finger, stating understanding thumb stretches and isometric towel roll gripping, etc.  OT upgrades her home exercises to include therapy putty activities for pinching in a stable position, composite fisting, and FDP strengthening for intrinsic tightness.  She tolerates these well without significant pain.    Lastly, OT tries to help her address her right neck/shoulder/clavicular pain by giving the bolded exercises below.  She does seem to have tightness into internal rotation (tightness in the external rotators), tightness of the chest with forward shoulder posture, and general weakness/instability of the shoulder girdle due to Ehlers-Danlos syndrome.    She tolerates isometric strengthening for scapular retraction and shoulder external rotation very well, but when trying to stretch the chest/clavicular area gently, she has difficulty proceeding or finding a good stretch.  We tried several ways and standing, and in supine.  She seems to become frustrated and states sharp pains through the upper trap and scalene muscles of her neck.  OT suggest doing gentle lateral neck flexion to stretch these areas and she states I do that.  She seems visibly frustrated and appears to stop following OT directions at this point.  OT decides to finish up the session there, encourages her to try to find nonpainful stretches and do gentle strengthening without pain, as all humans can do this if they are being cautious and thoughtful.  It is encouraging that her hand issues including fracture and arthritis are feeling better.  She did lose her balance several times today but OT was there and guarding her to prevent any injury or accident.   Exercises/activities (new or bolded) - Bend and Pull Back Wrist SLOWLY  - 3-4 x daily - 10-15 reps - Tendon Glides  - 3-4 x daily - 10-15 reps - Finger Spreading  - 3-4 x daily - 10-15 reps - 5 sec hold - Seated Finger Composite Flexion Stretch  - 4 x daily - 3-5 reps - 15  hold - Stretch thumb toward base of small finger (put hand in LAP)  - 2-3 x daily - 3-5 reps - 15 sec hold - Thumb Webspace Stretch  - 3 x daily - 3 reps - 15 sec hold - Towel Roll Grip with Forearm in Neutral  - 3 x daily - 5 reps - 10 sec hold - Putty activities in Full Fist  - 2-3 x daily - 5 reps - 10 sec hold - Thumb Opposition with Putty  - 2-3 x daily - 5 reps - Seated Claw Fist with Putty  - 2-3 x daily - 5 reps - Doorway Stretches (both arms low, lean in gently)   - 3-4 x daily - 3-5 reps - 15 hold - Sleeper Stretch  - 3-4 x daily - 5 reps - 15-20 sec hold - Standing Shoulder Row with Anchored Resistance  - 2-4 x daily - 1-2 sets - 10-15 reps - Shoulder External Rotation with Anchored Resistance  - 4-6 x daily - 1 sets - 10-15 reps   PATIENT EDUCATION: Education details: See tx section above for details  Person educated: Patient Education method: Verbal Instruction, Teach back, Handouts  Education comprehension: States and demonstrates understanding, Additional Education required    HOME EXERCISE PROGRAM: Access Code: [email or web address] URL: [email or web address] Date: 02/21/2024 Prepared by: Leartis Proud   GOALS: Goals reviewed with patient? Yes   SHORT TERM GOALS: (STG required if POC>30 days) Target Date: 03/09/2024  Pt will obtain protective, custom orthotic. Goal status: TBD/PRN  2.  Pt will demo/state understanding  of initial HEP to improve pain levels and prerequisite motion. Goal status: 03/08/2024: Goal met   LONG TERM GOALS: Target Date: 04/06/2024  Pt will improve functional ability by decreased impairment per PSFS assessment from 3 to 6 or better, for better quality of life. Goal status: INITIAL  2.  Pt will improve grip strength in right hand from 44 lbs to at least 50 to lbs for functional use at home and in IADLs. Goal status: INITIAL  3.  Pt will improve A/ROM in right shoulder internal rotation from approx 60 degrees to approx 70 degrees,  to have functional motion for tasks like reach and grasp.  Goal status: INITIAL  4.  Pt will improve strength in right scapula to improve dynamic scapular motion, reduce pain and symptoms.   Goal status: INITIAL  5.  Pt will improve coordination skills in right hand by her stated ability for better writing ability with less pain and problems.  Goal status: INITIAL  6.  Pt will decrease pain at worst from 7-8/10 to 3/10 or better to have better sleep and occupational participation in daily roles. Goal status: INITIAL   ASSESSMENT:  CLINICAL IMPRESSION: 03/19/24: ***  03/08/2024: Fortunately she is doing very well with her small finger, thumbs, hand symptoms and complaints.  Trying to address her chronic shoulder problem was quite difficult, and she became visibly, mildly agitated stating I do not understand what I am supposed to feel.  She had difficulty describing her pain or whether she was feeling stretches or pain.  This seems to be something like a centralized or chronic pain syndrome that makes it difficult for her to understand what she is feeling and perceiving.    PLAN:  OT FREQUENCY: 1-2x/week  OT DURATION: 6 weeks through 04/06/2024 and up to 8 total visits as needed  PLANNED INTERVENTIONS: 97535 self care/ADL training, 16109 therapeutic exercise, 97530 therapeutic activity, 97112 neuromuscular re-education, 97140 manual therapy, 97760 Orthotic Initial, 97763 Orthotic/Prosthetic subsequent, Dry needling, coping strategies training, patient/family education, and DME and/or AE instructions  RECOMMENDED OTHER SERVICES: None now that she will be following up with other medical providers for ataxia, poor gait, other issues  CONSULTED AND AGREED WITH PLAN OF CARE: Patient  PLAN FOR NEXT SESSION:   ***   Leartis Proud, OTR/L, CHT 03/14/2024, 8:31 AM

## 2024-03-15 ENCOUNTER — Encounter: Admitting: Rehabilitative and Restorative Service Providers"

## 2024-03-19 ENCOUNTER — Encounter: Admitting: Rehabilitative and Restorative Service Providers"

## 2024-03-20 ENCOUNTER — Encounter: Admitting: Physical Therapy

## 2024-03-26 ENCOUNTER — Encounter: Admitting: Rehabilitative and Restorative Service Providers"

## 2024-03-27 ENCOUNTER — Encounter: Admitting: Physical Therapy

## 2024-04-02 ENCOUNTER — Other Ambulatory Visit: Payer: Self-pay | Admitting: Obstetrics and Gynecology

## 2024-04-02 DIAGNOSIS — Z1231 Encounter for screening mammogram for malignant neoplasm of breast: Secondary | ICD-10-CM

## 2024-04-30 ENCOUNTER — Telehealth: Payer: Self-pay | Admitting: Internal Medicine

## 2024-04-30 NOTE — Telephone Encounter (Signed)
 Patient called and stated that she wants to make an appointment with Dr. Federico and doe not understand why it's so difficult for us  to schedule her. Patient is requesting a call back. Please advise.

## 2024-05-01 NOTE — Telephone Encounter (Signed)
 Patient returning call.

## 2024-05-01 NOTE — Telephone Encounter (Signed)
 Left message for pt to call back

## 2024-05-01 NOTE — Telephone Encounter (Signed)
 Pt stated that she is requesting an office visit with Dr. Federico due to on going  nausea. Pt was scheduled to see Dr. Federico on 05/03/2024 at 9:10. Pt made aware. Pt verbalized understanding with all questions answered.

## 2024-05-03 ENCOUNTER — Ambulatory Visit (INDEPENDENT_AMBULATORY_CARE_PROVIDER_SITE_OTHER): Admitting: Internal Medicine

## 2024-05-03 ENCOUNTER — Encounter: Payer: Self-pay | Admitting: Internal Medicine

## 2024-05-03 VITALS — BP 96/70 | HR 88 | Ht 66.0 in | Wt 122.0 lb

## 2024-05-03 DIAGNOSIS — R112 Nausea with vomiting, unspecified: Secondary | ICD-10-CM

## 2024-05-03 DIAGNOSIS — R194 Change in bowel habit: Secondary | ICD-10-CM | POA: Diagnosis not present

## 2024-05-03 DIAGNOSIS — K219 Gastro-esophageal reflux disease without esophagitis: Secondary | ICD-10-CM

## 2024-05-03 DIAGNOSIS — R110 Nausea: Secondary | ICD-10-CM | POA: Diagnosis not present

## 2024-05-03 DIAGNOSIS — R198 Other specified symptoms and signs involving the digestive system and abdomen: Secondary | ICD-10-CM

## 2024-05-03 MED ORDER — ONDANSETRON HCL 4 MG PO TABS: 4.0000 mg | ORAL_TABLET | Freq: Three times a day (TID) | ORAL | 1 refills | Status: AC | PRN

## 2024-05-03 NOTE — Patient Instructions (Signed)
 You have been scheduled for a gastric emptying scan at [facility] on 05/15/24 at 7 am. Please arrive at least 30 minutes prior to your appointment for registration. Please make certain not to have anything to eat or drink after midnight the night before your test. Hold all stomach medications (ex: Zofran , phenergan , Reglan) 24 hours prior to your test. If you need to reschedule your appointment, please contact radiology scheduling at [phone]. _____________________________________________________________________ A gastric-emptying study measures how long it takes for food to move through your stomach. There are several ways to measure stomach emptying. In the most common test, you eat food that contains a small amount of radioactive material. A scanner that detects the movement of the radioactive material is placed over your abdomen to monitor the rate at which food leaves your stomach. This test normally takes about 4 hours to complete. _____________________________________________________________________  Rosine have been given a testing kit to check for small intestine bacterial overgrowth (SIBO) which is completed by a company named [employer]. Make sure to return your test in the mail using the return mailing label given to you along with the kit. The test order, your demographic and insurance information have all already been sent to the company. [facility] will collect an upfront charge of $109.00 for commercial insurance plans and $229.00 if you are paying cash. The potential remaining total after claim submission and review is $120.00. Make sure to discuss with [facility] PRIOR to having the test to see if they have gotten information from your insurance company as to how much your testing will cost out of pocket, if any. Please contact [facility] at phone number [phone] to get instructions regarding how to perform the test as our office is unable to give specific  testing instructions.   We have sent the following medications to your pharmacy for you to pick up at your convenience: Zofran   Start taking daily Sunfiber _______________________________________________________  If your blood pressure at your visit was 140/90 or greater, please contact your primary care physician to follow up on this.  _______________________________________________________  If you are age 17 or older, your body mass index should be between 23-30. Your Body mass index is 19.69 kg/m. If this is out of the aforementioned range listed, please consider follow up with your Primary Care Provider.  If you are age 74 or younger, your body mass index should be between 19-25. Your Body mass index is 19.69 kg/m. If this is out of the aformentioned range listed, please consider follow up with your Primary Care Provider.   ________________________________________________________  The [facility] providers would like to encourage you to use MYCHART to communicate with providers for non-urgent requests or questions.  Due to long hold times on the telephone, sending your provider a message by [facility] may be a faster and more efficient way to get a response.  Please allow 48 business hours for a response.  Please remember that this is for non-urgent requests.  _______________________________________________________  [facility] is using a team-based approach to care.  Your team is made up of your doctor and two to three APPS. Our APPS (Nurse Practitioners and Physician Assistants) work with your physician to ensure care continuity for you. They are fully qualified to address your health concerns and develop a treatment plan. They communicate directly with your gastroenterologist to care for you. Seeing the Advanced Practice Practitioners on your physician's team can help you by facilitating care more promptly, often allowing for earlier appointments, access to diagnostic testing,  procedures, and other specialty referrals.   Due to recent changes in healthcare laws, you may see the results of your imaging and laboratory studies on MyChart before your provider has had a chance to review them.  We understand that in some cases there may be results that are confusing or concerning to you. Not all laboratory results come back in the same time frame and the provider may be waiting for multiple results in order to interpret others.  Please give us  48 hours in order for your provider to thoroughly review all the results before contacting the office for clarification of your results.   Thank you for entrusting me with your care and for choosing [facility], Dr. Estefana Kidney

## 2024-05-03 NOTE — Progress Notes (Signed)
 Chief Complaint: Nausea  HPI : 38 year old female with history of Ehlers-Danlos, migraine, anemia, and ovarian cancer s/p left oophorectomy presents for nausea  Interval History: Nausea is still there, mostly in the mornings. She has been working with allergist Dr. Luke for evaluation of MCAS. She has been trying to take Cromolyn  for MCAS, though this has been difficult to keep up with at times. Zyrtec and Pepcid  seemed to help with her symptoms. She was found to be negative for POTS. She is trying to manage her dysautonomia with just dietary changes. She saw Dr. Viktoria and was told that she may need a PET/CT scan in the future to rule out a paraneoplastic syndrome. Zofran  has helped with nausea. She saw endocrinology and she did a cosyntropin  stim test and that was normal so she does not have adrenal insufficiency. She did not find any benefit from omeprazole  in terms of her nausea so she is off of omeprazole  currently. Endorses some bloating issues. BMs are inconsistent. She will have alternating constipation and diarrhea. She is drinking plenty of water.  Wt Readings from Last 3 Encounters:  05/03/24 122 lb (55.3 kg)  03/12/24 128 lb 4.8 oz (58.2 kg)  03/01/24 131 lb (59.4 kg)   Past Medical History:  Diagnosis Date   Abnormal Pap smear of cervix    Allergy    Anemia    suspected poor diet; vegetarian during teens   Anxiety    Asthma    childhood   Depression    Dysmenorrhea    Ehlers-Danlos syndrome    Family history of breast cancer    Family history of kidney cancer    Granulosa cell tumor of ovary    History of iron deficiency anemia    age 72   Increased risk of breast cancer    23% lifetime risk   Joint pain    Labral tear of right hip joint    Leukocytosis    Low vitamin D  level    Migraine with aura    never on rx or saw neurology   Ovarian cancer (HCC) 2021   Ovarian cyst    Left   Pneumonia 01/2012    left lower lobe   Pre-diabetes 2015   had a borderline  A1c   STD (sexually transmitted disease)    HSV1   Wears contact lenses    Wears glasses      Past Surgical History:  Procedure Laterality Date   ADENOIDECTOMY     BREAST BIOPSY Right 04/2020   benign fibroadenoma at 6:30   COLPOSCOPY     LAPAROSCOPIC OVARIAN CYSTECTOMY Left 11/13/2019   Procedure: LAPAROSCOPIC OVARIAN CYSTECTOMY/POSSIBLE LEFT OOPHORECTOMY/ COLLECTION OF PELVIC WASHINGS/ks;  Surgeon: Cathlyn JAYSON Nikki Bobie FORBES, MD;  Location: [facility];  Service: Gynecology;  Laterality: Left;  Possible left oophorectomy Colection of pelvic washings To follow first case of the day   LEFT OOPHORECTOMY Left 11/2019   right hip arthroscopy  01/2022   for laberal tear repair   TONSILLECTOMY     UPPER GI ENDOSCOPY     Family History  Problem Relation Age of Onset   Asthma Mother    Allergic rhinitis Mother    Diabetes Mother    Hypertension Mother    Stroke Mother        TIA   Factor V Leiden deficiency Mother    Other Mother        pituitary tumor   Colon polyps Mother  Diverticulitis Mother    Allergic rhinitis Father    Eczema Father    Kidney cancer Father        cancerous tumor of kidney   Ulcerative colitis Father    Allergic rhinitis Brother    Eczema Brother    Other Brother        ear tumor, dx. age 80-6   Breast cancer Maternal Grandmother        dx. 80s   Stroke Maternal Grandfather    Rheum arthritis Maternal Great-grandmother    Cancer Other    Ovarian cancer Neg Hx    Endometrial cancer Neg Hx    Colon cancer Neg Hx    Social History   Tobacco Use   Smoking status: Former    Current packs/day: 1.00    Types: Cigarettes    Passive exposure: Past   Smokeless tobacco: Never  Vaping Use   Vaping status: Never Used  Substance Use Topics   Alcohol use: Not Currently   Drug use: No   Current Outpatient Medications  Medication Sig Dispense Refill   albuterol  (VENTOLIN  HFA) 108 (90 Base) MCG/ACT inhaler Inhale 2 puffs into the  lungs every 4 (four) hours as needed for wheezing or shortness of breath. 18 g 1   beclomethasone (QVAR) 80 MCG/ACT inhaler Inhale 2 puffs into the lungs 2 (two) times daily. Rinse mouth after each use. 3 each 3   cromolyn  (GASTROCROM ) 100 MG/5ML solution Take 5 mLs (100 mg total) by mouth 4 (four) times daily -  before meals and at bedtime. 480 mL 3   cyanocobalamin  1000 MCG tablet Take 1,000 mcg by mouth daily.     EPINEPHrine  0.3 mg/0.3 mL IJ SOAJ injection Inject 0.3 mg into the muscle as needed for anaphylaxis. 2 each 1   escitalopram  (LEXAPRO ) 20 MG tablet Take 1/2 tablet (10mg ) tablet by mouth daily for 2 weeks out of cycle 45 tablet 3   famotidine  (PEPCID ) 20 MG tablet Take 1 tablet (20 mg total) by mouth 2 (two) times daily. 60 tablet 3   fexofenadine (ALLEGRA) 180 MG tablet Take 180 mg by mouth in the morning and at bedtime. (Patient taking differently: Take 180 mg by mouth as needed. Alternating with Zyrtec)     Flaxseed, Linseed, (FLAXSEED OIL PO) 1 capsule by Other route daily.     magnesium oxide (MAG-OX) 400 (240 Mg) MG tablet Take 400 mg by mouth daily.     Melatonin 10 MG TABS Take 1 tablet by mouth at bedtime.     ondansetron  (ZOFRAN ) 4 MG tablet Take 1 tablet (4 mg total) by mouth 3 (three) times daily as needed for nausea or vomiting. 30 tablet 1   QUERCETIN PO Take 1 capsule by mouth daily in the afternoon.     Turmeric 500 MG CAPS Take 1 capsule by mouth daily in the afternoon.     valACYclovir  (VALTREX ) 1000 MG tablet Take 2 tablets (2000 mg) by mouth twice a day for 24 hours as needed for cold sore. 30 tablet 1   cetirizine (ZYRTEC) 5 MG tablet Take 5 mg by mouth daily.     No current facility-administered medications for this visit.   Allergies  Allergen Reactions   Shellfish Allergy Anaphylaxis    Throat closes   Other Other (See Comments)    Watermelon, mold/mildew, pet dander, dust and dust mites  Swelling of eyes and congestion   Latex Rash   Sulfa Antibiotics  Rash   Physical Exam:  BP 96/70 (BP Location: Left Arm, Patient Position: Sitting, Cuff Size: Normal)   Pulse 88   Ht 5' 6 (1.676 m) Comment: height measured without shoes  Wt 122 lb (55.3 kg)   LMP 04/19/2024   BMI 19.69 kg/m  Constitutional: Pleasant,well-developed, female in no acute distress. HEENT: Normocephalic and atraumatic. Conjunctivae are normal. No scleral icterus. Cardiovascular: Normal rate, regular rhythm.  Pulmonary/chest: Effort normal and breath sounds normal. No wheezing, rales or rhonchi. Abdominal: Soft, nondistended, mildly tender diffusely. Bowel sounds active throughout.  Extremities: Bilateral lower extremities in compression stocks Neurological: Alert and oriented to person place and time. Skin: Skin is warm and dry. No rashes noted. Psychiatric: Normal mood and affect. Behavior is normal.  Labs 12/2021: CBC with low Hb of 11.8. CMP unremarkable.  Labs 02/2022: CBC normal.  Ferritin/IBC normal.  Labs 09/2022: CBC unremarkable.  Alpha gal panel was negative.  C3/C4 were normal.  Tryptase was normal.  CRP and ESR were normal.  TSH normal.  Vitamin B12 normal.  ANA negative.  Labs 01/2024: CBC nml. CMP nml. Hep B surface antibody positive.  CT A/P w/contrast 02/17/22: IMPRESSION: 1. Along the left posterior uterine margin, a 2.4 cc mixed density structure with high density component is observed. Questionably present a lower in density on 11/29/2019. Possibilities include scarring, ovarian remnant, small serosal fibroid, or less likely recurrent tumor. If clinically warranted, complimentary assessment with pelvic MRI with and without contrast might be considered. 2. Although the aortomesenteric distance is reduced at 5 mm, the aortomesenteric angle is within normal limits 35 degrees and accordingly superior mesenteric artery syndrome is considered less likely.  RUQ U/S 06/17/23: IMPRESSION: No acute abnormality identified.  EGD 02/26/22: - Normal esophagus.  Biopsied.  - Gastritis. Biopsied.  - Duodenal erosions without bleeding. Biopsied. Path: 1. Surgical [P], duodenal biopsies REACTIVE DUODENAL MUCOSA WITH GASTRIC METAPLASIA COMPATIBLE WITH PEPTIC DUODENITIS 2. Surgical [P], gastric biopsies REACTIVE GASTROPATHY NEGATIVE FOR H. PYLORI, INTESTINAL METAPLASIA, DYSPLASIA AND CARCINOMA 3. Surgical [P], esophageal biopsies MILDLY REACTIVE SQUAMOUS MUCOSA NEGATIVE FOR GLANDULAR EPITHELIUM, EOSINOPHILS, DYSPLASIA AND CARCINOMA  Colonoscopy 02/26/22: - The examined portion of the ileum was normal.  - Three 3 to 12 mm polyps in the transverse colon and in the ascending colon, removed with a cold snare. Resected and retrieved.  - Non- bleeding internal hemorrhoids. Path: 4. Surgical [P], colon, ascending and transverse, polyp (3) SESSILE SERRATED POLYP WITHOUT CYTOLOGIC DYSPLASIA, 2 FRAGMENTS BENIGN LYMPHOID AGGREGATES  ASSESSMENT AND PLAN: Nausea and vomiting GERD Alternating constipation and diarrhea with constipation being slightly more prominent Patient presents with some persistent issues with nausea. She did not have any benefit from GERD therapy for her nausea. Will have her start daily Sunfiber to see if regulating her bowel habits will help with her nausea. Will also evaluate for gastroparesis and/or SIBO by performing a GES and SIBO breath test. In the meantime Zofran  seems effective for keeping her nausea under control.  - Start daily Sunfiber - Continue Zofran  4 mg TID PRN. Refill - Check gastric emptying study - Check SIBO breath test - Next colonoscopy for polyp surveillance due in 02/2025 - RTC in 3 months  Estefana Kidney, MD  I spent 35 minutes of time, including in depth chart review, independent review of results as outlined above, communicating results with the patient directly, face-to-face time with the patient, coordinating care, ordering studies and medications as appropriate, and documentation.

## 2024-05-14 ENCOUNTER — Encounter: Payer: Self-pay | Admitting: Gynecologic Oncology

## 2024-05-15 ENCOUNTER — Ambulatory Visit (HOSPITAL_COMMUNITY)
Admission: RE | Admit: 2024-05-15 | Discharge: 2024-05-15 | Disposition: A | Discharge status: Home / Self Care | Source: Ambulatory Visit | Attending: Internal Medicine | Admitting: Internal Medicine

## 2024-05-15 DIAGNOSIS — R198 Other specified symptoms and signs involving the digestive system and abdomen: Secondary | ICD-10-CM | POA: Diagnosis present

## 2024-05-15 DIAGNOSIS — R112 Nausea with vomiting, unspecified: Secondary | ICD-10-CM | POA: Insufficient documentation

## 2024-05-15 DIAGNOSIS — K219 Gastro-esophageal reflux disease without esophagitis: Secondary | ICD-10-CM | POA: Diagnosis present

## 2024-05-15 MED ORDER — TECHNETIUM TC 99M SULFUR COLLOID
2.0000 | Freq: Once | INTRAVENOUS | Status: AC
  Administered 2024-05-15 (×2): 2.06 via ORAL

## 2024-05-16 ENCOUNTER — Other Ambulatory Visit: Payer: Self-pay | Admitting: Gynecologic Oncology

## 2024-05-16 DIAGNOSIS — G901 Familial dysautonomia [Riley-Day]: Secondary | ICD-10-CM

## 2024-05-16 DIAGNOSIS — D3912 Neoplasm of uncertain behavior of left ovary: Secondary | ICD-10-CM

## 2024-05-16 DIAGNOSIS — R634 Abnormal weight loss: Secondary | ICD-10-CM

## 2024-05-21 ENCOUNTER — Other Ambulatory Visit: Payer: Self-pay | Admitting: Gynecologic Oncology

## 2024-05-21 ENCOUNTER — Ambulatory Visit: Payer: Self-pay | Admitting: Internal Medicine

## 2024-05-21 DIAGNOSIS — R978 Other abnormal tumor markers: Secondary | ICD-10-CM

## 2024-05-21 DIAGNOSIS — G901 Familial dysautonomia [Riley-Day]: Secondary | ICD-10-CM

## 2024-05-21 DIAGNOSIS — D3912 Neoplasm of uncertain behavior of left ovary: Secondary | ICD-10-CM

## 2024-05-21 DIAGNOSIS — R634 Abnormal weight loss: Secondary | ICD-10-CM

## 2024-05-22 ENCOUNTER — Telehealth: Payer: Self-pay | Admitting: *Deleted

## 2024-05-22 NOTE — Telephone Encounter (Signed)
 Attempted to reach patient in regards to scheduled Ct scan. Left voicemail requesting call back.

## 2024-05-22 NOTE — Telephone Encounter (Signed)
 LMOM for the patient to cll the office back tomorrow morning. Patient needs a lab appt before her CT scan tomorrow.

## 2024-05-22 NOTE — Telephone Encounter (Signed)
 Spoke with Ms. Hollinsworth and relayed message in regards to her Pet Scan not being approved with her insurance at this time, A Ct scan of chest, abd and pelvis has been ordered and scheduled (with approval) for Wednesday, August 20 th with arrival time of 2 pm at [facility]. Pt states she is aware of her CT scan appt. As a technician just called her with appt. And instructions. Pt thanked the office for calling.

## 2024-05-23 ENCOUNTER — Encounter (HOSPITAL_COMMUNITY)
Admission: RE | Admit: 2024-05-23 | Discharge: 2024-05-23 | Disposition: A | Discharge status: Home / Self Care | Source: Ambulatory Visit | Attending: Gynecologic Oncology | Admitting: Gynecologic Oncology

## 2024-05-23 ENCOUNTER — Inpatient Hospital Stay: Attending: Gynecologic Oncology

## 2024-05-23 DIAGNOSIS — R978 Other abnormal tumor markers: Secondary | ICD-10-CM

## 2024-05-23 DIAGNOSIS — R634 Abnormal weight loss: Secondary | ICD-10-CM | POA: Insufficient documentation

## 2024-05-23 DIAGNOSIS — G901 Familial dysautonomia [Riley-Day]: Secondary | ICD-10-CM

## 2024-05-23 DIAGNOSIS — Z3202 Encounter for pregnancy test, result negative: Secondary | ICD-10-CM | POA: Diagnosis present

## 2024-05-23 DIAGNOSIS — D3912 Neoplasm of uncertain behavior of left ovary: Secondary | ICD-10-CM

## 2024-05-23 LAB — PREGNANCY, URINE: Preg Test, Ur: NEGATIVE

## 2024-05-23 MED ORDER — IOHEXOL 300 MG/ML  SOLN
100.0000 mL | Freq: Once | INTRAMUSCULAR | Status: AC | PRN
  Administered 2024-05-23: 100 mL via INTRAVENOUS

## 2024-05-23 MED ORDER — IOHEXOL 9 MG/ML PO SOLN
1000.0000 mL | ORAL | Status: AC
  Administered 2024-05-23: 1000 mL via ORAL

## 2024-05-23 NOTE — Telephone Encounter (Signed)
 Spoke with Kathryn Park and patient is aware of her lab appointment scheduled for 1:45 today prior to her CT scan.

## 2024-05-24 ENCOUNTER — Encounter (HOSPITAL_COMMUNITY)

## 2024-05-28 ENCOUNTER — Ambulatory Visit: Payer: Self-pay | Admitting: Gynecologic Oncology

## 2024-05-28 ENCOUNTER — Other Ambulatory Visit: Payer: Self-pay

## 2024-05-28 ENCOUNTER — Encounter: Payer: Self-pay | Admitting: Internal Medicine

## 2024-05-28 MED ORDER — RIFAXIMIN 550 MG PO TABS: 550.0000 mg | ORAL_TABLET | Freq: Three times a day (TID) | ORAL | 0 refills | Status: DC

## 2024-05-28 NOTE — Progress Notes (Signed)
 MyChart message sent to patient with results. Rifaximin  prescription sent to pharmacy on file.

## 2024-05-28 NOTE — Progress Notes (Signed)
 Received results of SIBO breath test (collected 05/14/24), which showed results consistent with SIBO (elevation in hydrogen but not in methane). Will have this report scanned into the EMR.  Pod B triage, please let the patient know that her SIBO breath test came back as positive. I would recommend that she start treatment with rifaximin  550 mg TID for 14 days as treatment (please put IBS-D as the diagnosis for the treatment).

## 2024-05-30 ENCOUNTER — Telehealth: Payer: Self-pay

## 2024-05-30 NOTE — Telephone Encounter (Signed)
 Pharmacy Patient Advocate Encounter   Received notification from Patient Advice Request messages that prior authorization for Xifaxan  550MG  tablets is required/requested.   Insurance verification completed.   The patient is insured through [facility] MEDICAID .   Per test claim: PA required; PA submitted to above mentioned insurance via Latent Key/confirmation #/EOC [id number] Status is pending

## 2024-05-30 NOTE — Telephone Encounter (Signed)
 PA request has been Submitted. New Encounter has been or will be created for follow up. For additional info see Pharmacy Prior Auth telephone encounter from 05-30-2024.

## 2024-05-31 NOTE — Telephone Encounter (Signed)
 Pharmacy Patient Advocate Encounter  Received notification from [facility] MEDICAID that Prior Authorization for Xifaxan  550MG  tablets has been DENIED.  Full denial letter will be uploaded to the media tab. See denial reason below.  We denied the medical services/items listed above because:  Xifaxan  (rifaximin ) criteria was not met. To meet our criteria:  You must try one of the following drugs a formulary tricyclic antidepressant: Amitriptyline or Nortriptyline  PA #/Case ID/Reference #: [id number]

## 2024-06-01 ENCOUNTER — Telehealth: Payer: Self-pay | Admitting: *Deleted

## 2024-06-01 ENCOUNTER — Other Ambulatory Visit: Payer: Self-pay | Admitting: Gynecologic Oncology

## 2024-06-01 DIAGNOSIS — R634 Abnormal weight loss: Secondary | ICD-10-CM

## 2024-06-01 DIAGNOSIS — R978 Other abnormal tumor markers: Secondary | ICD-10-CM

## 2024-06-01 DIAGNOSIS — D3912 Neoplasm of uncertain behavior of left ovary: Secondary | ICD-10-CM

## 2024-06-01 DIAGNOSIS — G901 Familial dysautonomia [Riley-Day]: Secondary | ICD-10-CM

## 2024-06-01 NOTE — Telephone Encounter (Addendum)
 Per Dr Viktoria patient scheduled for a PET scan on 9/3 at 4:30 pm. [facility] for the patient to call the office back. Patient needs to be given the appt date/time and instructions for the PET scan.   Instructions---arrive at 4 pm, NPO except water 6 hrs prior to the scan 10:30 am

## 2024-06-01 NOTE — Telephone Encounter (Signed)
 Patient called back and was given the appt date/time/instructions

## 2024-06-05 ENCOUNTER — Encounter: Payer: Self-pay | Admitting: Sports Medicine

## 2024-06-05 MED ORDER — DOXYCYCLINE HYCLATE 100 MG PO CAPS: 100.0000 mg | ORAL_CAPSULE | Freq: Two times a day (BID) | ORAL | 0 refills | Status: AC

## 2024-06-05 NOTE — Addendum Note (Signed)
 Addended by: Isai Gottlieb N on: 06/05/2024 01:37 PM   Modules accepted: Orders

## 2024-06-06 ENCOUNTER — Encounter (HOSPITAL_COMMUNITY)
Admission: RE | Admit: 2024-06-06 | Discharge: 2024-06-06 | Disposition: A | Discharge status: Home / Self Care | Source: Ambulatory Visit | Attending: Gynecologic Oncology | Admitting: Gynecologic Oncology

## 2024-06-06 DIAGNOSIS — G901 Familial dysautonomia [Riley-Day]: Secondary | ICD-10-CM | POA: Insufficient documentation

## 2024-06-06 DIAGNOSIS — R634 Abnormal weight loss: Secondary | ICD-10-CM | POA: Insufficient documentation

## 2024-06-06 DIAGNOSIS — D3912 Neoplasm of uncertain behavior of left ovary: Secondary | ICD-10-CM | POA: Insufficient documentation

## 2024-06-06 DIAGNOSIS — R978 Other abnormal tumor markers: Secondary | ICD-10-CM | POA: Insufficient documentation

## 2024-06-06 LAB — GLUCOSE, CAPILLARY: Glucose-Capillary: 90 mg/dL (ref 70–99)

## 2024-06-06 MED ORDER — FLUDEOXYGLUCOSE F - 18 (FDG) INJECTION
5.9000 | Freq: Once | INTRAVENOUS | Status: AC
  Administered 2024-06-06: 5.9 via INTRAVENOUS

## 2024-06-11 ENCOUNTER — Telehealth: Payer: Self-pay | Admitting: *Deleted

## 2024-06-11 ENCOUNTER — Encounter: Payer: Self-pay | Admitting: Allergy

## 2024-06-11 ENCOUNTER — Ambulatory Visit (INDEPENDENT_AMBULATORY_CARE_PROVIDER_SITE_OTHER): Admitting: Allergy

## 2024-06-11 ENCOUNTER — Ambulatory Visit: Payer: Self-pay | Admitting: Gynecologic Oncology

## 2024-06-11 ENCOUNTER — Other Ambulatory Visit: Payer: Self-pay

## 2024-06-11 VITALS — BP 118/78 | HR 74 | Temp 98.3°F | Resp 16 | Ht 66.5 in | Wt 125.4 lb

## 2024-06-11 DIAGNOSIS — J4520 Mild intermittent asthma, uncomplicated: Secondary | ICD-10-CM | POA: Diagnosis not present

## 2024-06-11 DIAGNOSIS — R198 Other specified symptoms and signs involving the digestive system and abdomen: Secondary | ICD-10-CM

## 2024-06-11 DIAGNOSIS — K219 Gastro-esophageal reflux disease without esophagitis: Secondary | ICD-10-CM

## 2024-06-11 DIAGNOSIS — T781XXD Other adverse food reactions, not elsewhere classified, subsequent encounter: Secondary | ICD-10-CM

## 2024-06-11 DIAGNOSIS — J3089 Other allergic rhinitis: Secondary | ICD-10-CM | POA: Diagnosis not present

## 2024-06-11 DIAGNOSIS — R21 Rash and other nonspecific skin eruption: Secondary | ICD-10-CM | POA: Diagnosis not present

## 2024-06-11 NOTE — Telephone Encounter (Signed)
 Attempted to reach patient to relay results of PET scan. Left voicemail requesting call back.

## 2024-06-11 NOTE — Telephone Encounter (Signed)
 Spoke with Kathryn Park who called the office back and results of PET scan relayed to patient. She states, I saw the results on MyChart last night, thank you so much for calling.   Pt states, everyone in the office has been so helpful -tell Dr. Viktoria thank you as well.

## 2024-06-11 NOTE — Telephone Encounter (Signed)
-----   Message from Kathryn Park sent at 06/11/2024 10:23 AM EDT ----- Please let her know her PET scan results:  1. No evidence for hypermetabolic metastatic disease in the neck, chest, abdomen, or pelvis. 2. Interval resolution of small volume free fluid seen in the right paracolic gutter and pelvis on the previous CT study.

## 2024-06-11 NOTE — Progress Notes (Signed)
 Follow Up Note  RE: Kathryn Park MRN: [id number] DOB: [birth year] Date of Office Visit: 06/11/2024  Referring provider: Job Lukes, PA Primary care provider: Job Lukes, [place]  Chief Complaint: Follow-up (Allergy and Asthma- she is doing good. )  History of Present Illness: I had the pleasure of seeing Kathryn Park for a follow up visit at the [facility] on 06/11/2024. She is a 38 y.o. female, who is being followed for allergic rhinitis, asthma, GERD, adverse food reaction, rash. Her previous allergy office visit was on 03/12/2024 with Dr. Luke. Today is a regular follow up visit.  Discussed the use of AI scribe software for clinical note transcription with the patient, who gave verbal consent to proceed.    She is currently taking cetirizine (Zyrtec) and famotidine  (Pepcid ) twice daily, which effectively manage her allergy symptoms. If she misses a dose at night, she experiences night sweats and wakes up around 3:30 to 4:00 AM. Her environmental allergies are better controlled with these medications, although she experiences stuffiness and itchy eyes if she forgets to take them. She has tried other antihistamines like loratadine (Claritin) and fexofenadine (Allegra) but finds Zyrtec to be the most effective.  She has been diagnosed with small intestinal bacterial overgrowth (SIBO) and has started a course of doxycycline . Remembering to take cromolyn  sodium helps reduce gastrointestinal symptoms such as bloating and feeling full quickly. However, she struggles with consistent use due to her ADHD.   She continues to avoid shellfish due to allergies and manages her environmental allergies by vacuuming frequently, as she lives in an old house with carpet. She is not using saline sprays or eye drops as she no longer wears contacts and is better at not touching her face.  She uses a Qvar inhaler, taking two puffs in the morning and sometimes at  night, which helps manage her breathing symptoms. She uses her rescue inhaler prophylactically before long walks but has not needed it for acute symptoms. She has not required emergency care for her respiratory issues recently.     Assessment and Plan: Kathryn Park is a 38 y.o. female with: Rash  Past history - Diagnosed with Kathryn Park and concerned about mast cell activation. 2023 bloodwork (ANA, alpha gal, CBC diff, CU, C3, C4, CMP, crp, ESR) all unremarkable. Tryptase level 10.9. negative HAT genetic test. Pruritus and rash started many years ago. No specific triggers noted. Worse after showering.  Interim history - overall feeling much better with zyrtec 10mg  BID and famotidine  20mg  BID. Symptomatic with hot flashes and rhinitis symptoms if misses a dose. Claritin and allegra not as effective.  Continue proper skin care. Avoid the following potential triggers: alcohol, tight clothing, NSAIDs, hot showers and getting overheated. Monitor symptoms. Continue cetirizine 10mg  twice a day. Continue famotidine  20mg  twice a day.   Other allergic rhinitis Past history - Flonase  caused epistaxis. No prior AIT. 2023 skin testing positive to dust mites, perennial mold, mouse. Borderline to cat.  Interim history - stable if takes antihistamines.  Continue environmental control measures. Use saline nasal spray.  Use over the counter antihistamines such as Zyrtec (cetirizine), Claritin (loratadine), Allegra (fexofenadine), or Xyzal (levocetirizine) daily as needed. May take twice a day during allergy flares. May switch antihistamines every few months.   Mild persistent asthma without complication Past history - Diagnosed with asthma as a child. Covid-19 in 2023. 2023 spirometry showed: normal pattern with 35% improvement in FEV1 post bronchodilator treatment. Clinically feeling slightly improved.  Interim history - only taking once a day and doing well on it.  Today's spirometry was unremarkable.  Daily  controller medication(s): Qvar 80mcg 2 puffs 1-2 times per day and rinse mouth afterwards. May use albuterol  rescue inhaler 2 puffs every 4 to 6 hours as needed for shortness of breath, chest tightness, coughing, and wheezing. May use albuterol  rescue inhaler 2 puffs 5 to 15 minutes prior to strenuous physical activities. Monitor frequency of use - if you need to use it more than twice per week on a consistent basis let us  know.    Gastroesophageal reflux disease GI complaints Past history - Followed by GI and takes famotidine  and Zofran  prn with some benefit. Apparently had egd and colonoscopy in the past which were unremarkable.  Interim history - tried cromolyn  and seems to help but not taking everyday as she forgets. Diagnosed with SIBO and on antibiotics.  Continue famotidine  20mg  twice a day.  May take cromolyn  5mL before meals and before bedtime and monitor symptoms. You may take it up to 4 times per day.  Keep follow up with GI.  Consider seeing Dr. Zac Spiritos who is a functional GI doctor.   Other adverse food reactions, not elsewhere classified, subsequent encounter Past history - Throat closing after eating crab over 30 years go - unsure of specific details. Rash with watermelon. 2023 skin testing negative to select foods. 2023 bloodwork negative to shellfish and watermelon. tolerates watermelon. Not interested in shellfish reintroduction. Continue to avoid shellfish.  For mild symptoms you can take over the counter antihistamines (zyrtec 10mg  to 20mg ) and monitor symptoms closely.  If symptoms worsen or if you have severe symptoms including breathing issues, throat closure, significant swelling, whole body hives, severe diarrhea and vomiting, lightheadedness then use epinephrine  and seek immediate medical care afterwards. Emergency action plan in place.    Return in about 6 months (around 12/09/2024).  No orders of the defined types were placed in this encounter.  Lab Orders  No  laboratory test(s) ordered today   Diagnostics: Spirometry:  Tracings reviewed. Her effort: It was hard to get consistent efforts and there is a question as to whether this reflects a maximal maneuver. FVC: 4.34L FEV1: 3.38L, 100% predicted FEV1/FVC ratio: 78% Interpretation: No overt abnormalities noted given today's efforts.  Please see scanned spirometry results for details.  Results discussed with patient/family.   Medication List:  Current Outpatient Medications  Medication Sig Dispense Refill   albuterol  (VENTOLIN  HFA) 108 (90 Base) MCG/ACT inhaler Inhale 2 puffs into the lungs every 4 (four) hours as needed for wheezing or shortness of breath. 18 g 1   beclomethasone (QVAR) 80 MCG/ACT inhaler Inhale 2 puffs into the lungs 2 (two) times daily. Rinse mouth after each use. 3 each 3   cetirizine (ZYRTEC) 5 MG tablet Take 5 mg by mouth daily.     cromolyn  (GASTROCROM ) 100 MG/5ML solution Take 5 mLs (100 mg total) by mouth 4 (four) times daily -  before meals and at bedtime. 480 mL 3   cyanocobalamin  1000 MCG tablet Take 1,000 mcg by mouth daily.     doxycycline  (VIBRAMYCIN ) 100 MG capsule Take 1 capsule (100 mg total) by mouth 2 (two) times daily for 10 days. 20 capsule 0   EPINEPHrine  0.3 mg/0.3 mL IJ SOAJ injection Inject 0.3 mg into the muscle as needed for anaphylaxis. 2 each 1   escitalopram  (LEXAPRO ) 20 MG tablet Take 1/2 tablet (10mg ) tablet by mouth daily for 2 weeks out  of cycle 45 tablet 3   famotidine  (PEPCID ) 20 MG tablet Take 1 tablet (20 mg total) by mouth 2 (two) times daily. 60 tablet 3   fexofenadine (ALLEGRA) 180 MG tablet Take 180 mg by mouth in the morning and at bedtime. (Patient taking differently: Take 180 mg by mouth as needed. Alternating with Zyrtec)     Flaxseed, Linseed, (FLAXSEED OIL PO) 1 capsule by Other route daily.     magnesium oxide (MAG-OX) 400 (240 Mg) MG tablet Take 400 mg by mouth daily.     Melatonin 10 MG TABS Take 1 tablet by mouth at bedtime.      ondansetron  (ZOFRAN ) 4 MG tablet Take 1 tablet (4 mg total) by mouth 3 (three) times daily as needed for nausea or vomiting. 30 tablet 1   QUERCETIN PO Take 1 capsule by mouth daily in the afternoon.     Turmeric 500 MG CAPS Take 1 capsule by mouth daily in the afternoon.     valACYclovir  (VALTREX ) 1000 MG tablet Take 2 tablets (2000 mg) by mouth twice a day for 24 hours as needed for cold sore. 30 tablet 1   No current facility-administered medications for this visit.   Allergies: Allergies  Allergen Reactions   Shellfish Allergy Anaphylaxis    Throat closes   Other Other (See Comments)    Watermelon, mold/mildew, pet dander, dust and dust mites  Swelling of eyes and congestion   Latex Rash   Sulfa Antibiotics Rash   I reviewed her past medical history, social history, family history, and environmental history and no significant changes have been reported from her previous visit.  Review of Systems  Constitutional:  Negative for appetite change, chills, fever and unexpected weight change.  HENT:  Negative for congestion and rhinorrhea.   Eyes:  Negative for itching.  Respiratory:  Negative for cough, chest tightness, shortness of breath and wheezing.   Cardiovascular:  Negative for chest pain.  Gastrointestinal:  Negative for abdominal pain, constipation, diarrhea, nausea and vomiting.  Allergic/Immunologic: Positive for environmental allergies.    Objective: BP 118/78 (BP Location: Left Arm, Patient Position: Sitting, Cuff Size: Normal)   Pulse 74   Temp 98.3 F (36.8 C) (Temporal)   Resp 16   Ht 5' 6.5 (1.689 m)   Wt 125 lb 6.4 oz (56.9 kg)   LMP 06/02/2024 (Exact Date)   SpO2 98%   BMI 19.94 kg/m  Body mass index is 19.94 kg/m. Physical Exam Vitals and nursing note reviewed.  Constitutional:      Appearance: Normal appearance. She is well-developed.  HENT:     Head: Normocephalic and atraumatic.     Right Ear: Tympanic membrane and external ear normal.      Left Ear: Tympanic membrane and external ear normal.     Nose: Nose normal.     Mouth/Throat:     Mouth: Mucous membranes are moist.     Pharynx: Oropharynx is clear.  Eyes:     Conjunctiva/sclera: Conjunctivae normal.  Cardiovascular:     Rate and Rhythm: Normal rate and regular rhythm.     Heart sounds: Normal heart sounds. No murmur heard.    No friction rub. No gallop.  Pulmonary:     Effort: Pulmonary effort is normal.     Breath sounds: Normal breath sounds. No wheezing, rhonchi or rales.  Musculoskeletal:     Cervical back: Neck supple.  Skin:    General: Skin is warm.     Findings: No rash.  Neurological:     Mental Status: She is alert and oriented to person, place, and time.  Psychiatric:        Behavior: Behavior normal.    Previous notes and tests were reviewed. The plan was reviewed with the patient/family, and all questions/concerned were addressed.  It was my pleasure to see Kathryn Park today and participate in her care. Please feel free to contact me with any questions or concerns.  Sincerely,  Orlan Cramp, DO Allergy & Immunology  [facility]  [place] office: [phone] [facility] office: [phone]

## 2024-06-11 NOTE — Patient Instructions (Addendum)
 Skin Continue proper skin care. Avoid the following potential triggers: alcohol, tight clothing, NSAIDs, hot showers and getting overheated. Monitor symptoms. Continue cetirizine 10mg  twice a day. Continue famotidine  20mg  twice a day.   Environmental allergies 2023 skin testing positive to dust mites, perennial mold, mouse. Borderline to cat.  Continue environmental control measures.  Use saline nasal spray.   Food Continue to avoid shellfish.  For mild symptoms you can take over the counter antihistamines (zyrtec 10mg  to 20mg ) and monitor symptoms closely.  If symptoms worsen or if you have severe symptoms including breathing issues, throat closure, significant swelling, whole body hives, severe diarrhea and vomiting, lightheadedness then use epinephrine  and seek immediate medical care afterwards. Emergency action plan in place.   GI symptoms Continue famotidine  20mg  twice a day.  May take cromolyn  5mL before meals and before bedtime and monitor symptoms. You may take it up to 4 times per day.  Keep follow up with GI.  Consider seeing Dr. Zac Spiritos who is a functional GI doctor. [email or web address]  Breathing Breathing test unremarkable today.  Daily controller medication(s): Qvar 80mcg 2 puffs 1-2 times per day and rinse mouth afterwards. May use albuterol  rescue inhaler 2 puffs every 4 to 6 hours as needed for shortness of breath, chest tightness, coughing, and wheezing. May use albuterol  rescue inhaler 2 puffs 5 to 15 minutes prior to strenuous physical activities. Monitor frequency of use - if you need to use it more than twice per week on a consistent basis let us  know.  Breathing control goals:  Full participation in all desired activities (may need albuterol  before activity) Albuterol  use two times or less a week on average (not counting use with activity) Cough interfering with sleep two times or less a month Oral steroids no more than  once a year No hospitalizations   Follow up in 6 months or sooner if needed.

## 2024-06-20 NOTE — Progress Notes (Signed)
 38 y.o. G0P0000 Married Caucasian female here for annual exam.    Followed for granulosa cell tumor of left ovary and for PMDD.  Takes Lexapro  20 mg, 1/2 tablet (10 mg) for 2 weeks per month.  She deals with a lot of fatigue from the Lexapro .    Does have hx anxiety and depression.  She is navigating her health issues.   Birth control pills were not helpful for treating heavy menses and dysmenorrhea, so she stopped.  Heavy bleeding is now less.   Dealing with ataxia attacks. Has providers in [place] and [place].  Uses a cane.  Has dysautonomia and Ehlers-Danlos.   PCP: Job Lukes, PA   Patient's last menstrual period was 06/02/2024.     Period Cycle (Days): 28 Period Duration (Days): 4-7 Period Pattern: Regular Menstrual Flow: Moderate, Heavy Menstrual Control: Other (Comment) (Underware and cup) Dysmenorrhea: (!) Moderate Dysmenorrhea Symptoms: Cramping     Sexually active: Yes.    The current method of family planning is vasectomy.    Menopausal hormone therapy:  n/a Exercising: Yes.    Walking, mild strength training  Smoker:  Former   OB History  Gravida Para Term Preterm AB Living  0 0 0 0 0 0  SAB IAB Ectopic Multiple Live Births  0 0 0 0 0     HEALTH MAINTENANCE: Last 2 paps:  08/31/22 neg HR HPV neg, 03/28/18 neg HPV neg  History of abnormal Pap or positive HPV:  no Mammogram:   11/16/23 R Breast - Breast Density Cat D, BIRADS Cat 1 neg.  She knows she is due.  Has insurance issues with being out of network.  Colonoscopy:  02/26/22 Bone Density:  n/a  Result  n/a   Immunization History  Administered Date(s) Administered   Influenza Split 08/01/2019   Influenza,inj,Quad PF,6+ Mos 07/09/2020, 07/06/2021, 06/04/2022   Moderna Covid-19 Fall Seasonal Vaccine 51yrs & older 04/01/2023   Moderna Sars-Covid-2 Vaccination 12/12/2019, 01/08/2020, 09/08/2020   Pfizer Covid-19 Vaccine Bivalent Booster 91yrs & up 07/16/2021   Pfizer(Comirnaty)Fall Seasonal  Vaccine 12 years and older 07/19/2022   Tdap 10/05/2015      reports that she has quit smoking. Her smoking use included cigarettes. She has been exposed to tobacco smoke. She has never used smokeless tobacco. She reports that she does not currently use alcohol. She reports that she does not use drugs.  Past Medical History:  Diagnosis Date   Abnormal Pap smear of cervix    Allergy    Anemia    suspected poor diet; vegetarian during teens   Anxiety    Asthma    childhood   Depression    Dysmenorrhea    Ehlers-Danlos syndrome    Family history of breast cancer    Family history of kidney cancer    Granulosa cell tumor of ovary    History of iron deficiency anemia    age 84   Increased risk of breast cancer    23% lifetime risk   Joint pain    Labral tear of right hip joint    Leukocytosis    Low vitamin D  level    Migraine with aura    never on rx or saw neurology   Ovarian cancer (HCC) 2021   Ovarian cyst    Left   Pneumonia 01/2012    left lower lobe   Pre-diabetes 2015   had a borderline A1c   Small intestinal bacterial overgrowth    STD (sexually transmitted disease)  HSV1   Wears contact lenses    Wears glasses     Past Surgical History:  Procedure Laterality Date   ADENOIDECTOMY     BREAST BIOPSY Right 04/2020   benign fibroadenoma at 6:30   COLPOSCOPY     LAPAROSCOPIC OVARIAN CYSTECTOMY Left 11/13/2019   Procedure: LAPAROSCOPIC OVARIAN CYSTECTOMY/POSSIBLE LEFT OOPHORECTOMY/ COLLECTION OF PELVIC WASHINGS/ks;  Surgeon: Cathlyn JAYSON Nikki Bobie FORBES, MD;  Location: [facility];  Service: Gynecology;  Laterality: Left;  Possible left oophorectomy Colection of pelvic washings To follow first case of the day   LEFT OOPHORECTOMY Left 11/2019   right hip arthroscopy  01/2022   for laberal tear repair   TONSILLECTOMY     UPPER GI ENDOSCOPY      Current Outpatient Medications  Medication Sig Dispense Refill   albuterol  (VENTOLIN  HFA) 108 (90  Base) MCG/ACT inhaler Inhale 2 puffs into the lungs every 4 (four) hours as needed for wheezing or shortness of breath. 18 g 1   beclomethasone (QVAR) 80 MCG/ACT inhaler Inhale 2 puffs into the lungs 2 (two) times daily. Rinse mouth after each use. 3 each 3   cetirizine (ZYRTEC) 5 MG tablet Take 5 mg by mouth daily.     EPINEPHrine  0.3 mg/0.3 mL IJ SOAJ injection Inject 0.3 mg into the muscle as needed for anaphylaxis. 2 each 1   escitalopram  (LEXAPRO ) 20 MG tablet Take 1/2 tablet (10mg ) tablet by mouth daily for 2 weeks out of cycle 45 tablet 3   famotidine  (PEPCID ) 20 MG tablet Take 1 tablet (20 mg total) by mouth 2 (two) times daily. 60 tablet 3   fexofenadine (ALLEGRA) 180 MG tablet Take 180 mg by mouth in the morning and at bedtime.     Flaxseed, Linseed, (FLAXSEED OIL PO) 1 capsule by Other route daily.     magnesium oxide (MAG-OX) 400 (240 Mg) MG tablet Take 400 mg by mouth daily.     Melatonin 10 MG TABS Take 1 tablet by mouth at bedtime.     ondansetron  (ZOFRAN ) 4 MG tablet Take 1 tablet (4 mg total) by mouth 3 (three) times daily as needed for nausea or vomiting. 30 tablet 1   QUERCETIN PO Take 1 capsule by mouth daily in the afternoon.     Turmeric 500 MG CAPS Take 1 capsule by mouth daily in the afternoon.     valACYclovir  (VALTREX ) 1000 MG tablet Take 2 tablets (2000 mg) by mouth twice a day for 24 hours as needed for cold sore. 30 tablet 1   cromolyn  (GASTROCROM ) 100 MG/5ML solution Take 5 mLs (100 mg total) by mouth 4 (four) times daily -  before meals and at bedtime. (Patient not taking: Reported on 06/21/2024) 480 mL 3   cyanocobalamin  1000 MCG tablet Take 1,000 mcg by mouth daily. (Patient not taking: Reported on 06/21/2024)     No current facility-administered medications for this visit.    ALLERGIES: Shellfish allergy, Other, Latex, and Sulfa antibiotics  Family History  Problem Relation Age of Onset   Asthma Mother    Allergic rhinitis Mother    Diabetes Mother     Hypertension Mother    Stroke Mother        TIA   Factor V Leiden deficiency Mother    Other Mother        pituitary tumor   Colon polyps Mother    Diverticulitis Mother    Allergic rhinitis Father    Eczema Father    Kidney  cancer Father        cancerous tumor of kidney   Ulcerative colitis Father    Allergic rhinitis Brother    Eczema Brother    Other Brother        ear tumor, dx. age 73-6   Breast cancer Maternal Grandmother        dx. 80s   Stroke Maternal Grandfather    Rheum arthritis Maternal Great-grandmother    Cancer Other    Ovarian cancer Neg Hx    Endometrial cancer Neg Hx    Colon cancer Neg Hx     Review of Systems  See HPI.   PHYSICAL EXAM:  BP 118/80 (BP Location: Left Arm, Patient Position: Sitting, Cuff Size: Normal)   Pulse 67   Ht 5' 7.5 (1.715 m)   Wt 126 lb (57.2 kg)   LMP 06/02/2024   SpO2 99%   BMI 19.44 kg/m     General appearance: alert, cooperative and appears stated age Head: normocephalic, without obvious abnormality, atraumatic Neck: no adenopathy, supple, symmetrical, trachea midline and thyroid  normal to inspection and palpation Lungs: clear to auscultation bilaterally Breasts: right - normal appearance, prominence at 11:00, 7 - 8 mm.   tenderness, No nipple retraction or dimpling, No nipple discharge or bleeding, No axillary adenopathy Left - normal appearance, no masses or tenderness, No nipple retraction or dimpling, No nipple discharge or bleeding, No axillary adenopathy Heart: regular rate and rhythm Abdomen: soft, non-tender; no masses, no organomegaly Extremities: extremities normal, atraumatic, no cyanosis or edema Skin: skin color, texture, turgor normal. No rashes or lesions Lymph nodes: cervical, supraclavicular, and axillary nodes normal. Neurologic: grossly normal  Pelvic: External genitalia:  no lesions              No abnormal inguinal nodes palpated.              Urethra:  normal appearing urethra with no masses,  tenderness or lesions              Bartholins and Skenes: normal                 Vagina: normal appearing vagina with normal color and discharge, no lesions              Cervix: no lesions              Pap taken: no Bimanual Exam:  Uterus:  normal size, contour, position, consistency, mobility, non-tender              Adnexa: no mass, fullness, tenderness              Chaperone was present for exam:  Kari HERO, CMA  ASSESSMENT: Well woman visit with gynecologic exam. Status post laparoscopic left oophorectomy.  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  11/2019.  No evidence of recurrence.   Normal PET scan 06/06/24.   Seen by GYN ONC every 6 months.  At increased risk for breast cancer.  Lifetime risk 19 - 23.0%. Hx right breast fibroadenoma.   Prominence at 11:00, 7 - 8 mm.  Previously imaged and normal 11/16/23. FH breast cancer. Migraine with aura. Anxiety.  PMDD.  On Lexapro  10 mg daily for 2 weeks per month.  Ehler Danlos.  Ataxia.   PHQ-2-9: 0 Oral HSV.   Declines refill of Valtrex .   PLAN: Mammogram screening recommended.  Locations for mammogram discussed.  We briefly discussed breast MRI.  Not ordered.  Self breast awareness  reviewed. Pap and HRV collected:  no.   Guidelines for Calcium, Vitamin D , regular exercise program including cardiovascular and weight bearing exercise. Medication refills:  Lexapro  10 mg tablet, take 1/2 tab (5 mg) daily.  We talked about emotional support with counseling through the [facility].  She will consider.  Follow up:  yearly.

## 2024-06-21 ENCOUNTER — Encounter: Payer: Self-pay | Admitting: Obstetrics and Gynecology

## 2024-06-21 ENCOUNTER — Ambulatory Visit: Admitting: Obstetrics and Gynecology

## 2024-06-21 VITALS — BP 118/80 | HR 67 | Ht 67.5 in | Wt 126.0 lb

## 2024-06-21 DIAGNOSIS — Z01419 Encounter for gynecological examination (general) (routine) without abnormal findings: Secondary | ICD-10-CM | POA: Diagnosis not present

## 2024-06-21 DIAGNOSIS — F3281 Premenstrual dysphoric disorder: Secondary | ICD-10-CM | POA: Diagnosis not present

## 2024-06-21 DIAGNOSIS — F32A Depression, unspecified: Secondary | ICD-10-CM

## 2024-06-21 DIAGNOSIS — F419 Anxiety disorder, unspecified: Secondary | ICD-10-CM

## 2024-06-21 DIAGNOSIS — B009 Herpesviral infection, unspecified: Secondary | ICD-10-CM

## 2024-06-21 DIAGNOSIS — Z1331 Encounter for screening for depression: Secondary | ICD-10-CM | POA: Diagnosis not present

## 2024-06-21 MED ORDER — ESCITALOPRAM OXALATE 10 MG PO TABS: ORAL_TABLET | ORAL | 3 refills | Status: AC

## 2024-06-21 NOTE — Patient Instructions (Signed)

## 2024-06-25 ENCOUNTER — Other Ambulatory Visit: Payer: Self-pay

## 2024-06-25 ENCOUNTER — Ambulatory Visit: Admitting: Family Medicine

## 2024-06-25 VITALS — BP 144/86 | HR 85 | Ht 67.5 in | Wt 128.0 lb

## 2024-06-25 DIAGNOSIS — Q796 Ehlers-Danlos syndrome, unspecified: Secondary | ICD-10-CM

## 2024-06-25 DIAGNOSIS — M25511 Pain in right shoulder: Secondary | ICD-10-CM

## 2024-06-25 DIAGNOSIS — G8929 Other chronic pain: Secondary | ICD-10-CM | POA: Diagnosis not present

## 2024-06-25 MED ORDER — NALTREXONE POWD: 2.5000 mg | Freq: Every day | 12 refills | Status: DC

## 2024-06-25 MED ORDER — KETOROLAC TROMETHAMINE 60 MG/2ML IM SOLN
60.0000 mg | Freq: Once | INTRAMUSCULAR | Status: AC
  Administered 2024-06-25: 60 mg via INTRAMUSCULAR

## 2024-06-25 MED ORDER — NALTREXONE POWD: 2.5000 mg | Freq: Every day | 12 refills | Status: AC

## 2024-06-25 NOTE — Progress Notes (Signed)
 LILLETTE Ileana Collet, PhD, LAT, ATC acting as a scribe for Artist Lloyd, MD.  Kathryn Park is a 38 y.o. female who presents to [facility] today for R shoulder pain. Pt was last for R shoulder on 10/28/23 and was referred to PT  Today, pt c/o exacerbation of her R shoulder pain continues. Pain is worse when she is having an EDS flare. She did not find prior PT very helpful.    Radiates: yes w/ n/t and mechanical symptoms.   Dx testing: 06/08/23 R shoulder XR  Pertinent review of systems: No fevers or chills  Relevant historical information:  Granulosa cell tumor left ovary history.  Ehlers-Danlos history.  Ataxia history.   Exam:  BP (!) 144/86   Pulse 85   Ht 5' 7.5 (1.715 m)   Wt 128 lb (58.1 kg)   LMP 06/02/2024   SpO2 97%   BMI 19.75 kg/m  General: Well Developed, well nourished, and in no acute distress.   MSK: Right shoulder normal range of motion.  Intact strength.  Positive Hawkins and Neer's test.    Lab and Radiology Results  Procedure: Real-time Ultrasound Guided Injection of right shoulder subacromial bursa Device: Philips Affiniti 50G/GE Logiq Images permanently stored and available for review in PACS Verbal informed consent obtained.  Discussed risks and benefits of procedure. Warned about infection, bleeding, hyperglycemia damage to structures among others. Patient expresses understanding and agreement Time-out conducted.   Noted no overlying erythema, induration, or other signs of local infection.   Skin prepped in a sterile fashion.   Local anesthesia: Topical Ethyl chloride.   With sterile technique and under real time ultrasound guidance: 40 mg of Kenalog  and 2 mL of Marcaine  injected into subacromial bursa. Fluid seen entering the bursa.   Completed without difficulty   Pain moderately immediately resolved suggesting accurate placement of the medication.  Advised to call if fevers/chills, erythema,  induration, drainage, or persistent bleeding.   Images permanently stored and available for review in the ultrasound unit.  Impression: Technically successful ultrasound guided injection.         Assessment and Plan: 38 y.o. female with chronic right shoulder pain occurring in the setting of Ehlers-Danlos and hypermobility.  Plan for physical therapy.  She is not sure exactly where she would like to go and will let me know in the near future and we can modify and change orders if needed.  Additionally proceeded with a subacromial injection and will give Toradol  IM prior to discharge.  For her overall pain she has done some research and is interested in trying low-dose naltrexone .  This was prescribed to a compounding pharmacy here in [place].  She will keep me updated with how she feels and we can change plans on the flight.  PDMP not reviewed this encounter. Orders Placed This Encounter  Procedures   US  LIMITED JOINT SPACE STRUCTURES UP RIGHT(NO LINKED CHARGES)    Reason for Exam (SYMPTOM  OR DIAGNOSIS REQUIRED):   right shoulder pain    Preferred imaging location?:   [facility]   Meds ordered this encounter  Medications   Naltrexone  POWD    Sig: 2.5 mg by Does not apply route daily.    Dispense:  100 g    Refill:  12   ketorolac  (TORADOL ) injection 60 mg     Discussed warning signs or symptoms. Please see discharge instructions. Patient expresses understanding.   The above documentation has been  reviewed and is accurate and complete Artist Lloyd, M.D.

## 2024-06-25 NOTE — Patient Instructions (Addendum)
 Thank you for coming in today.   You received an injection today. Seek immediate medical attention if the joint becomes red, extremely painful, or is oozing fluid.   Let me know which physical therapy location you prefer  Intra-muscular Ketorolac  injection today.

## 2024-08-01 NOTE — Progress Notes (Unsigned)
 08/02/2024 Kathryn Park [id number] 23-Dec-1985  Referring provider: Job Lukes, PA Primary GI doctor: Dr. Federico  ASSESSMENT AND PLAN:  Nausea PET scan negative for paraneoplastic Negative POTS 2024 negative MRI brain with and without 2023 EGD normal esophagus, gastritis duodenal erosions without bleeding gastric metaplasia compatible with peptic duodenitis negative H. pylori metaplasia negative EOE 05/15/2024 GES normal 05/2024 positive SIBO rifaximin  denied started on doxycycline  100 mg twice daily for 10 days with improvement - retreat SIBO if symptoms return, will message - consider trying to get rifaximin  - follow up neurology for possible migraines as a possibility for nausea/ataxia/symptoms  - get KUB to evaluate for stool burden  Alternating diarrhea constipation, constipation more prominent - get KUB, evaluate for stool burden - consider pelvic floor PT/linzess pending results  Ehlers-Danlos Continue follow up  Anemia 01/23/2024  HGB 13.5 MCV 86.7 Platelets 203.0 02/23/2022 Iron 84 Ferritin 19.7 B12 757 Recent Labs    01/23/24 1353  HGB 13.5   History of ovarian cancer status post left nephrectomy Being followed by hematology 05/23/2024 CT chest abdomen pelvis with contrast no evidence of local recurrence or metastatic disease small amount of ascites no peritoneal nodularity no significant findings in the chest 06/06/2024 PET scan no evidence of metastasis  Personal history of colon polyps 02/26/2022 colonoscopy 3 polyps 3 to 12 mm transverse ascending colon nonbleeding internal hemorrhoids Recall 02/2025  Patient Care Team: Job Lukes, [place] as PCP - General (Physician Assistant) Viktoria Comer SAUNDERS, MD as Consulting Physician (Gynecologic Oncology)  HISTORY OF PRESENT ILLNESS: 38 y.o. female with a past medical history listed below presents for evaluation of nausea.   Patient last seen in the office 05/03/2024 by Dr. Federico for  nausea. She has been working with allergist Dr. Luke for evaluation of MCAS. She has been trying to take Cromolyn  for MCAS, though this has been difficult to keep up with at times.  She saw Dr. Viktoria and was told that she may need a PET/CT scan in the future to rule out a paraneoplastic syndrome. Zofran  has helped with nausea. She saw endocrinology and she did a cosyntropin  stim test and that was normal so she does not have adrenal insufficiency  . Discussed the use of AI scribe software for clinical note transcription with the patient, and patient declines.  She was treated with the doxycycline  that has helped about 60-65 % of her nausea. Bloating has improved some. May have helped bowel movements some.  She has been seen by allergist, on cromyln and allergy pill that is helping. Still takes zofran . She has had ataxia that is being worked up, negative PET. She has a history of migraines with aura when she was younger, has some migraines now, will have to wear migraine glasses once a week at least. Mother with migraines. Normal MRI brain 2024.  She has BM once daily, small volume, no hematochezia or issues.   She  reports that she has quit smoking. Her smoking use included cigarettes. She has been exposed to tobacco smoke. She has never used smokeless tobacco. She reports that she does not currently use alcohol. She reports that she does not use drugs.  RELEVANT GI HISTORY, IMAGING AND LABS: Results         Labs 12/2021: CBC with low Hb of 11.8. CMP unremarkable.   Labs 02/2022: CBC normal.  Ferritin/IBC normal.   Labs 09/2022: CBC unremarkable.  Alpha gal panel was negative.  C3/C4 were normal.  Tryptase was normal.  CRP and ESR were normal.  TSH normal.  Vitamin B12 normal.  ANA negative.   Labs 01/2024: CBC nml. CMP nml. Hep B surface antibody positive.   CT A/P w/contrast 02/17/22: IMPRESSION: 1. Along the left posterior uterine margin, a 2.4 cc mixed density structure with high density  component is observed. Questionably present a lower in density on 11/29/2019. Possibilities include scarring, ovarian remnant, small serosal fibroid, or less likely recurrent tumor. If clinically warranted, complimentary assessment with pelvic MRI with and without contrast might be considered. 2. Although the aortomesenteric distance is reduced at 5 mm, the aortomesenteric angle is within normal limits 35 degrees and accordingly superior mesenteric artery syndrome is considered less likely.   RUQ U/S 06/17/23: IMPRESSION: No acute abnormality identified.   EGD 02/26/22: - Normal esophagus. Biopsied.  - Gastritis. Biopsied.  - Duodenal erosions without bleeding. Biopsied. Path: 1. Surgical [P], duodenal biopsies REACTIVE DUODENAL MUCOSA WITH GASTRIC METAPLASIA COMPATIBLE WITH PEPTIC DUODENITIS 2. Surgical [P], gastric biopsies REACTIVE GASTROPATHY NEGATIVE FOR H. PYLORI, INTESTINAL METAPLASIA, DYSPLASIA AND CARCINOMA 3. Surgical [P], esophageal biopsies MILDLY REACTIVE SQUAMOUS MUCOSA NEGATIVE FOR GLANDULAR EPITHELIUM, EOSINOPHILS, DYSPLASIA AND CARCINOMA   Colonoscopy 02/26/22: - The examined portion of the ileum was normal.  - Three 3 to 12 mm polyps in the transverse colon and in the ascending colon, removed with a cold snare. Resected and retrieved.  - Non- bleeding internal hemorrhoids. Path: 4. Surgical [P], colon, ascending and transverse, polyp (3) SESSILE SERRATED POLYP WITHOUT CYTOLOGIC DYSPLASIA, 2 FRAGMENTS BENIGN LYMPHOID AGGREGATES CBC    Component Value Date/Time   WBC 8.6 01/23/2024 1353   RBC 4.63 01/23/2024 1353   HGB 13.5 01/23/2024 1353   HGB 13.9 09/29/2022 1127   HCT 40.2 01/23/2024 1353   HCT 42.3 09/29/2022 1127   PLT 203.0 01/23/2024 1353   PLT 256 09/29/2022 1127   MCV 86.7 01/23/2024 1353   MCV 88 09/29/2022 1127   MCH 29.0 09/29/2022 1127   MCH 29.0 09/04/2020 1150   MCH 29.5 09/04/2020 1150   MCHC 33.5 01/23/2024 1353   RDW 12.9 01/23/2024  1353   RDW 12.2 09/29/2022 1127   LYMPHSABS 3.6 01/23/2024 1353   LYMPHSABS 3.5 (H) 09/29/2022 1127   MONOABS 0.6 01/23/2024 1353   EOSABS 0.2 01/23/2024 1353   EOSABS 0.2 09/29/2022 1127   BASOSABS 0.1 01/23/2024 1353   BASOSABS 0.1 09/29/2022 1127   Recent Labs    01/23/24 1353  HGB 13.5    CMP     Component Value Date/Time   NA 139 01/23/2024 1353   NA 139 09/29/2022 1127   K 3.8 01/23/2024 1353   CL 107 01/23/2024 1353   CO2 25 01/23/2024 1353   GLUCOSE 92 01/23/2024 1353   BUN 8 01/23/2024 1353   BUN 13 09/29/2022 1127   CREATININE 0.70 01/23/2024 1353   CREATININE 0.75 09/04/2020 1150   CALCIUM 9.4 01/23/2024 1353   PROT 7.0 01/23/2024 1353   PROT 7.3 09/29/2022 1127   ALBUMIN 4.5 01/23/2024 1353   ALBUMIN 4.8 09/29/2022 1127   AST 13 01/23/2024 1353   AST 11 (L) 09/04/2020 1150   ALT 8 01/23/2024 1353   ALT 8 09/04/2020 1150   ALKPHOS 43 01/23/2024 1353   BILITOT 0.5 01/23/2024 1353   BILITOT 0.2 09/29/2022 1127   BILITOT 0.3 09/04/2020 1150   GFRNONAA >60 09/04/2020 1150   GFRAA 106 10/11/2019 1013      Latest Ref Rng & Units 01/23/2024    1:53 PM  09/29/2022   11:27 AM 12/23/2021    3:07 PM  Hepatic Function  Total Protein 6.0 - 8.3 g/dL 7.0  7.3  6.3   Albumin 3.5 - 5.2 g/dL 4.5  4.8  4.4   AST 0 - 37 U/L 13  21  14    ALT 0 - 35 U/L 8  13  11    Alk Phosphatase 39 - 117 U/L 43  60  46   Total Bilirubin 0.2 - 1.2 mg/dL 0.5  0.2  0.3       Current Medications:    Current Outpatient Medications (Cardiovascular):    EPINEPHrine  0.3 mg/0.3 mL IJ SOAJ injection, Inject 0.3 mg into the muscle as needed for anaphylaxis.  Current Outpatient Medications (Respiratory):    albuterol  (VENTOLIN  HFA) 108 (90 Base) MCG/ACT inhaler, Inhale 2 puffs into the lungs every 4 (four) hours as needed for wheezing or shortness of breath.   beclomethasone (QVAR) 80 MCG/ACT inhaler, Inhale 2 puffs into the lungs 2 (two) times daily. Rinse mouth after each use.    cetirizine (ZYRTEC) 5 MG tablet, Take 5 mg by mouth daily.   fexofenadine (ALLEGRA) 180 MG tablet, Take 180 mg by mouth in the morning and at bedtime.   Current Outpatient Medications (Hematological):    cyanocobalamin  1000 MCG tablet, Take 1,000 mcg by mouth daily. (Patient not taking: Reported on 08/02/2024)  Current Outpatient Medications (Other):    cromolyn  (GASTROCROM ) 100 MG/5ML solution, Take 5 mLs (100 mg total) by mouth 4 (four) times daily -  before meals and at bedtime.   escitalopram  (LEXAPRO ) 10 MG tablet, Take 1/2 tablet (5 mg) daily.   famotidine  (PEPCID ) 20 MG tablet, Take 1 tablet (20 mg total) by mouth 2 (two) times daily.   Flaxseed, Linseed, (FLAXSEED OIL PO), 1 capsule by Other route daily.   magnesium oxide (MAG-OX) 400 (240 Mg) MG tablet, Take 400 mg by mouth daily.   Melatonin 10 MG TABS, Take 1 tablet by mouth at bedtime.   Naltrexone  POWD, Take 2.5 mg by mouth daily.   ondansetron  (ZOFRAN ) 4 MG tablet, Take 1 tablet (4 mg total) by mouth 3 (three) times daily as needed for nausea or vomiting.   QUERCETIN PO, Take 1 capsule by mouth daily in the afternoon.   valACYclovir  (VALTREX ) 1000 MG tablet, Take 2 tablets (2000 mg) by mouth twice a day for 24 hours as needed for cold sore.   Vitamin D -Vitamin K (D3 + K2 PO), Take by mouth. One daily  Medical History:  Past Medical History:  Diagnosis Date   Abnormal Pap smear of cervix    Allergy    Anemia    suspected poor diet; vegetarian during teens   Anxiety    Asthma    childhood   Depression    Dysmenorrhea    Ehlers-Danlos syndrome    Family history of breast cancer    Family history of kidney cancer    Granulosa cell tumor of ovary    History of iron deficiency anemia    age 54   Increased risk of breast cancer    23% lifetime risk   Joint pain    Labral tear of right hip joint    Leukocytosis    Low vitamin D  level    Migraine with aura    never on rx or saw neurology   Ovarian cancer (HCC) 2021    Ovarian cyst    Left   Pneumonia 01/2012    left lower lobe  Pre-diabetes 2015   had a borderline A1c   Small intestinal bacterial overgrowth    STD (sexually transmitted disease)    HSV1   Wears contact lenses    Wears glasses    Allergies:  Allergies  Allergen Reactions   Shellfish Allergy Anaphylaxis    Throat closes   Other Other (See Comments)    Watermelon, mold/mildew, pet dander, dust and dust mites  Swelling of eyes and congestion   Latex Rash   Sulfa Antibiotics Rash     Surgical History:  She  has a past surgical history that includes Colposcopy; Tonsillectomy; Upper gi endoscopy; Laparoscopic ovarian cystectomy (Left, 11/13/2019); Breast biopsy (Right, 04/2020); Left oophorectomy (Left, 11/2019); right hip arthroscopy (01/2022); and Adenoidectomy. Family History:  Her family history includes Allergic rhinitis in her brother, father, and mother; Asthma in her mother; Breast cancer in her maternal grandmother; Cancer in an other family member; Colon polyps in her mother; Diabetes in her mother; Diverticulitis in her mother; Eczema in her brother and father; Factor V Leiden deficiency in her mother; Hypertension in her mother; Kidney cancer in her father; Other in her brother and mother; Rheum arthritis in her maternal great-grandmother; Stroke in her maternal grandfather and mother; Ulcerative colitis in her father.  REVIEW OF SYSTEMS  : All other systems reviewed and negative except where noted in the History of Present Illness.  PHYSICAL EXAM: BP 116/68   Pulse 60   Ht 5' 6 (1.676 m)   Wt 122 lb (55.3 kg)   BMI 19.69 kg/m  General Appearance: Well nourished, in no apparent distress. Respiratory: Respiratory effort normal, BS equal bilaterally without rales, rhonchi, wheezing. Cardio: RRR with no MRGs. Abdomen: Soft,  Non-distended ,hypoactive bowel sounds. mild tenderness in the LLQ. Without guarding and Without rebound. No masses. Rectal: Not  evaluated Musculoskeletal: Full ROM, Normal gait Neuro: Alert and  oriented x4;  No focal deficits. Psych:  Cooperative. Normal mood and affect.    Alan JONELLE Coombs, PA-C 10:42 AM

## 2024-08-02 ENCOUNTER — Ambulatory Visit: Admitting: Physician Assistant

## 2024-08-02 ENCOUNTER — Ambulatory Visit (INDEPENDENT_AMBULATORY_CARE_PROVIDER_SITE_OTHER)
Admission: RE | Admit: 2024-08-02 | Discharge: 2024-08-02 | Disposition: A | Discharge status: Home / Self Care | Source: Ambulatory Visit | Attending: Physician Assistant | Admitting: Physician Assistant

## 2024-08-02 ENCOUNTER — Encounter: Payer: Self-pay | Admitting: Physician Assistant

## 2024-08-02 VITALS — BP 116/68 | HR 60 | Ht 66.0 in | Wt 122.0 lb

## 2024-08-02 DIAGNOSIS — R198 Other specified symptoms and signs involving the digestive system and abdomen: Secondary | ICD-10-CM

## 2024-08-02 DIAGNOSIS — R112 Nausea with vomiting, unspecified: Secondary | ICD-10-CM

## 2024-08-02 DIAGNOSIS — Q796 Ehlers-Danlos syndrome, unspecified: Secondary | ICD-10-CM | POA: Diagnosis not present

## 2024-08-02 DIAGNOSIS — K219 Gastro-esophageal reflux disease without esophagitis: Secondary | ICD-10-CM | POA: Diagnosis not present

## 2024-08-02 NOTE — Patient Instructions (Addendum)
 Your provider has requested that you have an abdominal x ray before leaving today. Please go to the basement floor to our Radiology department for the test.  Consider evaluation for migraines  Small intestinal bacterial overgrowth (SIBO) occurs when there is an abnormal increase in the overall bacterial population in the small intestine -- particularly types of bacteria not commonly found in that part of the digestive tract. Small intestinal bacterial overgrowth (SIBO) commonly results when a circumstance -- such as surgery or disease -- slows the passage of food and waste products in the digestive tract, creating a breeding ground for bacteria.  Signs and symptoms of SIBO often include: Loss of appetite Abdominal pain Nausea Bloating An uncomfortable feeling of fullness after eating Diarrhea or constipation, depending on the type of gas produced  What foods trigger SIBO? While foods aren't the original cause of SIBO, certain foods do encourage the overgrowth of the wrong bacteria in your small intestine. If you're feeding them their favorite foods, they're going to grow more, and that will trigger more of your SIBO symptoms. By the same token, you can help reduce the overgrowth by starving the problematic bacteria of their favorite foods. This strategy has led to a number of proposed SIBO eating plans. The plans vary, and so do individual results. But in general, they tend to recommend limiting carbohydrates.  These include: Sugars and sweeteners. Fruits and starchy vegetables. Dairy products. Grains.  Can retreat with antibiotics Your symptoms are very suspicious for this condition, as discussed, we will start you on an antibiotic to see if this helps.

## 2024-08-03 ENCOUNTER — Ambulatory Visit: Payer: Self-pay | Admitting: Physician Assistant

## 2024-09-05 ENCOUNTER — Inpatient Hospital Stay

## 2024-09-05 ENCOUNTER — Other Ambulatory Visit: Payer: Self-pay | Admitting: Gynecologic Oncology

## 2024-09-05 DIAGNOSIS — D3912 Neoplasm of uncertain behavior of left ovary: Secondary | ICD-10-CM

## 2024-09-06 ENCOUNTER — Telehealth: Payer: Self-pay | Admitting: *Deleted

## 2024-09-06 NOTE — Telephone Encounter (Signed)
 Patient called and moved her appts from 12/5 to 12/19

## 2024-09-07 ENCOUNTER — Ambulatory Visit: Admitting: Gynecologic Oncology

## 2024-09-07 ENCOUNTER — Inpatient Hospital Stay

## 2024-09-21 ENCOUNTER — Encounter: Payer: Self-pay | Admitting: Gynecologic Oncology

## 2024-09-21 ENCOUNTER — Inpatient Hospital Stay: Admitting: Gynecologic Oncology

## 2024-09-21 ENCOUNTER — Inpatient Hospital Stay: Attending: Gynecologic Oncology

## 2024-09-21 VITALS — BP 125/81 | HR 98 | Temp 99.0°F | Resp 19 | Wt 120.2 lb

## 2024-09-21 DIAGNOSIS — R270 Ataxia, unspecified: Secondary | ICD-10-CM | POA: Insufficient documentation

## 2024-09-21 DIAGNOSIS — R634 Abnormal weight loss: Secondary | ICD-10-CM

## 2024-09-21 DIAGNOSIS — D3912 Neoplasm of uncertain behavior of left ovary: Secondary | ICD-10-CM

## 2024-09-21 DIAGNOSIS — Z87891 Personal history of nicotine dependence: Secondary | ICD-10-CM | POA: Insufficient documentation

## 2024-09-21 DIAGNOSIS — C562 Malignant neoplasm of left ovary: Secondary | ICD-10-CM

## 2024-09-21 DIAGNOSIS — R978 Other abnormal tumor markers: Secondary | ICD-10-CM

## 2024-09-21 DIAGNOSIS — G901 Familial dysautonomia [Riley-Day]: Secondary | ICD-10-CM

## 2024-09-21 NOTE — Patient Instructions (Addendum)
 It was good to see you today.  I do not feel any evidence of granulosa cell tumor recurrence on exam.  We will plan on follow-up pelvic ultrasound in February.  We will tentatively get you scheduled for surgery on December 26, 2024.  If you would like to change this closer to the date, please let my office know.  Otherwise I will see you for a preoperative visit before the surgery.  You may also receive a phone call from the hospital to arrange for a pre-op appointment there as well. Usually both appointments can be combined on the same day.

## 2024-09-21 NOTE — Progress Notes (Signed)
 Gynecologic Oncology Return Clinic Visit  09/21/2024  Reason for Visit: surveillance in the setting of granulosa cell tumor   Treatment History: Oncology History  Granulosa cell tumor of left ovary  10/18/2019 Imaging   Pelvic ultrasound: 11 x 7 x 9 cm left ovarian cyst, avascular with thin septation.  No free fluid.  Right ovary normal in appearance.   11/13/2019 Surgery   Laparoscopic left oophorectomy with collection of pelvic washings, lysis of adhesion.  Findings at the time of surgery were a 12 cm left ovarian cyst.  No ascites or intra-abdominal/pelvic disease.  Ovary was removed in a contained fashion and in an Endo Catch bag.   11/13/2019 Pathology Results   Left ovary showing granulosa cell tumor, 2.1 cm.  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 Pelvic washings negative for malignant cells.   11/13/2019 Initial Diagnosis   Granulosa cell tumor of left ovary   11/29/2019 Imaging   CT A/P: No acute findings in the abdomen or pelvis.  Specifically, no evidence for metastatic disease in the abdomen or pelvis. Trace free fluid in the cul-de-sac.  This can be physiologic in a premenopausal female.   12/27/2019 Tumor Marker   Patient's blood was tested for the following markers: Inh B, AMH Results of the tumor marker test revealed: 83, 1.07.     Interval History: Overall doing well, although continues to struggle with cyclic symptoms.  Endorses feeling good for 2 weeks of the month and then has cycle dependent symptoms related to her EDS, PMDD, and dysautonomia.  Continues to have regular cycles without significant change.  Notes 1 day of flulike symptoms right as her menstrual cycle is starting.  Also has noticed increased joint pain of her problem joints that seems to be cycle dependent.  Has had a couple of months of more urinary frequency.  Past Medical/Surgical History: Past Medical History:  Diagnosis Date   Abnormal Pap smear of cervix     Allergy    Anemia    suspected poor diet; vegetarian during teens   Anxiety    Asthma    childhood   Depression    Dysmenorrhea    Ehlers-Danlos syndrome    Family history of breast cancer    Family history of kidney cancer    Granulosa cell tumor of ovary    History of iron deficiency anemia    age 38   Increased risk of breast cancer    23% lifetime risk   Joint pain    Labral tear of right hip joint    Leukocytosis    Low vitamin D  level    Migraine with aura    never on rx or saw neurology   Ovarian cancer (HCC) 2021   Ovarian cyst    Left   Pneumonia 01/2012    left lower lobe   Pre-diabetes 2015   had a borderline A1c   Small intestinal bacterial overgrowth    STD (sexually transmitted disease)    HSV1   Wears contact lenses    Wears glasses     Past Surgical History:  Procedure Laterality Date   ADENOIDECTOMY     BREAST BIOPSY Right 04/2020   benign fibroadenoma at 6:30   COLPOSCOPY     LAPAROSCOPIC OVARIAN CYSTECTOMY Left 11/13/2019   Procedure: LAPAROSCOPIC OVARIAN CYSTECTOMY/POSSIBLE LEFT OOPHORECTOMY/ COLLECTION OF PELVIC WASHINGS/ks;  Surgeon: Cathlyn JAYSON Nikki Bobie FORBES, MD;  Location: [facility];  Service: Gynecology;  Laterality: Left;  Possible left  oophorectomy Colection of pelvic washings To follow first case of the day   LEFT OOPHORECTOMY Left 11/2019   right hip arthroscopy  01/2022   for laberal tear repair   TONSILLECTOMY     UPPER GI ENDOSCOPY      Family History  Problem Relation Age of Onset   Asthma Mother    Allergic rhinitis Mother    Diabetes Mother    Hypertension Mother    Stroke Mother        TIA   Factor V Leiden deficiency Mother    Other Mother        pituitary tumor   Colon polyps Mother    Diverticulitis Mother    Allergic rhinitis Father    Eczema Father    Kidney cancer Father        cancerous tumor of kidney   Ulcerative colitis Father    Allergic rhinitis Brother    Eczema Brother     Other Brother        ear tumor, dx. age 84-6   Breast cancer Maternal Grandmother        dx. 80s   Stroke Maternal Grandfather    Rheum arthritis Maternal Great-grandmother    Cancer Other    Ovarian cancer Neg Hx    Endometrial cancer Neg Hx    Colon cancer Neg Hx     Social History   Socioeconomic History   Marital status: Married    Spouse name: Not on file   Number of children: 0   Years of education: Not on file   Highest education level: Not on file  Occupational History   Occupation: self employed  Tobacco Use   Smoking status: Former    Current packs/day: 1.00    Types: Cigarettes    Passive exposure: Past   Smokeless tobacco: Never  Vaping Use   Vaping status: Never Used  Substance and Sexual Activity   Alcohol use: Not Currently   Drug use: Never   Sexual activity: Yes    Partners: Male    Birth control/protection: Surgical    Comment: vasectomy  Other Topics Concern   Not on file  Social History Narrative   Moved from [place]   Into consulting civil engineer   Married   Working part-time in engineering geologist   Social Drivers of Health   Tobacco Use: Medium Risk (09/21/2024)   Patient History    Smoking Tobacco Use: Former    Smokeless Tobacco Use: Never    Passive Exposure: Past  Physicist, Medical Strain: Not on [employer] Insecurity: Not on file  Transportation Needs: Not on file  Physical Activity: Not on file  Stress: Not on file  Social Connections: Not on file  Depression (PHQ2-9): Low Risk (06/21/2024)   Depression (PHQ2-9)    PHQ-2 Score: 0  Alcohol Screen: Not on file  Housing: Unknown (03/12/2024)   Received from [facility]   Epic    Unable to Pay for Housing in the Last Year: Not on file    Number of Times Moved in the Last Year: Not on file    At any time in the past 12 months, were you homeless or living in a shelter (including now)?: No  Utilities: Not on file  Health Literacy: Not on file    Current  Medications: Current Medications[1]  Review of Systems: + Fatigue, weight changes, diarrhea, frequency, joint pain Denies appetite changes, fevers, chills. Denies hearing loss, neck lumps or masses, mouth  sores, ringing in ears or voice changes. Denies cough or wheezing.  Denies shortness of breath. Denies chest pain or palpitations. Denies leg swelling. Denies abdominal distention, pain, blood in stools, constipation, nausea, vomiting, or early satiety. Denies pain with intercourse, dysuria, hematuria or incontinence. Denies hot flashes, pelvic pain, vaginal bleeding or vaginal discharge.   Denies back pain or muscle pain/cramps. Denies itching, rash, or wounds. Denies dizziness, headaches, numbness or seizures. Denies swollen lymph nodes or glands, denies easy bruising or bleeding. Denies anxiety, depression, confusion, or decreased concentration.  Physical Exam: BP 125/81 (BP Location: Right Arm, Patient Position: Sitting)   Pulse 98   Temp 99 F (37.2 C) (Oral)   Resp 19   Wt 120 lb 3.2 oz (54.5 kg)   SpO2 99%   BMI 19.40 kg/m  General: Alert, oriented, no acute distress. HEENT: Normocephalic, atraumatic, sclera anicteric. Chest: Clear to auscultation bilaterally.  No wheezes or rhonchi. Cardiovascular: Regular rate and rhythm, no murmurs. Abdomen: soft, nontender.  Normoactive bowel sounds.  No masses or hepatosplenomegaly appreciated.  Well-healed incisions. Extremities: Grossly normal range of motion.  Warm, well perfused.  No edema bilaterally.  Skin: No rashes or lesions noted. Lymphatics: No cervical, supraclavicular, or inguinal adenopathy. GU: Normal appearing external genitalia without erythema, excoriation, or lesions.  Speculum exam reveals well rugated vaginal mucosa, no lesions or masses.  Cervix is normal in appearance, somewhat posterior facing.  Blood within the vaginal vault.  Bimanual exam reveals small mobile uterus, no adnexal masses appreciated, no  nodularity.  Laboratory & Radiologic Studies: Component Ref Range & Units (hover) 3 yr ago (02/13/21) 4 yr ago (08/18/20) 4 yr ago (02/20/20) 4 yr ago (11/22/19)  Inhibin B 48.0 71.0 CM 14.9 CM 83.0 CM   Component Ref Range & Units (hover) 7 mo ago (02/23/24) 1 yr ago (08/19/23) 1 yr ago (02/25/23) 2 yr ago (08/25/22) 2 yr ago (01/11/22) 3 yr ago (08/10/21) 3 yr ago (02/13/21)  ANTI-MULLERIAN HORMONE (AMH) 1.21 1.39 CM 1.02 CM 1.33 CM 1.70 CM 0.471 CM 2.57   Assessment & Plan: Kathryn Park is a 38 y.o. 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 Last ultrasound 08/2022: normal right ovary. Pelvic ultrasound 08/2023: Normal uterus, endometrium 9 mm, normal-appearing right ovary with 1.9 cm dominant anechoic follicle.   Doing well.  Continues to struggle with ataxia and other symptoms suspected to be related to EDS.   Patient is continuing to struggle with symptoms that are cyclic in nature related to her menses.  We have tried progesterone previously without any significant change in symptoms and the addition of others.  I brought up the possibility of completion surgery with removal of her remaining tube and ovary, possible concurrent hysterectomy at the same time.  I think it may be easier to have her on a steady state amount of estrogen with hormone replacement therapy then have the changes that she is currently experiencing with cyclic hormones.  Patient would like to move forward with scheduling surgery in the spring.   We reviewed signs and symptoms again that would be concerning for disease recurrence.  She knows to call if she develops any symptoms before her next scheduled visit.    I will see her back in the spring prior to her surgical date.  24 minutes of total time was spent for this patient encounter, including preparation, face-to-face counseling with the patient and coordination of care, and documentation  of  the encounter.  Comer Dollar, MD  Division of Gynecologic Oncology  Department of Obstetrics and Gynecology  [facility]      [1]  Current Outpatient Medications:    albuterol  (VENTOLIN  HFA) 108 (90 Base) MCG/ACT inhaler, Inhale 2 puffs into the lungs every 4 (four) hours as needed for wheezing or shortness of breath., Disp: 18 g, Rfl: 1   beclomethasone (QVAR) 80 MCG/ACT inhaler, Inhale 2 puffs into the lungs 2 (two) times daily. Rinse mouth after each use., Disp: 3 each, Rfl: 3   cetirizine (ZYRTEC) 5 MG tablet, Take 5 mg by mouth daily., Disp: , Rfl:    cromolyn  (GASTROCROM ) 100 MG/5ML solution, Take 5 mLs (100 mg total) by mouth 4 (four) times daily -  before meals and at bedtime., Disp: 480 mL, Rfl: 3   cyanocobalamin  1000 MCG tablet, Take 1,000 mcg by mouth daily. (Patient not taking: Reported on 08/02/2024), Disp: , Rfl:    EPINEPHrine  0.3 mg/0.3 mL IJ SOAJ injection, Inject 0.3 mg into the muscle as needed for anaphylaxis., Disp: 2 each, Rfl: 1   escitalopram  (LEXAPRO ) 10 MG tablet, Take 1/2 tablet (5 mg) daily., Disp: 45 tablet, Rfl: 3   famotidine  (PEPCID ) 20 MG tablet, Take 1 tablet (20 mg total) by mouth 2 (two) times daily., Disp: 60 tablet, Rfl: 3   fexofenadine (ALLEGRA) 180 MG tablet, Take 180 mg by mouth in the morning and at bedtime., Disp: , Rfl:    Flaxseed, Linseed, (FLAXSEED OIL PO), 1 capsule by Other route daily., Disp: , Rfl:    magnesium oxide (MAG-OX) 400 (240 Mg) MG tablet, Take 400 mg by mouth daily., Disp: , Rfl:    Melatonin 10 MG TABS, Take 1 tablet by mouth at bedtime., Disp: , Rfl:    Naltrexone  POWD, Take 2.5 mg by mouth daily., Disp: 100 g, Rfl: 12   ondansetron  (ZOFRAN ) 4 MG tablet, Take 1 tablet (4 mg total) by mouth 3 (three) times daily as needed for nausea or vomiting., Disp: 30 tablet, Rfl: 1   QUERCETIN PO, Take 1 capsule by mouth daily in the afternoon., Disp: , Rfl:    valACYclovir  (VALTREX ) 1000 MG tablet, Take  2 tablets (2000 mg) by mouth twice a day for 24 hours as needed for cold sore., Disp: 30 tablet, Rfl: 1   Vitamin D -Vitamin K (D3 + K2 PO), Take by mouth. One daily, Disp: , Rfl:

## 2024-09-25 ENCOUNTER — Telehealth: Payer: Self-pay | Admitting: *Deleted

## 2024-09-25 ENCOUNTER — Ambulatory Visit: Payer: Self-pay | Admitting: Gynecologic Oncology

## 2024-09-25 LAB — INHIBIN B: Inhibin B: 131.2 pg/mL

## 2024-09-25 NOTE — Telephone Encounter (Signed)
 Called and gave the patient new pre op appt dates

## 2024-09-28 LAB — ANTI MULLERIAN HORMONE: ANTI-MULLERIAN HORMONE (AMH): 0.526 ng/mL

## 2024-10-02 ENCOUNTER — Other Ambulatory Visit: Payer: Self-pay | Admitting: Gynecologic Oncology

## 2024-10-02 DIAGNOSIS — D3912 Neoplasm of uncertain behavior of left ovary: Secondary | ICD-10-CM

## 2024-10-11 ENCOUNTER — Ambulatory Visit (INDEPENDENT_AMBULATORY_CARE_PROVIDER_SITE_OTHER): Admitting: Family Medicine

## 2024-10-11 ENCOUNTER — Other Ambulatory Visit: Payer: Self-pay

## 2024-10-11 ENCOUNTER — Encounter: Payer: Self-pay | Admitting: Family Medicine

## 2024-10-11 VITALS — BP 126/78 | HR 88 | Ht 66.0 in | Wt 127.0 lb

## 2024-10-11 DIAGNOSIS — M25511 Pain in right shoulder: Secondary | ICD-10-CM | POA: Diagnosis not present

## 2024-10-11 DIAGNOSIS — Q796 Ehlers-Danlos syndrome, unspecified: Secondary | ICD-10-CM | POA: Diagnosis not present

## 2024-10-11 DIAGNOSIS — G8929 Other chronic pain: Secondary | ICD-10-CM | POA: Diagnosis not present

## 2024-10-11 DIAGNOSIS — G901 Familial dysautonomia [Riley-Day]: Secondary | ICD-10-CM

## 2024-10-11 MED ORDER — PROPRANOLOL HCL 10 MG PO TABS: 10.0000 mg | ORAL_TABLET | Freq: Three times a day (TID) | ORAL | 3 refills | Status: AC

## 2024-10-11 MED ORDER — PROPRANOLOL HCL 10 MG PO TABS: 10.0000 mg | ORAL_TABLET | Freq: Three times a day (TID) | ORAL | 3 refills | Status: DC

## 2024-10-11 MED ORDER — NALTREXONE POWD: 2.5000 mg | Freq: Every day | 12 refills | Status: AC

## 2024-10-11 NOTE — Patient Instructions (Addendum)
 Thank you for coming in today.   We are ordering an MRI for you today.  The imaging office will be calling you to schedule your appointment after we obtain authorization from your insurance company.  If you have not heard from [facility] within a week, please call [phone] to schedule your MRI.   Please be sure you have signed up for MyChart so that we can get your results to you.  We will be in touch with you as soon as we can.  Please know, it can take up to 3-4 business days for the radiologist and Dr. Joane to have time to review the results and determine the best plan of care.  If there is something that appears to be surgical or needs a referral to other specialists we will let you know through MyChart or telephone.  Otherwise we will plan to schedule a follow up appointment with Dr. Joane once we have the results.    Start Propranolol  for POTS and abd migraine sx. Continue Naltrexone .   See you back to review MRI results.

## 2024-10-11 NOTE — Progress Notes (Signed)
 "        I, Kathryn Park, CMA acting as a scribe for Kathryn Lloyd, MD.  Kathryn Park is a 39 y.o. female who presents to [facility] today for cont'd R shoulder pain in the setting of hypermobility. Pt was last seen by Dr. Lloyd on 06/25/24 and was given a R subacromial steroid injection and a Toradol  IM injection. Also prescribed naltrexone .  Today, pt reports some improvement with steroid injection for a couple of months but has noticed some sharp pain since October. Was able to move the shoulder more and feels that this has contributed to the increased pain. On bad days, has difficulty with ALD's. Mechanical sx present. Notes n/t in bilat 1st-3rd digits. Intermittent weakness and decreased grip strength. Taking LDN and Tylenol . LDN has been helpful for pain. Also using heat and ice. Pain worse with overhead reaching and extension.   Dx testing: 06/08/23 R shoulder XR   Pertinent review of systems: No fevers or chills  Relevant historical information: Ehlers-Danlos syndrome and history of a granulosa cell tumor of the left ovary.   Kathryn Park is scheduled for hysterectomy salpingectomy and oophorectomy on March 2026   Exam:  BP 126/78   Pulse 88   Ht 5' 6 (1.676 m)   Wt 127 lb (57.6 kg)   SpO2 99%   BMI 20.50 kg/m  General: Well Developed, well nourished, and in no acute distress.   MSK: Right shoulder excessive range of motion.  Positive O'Brien's test intact strength.    Lab and Radiology Results No results found for this or any previous visit (from the past 72 hours). No results found.     Assessment and Plan: 39 y.o. female with right shoulder pain.  This is a chronic ongoing issue with mechanical symptoms and instability.  She is already had a good trial of physical therapy which unfortunately did not work well enough.  Plan for MRI arthrogram for potential surgical planning.  Anticipate referral to orthopedic surgery to discuss  options after MRI.  Of note she will have to plan around her upcoming hysterectomy in March.  Additionally we talked about options.  She is also experiencing POTS symptoms and flushing and rapid heart rate.  Plan to use low-dose propranolol  10 mg up to 3 times a day.  Increase to extended release based on tolerance.  Overall pain and fatigue has been improved with low-dose naltrexone .  Plan to continue 2.5 mg dose. PDMP not reviewed this encounter. Orders Placed This Encounter  Procedures   MR Shoulder Right W Contrast    Standing Status:   Future    Expiration Date:   10/11/2025    Reason for Exam (SYMPTOM  OR DIAGNOSIS REQUIRED):   right shoulder pain and instability    If indicated for the ordered procedure, I authorize the administration of contrast media per Radiology protocol:   Yes    What is the patient's sedation requirement?:   No Sedation    Does the patient have a pacemaker or implanted devices?:   No    Preferred imaging location?:   [facility] (table limit-550lbs)   DG FLUORO GUIDED NEEDLE PLC ASPIRATION/INJECTION LOC    Standing Status:   Future    Expiration Date:   12/09/2025    Lab orders requested (DO NOT place separate lab orders, these will be automatically ordered during procedure specimen collection)::   None    Reason for Exam (SYMPTOM  OR  DIAGNOSIS REQUIRED):   right shoulder pain    Preferred imaging location?:   [facility]    Is the patient pregnant?:   No   Meds ordered this encounter  Medications   Naltrexone  POWD    Sig: Take 2.5 mg by mouth daily.    Dispense:  100 g    Refill:  12   DISCONTD: propranolol  (INDERAL ) 10 MG tablet    Sig: Take 1 tablet (10 mg total) by mouth 3 (three) times daily.    Dispense:  90 tablet    Refill:  3   propranolol  (INDERAL ) 10 MG tablet    Sig: Take 1 tablet (10 mg total) by mouth 3 (three) times daily.    Dispense:  90 tablet    Refill:  3     Discussed warning signs or symptoms. Please see discharge  instructions. Patient expresses understanding.   The above documentation has been reviewed and is accurate and complete Kathryn Park, M.D.   "

## 2024-10-12 ENCOUNTER — Inpatient Hospital Stay: Admission: RE | Admit: 2024-10-12 | Source: Ambulatory Visit

## 2024-10-24 ENCOUNTER — Ambulatory Visit
Admission: RE | Admit: 2024-10-24 | Discharge: 2024-10-24 | Disposition: A | Discharge status: Home / Self Care | Source: Ambulatory Visit | Attending: Obstetrics and Gynecology | Admitting: Obstetrics and Gynecology

## 2024-10-24 DIAGNOSIS — Z1231 Encounter for screening mammogram for malignant neoplasm of breast: Secondary | ICD-10-CM

## 2024-10-25 ENCOUNTER — Other Ambulatory Visit: Payer: Self-pay | Admitting: Obstetrics and Gynecology

## 2024-10-25 DIAGNOSIS — N6310 Unspecified lump in the right breast, unspecified quadrant: Secondary | ICD-10-CM

## 2024-11-02 ENCOUNTER — Ambulatory Visit
Admission: RE | Admit: 2024-11-02 | Discharge: 2024-11-02 | Disposition: A | Source: Ambulatory Visit | Attending: Family Medicine | Admitting: Family Medicine

## 2024-11-02 DIAGNOSIS — G8929 Other chronic pain: Secondary | ICD-10-CM

## 2024-11-02 MED ORDER — IOPAMIDOL (ISOVUE-M 200) INJECTION 41%
10.0000 mL | Freq: Once | INTRAMUSCULAR | Status: AC
  Administered 2024-11-02: 10 mL via INTRA_ARTICULAR

## 2024-11-02 MED ORDER — GADOBENATE DIMEGLUMINE 529 MG/ML IV SOLN
0.1000 mL | Freq: Once | INTRAVENOUS | Status: AC | PRN
  Administered 2024-11-02: 0.1 mL via INTRA_ARTICULAR

## 2024-11-05 ENCOUNTER — Other Ambulatory Visit

## 2024-11-05 ENCOUNTER — Encounter

## 2024-11-08 ENCOUNTER — Ambulatory Visit
Admission: RE | Admit: 2024-11-08 | Discharge: 2024-11-08 | Disposition: A | Source: Ambulatory Visit | Attending: Obstetrics and Gynecology | Admitting: Obstetrics and Gynecology

## 2024-11-08 ENCOUNTER — Ambulatory Visit: Payer: Self-pay | Admitting: Family Medicine

## 2024-11-08 DIAGNOSIS — N6310 Unspecified lump in the right breast, unspecified quadrant: Secondary | ICD-10-CM

## 2024-11-08 NOTE — Progress Notes (Signed)
 Good news. You do not have a labrum tear or a rotator cuff tear. You do have bursitis. I recommend you return to clinic so that we can do a cortisone injection which I think will help.

## 2024-11-22 ENCOUNTER — Ambulatory Visit (HOSPITAL_COMMUNITY)

## 2024-11-30 ENCOUNTER — Inpatient Hospital Stay: Admitting: Gynecologic Oncology

## 2024-12-10 ENCOUNTER — Ambulatory Visit: Admitting: Allergy

## 2024-12-26 ENCOUNTER — Ambulatory Visit (HOSPITAL_COMMUNITY): Admit: 2024-12-26 | Admitting: Gynecologic Oncology

## 2024-12-26 DIAGNOSIS — D3912 Neoplasm of uncertain behavior of left ovary: Secondary | ICD-10-CM

## 2024-12-26 SURGERY — HYSTERECTOMY, TOTAL, ROBOT-ASSISTED, LAPAROSCOPIC, WITH BILATERAL SALPINGO-OOPHORECTOMY
Anesthesia: General | Laterality: Right

## 2024-12-27 ENCOUNTER — Inpatient Hospital Stay: Admitting: Gynecologic Oncology

## 2025-01-24 ENCOUNTER — Inpatient Hospital Stay: Admitting: Gynecologic Oncology

## 2025-03-22 ENCOUNTER — Inpatient Hospital Stay: Admitting: Gynecologic Oncology

## 2025-03-22 ENCOUNTER — Inpatient Hospital Stay

## 2025-07-02 ENCOUNTER — Ambulatory Visit: Admitting: Obstetrics and Gynecology
